# Patient Record
Sex: Female | Born: 1955 | Race: Black or African American | Hispanic: No | Marital: Married | Smoking: Never smoker
Health system: Southern US, Community
[De-identification: ages and names within clinical notes are randomized; demographics above are authoritative.]

## PROBLEM LIST (undated history)

## (undated) DIAGNOSIS — R42 Dizziness and giddiness: Secondary | ICD-10-CM

## (undated) DIAGNOSIS — E669 Obesity, unspecified: Secondary | ICD-10-CM

## (undated) DIAGNOSIS — N2889 Other specified disorders of kidney and ureter: Secondary | ICD-10-CM

## (undated) DIAGNOSIS — D649 Anemia, unspecified: Secondary | ICD-10-CM

## (undated) DIAGNOSIS — J40 Bronchitis, not specified as acute or chronic: Secondary | ICD-10-CM

## (undated) DIAGNOSIS — I10 Essential (primary) hypertension: Secondary | ICD-10-CM

## (undated) DIAGNOSIS — M1990 Unspecified osteoarthritis, unspecified site: Secondary | ICD-10-CM

## (undated) DIAGNOSIS — K760 Fatty (change of) liver, not elsewhere classified: Secondary | ICD-10-CM

## (undated) DIAGNOSIS — E1121 Type 2 diabetes mellitus with diabetic nephropathy: Secondary | ICD-10-CM

## (undated) HISTORY — DX: Type 2 diabetes mellitus with diabetic nephropathy: E11.21

## (undated) HISTORY — DX: Fatty (change of) liver, not elsewhere classified: K76.0

## (undated) HISTORY — DX: Bronchitis, not specified as acute or chronic: J40

## (undated) HISTORY — DX: Obesity, unspecified: E66.9

## (undated) HISTORY — DX: Other specified disorders of kidney and ureter: N28.89

## (undated) HISTORY — PX: BREAST SURGERY: SHX581

---
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
## 2004-04-24 ENCOUNTER — Emergency Department (HOSPITAL_COMMUNITY): Admission: EM | Admit: 2004-04-24 | Discharge: 2004-04-24 | Payer: Self-pay | Admitting: Emergency Medicine

## 2005-05-27 ENCOUNTER — Emergency Department (HOSPITAL_COMMUNITY): Admission: EM | Admit: 2005-05-27 | Discharge: 2005-05-27 | Payer: Self-pay | Admitting: Emergency Medicine

## 2006-05-01 ENCOUNTER — Emergency Department (HOSPITAL_COMMUNITY): Admission: EM | Admit: 2006-05-01 | Discharge: 2006-05-02 | Payer: Self-pay | Admitting: Emergency Medicine

## 2008-12-14 ENCOUNTER — Emergency Department (HOSPITAL_COMMUNITY): Admission: EM | Admit: 2008-12-14 | Discharge: 2008-12-14 | Payer: Self-pay | Admitting: Emergency Medicine

## 2009-05-04 ENCOUNTER — Emergency Department (HOSPITAL_COMMUNITY): Admission: EM | Admit: 2009-05-04 | Discharge: 2009-05-04 | Payer: Self-pay | Admitting: Emergency Medicine

## 2010-01-25 ENCOUNTER — Emergency Department (HOSPITAL_COMMUNITY): Admission: EM | Admit: 2010-01-25 | Discharge: 2010-01-26 | Payer: Self-pay | Admitting: Emergency Medicine

## 2010-05-18 LAB — DIFFERENTIAL
Eosinophils Absolute: 0.2 10*3/uL (ref 0.0–0.7)
Lymphocytes Relative: 41 % (ref 12–46)
Lymphs Abs: 3.7 10*3/uL (ref 0.7–4.0)
Monocytes Absolute: 0.6 10*3/uL (ref 0.1–1.0)
Neutro Abs: 4.4 10*3/uL (ref 1.7–7.7)

## 2010-05-18 LAB — URINALYSIS, ROUTINE W REFLEX MICROSCOPIC
Ketones, ur: NEGATIVE mg/dL
Protein, ur: NEGATIVE mg/dL
Urobilinogen, UA: 0.2 mg/dL (ref 0.0–1.0)

## 2010-05-18 LAB — CBC
MCH: 27.8 pg (ref 26.0–34.0)
MCHC: 33.4 g/dL (ref 30.0–36.0)
MCV: 83.2 fL (ref 78.0–100.0)
Platelets: 327 10*3/uL (ref 150–400)
RBC: 4.64 MIL/uL (ref 3.87–5.11)
RDW: 13.8 % (ref 11.5–15.5)

## 2010-05-18 LAB — COMPREHENSIVE METABOLIC PANEL
ALT: 27 U/L (ref 0–35)
Alkaline Phosphatase: 69 U/L (ref 39–117)
BUN: 22 mg/dL (ref 6–23)
Chloride: 101 mEq/L (ref 96–112)
GFR calc non Af Amer: 41 mL/min — ABNORMAL LOW (ref >60)
Sodium: 139 mEq/L (ref 135–145)
Total Bilirubin: 0.5 mg/dL (ref 0.3–1.2)

## 2010-05-18 LAB — URINE CULTURE
Colony Count: NO GROWTH
Culture  Setup Time: 201111221210
Culture: NO GROWTH

## 2010-05-18 LAB — URINE MICROSCOPIC-ADD ON

## 2010-05-18 LAB — POCT CARDIAC MARKERS

## 2010-05-26 LAB — BASIC METABOLIC PANEL
BUN: 14 mg/dL (ref 6–23)
Chloride: 103 mEq/L (ref 96–112)
Creatinine, Ser: 1.19 mg/dL (ref 0.4–1.2)
GFR calc Af Amer: 57 mL/min — ABNORMAL LOW (ref >60)
GFR calc non Af Amer: 47 mL/min — ABNORMAL LOW (ref >60)
Potassium: 4 mEq/L (ref 3.5–5.1)
Sodium: 139 mEq/L (ref 135–145)

## 2010-06-10 LAB — URINALYSIS, ROUTINE W REFLEX MICROSCOPIC
Glucose, UA: NEGATIVE mg/dL
Ketones, ur: NEGATIVE mg/dL
Leukocytes, UA: NEGATIVE
Protein, ur: 100 mg/dL — AB

## 2010-06-10 LAB — URINE MICROSCOPIC-ADD ON

## 2010-06-10 LAB — GLUCOSE, CAPILLARY: Glucose-Capillary: 136 mg/dL — ABNORMAL HIGH (ref 70–99)

## 2010-06-10 LAB — URINE CULTURE

## 2010-11-06 ENCOUNTER — Emergency Department (HOSPITAL_COMMUNITY)
Admission: EM | Admit: 2010-11-06 | Discharge: 2010-11-06 | Disposition: A | Discharge status: Home / Self Care | Payer: Self-pay | Attending: Emergency Medicine | Admitting: Emergency Medicine

## 2010-11-06 DIAGNOSIS — R51 Headache: Secondary | ICD-10-CM | POA: Insufficient documentation

## 2010-11-06 DIAGNOSIS — Z91199 Patient's noncompliance with other medical treatment and regimen due to unspecified reason: Secondary | ICD-10-CM | POA: Insufficient documentation

## 2010-11-06 DIAGNOSIS — Z9119 Patient's noncompliance with other medical treatment and regimen: Secondary | ICD-10-CM | POA: Insufficient documentation

## 2010-11-06 DIAGNOSIS — I10 Essential (primary) hypertension: Secondary | ICD-10-CM | POA: Insufficient documentation

## 2010-11-06 DIAGNOSIS — E119 Type 2 diabetes mellitus without complications: Secondary | ICD-10-CM | POA: Insufficient documentation

## 2010-11-06 HISTORY — DX: Essential (primary) hypertension: I10

## 2010-11-06 MED ORDER — LISINOPRIL-HYDROCHLOROTHIAZIDE 20-25 MG PO TABS: 1.0000 | ORAL_TABLET | Freq: Every day | ORAL | Status: DC

## 2010-11-06 MED ORDER — IBUPROFEN 600 MG PO TABS: 600.0000 mg | ORAL_TABLET | Freq: Three times a day (TID) | ORAL | Status: AC | PRN

## 2010-11-06 MED ORDER — IBUPROFEN 800 MG PO TABS
800.0000 mg | ORAL_TABLET | Freq: Once | ORAL | Status: AC
Start: 2010-11-06 — End: 2010-11-06
  Administered 2010-11-06: 800 mg via ORAL
  Filled 2010-11-06: qty 1

## 2010-11-06 NOTE — ED Notes (Signed)
Headache for 1 day

## 2010-11-12 NOTE — ED Provider Notes (Signed)
History     CSN: [id number] Arrival date & time: 11/06/2010  1:08 AM  Chief Complaint  Patient presents with  . Headache   HPI Comments: The patient presents for evaluation of headache that only hurts when she coughs.   She reports a dry, non-productive cough for several days without fever, chills, dyspnea, or chest pain. She also is requesting medication refills of her blood pressure medications.  Patient is a 55 y.o. female presenting with headaches. The history is provided by the patient.  Headache  This is a new problem. Episode onset: today. The problem occurs every few minutes. The problem has not changed since onset.The headache is associated with coughing. The pain is located in the bilateral, frontal, temporal, occipital and parietal region. The quality of the pain is described as dull. The pain is at a severity of 5/10. The pain is moderate. The pain does not radiate. Pertinent negatives include no anorexia, no fever, no malaise/fatigue, no chest pressure, no near-syncope, no orthopnea, no palpitations, no syncope, no shortness of breath, no nausea and no vomiting. She has tried nothing for the symptoms.    Past Medical History  Diagnosis Date  . Diabetes mellitus   . Hypertension     History reviewed. No pertinent past surgical history.  No family history on file.  History  Substance Use Topics  . Smoking status: Never Smoker   . Smokeless tobacco: Not on file  . Alcohol Use: No    OB History    Grav Para Term Preterm Abortions TAB SAB Ect Mult Living                  Review of Systems  Constitutional: Negative for fever, chills, malaise/fatigue, diaphoresis and appetite change.  HENT: Negative for hearing loss, ear pain, congestion, sore throat, facial swelling, rhinorrhea, trouble swallowing, neck pain, neck stiffness, dental problem, sinus pressure and tinnitus.   Eyes: Negative for photophobia, pain, discharge, redness and visual disturbance.  Respiratory:  Positive for cough. Negative for shortness of breath.   Cardiovascular: Negative.  Negative for palpitations, orthopnea, syncope and near-syncope.  Gastrointestinal: Negative for nausea, vomiting and anorexia.  Genitourinary: Negative.   Musculoskeletal: Negative for back pain.  Skin: Negative for rash.  Neurological: Positive for headaches. Negative for dizziness, seizures, syncope, light-headedness and numbness.  Psychiatric/Behavioral: Negative for confusion.    Physical Exam  BP 169/68  Pulse 78  Temp(Src) 97.8 F (36.6 C) (Oral)  Resp 20  Ht 5\' 5"  (1.651 m)  Wt 240 lb (108.863 kg)  BMI 39.94 kg/m2  SpO2 100%  Physical Exam  Constitutional: She is oriented to person, place, and time. She appears well-developed and well-nourished. No distress.  HENT:  Head: Normocephalic and atraumatic.  Right Ear: External ear normal.  Left Ear: External ear normal.  Mouth/Throat: Oropharynx is clear and moist. No oropharyngeal exudate.  Eyes: Conjunctivae and EOM are normal. Pupils are equal, round, and reactive to light. Right eye exhibits no nystagmus. Left eye exhibits no nystagmus.  Fundoscopic exam:      The right eye shows no papilledema.       The left eye shows no papilledema.  Neck: Normal range of motion, full passive range of motion without pain and phonation normal. Neck supple. Carotid bruit is not present.  Cardiovascular: Normal rate, regular rhythm, normal heart sounds and intact distal pulses.  Exam reveals no gallop and no friction rub.   No murmur heard. Pulmonary/Chest: Effort normal and breath sounds normal. No  respiratory distress. She has no wheezes. She has no rales.  Abdominal: Soft. Bowel sounds are normal. She exhibits no distension. There is no tenderness. There is no rebound and no guarding.  Musculoskeletal: Normal range of motion. She exhibits no edema and no tenderness.  Neurological: She is alert and oriented to person, place, and time. She has normal  reflexes. No cranial nerve deficit. She exhibits normal muscle tone. Coordination normal. GCS eye subscore is 4. GCS verbal subscore is 5. GCS motor subscore is 6.  Skin: Skin is warm and dry. No rash noted. She is not diaphoretic.  Psychiatric: She has a normal mood and affect. Her behavior is normal. Judgment and thought content normal.    ED Course  Procedures  MDM Tension headache, hypertensive headache, viral upper respiratory infection, bronchitis, hypertension, medication non-compliance.      Felisa Bonier, MD 11/12/10 [phone]

## 2011-08-20 ENCOUNTER — Encounter (HOSPITAL_COMMUNITY): Payer: Self-pay | Admitting: *Deleted

## 2011-08-20 ENCOUNTER — Emergency Department (HOSPITAL_COMMUNITY)
Admission: EM | Admit: 2011-08-20 | Discharge: 2011-08-21 | Disposition: A | Discharge status: Home / Self Care | Payer: Self-pay | Attending: Emergency Medicine | Admitting: Emergency Medicine

## 2011-08-20 DIAGNOSIS — E119 Type 2 diabetes mellitus without complications: Secondary | ICD-10-CM | POA: Insufficient documentation

## 2011-08-20 DIAGNOSIS — R739 Hyperglycemia, unspecified: Secondary | ICD-10-CM

## 2011-08-20 DIAGNOSIS — I10 Essential (primary) hypertension: Secondary | ICD-10-CM | POA: Insufficient documentation

## 2011-08-20 DIAGNOSIS — N289 Disorder of kidney and ureter, unspecified: Secondary | ICD-10-CM | POA: Insufficient documentation

## 2011-08-20 DIAGNOSIS — N390 Urinary tract infection, site not specified: Secondary | ICD-10-CM | POA: Insufficient documentation

## 2011-08-20 LAB — BASIC METABOLIC PANEL
Calcium: 9.8 mg/dL (ref 8.4–10.5)
GFR calc Af Amer: 37 mL/min — ABNORMAL LOW (ref >90)
GFR calc non Af Amer: 32 mL/min — ABNORMAL LOW (ref >90)
Glucose, Bld: 193 mg/dL — ABNORMAL HIGH (ref 70–99)
Sodium: 138 mEq/L (ref 135–145)

## 2011-08-20 LAB — URINALYSIS, ROUTINE W REFLEX MICROSCOPIC
Glucose, UA: NEGATIVE mg/dL
Ketones, ur: NEGATIVE mg/dL
Nitrite: POSITIVE — AB
Specific Gravity, Urine: >1.03 — ABNORMAL HIGH (ref 1.005–1.030)
pH: 6 (ref 5.0–8.0)

## 2011-08-20 LAB — CBC
HCT: 39 % (ref 36.0–46.0)
MCH: 26.5 pg (ref 26.0–34.0)
MCHC: 31.8 g/dL (ref 30.0–36.0)
MCV: 83.3 fL (ref 78.0–100.0)
RBC: 4.68 MIL/uL (ref 3.87–5.11)
RDW: 14.7 % (ref 11.5–15.5)

## 2011-08-20 LAB — URINE MICROSCOPIC-ADD ON

## 2011-08-20 NOTE — ED Provider Notes (Signed)
History     CSN: [id number]  Arrival date & time 08/20/11  2153   None     Chief Complaint  Patient presents with  . Dizziness    (Consider location/radiation/quality/duration/timing/severity/associated sxs/prior treatment) HPI Comments: Had an episode of dizziness this PM which occurred after standing from a sitting  And lasted less than one minute.  No accompanying diaphoresis, nausea or vomiting.  No CP.  No fever/chills, cough or earache.  States she has had frequent episodes  Like this for the past 4 years.  No confusion, aphasia or difficulty ambulating.  No head trauma.  The history is provided by the patient. No language interpreter was used.    Past Medical History  Diagnosis Date  . Diabetes mellitus   . Hypertension     History reviewed. No pertinent past surgical history.  No family history on file.  History  Substance Use Topics  . Smoking status: Never Smoker   . Smokeless tobacco: Not on file  . Alcohol Use: No    OB History    Grav Para Term Preterm Abortions TAB SAB Ect Mult Living                  Review of Systems  Constitutional: Negative for fever, chills and diaphoresis.  HENT: Negative for hearing loss, ear pain, trouble swallowing, neck pain, neck stiffness, tinnitus and ear discharge.   Eyes: Negative for visual disturbance.  Respiratory: Negative for cough, chest tightness and shortness of breath.   Gastrointestinal: Negative for nausea, vomiting and diarrhea.  Genitourinary: Negative for dysuria, urgency, frequency, hematuria and difficulty urinating.  Hematological: Does not bruise/bleed easily.  Psychiatric/Behavioral: Negative for confusion and decreased concentration. The patient is not nervous/anxious.   All other systems reviewed and are negative.    Allergies  Review of patient's allergies indicates no known allergies.  Home Medications   Current Outpatient Rx  Name Route Sig Dispense Refill  . ATENOLOL 100 MG PO TABS  Oral Take 100 mg by mouth daily.      Marland Kitchen LISINOPRIL-HYDROCHLOROTHIAZIDE 20-25 MG PO TABS Oral Take 1 tablet by mouth daily. 30 tablet 0  . PIOGLITAZONE HCL 45 MG PO TABS Oral Take 45 mg by mouth daily.      Marland Kitchen SITAGLIPTIN PHOSPHATE 100 MG PO TABS Oral Take 100 mg by mouth daily.      Marland Kitchen CIPROFLOXACIN HCL 500 MG PO TABS Oral Take 0.5 tablets (250 mg total) by mouth 2 (two) times daily. 14 tablet 0    BP 135/66  Pulse 80  Temp 98 F (36.7 C) (Oral)  Resp 20  Ht 5\' 5"  (1.651 m)  Wt 230 lb (104.327 kg)  BMI 38.27 kg/m2  SpO2 100%  Physical Exam  Nursing note and vitals reviewed. Constitutional: She is oriented to person, place, and time. She appears well-developed and well-nourished. No distress.  HENT:  Head: Normocephalic and atraumatic.  Eyes: EOM are normal. Pupils are equal, round, and reactive to light.  Neck: Trachea normal, normal range of motion and phonation normal. No JVD present. Carotid bruit is not present.  Cardiovascular: Normal rate, regular rhythm and normal heart sounds.   Pulmonary/Chest: Effort normal and breath sounds normal. No accessory muscle usage. Not tachypneic. No respiratory distress. She has no decreased breath sounds. She has no wheezes. She has no rhonchi. She has no rales. She exhibits no tenderness.  Abdominal: Soft. She exhibits no distension. There is no tenderness.  Musculoskeletal: Normal range of motion.  Neurological:  She is alert and oriented to person, place, and time. She has normal strength. No cranial nerve deficit. Coordination and gait normal. GCS eye subscore is 4. GCS verbal subscore is 5. GCS motor subscore is 6.  Reflex Scores:      Bicep reflexes are 2+ on the right side and 2+ on the left side.      Brachioradialis reflexes are 2+ on the right side and 2+ on the left side.      Patellar reflexes are 2+ on the right side and 2+ on the left side.      Achilles reflexes are 2+ on the right side and 2+ on the left side. Skin: Skin is warm  and dry.  Psychiatric: She has a normal mood and affect. Judgment normal.    ED Course  Procedures (including critical care time)  Labs Reviewed  CBC - Abnormal; Notable for the following:    Platelets 439 (*)     All other components within normal limits  BASIC METABOLIC PANEL - Abnormal; Notable for the following:    Glucose, Bld 193 (*)     BUN 27 (*)     Creatinine, Ser 1.70 (*)     GFR calc non Af Amer 32 (*)     GFR calc Af Amer 37 (*)     All other components within normal limits  URINALYSIS, ROUTINE W REFLEX MICROSCOPIC - Abnormal; Notable for the following:    APPearance CLOUDY (*)     Specific Gravity, Urine >1.030 (*)     Hgb urine dipstick TRACE (*)     Nitrite POSITIVE (*)     Leukocytes, UA MODERATE (*)     All other components within normal limits  URINE MICROSCOPIC-ADD ON - Abnormal; Notable for the following:    Bacteria, UA MANY (*)     All other components within normal limits  URINE CULTURE   No results found.   1. UTI (urinary tract infection)   2. Renal insufficiency   3. Hyperglycemia       MDM  rx-cipro 250 mg BID, 14 Drink plenty of fluids.   F/u with your PCP at [facility] on Monday.        Worthy Rancher, PA 08/21/11 [phone]

## 2011-08-20 NOTE — ED Notes (Signed)
Patient states she has been dizzy for 3 years . She [facility] to health dept[facility]  And states she was given antivert at a previous visit here in er

## 2011-08-20 NOTE — ED Notes (Signed)
C/o dizziness, states she has a history of dizziness but it ia worse today

## 2011-08-21 MED ORDER — CIPROFLOXACIN HCL 500 MG PO TABS: 250.0000 mg | ORAL_TABLET | Freq: Two times a day (BID) | ORAL | Status: AC

## 2011-08-21 MED ORDER — CIPROFLOXACIN HCL 250 MG PO TABS
500.0000 mg | ORAL_TABLET | Freq: Once | ORAL | Status: AC
  Administered 2011-08-21: 500 mg via ORAL
  Filled 2011-08-21: qty 2

## 2011-08-21 NOTE — Discharge Instructions (Signed)
Chronic Renal Insufficiency Chronic renal insufficiency (also called kidney failure) occurs when there is kidney damage done. The damage prevents the kidneys from working like they should.  The kidneys do many important things. They:  Filter waste out of the blood.   Regulate the amount of water and various salts in the blood stream.   Produce chemicals that:   Prompt the bone marrow to make red blood cells.   Regulate blood pressure.   Keep calcium in balance throughout the bones and the body.  When the kidneys are damaged, they can no longer filter waste products out of the blood. These substances build up in the blood, causing illness.  CAUSES   Diabetes.   High blood pressure.   Glomerular diseases: Conditions that damage the tiny blood vessels (glomeruli) within the kidneys, such as:   Membranous nephropathy.   IgA nephropathy.   Focal segmental glomerulosclerosis.   Poisons (such as overdoses or misuse of acetaminophen or NSAIDS, or exposure to other toxic substances).   Kidney injuries.   Kidney cancer or cancer that spreads to the kidney.   Medications such as NSAIDs. These problems are rare.   Kidney stones.   Alport disease.   Polycystic kidneys.  SYMPTOMS  Most people do not notice symptoms of kidney failure until their kidney function drops below about 30-40% of normal. Symptoms can include:  Weakness.   Tiredness.   Frequent urination.   Intense need to urinate.   Excess bruising.   Low urine production.   Blood in the urine.   Pain in the kidney area.   Feeling sick to your stomach (nausea).   Vomiting.   Unusual bleeding.   Numbness in hands and feet.   Swelling in legs, arms and face.   Confusion.  DIAGNOSIS  Your caregiver will look for signs of kidney failure. Tests to diagnose kidney failure may include:  Urine tests: May reveal the presence of blood, protein or sugar.   Blood tests: May show low red blood cell count  (anemia) or high levels of waste products (BUN and creatinine) that are normally filtered out of the bloodstream by the kidneys.   Imaging tests - These are tests that create pictures of the organs inside the abdomen, such as the kidneys. They may reveal masses growing in the kidneys or blockages to the flow of urine. Possible imaging tests may include:   Ultrasound.   CT scan.   MRI.   Intravenous pyelogram or IVP. This is a test that involves injecting dye into the bloodstream and then taking a series of x-rays of the kidneys. This allows the kidneys and other parts of the urinary system to be viewed more clearly.   Kidney biopsy - A small sample of kidney is removed using a special needle. The sample is examined for abnormalities under a microscope.  TREATMENT  Chronic kidney failure cannot usually be cured. The various symptoms are treated, and measures are taken to avoid further kidney damage. Treatment for mild to moderate kidney failure may include:  Medication for high blood pressure.   Good control of diabetes.   Medication and diet change to improve anemia.   A low-sodium, low-potassium, low-protein and/or low-cholesterol diet.   Limiting the quantity of liquids in the diet.  Treatment for more severe kidney failure may require:  Dialysis - Mechanical methods of filtering the blood.   Kidney transplant - An operation that removes the diseased kidney and replaces it with a donated kidney.  HOME  CARE INSTRUCTIONS   Take medication as told by your caregiver.   Quit smoking if you are a smoker. Talk to your caregiver about a smoking cessation program.   Follow your prescribed diet.   If you are prescribed vitamins, take them as told.  SEEK IMMEDIATE MEDICAL CARE IF:  You start to produce less urine.   You notice blood in your urine.   You have increased pain.   You have increased weakness, fatigue or confusion.   You notice new swelling.   You develop a  fever.   You feel that you are having side effects of medicines prescribed.  Document Released: 12/01/2007 Document Revised: 02/10/2011 Document Reviewed: 03/15/2010 [facility] Patient Information 2012 [place], [place].Urinary Tract Infection Infections of the urinary tract can start in several places. A bladder infection (cystitis), a kidney infection (pyelonephritis), and a prostate infection (prostatitis) are different types of urinary tract infections (UTIs). They usually get better if treated with medicines (antibiotics) that kill germs. Take all the medicine until it is gone. You or your child may feel better in a few days, but TAKE ALL MEDICINE or the infection may not respond and may become more difficult to treat. HOME CARE INSTRUCTIONS   Drink enough water and fluids to keep the urine clear or pale yellow. Cranberry juice is especially recommended, in addition to large amounts of water.   Avoid caffeine, tea, and carbonated beverages. They tend to irritate the bladder.   Alcohol may irritate the prostate.   Only take over-the-counter or prescription medicines for pain, discomfort, or fever as directed by your caregiver.  To prevent further infections:  Empty the bladder often. Avoid holding urine for long periods of time.   After a bowel movement, women should cleanse from front to back. Use each tissue only once.   Empty the bladder before and after sexual intercourse.  FINDING OUT THE RESULTS OF YOUR TEST Not all test results are available during your visit. If your or your child's test results are not back during the visit, make an appointment with your caregiver to find out the results. Do not assume everything is normal if you have not heard from your caregiver or the medical facility. It is important for you to follow up on all test results. SEEK MEDICAL CARE IF:   There is back pain.   Your baby is older than 3 months with a rectal temperature of 100.5 F (38.1 C) or higher  for more than 1 day.   Your or your child's problems (symptoms) are no better in 3 days. Return sooner if you or your child is getting worse.  SEEK IMMEDIATE MEDICAL CARE IF:   There is severe back pain or lower abdominal pain.   You or your child develops chills.   You have a fever.   Your baby is older than 3 months with a rectal temperature of 102 F (38.9 C) or higher.   Your baby is 78 months old or younger with a rectal temperature of 100.4 F (38 C) or higher.   There is nausea or vomiting.   There is continued burning or discomfort with urination.  MAKE SURE YOU:   Understand these instructions.   Will watch your condition.   Will get help right away if you are not doing well or get worse.  Document Released: 12/01/2004 Document Revised: 02/10/2011 Document Reviewed: 07/06/2006 [facility] Patient Information 2012 [place], [place].   Take the cipro as directed and drink plenty of fluids.  Call your MD at the health dept. On Monday and  Let her know that you came to ED today.  Your BUN and creatinine are more elevated than the last time you were seen herr.

## 2011-08-21 NOTE — ED Provider Notes (Signed)
Medical screening examination/treatment/procedure(s) were performed by non-physician practitioner and as supervising physician I was immediately available for consultation/collaboration.  Nicoletta Dress. Colon Branch, MD 08/21/11 [phone]

## 2011-08-23 LAB — URINE CULTURE

## 2011-08-24 NOTE — ED Notes (Signed)
+   urine Patient treated with Cipro-sensitive to same-chart appended per protocol. 

## 2011-11-11 ENCOUNTER — Encounter (HOSPITAL_COMMUNITY): Payer: Self-pay | Admitting: *Deleted

## 2011-11-11 ENCOUNTER — Emergency Department (HOSPITAL_COMMUNITY)
Admission: EM | Admit: 2011-11-11 | Discharge: 2011-11-11 | Disposition: A | Discharge status: Home / Self Care | Payer: Self-pay | Attending: Emergency Medicine | Admitting: Emergency Medicine

## 2011-11-11 DIAGNOSIS — M5432 Sciatica, left side: Secondary | ICD-10-CM

## 2011-11-11 DIAGNOSIS — M5430 Sciatica, unspecified side: Secondary | ICD-10-CM | POA: Insufficient documentation

## 2011-11-11 DIAGNOSIS — I10 Essential (primary) hypertension: Secondary | ICD-10-CM | POA: Insufficient documentation

## 2011-11-11 DIAGNOSIS — E119 Type 2 diabetes mellitus without complications: Secondary | ICD-10-CM | POA: Insufficient documentation

## 2011-11-11 MED ORDER — HYDROCODONE-ACETAMINOPHEN 5-325 MG PO TABS: ORAL_TABLET | ORAL | Status: AC

## 2011-11-11 MED ORDER — CYCLOBENZAPRINE HCL 10 MG PO TABS: 10.0000 mg | ORAL_TABLET | Freq: Three times a day (TID) | ORAL | Status: AC | PRN

## 2011-11-11 NOTE — ED Provider Notes (Signed)
Medical screening examination/treatment/procedure(s) were performed by non-physician practitioner and as supervising physician I was immediately available for consultation/collaboration.   Esthela Brandner L Ryian Lynde, MD 11/11/11 2234 

## 2011-11-11 NOTE — ED Notes (Signed)
Pain lt hip and down leg x 2 weeks, No injury. Alert,

## 2011-11-11 NOTE — ED Provider Notes (Signed)
History     CSN: [id number]  Arrival date & time 11/11/11  1633   First MD Initiated Contact with Patient 11/11/11 1658      Chief Complaint  Patient presents with  . Hip Pain    (Consider location/radiation/quality/duration/timing/severity/associated sxs/prior treatment) HPI Comments: Patient c/o pain to her left hip/buttocks for 2 weeks.  States she noticed the pain after moving a heavy sofa.  Describes the pain as sharp and radiating from the hip down to her ankle.  States the pain improves when supine and worsens with standing, bending or excessive sitting.  She denies urinary symptoms, perineal numbness, extremity weakness or numbness or incontinence.    Patient is a 56 y.o. female presenting with hip pain. The history is provided by the patient.  Hip Pain This is a new problem. The current episode started 1 to 4 weeks ago. The problem occurs intermittently. The problem has been unchanged. Associated symptoms include arthralgias. Pertinent negatives include no abdominal pain, chest pain, chills, congestion, coughing, fever, headaches, joint swelling, myalgias, nausea, neck pain, numbness, rash, sore throat, urinary symptoms, vomiting or weakness. The symptoms are aggravated by standing, twisting, walking and bending. She has tried NSAIDs for the symptoms. The treatment provided moderate relief.    Past Medical History  Diagnosis Date  . Diabetes mellitus   . Hypertension     History reviewed. No pertinent past surgical history.  No family history on file.  History  Substance Use Topics  . Smoking status: Never Smoker   . Smokeless tobacco: Not on file  . Alcohol Use: No    OB History    Grav Para Term Preterm Abortions TAB SAB Ect Mult Living                  Review of Systems  Constitutional: Negative for fever and chills.  HENT: Negative for congestion, sore throat and neck pain.   Respiratory: Negative for cough and shortness of breath.   Cardiovascular:  Negative for chest pain.  Gastrointestinal: Negative for nausea, vomiting, abdominal pain and constipation.  Genitourinary: Negative for dysuria, frequency, hematuria, flank pain, decreased urine volume, vaginal bleeding, difficulty urinating and pelvic pain.       No perineal numbness or incontinence of urine or feces  Musculoskeletal: Positive for arthralgias. Negative for myalgias and joint swelling.  Skin: Negative for rash and wound.  Neurological: Negative for weakness, numbness and headaches.  All other systems reviewed and are negative.    Allergies  Review of patient's allergies indicates no known allergies.  Home Medications   Current Outpatient Rx  Name Route Sig Dispense Refill  . ATENOLOL 100 MG PO TABS Oral Take 100 mg by mouth daily.      Marland Kitchen LISINOPRIL-HYDROCHLOROTHIAZIDE 20-25 MG PO TABS Oral Take 1 tablet by mouth daily. 30 tablet 0  . PIOGLITAZONE HCL 45 MG PO TABS Oral Take 45 mg by mouth daily.      Marland Kitchen SITAGLIPTIN PHOSPHATE 100 MG PO TABS Oral Take 100 mg by mouth daily.        BP 160/98  Pulse 60  Temp 97.8 F (36.6 C) (Oral)  Resp 20  Ht 5\' 5"  (1.651 m)  Wt 230 lb (104.327 kg)  BMI 38.27 kg/m2  SpO2 100%  Physical Exam  Nursing note and vitals reviewed. Constitutional: She is oriented to person, place, and time. She appears well-developed and well-nourished. No distress.  HENT:  Head: Normocephalic and atraumatic.  Neck: Normal range of motion. Neck supple.  Cardiovascular: Normal rate, regular rhythm and intact distal pulses.   No murmur heard. Pulmonary/Chest: Effort normal and breath sounds normal.  Musculoskeletal: She exhibits tenderness. She exhibits no edema.       Left hip: She exhibits tenderness. She exhibits normal range of motion, normal strength, no bony tenderness, no swelling, no crepitus, no deformity and no laceration.       Lumbar back: She exhibits tenderness, bony tenderness and pain. She exhibits normal range of motion, no  swelling, no deformity, no laceration and normal pulse.       Back:       Legs: Neurological: She is alert and oriented to person, place, and time. No cranial nerve deficit or sensory deficit. She exhibits normal muscle tone. Coordination and gait normal.  Reflex Scores:      Patellar reflexes are 2+ on the right side and 2+ on the left side.      Achilles reflexes are 2+ on the right side and 2+ on the left side. Skin: Skin is warm and dry.    ED Course  Procedures (including critical care time)  Labs Reviewed - No data to display      MDM     Patient has ttp of the left lumbar paraspinal muscles.  No focal neuro deficits on exam, no saddle anesthesias.  Ambulates with a steady gait.   Left hip pain is likely related to sciatica.  Pt prefers not to have imaging performed at this time.  She agrees to return here if the symptoms are not improving.  I doubt emergent neurological or infectious process.  The patient appears reasonably screened and/or stabilized for discharge and I doubt any other medical condition or other [facility] requiring further screening, evaluation, or treatment in the ED at this time prior to discharge.   Prescribed: norco #24 flexeril   Ashleymarie Granderson L. Keyshawna Prouse, [place] 11/11/11 1726

## 2011-11-11 NOTE — ED Notes (Signed)
C/o left hip pain x 2 weeks; denies injury; states pain is radiating down LLE; reports pain somewhat relieved by aspirin

## 2011-12-19 ENCOUNTER — Encounter (HOSPITAL_COMMUNITY): Payer: Self-pay | Admitting: *Deleted

## 2011-12-19 ENCOUNTER — Emergency Department (HOSPITAL_COMMUNITY): Payer: Self-pay

## 2011-12-19 ENCOUNTER — Emergency Department (HOSPITAL_COMMUNITY)
Admission: EM | Admit: 2011-12-19 | Discharge: 2011-12-19 | Disposition: A | Discharge status: Home / Self Care | Payer: Self-pay | Attending: Emergency Medicine | Admitting: Emergency Medicine

## 2011-12-19 DIAGNOSIS — IMO0001 Reserved for inherently not codable concepts without codable children: Secondary | ICD-10-CM | POA: Insufficient documentation

## 2011-12-19 DIAGNOSIS — Z7982 Long term (current) use of aspirin: Secondary | ICD-10-CM | POA: Insufficient documentation

## 2011-12-19 DIAGNOSIS — Z79899 Other long term (current) drug therapy: Secondary | ICD-10-CM | POA: Insufficient documentation

## 2011-12-19 DIAGNOSIS — I10 Essential (primary) hypertension: Secondary | ICD-10-CM | POA: Insufficient documentation

## 2011-12-19 DIAGNOSIS — M25569 Pain in unspecified knee: Secondary | ICD-10-CM | POA: Insufficient documentation

## 2011-12-19 DIAGNOSIS — E119 Type 2 diabetes mellitus without complications: Secondary | ICD-10-CM | POA: Insufficient documentation

## 2011-12-19 DIAGNOSIS — M79609 Pain in unspecified limb: Secondary | ICD-10-CM | POA: Insufficient documentation

## 2011-12-19 DIAGNOSIS — M5430 Sciatica, unspecified side: Secondary | ICD-10-CM | POA: Insufficient documentation

## 2011-12-19 DIAGNOSIS — M25559 Pain in unspecified hip: Secondary | ICD-10-CM | POA: Insufficient documentation

## 2011-12-19 MED ORDER — MELOXICAM 7.5 MG PO TABS: ORAL_TABLET | ORAL | Status: DC

## 2011-12-19 MED ORDER — OXYCODONE-ACETAMINOPHEN 5-325 MG PO TABS
1.0000 | ORAL_TABLET | Freq: Once | ORAL | Status: AC
  Administered 2011-12-19: 1 via ORAL
  Filled 2011-12-19: qty 1

## 2011-12-19 MED ORDER — OXYCODONE-ACETAMINOPHEN 5-325 MG PO TABS: 1.0000 | ORAL_TABLET | ORAL | Status: AC | PRN

## 2011-12-19 NOTE — ED Notes (Signed)
Pt c/p left hip pain that started 11/11/2011, was seen in er for same on that day, states that the pain has not gotten any better and has become worse over the past few days, denies any injury

## 2011-12-21 NOTE — ED Provider Notes (Signed)
History     CSN: [id number]  Arrival date & time 12/19/11  4098   First MD Initiated Contact with Patient 12/19/11 1028      Chief Complaint  Patient presents with  . Hip Pain    (Consider location/radiation/quality/duration/timing/severity/associated sxs/prior treatment) HPI Comments: Patient c/o persistent pain to her left buttocks and hip for over one month.  States her symptoms began after lifting a sofa.  She was seen here and treated at the time of onset.  States she taken the pain medication prescribed and pain improved somewhat, but has never resolved. She has not followed up with anyone.  She denies numbness or weakness or the lower extremities, incontinence, saddle anesthesia's, dysuria, or abdominal pain.     Patient is a 56 y.o. female presenting with back pain. The history is provided by the patient.  Back Pain  This is a chronic problem. The current episode started more than 1 week ago. The problem occurs constantly. The problem has not changed since onset.The pain is associated with lifting heavy objects. The pain is present in the lumbar spine and sacro-iliac joint (left buttocks and hip). The pain radiates to the left thigh, left knee and left foot. The pain is moderate. The symptoms are aggravated by bending, twisting and certain positions. Associated symptoms include leg pain. Pertinent negatives include no chest pain, no fever, no numbness, no abdominal pain, no abdominal swelling, no bowel incontinence, no perianal numbness, no bladder incontinence, no dysuria, no pelvic pain, no paresthesias, no paresis, no tingling and no weakness. She has tried analgesics, muscle relaxants, heat and ice for the symptoms. The treatment provided mild relief.    Past Medical History  Diagnosis Date  . Diabetes mellitus   . Hypertension     History reviewed. No pertinent past surgical history.  No family history on file.  History  Substance Use Topics  . Smoking status: Never  Smoker   . Smokeless tobacco: Not on file  . Alcohol Use: No    OB History    Grav Para Term Preterm Abortions TAB SAB Ect Mult Living                  Review of Systems  Constitutional: Negative for fever, activity change and appetite change.  Respiratory: Negative for chest tightness and shortness of breath.   Cardiovascular: Negative for chest pain.  Gastrointestinal: Negative for vomiting, abdominal pain, constipation and bowel incontinence.  Genitourinary: Negative for bladder incontinence, dysuria, hematuria, flank pain, decreased urine volume, difficulty urinating and pelvic pain.       No perineal numbness or incontinence of urine or feces  Musculoskeletal: Positive for back pain. Negative for joint swelling.  Skin: Negative for rash.  Neurological: Negative for dizziness, tingling, weakness, numbness and paresthesias.  All other systems reviewed and are negative.    Allergies  Review of patient's allergies indicates no known allergies.  Home Medications   Current Outpatient Rx  Name Route Sig Dispense Refill  . ASPIRIN 325 MG PO TABS Oral Take 650 mg by mouth every 6 (six) hours as needed. Pain.    . ATENOLOL 100 MG PO TABS Oral Take 100 mg by mouth daily.      . IBUPROFEN 200 MG PO TABS Oral Take 200 mg by mouth every 6 (six) hours as needed. Pain.    Marland Kitchen LISINOPRIL-HYDROCHLOROTHIAZIDE 20-25 MG PO TABS Oral Take 1 tablet by mouth daily.    Marland Kitchen NAPROXEN SODIUM 220 MG PO TABS Oral Take  220 mg by mouth 2 (two) times daily as needed. Pain.    Marland Kitchen PIOGLITAZONE HCL 45 MG PO TABS Oral Take 45 mg by mouth daily.      Marland Kitchen SITAGLIPTIN PHOSPHATE 100 MG PO TABS Oral Take 100 mg by mouth daily.      . MELOXICAM 7.5 MG PO TABS  One tablet po BID prn pain.  Take with food 20 tablet 0  . OXYCODONE-ACETAMINOPHEN 5-325 MG PO TABS Oral Take 1 tablet by mouth every 4 (four) hours as needed for pain. 20 tablet 0    BP 134/95  Pulse 59  Temp 97.8 F (36.6 C)  Resp 20  Ht 5\' 5"  (1.651 m)   Wt 230 lb (104.327 kg)  BMI 38.27 kg/m2  SpO2 100%  LMP 11/25/2011  Physical Exam  Nursing note and vitals reviewed. Constitutional: She is oriented to person, place, and time. She appears well-developed and well-nourished. No distress.  HENT:  Head: Normocephalic and atraumatic.  Neck: Normal range of motion. Neck supple.  Cardiovascular: Normal rate, regular rhythm and intact distal pulses.   No murmur heard. Pulmonary/Chest: Effort normal and breath sounds normal.  Musculoskeletal: She exhibits tenderness. She exhibits no edema.       Left hip: She exhibits tenderness. She exhibits normal range of motion, normal strength, no bony tenderness, no swelling, no crepitus, no deformity and no laceration.       Lumbar back: She exhibits tenderness and pain. She exhibits normal range of motion, no swelling, no deformity, no laceration and normal pulse.       Back:       Legs:      Localized ttp of the left lumbar paraspinal muscles and SI joint space.  Pain to left hip reproduced with rotation of the hip and full flexion.  DP pulses are brisk and equal bilaterally, distal sensation intact, no calf pain or LE edema.  Neurological: She is alert and oriented to person, place, and time. No cranial nerve deficit or sensory deficit. She exhibits normal muscle tone. Coordination and gait normal.  Reflex Scores:      Patellar reflexes are 2+ on the right side and 2+ on the left side.      Achilles reflexes are 2+ on the right side and 2+ on the left side. Skin: Skin is warm and dry.    ED Course  Procedures (including critical care time)  Labs Reviewed - No data to display Dg Lumbar Spine Complete  12/19/2011  *RADIOLOGY REPORT*  Clinical Data: Left hip pain.  No known injury.  LUMBAR SPINE - COMPLETE 4+ VIEW  Comparison: None.  Findings: There is normal alignment of the lumbar spine.  No evidence for acute fracture or dislocation.  No worrisome lytic or blastic lesions are identified.  No  evidence for spondylolisthesis or spondylolysis.  IMPRESSION: No evidence for acute  abnormality.   Original Report Authenticated By: Patterson Hammersmith, M.D.    Dg Hip Complete Left  12/19/2011  *RADIOLOGY REPORT*  Clinical Data: Hip pain for 1 month.  Pain in the leg.  No known injury.  LEFT HIP - COMPLETE 2+ VIEW  Comparison: None.  Findings: AP and lateral views are performed, showing no evidence for acute fracture or dislocation.  No worrisome lytic or blastic lesions are identified.  Regional bowel gas pattern is nonobstructive.  IMPRESSION: No evidence for acute  abnormality.   Original Report Authenticated By: Patterson Hammersmith, M.D.      1. Sciatica  MDM    Previous ED chart reviewed by me.  Patient ambulates with a steady gait.  No focal neuro deficits on exam.  Doubt emergent neurological process.  Pt agrees to f/u with her PMD.  The patient appears reasonably screened and/or stabilized for discharge and I doubt any other medical condition or other [facility] requiring further screening, evaluation, or treatment in the ED at this time prior to discharge.   Prescribed:  mobic Percocet #20       Shigeru Lampert L. [place], [place] 12/21/11 [phone]

## 2012-01-05 NOTE — ED Provider Notes (Signed)
Medical screening examination/treatment/procedure(s) were performed by non-physician practitioner and as supervising physician I was immediately available for consultation/collaboration.   Gwyneth Sprout, MD 01/05/12 2055

## 2012-04-20 ENCOUNTER — Encounter (HOSPITAL_COMMUNITY): Payer: Self-pay | Admitting: *Deleted

## 2012-04-20 ENCOUNTER — Emergency Department (HOSPITAL_COMMUNITY)
Admission: EM | Admit: 2012-04-20 | Discharge: 2012-04-20 | Disposition: A | Discharge status: Home / Self Care | Payer: Self-pay | Attending: Emergency Medicine | Admitting: Emergency Medicine

## 2012-04-20 DIAGNOSIS — R42 Dizziness and giddiness: Secondary | ICD-10-CM

## 2012-04-20 DIAGNOSIS — Z7982 Long term (current) use of aspirin: Secondary | ICD-10-CM | POA: Insufficient documentation

## 2012-04-20 DIAGNOSIS — Z79899 Other long term (current) drug therapy: Secondary | ICD-10-CM | POA: Insufficient documentation

## 2012-04-20 DIAGNOSIS — R110 Nausea: Secondary | ICD-10-CM | POA: Insufficient documentation

## 2012-04-20 DIAGNOSIS — I10 Essential (primary) hypertension: Secondary | ICD-10-CM | POA: Insufficient documentation

## 2012-04-20 DIAGNOSIS — E119 Type 2 diabetes mellitus without complications: Secondary | ICD-10-CM | POA: Insufficient documentation

## 2012-04-20 LAB — URINALYSIS, ROUTINE W REFLEX MICROSCOPIC
Bilirubin Urine: NEGATIVE
Glucose, UA: NEGATIVE mg/dL
Ketones, ur: NEGATIVE mg/dL
Leukocytes, UA: NEGATIVE
Nitrite: NEGATIVE
Protein, ur: NEGATIVE mg/dL
Specific Gravity, Urine: <1.005 — ABNORMAL LOW (ref 1.005–1.030)
Urobilinogen, UA: 0.2 mg/dL (ref 0.0–1.0)
pH: 5.5 (ref 5.0–8.0)

## 2012-04-20 LAB — URINE MICROSCOPIC-ADD ON

## 2012-04-20 MED ORDER — LORAZEPAM 1 MG PO TABS: 1.0000 mg | ORAL_TABLET | Freq: Two times a day (BID) | ORAL | Status: DC | PRN

## 2012-04-20 MED ORDER — MECLIZINE HCL 12.5 MG PO TABS
25.0000 mg | ORAL_TABLET | Freq: Once | ORAL | Status: AC
  Administered 2012-04-20: 25 mg via ORAL
  Filled 2012-04-20: qty 2

## 2012-04-20 MED ORDER — LORAZEPAM 1 MG PO TABS
1.0000 mg | ORAL_TABLET | Freq: Once | ORAL | Status: AC
  Administered 2012-04-20: 1 mg via ORAL
  Filled 2012-04-20: qty 1

## 2012-04-20 MED ORDER — MECLIZINE HCL 50 MG PO TABS: 25.0000 mg | ORAL_TABLET | Freq: Three times a day (TID) | ORAL | Status: DC | PRN

## 2012-04-20 NOTE — ED Notes (Signed)
Pt states has been dizzy for several weeks, worse last few days on standing. Nausea accompanies dizziness. Denies weakness. Pt wants urine checked.

## 2012-04-20 NOTE — ED Provider Notes (Signed)
History     This chart was scribed for Donnetta Hutching, MD, MD by Smitty Pluck, ED Scribe. The patient was seen in room APA11/AP[facility]11 and the patient's care was started at 10:28 PM.   CSN: [id number]  Arrival date & time 04/20/12  2036      Chief Complaint  Patient presents with  . Dizziness    progressively worse for couple of weeks  . Nausea    The history is provided by the patient and medical records. No language interpreter was used.   Brooke [name] is a 57 y.o. female with hx of DM and HTN who presents to the Emergency Department complaining of intermittent, moderate dizziness that had been ongoing for several weeks worsening within past 3 weeks. She states symptoms are worsened when she stands and when leaning over. She mentions that she thought the dizziness was due to DM or HTN but her CGB was about 200 when she checked at home (within normal range for pt). Pt's BP in ED was 165/74. She states that she has taken her daily medications (lisinopril-HCTZ and atenolol) as instructed. She reports that she has had similar symptoms in the past and was diagnosed with kidney infection. She also states that she has had vertigo in the past.  She reports having intermittent, mild vaginal discharge but it is usual. Pt denies ear pain, dysuria, back pain, fever, chills, nausea, vomiting, diarrhea, weakness, cough, SOB and any other pain.    [facility]   Past Medical History  Diagnosis Date  . Diabetes mellitus   . Hypertension     History reviewed. No pertinent past surgical history.  History reviewed. No pertinent family history.  History  Substance Use Topics  . Smoking status: Never Smoker   . Smokeless tobacco: Not on file  . Alcohol Use: No    OB History   Grav Para Term Preterm Abortions TAB SAB Ect Mult Living                  Review of Systems 10 Systems reviewed and all are negative for acute change except as noted in the  HPI.   Allergies  Review of patient's allergies indicates no known allergies.  Home Medications   Current Outpatient Rx  Name  Route  Sig  Dispense  Refill  . aspirin 325 MG tablet   Oral   Take 650 mg by mouth every other day. Pain.         Marland Kitchen atenolol (TENORMIN) 100 MG tablet   Oral   Take 100 mg by mouth every morning.          . Cyanocobalamin (VITAMIN B-12 CR PO)   Oral   Take 1 capsule by mouth daily.         . fish oil-omega-3 fatty acids 1000 MG capsule   Oral   Take 1-3 g by mouth daily.         Marland Kitchen lisinopril-hydrochlorothiazide (PRINZIDE,ZESTORETIC) 20-25 MG per tablet   Oral   Take 1 tablet by mouth daily.         . methylcellulose (ARTIFICIAL TEARS) 1 % ophthalmic solution   Both Eyes   Place 1 drop into both eyes as needed.         . pioglitazone (ACTOS) 45 MG tablet   Oral   Take 45 mg by mouth daily.           . pravastatin (PRAVACHOL) 40 MG tablet  Oral   Take 40 mg by mouth every evening.         . sitaGLIPtin (JANUVIA) 100 MG tablet   Oral   Take 100 mg by mouth daily.           . vitamin E 400 UNIT capsule   Oral   Take 400 Units by mouth daily.           BP 154/78  Pulse 69  Temp(Src) 97.8 F (36.6 C) (Oral)  Ht 5\' 5"  (1.651 m)  Wt 230 lb (104.327 kg)  BMI 38.27 kg/m2  SpO2 99%  LMP 03/16/2012  Physical Exam  Nursing note and vitals reviewed. Constitutional: She is oriented to person, place, and time. She appears well-developed and well-nourished.  HENT:  Head: Normocephalic and atraumatic.  Eyes: Conjunctivae and EOM are normal. Pupils are equal, round, and reactive to light.  Neck: Normal range of motion. Neck supple.  Cardiovascular: Normal rate, regular rhythm and normal heart sounds.   Pulmonary/Chest: Effort normal and breath sounds normal.  Abdominal: Soft. Bowel sounds are normal.  Musculoskeletal: Normal range of motion.  Neurological: She is alert and oriented to person, place, and time.  Skin:  Skin is warm and dry.  Psychiatric: She has a normal mood and affect.    ED Course  Procedures (including critical care time) DIAGNOSTIC STUDIES: Oxygen Saturation is 99% on room air, normal by my interpretation.    COORDINATION OF CARE: 10:37 PM Discussed ED treatment with pt and pt agrees. (Order Ativan and Antivert)      Labs Reviewed  URINALYSIS, ROUTINE W REFLEX MICROSCOPIC   No results found.   No diagnosis found.    MDM  No obvious neuro deficits. Patient is ambulatory. She is alert and oriented x3.  rx meclizine 25 mg #30 and Ativan 1 mg #20.  She has been instructed to return if symptoms worsen      I personally performed the services described in this documentation, which was scribed in my presence. The recorded information has been reviewed and is accurate.    Donnetta Hutching, MD 04/20/12 2303

## 2012-09-02 ENCOUNTER — Emergency Department (HOSPITAL_COMMUNITY)
Admission: EM | Admit: 2012-09-02 | Discharge: 2012-09-02 | Disposition: A | Discharge status: Home / Self Care | Payer: Self-pay | Attending: Emergency Medicine | Admitting: Emergency Medicine

## 2012-09-02 ENCOUNTER — Encounter (HOSPITAL_COMMUNITY): Payer: Self-pay | Admitting: *Deleted

## 2012-09-02 DIAGNOSIS — I10 Essential (primary) hypertension: Secondary | ICD-10-CM | POA: Insufficient documentation

## 2012-09-02 DIAGNOSIS — E119 Type 2 diabetes mellitus without complications: Secondary | ICD-10-CM | POA: Insufficient documentation

## 2012-09-02 DIAGNOSIS — Z79899 Other long term (current) drug therapy: Secondary | ICD-10-CM | POA: Insufficient documentation

## 2012-09-02 DIAGNOSIS — Z7982 Long term (current) use of aspirin: Secondary | ICD-10-CM | POA: Insufficient documentation

## 2012-09-02 DIAGNOSIS — R42 Dizziness and giddiness: Secondary | ICD-10-CM | POA: Insufficient documentation

## 2012-09-02 HISTORY — DX: Dizziness and giddiness: R42

## 2012-09-02 LAB — CBC WITH DIFFERENTIAL/PLATELET
Basophils Absolute: 0 10*3/uL (ref 0.0–0.1)
Basophils Relative: 1 % (ref 0–1)
MCHC: 32 g/dL (ref 30.0–36.0)
Neutro Abs: 4 10*3/uL (ref 1.7–7.7)
Neutrophils Relative %: 54 % (ref 43–77)
Platelets: 367 10*3/uL (ref 150–400)
RDW: 14.2 % (ref 11.5–15.5)

## 2012-09-02 LAB — BASIC METABOLIC PANEL
Chloride: 101 mEq/L (ref 96–112)
GFR calc Af Amer: 61 mL/min — ABNORMAL LOW (ref >90)
Potassium: 3.6 mEq/L (ref 3.5–5.1)

## 2012-09-02 MED ORDER — DIAZEPAM 5 MG PO TABS: 2.5000 mg | ORAL_TABLET | Freq: Two times a day (BID) | ORAL | Status: DC | PRN

## 2012-09-02 NOTE — ED Notes (Addendum)
Pt states that she has been having problems with dizziness's for the past 4 years, worse today. Denies any n/v, headache, any changes in vision, states that she has blurry vision but always has blurry vision. Has been seen by pcp diagnosed with vertigo. At the end of triage, pt also reports that she has been having skipped heart beats over the past 3 days. ekg performed at triage,

## 2012-09-02 NOTE — ED Provider Notes (Addendum)
History    CSN: [id number] Arrival date & time 09/02/12  1702  First MD Initiated Contact with Patient 09/02/12 1818     Chief Complaint  Patient presents with  . Dizziness   (Consider location/radiation/quality/duration/timing/severity/associated sxs/prior Treatment) HPI Comments: Today the pt was getting out of the chair - went to the kitchen to get food that she was cooking - when she got up she felt the feeling of "like I'm going to fall over".  She feels that the room is  Moving - she feels as though she is going to fall to the R.  This lasts a couple of minutes - she went to the bathroom and checked her blood sugar which was 160.  She states that the symptoms gradually eased off and now she has no sx.  She gets this kind of dizziness every 6 months.  She fears standing up b/c she feels like that brings it on.  She has been eating, no diarrhea, no dysuria, cough, sob, cp, back but has had mild headache which is intermittent and not associated with the dizziness.  She does states occasional heart skipping.  She has been dx with possible vertigo by her MD.    She states that the antivert doesn't really help  The history is provided by the patient.   Past Medical History  Diagnosis Date  . Diabetes mellitus   . Hypertension   . Vertigo    History reviewed. No pertinent past surgical history. No family history on file. History  Substance Use Topics  . Smoking status: Never Smoker   . Smokeless tobacco: Not on file  . Alcohol Use: No   OB History   Grav Para Term Preterm Abortions TAB SAB Ect Mult Living                 Review of Systems  All other systems reviewed and are negative.    Allergies  Review of patient's allergies indicates no known allergies.  Home Medications   Current Outpatient Rx  Name  Route  Sig  Dispense  Refill  . aspirin 325 MG tablet   Oral   Take 325 mg by mouth every other day.          Marland Kitchen atenolol (TENORMIN) 100 MG tablet   Oral   Take  100 mg by mouth every morning.          . cholecalciferol (VITAMIN D) 1000 UNITS tablet   Oral   Take 1,000 Units by mouth daily.         . ferrous sulfate 325 (65 FE) MG tablet   Oral   Take 325 mg by mouth daily with breakfast.         . lisinopril-hydrochlorothiazide (PRINZIDE,ZESTORETIC) 20-25 MG per tablet   Oral   Take 1 tablet by mouth daily.         . Naphazoline-Glycerin (REDNESS RELIEF) 0.012-0.25 % SOLN   Ophthalmic   Apply 1 drop to eye daily as needed.         . pioglitazone (ACTOS) 45 MG tablet   Oral   Take 22.5 mg by mouth daily.          . sitaGLIPtin (JANUVIA) 100 MG tablet   Oral   Take 100 mg by mouth daily.           . diazepam (VALIUM) 5 MG tablet   Oral   Take 0.5 tablets (2.5 mg total) by mouth every 12 (twelve) hours  as needed (Dizziness).   5 tablet   0    BP 156/80  Pulse 56  Temp(Src) 98.3 F (36.8 C) (Oral)  Resp 20  Ht 5\' 5"  (1.651 m)  Wt 230 lb (104.327 kg)  BMI 38.27 kg/m2  SpO2 99% Physical Exam  Nursing note and vitals reviewed. Constitutional: She appears well-developed and well-nourished. No distress.  HENT:  Head: Normocephalic and atraumatic.  Mouth/Throat: Oropharynx is clear and moist. No oropharyngeal exudate.  Eyes: Conjunctivae and EOM are normal. Pupils are equal, round, and reactive to light. Right eye exhibits no discharge. Left eye exhibits no discharge. No scleral icterus.  Neck: Normal range of motion. Neck supple. No JVD present. No thyromegaly present.  Cardiovascular: Normal rate, regular rhythm, normal heart sounds and intact distal pulses.  Exam reveals no gallop and no friction rub.   No murmur heard. No JVD  Pulmonary/Chest: Effort normal and breath sounds normal. No respiratory distress. She has no wheezes. She has no rales.  Abdominal: Soft. Bowel sounds are normal. She exhibits no distension and no mass. There is no tenderness.  Musculoskeletal: Normal range of motion. She exhibits no edema  and no tenderness.  Lymphadenopathy:    She has no cervical adenopathy.  Neurological: She is alert. Coordination normal.  Neurologic exam:  Speech clear, pupils equal round reactive to light, extraocular movements intact  Normal peripheral visual fields Cranial nerves III through XII normal including no facial droop Follows commands, moves all extremities x4, normal strength to bilateral upper and lower extremities at all major muscle groups including grip Sensation normal to light touch and pinprick Coordination intact, no limb ataxia, finger-nose-finger normal Rapid alternating movements normal No pronator drift Gait normal No sx at time of exam, no inducible nystagmus  Skin: Skin is warm and dry. No rash noted. No erythema.  Psychiatric: She has a normal mood and affect. Her behavior is normal.    ED Course  Procedures (including critical care time) Labs Reviewed  BASIC METABOLIC PANEL - Abnormal; Notable for the following:    Glucose, Bld 132 (*)    Creatinine, Ser 1.13 (*)    GFR calc non Af Amer 53 (*)    GFR calc Af Amer 61 (*)    All other components within normal limits  CBC WITH DIFFERENTIAL   No results found. 1. Dizziness     MDM  Miss Grobe has a normal neurologic exam, vital signs are unremarkable and her blood test have all been very reassuring. At this time it appears that she is hemodynamically stable, she does not have any limb ataxia, normal gait and will be given a prescription for Valium instead of Antivert as this has not helped her in the past. I have given her instructions to followup with her family doctor, I do not think that she has central vertigo or ischemic source of her symptoms. I have explained these findings to the patient and she has expressed her understanding.  ED ECG REPORT  I personally interpreted this EKG   Date: 09/02/2012   Rate: 59  Rhythm: sinus bradycardia  QRS Axis: normal  Intervals: normal  ST/T Wave abnormalities:  nonspecific T wave changes  Conduction Disutrbances:none  Narrative Interpretation:   Old EKG Reviewed: c/w 01/25/10, no sig changes   Meds given in ED:  Medications - No data to display  New Prescriptions   DIAZEPAM (VALIUM) 5 MG TABLET    Take 0.5 tablets (2.5 mg total) by mouth every 12 (twelve) hours  as needed (Dizziness).      Vida Roller, MD 09/02/12 2017  Vida Roller, MD 09/02/12 2136

## 2012-09-02 NOTE — ED Notes (Signed)
Pt alert & oriented x4, stable gait. Patient given discharge instructions, paperwork & prescription(s). Patient  instructed to stop at the registration desk to finish any additional paperwork. Patient verbalized understanding. Pt left department w/ no further questions. 

## 2013-03-11 ENCOUNTER — Emergency Department (HOSPITAL_COMMUNITY): Payer: Self-pay

## 2013-03-11 ENCOUNTER — Encounter (HOSPITAL_COMMUNITY): Payer: Self-pay | Admitting: Emergency Medicine

## 2013-03-11 ENCOUNTER — Emergency Department (HOSPITAL_COMMUNITY)
Admission: EM | Admit: 2013-03-11 | Discharge: 2013-03-11 | Disposition: A | Discharge status: Home / Self Care | Payer: Self-pay | Attending: Emergency Medicine | Admitting: Emergency Medicine

## 2013-03-11 DIAGNOSIS — I10 Essential (primary) hypertension: Secondary | ICD-10-CM | POA: Insufficient documentation

## 2013-03-11 DIAGNOSIS — R5383 Other fatigue: Secondary | ICD-10-CM

## 2013-03-11 DIAGNOSIS — IMO0001 Reserved for inherently not codable concepts without codable children: Secondary | ICD-10-CM | POA: Insufficient documentation

## 2013-03-11 DIAGNOSIS — E119 Type 2 diabetes mellitus without complications: Secondary | ICD-10-CM | POA: Insufficient documentation

## 2013-03-11 DIAGNOSIS — Z79899 Other long term (current) drug therapy: Secondary | ICD-10-CM | POA: Insufficient documentation

## 2013-03-11 DIAGNOSIS — R5381 Other malaise: Secondary | ICD-10-CM | POA: Insufficient documentation

## 2013-03-11 DIAGNOSIS — R197 Diarrhea, unspecified: Secondary | ICD-10-CM | POA: Insufficient documentation

## 2013-03-11 DIAGNOSIS — R6889 Other general symptoms and signs: Secondary | ICD-10-CM

## 2013-03-11 DIAGNOSIS — J111 Influenza due to unidentified influenza virus with other respiratory manifestations: Secondary | ICD-10-CM | POA: Insufficient documentation

## 2013-03-11 LAB — URINALYSIS, ROUTINE W REFLEX MICROSCOPIC
BILIRUBIN URINE: NEGATIVE
GLUCOSE, UA: NEGATIVE mg/dL
Ketones, ur: NEGATIVE mg/dL
Leukocytes, UA: NEGATIVE
Nitrite: NEGATIVE
PROTEIN: NEGATIVE mg/dL
Specific Gravity, Urine: >1.03 — ABNORMAL HIGH (ref 1.005–1.030)
Urobilinogen, UA: 0.2 mg/dL (ref 0.0–1.0)
pH: 5.5 (ref 5.0–8.0)

## 2013-03-11 LAB — GLUCOSE, CAPILLARY: GLUCOSE-CAPILLARY: 151 mg/dL — AB (ref 70–99)

## 2013-03-11 LAB — URINE MICROSCOPIC-ADD ON

## 2013-03-11 MED ORDER — BENZONATATE 100 MG PO CAPS
200.0000 mg | ORAL_CAPSULE | Freq: Once | ORAL | Status: AC
  Administered 2013-03-11: 200 mg via ORAL
  Filled 2013-03-11: qty 2

## 2013-03-11 MED ORDER — BENZONATATE 100 MG PO CAPS: 200.0000 mg | ORAL_CAPSULE | Freq: Three times a day (TID) | ORAL | Status: DC | PRN

## 2013-03-11 MED ORDER — DIPHENOXYLATE-ATROPINE 2.5-0.025 MG PO TABS
2.0000 | ORAL_TABLET | Freq: Once | ORAL | Status: AC
Start: 2013-03-11 — End: 2013-03-11
  Administered 2013-03-11: 2 via ORAL
  Filled 2013-03-11: qty 2

## 2013-03-11 MED ORDER — DIPHENOXYLATE-ATROPINE 2.5-0.025 MG PO TABS: 1.0000 | ORAL_TABLET | Freq: Four times a day (QID) | ORAL | Status: DC | PRN

## 2013-03-11 NOTE — ED Notes (Signed)
Cough, congestion.  Yellow sputum.  Diarrhea.  Weak ,

## 2013-03-11 NOTE — ED Provider Notes (Signed)
CSN: [id number]     Arrival date & time 03/11/13  1555 History   First MD Initiated Contact with Patient 03/11/13 1720     Chief Complaint  Patient presents with  . Cough   (Consider location/radiation/quality/duration/timing/severity/associated sxs/prior Treatment) HPI Comments: Brooke [name] is a 58 y.o. Female presenting with a 3 day history of cough productive of yellow sputum along with significant chest congestion.  She has had subjective fevers and reports loose, but not completely diarrheal stools,  3-4 episodes per day.  She denies shortness of breath or chest pain, abdominal pain and has no nausea or vomiting.  She has generalized fatigue and is concerned for possible dehydration, albeit has increased her fluid intake, appetite is reduced.  She denies reduced urinary frequency or dysuria.  She is not sleeping well as her cough worsens at night.  She has taken tylenol with no significant improvement in symptoms.  Past history is significant for htn and diabetes.    The history is provided by the patient.    Past Medical History  Diagnosis Date  . Diabetes mellitus   . Hypertension   . Vertigo    Past Surgical History  Procedure Laterality Date  . Breast surgery     History reviewed. No pertinent family history. History  Substance Use Topics  . Smoking status: Never Smoker   . Smokeless tobacco: Not on file  . Alcohol Use: No   OB History   Grav Para Term Preterm Abortions TAB SAB Ect Mult Living                 Review of Systems  Constitutional: Positive for fever and fatigue.  HENT: Negative for congestion, rhinorrhea and sore throat.   Eyes: Negative.   Respiratory: Positive for cough. Negative for chest tightness, shortness of breath and wheezing.   Cardiovascular: Negative for chest pain and leg swelling.  Gastrointestinal: Positive for diarrhea. Negative for nausea, vomiting and abdominal pain.  Genitourinary: Negative.  Negative for dysuria and decreased  urine volume.  Musculoskeletal: Positive for myalgias. Negative for arthralgias, joint swelling and neck pain.  Skin: Negative.  Negative for rash and wound.  Neurological: Negative for dizziness, weakness, light-headedness, numbness and headaches.  Psychiatric/Behavioral: Negative.     Allergies  Review of patient's allergies indicates no known allergies.  Home Medications   Current Outpatient Rx  Name  Route  Sig  Dispense  Refill  . acetaminophen (TYLENOL) 500 MG tablet   Oral   Take 500-1,000 mg by mouth daily as needed for mild pain or moderate pain.         Marland Kitchen albuterol (VENTOLIN HFA) 108 (90 BASE) MCG/ACT inhaler   Inhalation   Inhale 2 puffs into the lungs every 6 (six) hours as needed for wheezing or shortness of breath.         Marland Kitchen aspirin 325 MG tablet   Oral   Take 325 mg by mouth 2 (two) times a week.          Marland Kitchen atenolol (TENORMIN) 100 MG tablet   Oral   Take 100 mg by mouth every morning.          Marland Kitchen lisinopril-hydrochlorothiazide (PRINZIDE,ZESTORETIC) 20-25 MG per tablet   Oral   Take 1 tablet by mouth daily.         Marland Kitchen LOVASTATIN PO   Oral   Take 2 tablets by mouth at bedtime.         . sitaGLIPtin-metformin (JANUMET) 50-1000 MG per  tablet   Oral   Take 1 tablet by mouth 2 (two) times daily.         . benzonatate (TESSALON) 100 MG capsule   Oral   Take 2 capsules (200 mg total) by mouth 3 (three) times daily as needed for cough.   30 capsule   0   . diphenoxylate-atropine (LOMOTIL) 2.5-0.025 MG per tablet   Oral   Take 1 tablet by mouth 4 (four) times daily as needed for diarrhea or loose stools.   15 tablet   0    BP 128/67  Pulse 72  Temp(Src) 98.1 F (36.7 C) (Oral)  Resp 20  Ht 5\' 5"  (1.651 m)  Wt 221 lb (100.245 kg)  BMI 36.78 kg/m2  SpO2 97%  LMP 03/16/2012 Physical Exam  Nursing note and vitals reviewed. Constitutional: She is oriented to person, place, and time. She appears well-developed and well-nourished.  HENT:   Head: Normocephalic and atraumatic.  Mouth/Throat: Oropharynx is clear and moist.  Eyes: Conjunctivae are normal.  Neck: Normal range of motion.  Cardiovascular: Normal rate, regular rhythm, normal heart sounds and intact distal pulses.   Pulmonary/Chest: Effort normal and breath sounds normal. No respiratory distress. She has no decreased breath sounds. She has no wheezes. She has no rhonchi. She has no rales.  Abdominal: Soft. Bowel sounds are normal. There is no tenderness.  Musculoskeletal: Normal range of motion. She exhibits no edema.  Neurological: She is alert and oriented to person, place, and time.  Skin: Skin is warm and dry. No pallor.  No skin tenting  Psychiatric: She has a normal mood and affect.    ED Course  Procedures (including critical care time) Labs Review Labs Reviewed  URINALYSIS, ROUTINE W REFLEX MICROSCOPIC - Abnormal; Notable for the following:    Specific Gravity, Urine >1.030 (*)    Hgb urine dipstick MODERATE (*)    All other components within normal limits  GLUCOSE, CAPILLARY - Abnormal; Notable for the following:    Glucose-Capillary 151 (*)    All other components within normal limits  URINE MICROSCOPIC-ADD ON - Abnormal; Notable for the following:    Squamous Epithelial / LPF FEW (*)    Casts HYALINE CASTS (*)    All other components within normal limits   Imaging Review Dg Chest 2 View  03/11/2013   CLINICAL DATA:  Cough  EXAM: CHEST  2 VIEW  COMPARISON:  None available  FINDINGS: The cardiac and mediastinal silhouettes are within normal limits.  The lungs are normally inflated. No airspace consolidation, pleural effusion, or pulmonary edema is identified. There is no pneumothorax.  No acute osseous abnormality identified.  IMPRESSION: No active cardiopulmonary disease.   Electronically Signed   By: Jeannine Boga M.D.   On: 03/11/2013 18:14    EKG Interpretation   None       MDM   1. Flu-like symptoms   2. Diarrhea    Labs  reviewed, cxr reviewed.  Pt was given tessalon which helped her cough.  Discussed result findings with patient.  No ketonuria, although concentrated - encouraged continued increased fluids, rest, given one dose of lomotil, prescription for same, but cautioned to only take if diarrhea persists.  Tessalon prescribed.  Did not discuss tamiflu as sx have been present 3+ days, would not benefit.    Evalee Jefferson, PA-C 03/12/13 1204

## 2013-03-11 NOTE — Discharge Instructions (Signed)
Diarrhea Diarrhea is frequent loose and watery bowel movements. It can cause you to feel weak and dehydrated. Dehydration can cause you to become tired and thirsty, have a dry mouth, and have decreased urination that often is dark yellow. Diarrhea is a sign of another problem, most often an infection that will not last long. In most cases, diarrhea typically lasts 2 3 days. However, it can last longer if it is a sign of something more serious. It is important to treat your diarrhea as directed by your caregive to lessen or prevent future episodes of diarrhea. CAUSES  Some common causes include:  Gastrointestinal infections caused by viruses, bacteria, or parasites.  Food poisoning or food allergies.  Certain medicines, such as antibiotics, chemotherapy, and laxatives.  Artificial sweeteners and fructose.  Digestive disorders. HOME CARE INSTRUCTIONS  Ensure adequate fluid intake (hydration): have 1 cup (8 oz) of fluid for each diarrhea episode. Avoid fluids that contain simple sugars or sports drinks, fruit juices, whole milk products, and sodas. Your urine should be clear or pale yellow if you are drinking enough fluids. Hydrate with an oral rehydration solution that you can purchase at pharmacies, retail stores, and online. You can prepare an oral rehydration solution at home by mixing the following ingredients together:    tsp table salt.   tsp baking soda.   tsp salt substitute containing potassium chloride.  1  tablespoons sugar.  1 L (34 oz) of water.  Certain foods and beverages may increase the speed at which food moves through the gastrointestinal (GI) tract. These foods and beverages should be avoided and include:  Caffeinated and alcoholic beverages.  High-fiber foods, such as raw fruits and vegetables, nuts, seeds, and whole grain breads and cereals.  Foods and beverages sweetened with sugar alcohols, such as xylitol, sorbitol, and mannitol.  Some foods may be well  tolerated and may help thicken stool including:  Starchy foods, such as rice, toast, pasta, low-sugar cereal, oatmeal, grits, baked potatoes, crackers, and bagels.  Bananas.  Applesauce.  Add probiotic-rich foods to help increase healthy bacteria in the GI tract, such as yogurt and fermented milk products.  Wash your hands well after each diarrhea episode.  Only take over-the-counter or prescription medicines as directed by your caregiver.  Take a warm bath to relieve any burning or pain from frequent diarrhea episodes. SEEK IMMEDIATE MEDICAL CARE IF:   You are unable to keep fluids down.  You have persistent vomiting.  You have blood in your stool, or your stools are black and tarry.  You do not urinate in 6 8 hours, or there is only a small amount of very dark urine.  You have abdominal pain that increases or localizes.  You have weakness, dizziness, confusion, or lightheadedness.  You have a severe headache.  Your diarrhea gets worse or does not get better.  You have a fever or persistent symptoms for more than 2 3 days.  You have a fever and your symptoms suddenly get worse. MAKE SURE YOU:   Understand these instructions.  Will watch your condition.  Will get help right away if you are not doing well or get worse. Document Released: 02/11/2002 Document Revised: 02/08/2012 Document Reviewed: 10/30/2011 [facility] Patient Information 2014 [place], [place].  Cough, Adult  A cough is a reflex. It helps you clear your throat and airways. A cough can help heal your body. A cough can last 2 or 3 weeks (acute) or may last more than 8 weeks (chronic). Some common  causes of a cough can include an infection, allergy, or a cold. HOME CARE  Only take medicine as told by your doctor.  If given, take your medicines (antibiotics) as told. Finish them even if you start to feel better.  Use a cold steam vaporizer or humidier in your home. This can help loosen thick spit  (secretions).  Sleep so you are almost sitting up (semi-upright). Use pillows to do this. This helps reduce coughing.  Rest as needed.  Stop smoking if you smoke. GET HELP RIGHT AWAY IF:  You have yellowish-white fluid (pus) in your thick spit.  Your cough gets worse.  Your medicine does not reduce coughing, and you are losing sleep.  You cough up blood.  You have trouble breathing.  Your pain gets worse and medicine does not help.  You have a fever. MAKE SURE YOU:   Understand these instructions.  Will watch your condition.  Will get help right away if you are not doing well or get worse. Document Released: 11/04/2010 Document Revised: 05/16/2011 Document Reviewed: 11/04/2010 [facility] Patient Information 2014 [place].    Emergency Department Resource Guide 1) Find a Doctor and Pay Out of Pocket Although you won't have to find out who is covered by your insurance plan, it is a good idea to ask around and get recommendations. You will then need to call the office and see if the doctor you have chosen will accept you as a new patient and what types of options they offer for patients who are self-pay. Some doctors offer discounts or will set up payment plans for their patients who do not have insurance, but you will need to ask so you aren't surprised when you get to your appointment.  2) Contact Your Local Health Department Not all health departments have doctors that can see patients for sick visits, but many do, so it is worth a call to see if yours does. If you don't know where your local health department is, you can check in your phone book. The CDC also has a tool to help you locate your state's health department, and many state websites also have listings of all of their local health departments.  3) Find a [facility] If your illness is not likely to be very severe or complicated, you may want to try a walk in clinic. These are popping up all over the  country in pharmacies, drugstores, and shopping centers. They're usually staffed by nurse practitioners or physician assistants that have been trained to treat common illnesses and complaints. They're usually fairly quick and inexpensive. However, if you have serious medical issues or chronic medical problems, these are probably not your best option.  No Primary Care Doctor: - Call Health Connect at  [phone] - they can help you locate a primary care doctor that  accepts your insurance, provides certain services, etc. - Physician Referral Service- [phone]  Chronic Pain Problems: Organization         Address  Phone   Notes  [facility]  [phone] Patients need to be referred by their primary care doctor.   Medication Assistance: Organization         Address  Phone   Notes  [facility] [address] [place], [place] [address] [phone] --Must be a resident of [facility] -- Must have NO insurance coverage whatsoever (no Medicaid/ Medicare, etc.) -- The pt. MUST have a primary care doctor that  directs their care regularly and follows them in the community   MedAssist  [phone]   [employer]  [phone]    Agencies that provide inexpensive medical care: Organization         Address  Phone   Notes  [place]  [phone]   [facility]    [phone]   [facility] [place], [place] [address] [phone]   Liborio Negron Torres [address], [place] [phone]   Planned Parenthood    [phone]   [facility]    [phone]   [place] and [place] [address], [place] Phone:  [phone], Fax:  [phone] Hours of Operation:  9 am - 6 pm, M-F.  Also accepts Medicaid/Medicare and self-pay.  [facility] for [place], [address], [place] Phone: [phone], Fax: [phone]. Hours of Operation:  8:30 am - 5:30 pm, M-F.  Also accepts Medicaid and self-pay.  [facility] [place] [address] [place] Phone: [phone]   [place], [place], [place] [phone], Ext. [phone] Mondays & Thursdays: 7-9 AM.  First 15 patients are seen on a first come, first serve basis.    [place] Providers:  Organization         Address  Phone   Notes  [facility] [address], [place] [phone] Also accepts self-pay patients.  [facility] [address], [place]  [phone]   [place], [address], [place] [phone]   [facility] [address], [place] [phone]   Lucianne Lei [address], [place]   [phone] Only accepts [place] Access [place] patients after they have their name applied to their card.   Self-Pay (no insurance) in [facility]:  Organization         Address  Phone   Notes  Sickle Cell Patients, [facility] [place] [phone]   [facility] [place] [phone]   [facility] [place]  [place], [place], [employer] [phone]   [facility]/Dr. Osei-Bonsu  [address], [place] or [address], [place] [phone] Phone number for both [place] and [place] locations is the same.  [facility] [address] [place] [phone]   [facility] [address], [place] or [address] [phone]   [facility] [address], [place] [phone], phone; [phone], fax Sees patients 1st and 3rd Saturday of every month.  Must  not qualify for public or private insurance (i.e. Medicaid, Medicare, [place] Health Choice, Veterans' Benefits)  Household income should be no more than 200% of the poverty level The clinic cannot treat you if you are pregnant or think you are pregnant  Sexually transmitted diseases are not treated at the clinic.    Dental Care: Organization         Address  Phone  Notes  [facility] [address] [place], [place] [phone] Accepts children up  to age 49 who are enrolled in Medicaid or [place] Health Choice; pregnant women with a Medicaid card; and children who have applied for Medicaid or [place] Health Choice, but were declined, whose parents can pay a reduced fee at time of service.  [facility]  [address], [place] [phone] Accepts children up to age 25 who are enrolled in [place] or [place]; pregnant women with a Medicaid card; and children who have applied for Medicaid or [place] Health Choice, but were declined, whose parents can pay a reduced fee at time of service.  [place] Adult [facility] Access PRO[facility]  [place] [phone] Patients are seen by appointment only. Walk-ins are not accepted. [place] will see patients 64 years of age and older. Monday - Tuesday (8am-5pm) Most Wednesdays (8:30-5pm) $30 per visit, cash only  [facility] Adult Dental Access PROGRAM  [address], [facility] [phone] Patients are seen by appointment only. Walk-ins are not accepted. [place] will see patients 57 years of age and older. One Wednesday Evening (Monthly: Volunteer Based).  $30 per visit, cash only  [place]  [phone] for adults; Children under age 20, call Graduate Pediatric Dentistry at [phone]. Children aged 105-14, please call [phone] to request a pediatric application.  Dental services are  provided in all areas of dental care including fillings, crowns and bridges, complete and partial dentures, implants, gum treatment, root canals, and extractions. Preventive care is also provided. Treatment is provided to both adults and children. Patients are selected via a lottery and there is often a waiting list.   [facility] [address], [place]  [phone] [email or web address]   [facility] [address] [place], [place] [phone], Ext. [phone] Second and Fourth Thursday of each month, opens at 6:30 AM; Clinic ends at 9 AM.  Patients are seen on a first-come first-served basis, and a limited number are seen during each clinic.   [facility]  [address] Hillard Danker [place], [place] [phone]   Eligibility Requirements You must have lived in [place], [place], or [place] counties for at least the last three months.   You cannot be eligible for state or federal sponsored [employer], including [employer], [place], or [employer].   You generally cannot be eligible for healthcare insurance through your employer.    How to apply: Eligibility screenings are held every Tuesday and Wednesday afternoon from 1:00 pm until 4:00 pm. You do not need an appointment for the interview!  [facility] [address], [place], [place]   [place]  [place] Department  [place]  [phone]    Behavioral Health Resources in the Community: Intensive Outpatient Programs Organization         Address  Phone  Notes  Arnold Greenwood. [address], [place], [place] [phone]   [facility] Outpatient [address], [place], [place]   ADS: Alcohol & Drug Svcs [address], [place], [place]   [place] [address]  [place], [employer] or [phone]   Substance Abuse Resources Organization         Address  Phone  Notes  Alcohol and Drug Services  [phone]   [facility]  [phone]   [facility] [place]   Chinita Pester  [phone]   Residential & Outpatient Substance Abuse Program  4027951571   Psychological Services Organization         Address  Phone  Notes  [facility] [place]  [place]  [phone]   [place] [address] [place] or [phone]    Mobile Crisis Teams Organization         Address  Phone  Notes  [facility], Mobile Crisis Care Unit  [phone]   [facility]  [address] [place], [place]   [place] Levels [address], [place] [phone]    Self-Help/Support Groups Organization         Address  Phone             Notes  [place]. of [place] - variety of support groups  [place] Call for more information  Narcotics Anonymous (NA), [facility]aring [facility] [address], [employer] [place]  2 meetings at this location   Special educational needs teacher         Address  Phone  Notes  [facility]idential Treatment [place],    [place]  [phone]   [facility]  [address], [place] [phone], [place], [place]   [place], [place] [phone] Admissions: 8am-3pm M-F  Incentives Substance [place] [address],    [place], [place] [phone]   [facility] [address] [place], [place], [place]   [facility] [address]  [place], [place]   [facility]ight Programs - Intensive Outpatient [address] Kristeen [address], [place], [place]   [facility] ([place] [address]  [place], [place] [phone] or  [phone]   [facility]idential Treatment Services (RTS) [address], [place], [place] Accepts Medicaid  [facility] [place] [address],  [place] 1-5858454224 Substance Abuse/Addiction Treatment   [facility] Organization         Address  Phone  Notes  [facility]  [phone]   Domenic Schwab, PhD [address] Arlis [place], [place]   [phone] or [phone]   [place], [place] [phone]   [facility] [address] [place], [place] [phone] Insurance/Medicaid/sponsorship through [facility] and Families [address], [address] [place]                                    [place], [place] [phone] [place] [address], [place] [phone]    Dr. Adele Schilder  [phone]   [facility] Dept. 1) [address] [place] 2) [place] 3)  [place] [address], [place] [phone]  [phone]   [place] [phone] or [phone] (After Hours)

## 2013-03-12 NOTE — ED Provider Notes (Signed)
Medical screening examination/treatment/procedure(s) were performed by non-physician practitioner and as supervising physician I was immediately available for consultation/collaboration.  EKG Interpretation   None         Mervin Kung, MD 03/12/13 [phone]

## 2014-01-15 ENCOUNTER — Encounter: Payer: Self-pay | Admitting: Nutrition

## 2014-01-15 ENCOUNTER — Encounter: Payer: Self-pay | Attending: Nurse Practitioner | Admitting: Nutrition

## 2014-01-15 VITALS — Ht 65.0 in | Wt 214.8 lb

## 2014-01-15 DIAGNOSIS — E1165 Type 2 diabetes mellitus with hyperglycemia: Secondary | ICD-10-CM

## 2014-01-15 DIAGNOSIS — IMO0002 Reserved for concepts with insufficient information to code with codable children: Secondary | ICD-10-CM

## 2014-01-15 DIAGNOSIS — E118 Type 2 diabetes mellitus with unspecified complications: Principal | ICD-10-CM

## 2014-01-15 NOTE — Progress Notes (Signed)
  Medical Nutrition Therapy:  Appt start time: 1130 end time:  1230.  Assessment:  Primary concerns today: Diabetes. Tests a few times per week after she eats. 180-200's. FBS 170-200 mg/dl. Lives with her friend. She does the cooking and shopping.  Not exercising much right now. Not working right now.  Use to weigh 232 lbs.  Most recent A1C is thought to be around 7% per pt. Didn't bring and blood sugar log. Doesn't have any insurance. Has a Relion meter from [place] and uses those to test her blood sugars. Sees providers at the [facility].  MEDICATIONS: see list  DIETARY INTAKE:  24-hr recall:  B ( AM): 1 egg and 1 slice of toast OR 1 banana smoothie-(banana, 1/4 cup milk and a smoothie powder mix) OR cinnamon applesauce  Snk L Spaghetti 1 cup with toss salad, 1 slice of toast, Coke 6 oz Snack: potatoes and spinach  1 cup, cola D Bologna sandwich OR Cube steak/gravy, creamed potatoes, broccoli or greens. Snk chips, Beverages: cola, water, lemonade  Recent physical activity: ADL  Estimated energy needs: 1500 calories 170 g carbohydrates 112 g protein 42 g fat  Progress Towards Goal(s):  In progress.   Nutritional Diagnosis:  NB-1.1 Food and nutrition-related knowledge deficit As related to Diabetes.  As evidenced by A1C >7%..    Intervention:  Nutrition counseling.. Plan:  Aim for 2-3 Carb Choices per meal (30-45 grams) +/- 1 either way  Avoid snacks between meals Drink only water or crystal light and  Cut out sodas, and lemonade and smoothies. Include protein in moderation with your meals a Consider  increasing your activity level by 30 for 60 minutes daily as tolerated Consider checking BG at alternate times per day as directed by MD  Consider taking medication  as directed by MD Goal: Lose 1 lb per week. 2. Get A1C below 6.5% 3. Eating three balanced meals. 4. Measure foods out.  Handouts given during visit include:  Monitoring/Evaluation:   Dietary intake, exercise, meal planning, SBG and body weight  In 1 month.

## 2014-01-15 NOTE — Patient Instructions (Signed)
Plan:  Aim for 2-3 Carb Choices per meal (30-45 grams) +/- 1 either way  Avoid snacks between meals Drink only water or crystal light and  Cut out sodas, and lemonade and smoothies. Include protein in moderation with your meals a Consider  increasing your activity level by 30 for 60 minutes daily as tolerated Consider checking BG at alternate times per day as directed by MD  Consider taking medication  as directed by MD Goal: Lose 1 lb per week. 2. Get A1C below 6.5% 3. Eating three balanced meals. 4. Measure foods out.

## 2014-04-16 ENCOUNTER — Encounter: Payer: Self-pay | Admitting: Nutrition

## 2014-05-29 ENCOUNTER — Ambulatory Visit: Payer: Self-pay | Admitting: Nutrition

## 2014-05-29 ENCOUNTER — Telehealth: Payer: Self-pay | Admitting: Nutrition

## 2014-05-29 NOTE — Telephone Encounter (Signed)
Called and left message on cell  VM to call to reschedule. Jearld Fenton, RDN CDE

## 2014-12-02 ENCOUNTER — Emergency Department (HOSPITAL_COMMUNITY)
Admission: EM | Admit: 2014-12-02 | Discharge: 2014-12-03 | Disposition: A | Discharge status: Home / Self Care | Payer: Self-pay | Attending: Emergency Medicine | Admitting: Emergency Medicine

## 2014-12-02 ENCOUNTER — Encounter (HOSPITAL_COMMUNITY): Payer: Self-pay | Admitting: *Deleted

## 2014-12-02 DIAGNOSIS — N289 Disorder of kidney and ureter, unspecified: Secondary | ICD-10-CM | POA: Insufficient documentation

## 2014-12-02 DIAGNOSIS — Z7982 Long term (current) use of aspirin: Secondary | ICD-10-CM | POA: Insufficient documentation

## 2014-12-02 DIAGNOSIS — H81399 Other peripheral vertigo, unspecified ear: Secondary | ICD-10-CM | POA: Insufficient documentation

## 2014-12-02 DIAGNOSIS — E669 Obesity, unspecified: Secondary | ICD-10-CM | POA: Insufficient documentation

## 2014-12-02 DIAGNOSIS — Z79899 Other long term (current) drug therapy: Secondary | ICD-10-CM | POA: Insufficient documentation

## 2014-12-02 DIAGNOSIS — E119 Type 2 diabetes mellitus without complications: Secondary | ICD-10-CM | POA: Insufficient documentation

## 2014-12-02 DIAGNOSIS — I10 Essential (primary) hypertension: Secondary | ICD-10-CM | POA: Insufficient documentation

## 2014-12-02 NOTE — ED Provider Notes (Signed)
CSN: [id number]     Arrival date & time 12/02/14  2341 History  By signing my name below, I, Helane Gunther, attest that this documentation has been prepared under the direction and in the presence of Delora Fuel, MD. Electronically Signed: Helane Gunther, ED Scribe. 12/03/2014. 12:07 AM.    Chief Complaint  Patient presents with  . Near Syncope    dizziness for years but this episode is worse. the spinning is worse today per patient   The history is provided by the patient. No language interpreter was used.   HPI Comments: Brooke [name] is a 59 y.o. female who presents to the Emergency Department complaining of near syncope that occurred 1 hour ago. Pt states she was lying down when she started feeling extremely dizzy (as though the room was about to start spinning). She notes exacerbation of the dizziness when trying to stand, and alleviation when putting her head in her hand. She reports a PMHx of vertigo but states that tonight's episode of dizziness is much worse than usual. Pt states that a few hours ago her blood sugar was elevated (3). She notes she was just seen at the health department 6 days ago. Pt denies nausea.  Past Medical History  Diagnosis Date  . Diabetes mellitus   . Hypertension   . Vertigo   . Obesity (BMI 35.0-39.9 without comorbidity)    Past Surgical History  Procedure Laterality Date  . Breast surgery     History reviewed. No pertinent family history. Social History  Substance Use Topics  . Smoking status: Never Smoker   . Smokeless tobacco: None  . Alcohol Use: No   OB History    No data available     Review of Systems  Gastrointestinal: Negative for nausea.  Neurological: Positive for dizziness.  All other systems reviewed and are negative.   Allergies  Sulfa antibiotics  Home Medications   Prior to Admission medications   Medication Sig Start Date End Date Taking? Authorizing Lanecia Sliva  albuterol (VENTOLIN HFA) 108 (90 BASE) MCG/ACT  inhaler Inhale 2 puffs into the lungs every 6 (six) hours as needed for wheezing or shortness of breath.   Yes Historical Melayah Skorupski, MD  aspirin 81 MG tablet Take 81 mg by mouth daily.   Yes Historical Jenalyn Girdner, MD  atenolol (TENORMIN) 100 MG tablet Take 100 mg by mouth every morning.    Yes Historical Allyn Bertoni, MD  lisinopril-hydrochlorothiazide (PRINZIDE,ZESTORETIC) 20-25 MG per tablet Take 1 tablet by mouth daily. 11/06/10  Yes Charlena Cross, MD  sitaGLIPtin-metformin (JANUMET) 50-1000 MG per tablet Take 1 tablet by mouth 2 (two) times daily.   Yes Historical Kijana Estock, MD  acetaminophen (TYLENOL) 500 MG tablet Take 500-1,000 mg by mouth daily as needed for mild pain or moderate pain.    Historical Aurore Redinger, MD  aspirin 325 MG tablet Take 325 mg by mouth 2 (two) times a week.     Historical Kiela Shisler, MD  benzonatate (TESSALON) 100 MG capsule Take 2 capsules (200 mg total) by mouth 3 (three) times daily as needed for cough. 03/11/13   Evalee Jefferson, PA-C  diphenoxylate-atropine (LOMOTIL) 2.5-0.025 MG per tablet Take 1 tablet by mouth 4 (four) times daily as needed for diarrhea or loose stools. 03/11/13   Evalee Jefferson, PA-C  ferrous sulfate 325 (65 FE) MG tablet Take 325 mg by mouth daily with breakfast.    Historical Parke Jandreau, MD  glipiZIDE (GLUCOTROL) 5 MG tablet Take by mouth daily before breakfast.    Historical Eriq Hufford,  MD  LOVASTATIN PO Take 2 tablets by mouth at bedtime.    Historical Mahoganie Basher, MD  Omega-3 Fatty Acids (FISH OIL) 1000 MG CAPS Take by mouth.    Historical Allsion Nogales, MD   BP 155/71 mmHg  Pulse 77  Temp(Src) 98 F (36.7 C) (Oral)  Resp 14  Ht 5\' 6"  (1.676 m)  Wt 213 lb (96.616 kg)  BMI 34.40 kg/m2  SpO2 100%  LMP 03/16/2012 Physical Exam  Constitutional: She is oriented to person, place, and time. She appears well-developed and well-nourished.  HENT:  Head: Normocephalic.  Eyes: EOM are normal. Pupils are equal, round, and reactive to light.  Neck: Normal range of motion. Neck  supple. No JVD present.  No carotid bruit   Cardiovascular: Normal rate, regular rhythm and normal heart sounds.   No murmur heard. Pulmonary/Chest: Effort normal and breath sounds normal. She has no wheezes. She has no rales. She exhibits no tenderness.  Abdominal: Soft. Bowel sounds are normal. She exhibits no mass. There is no tenderness.  Musculoskeletal: Normal range of motion. She exhibits no edema.  Lymphadenopathy:    She has no cervical adenopathy.  Neurological: She is alert and oriented to person, place, and time. No cranial nerve deficit. She exhibits normal muscle tone. Coordination normal.  Dizziness reproduced by passive head movement  Skin: Skin is warm and dry. No rash noted.  Psychiatric: She has a normal mood and affect. Her behavior is normal. Judgment and thought content normal.  Nursing note and vitals reviewed.   ED Course  Procedures  DIAGNOSTIC STUDIES: Oxygen Saturation is 100% on RA, normal by my interpretation.    COORDINATION OF CARE: 12:05 AM - Discussed normal EKG. Discussed plans to order diagnostic studies and meclizine. Pt advised of plan for treatment and pt agrees.  Labs Review Results for orders placed or performed during the hospital encounter of 12/02/14  Comprehensive metabolic panel  Result Value Ref Range   Sodium 136 135 - 145 mmol/L   Potassium 3.9 3.5 - 5.1 mmol/L   Chloride 104 101 - 111 mmol/L   CO2 25 22 - 32 mmol/L   Glucose, Bld 151 (H) 65 - 99 mg/dL   BUN 26 (H) 6 - 20 mg/dL   Creatinine, Ser 1.45 (H) 0.44 - 1.00 mg/dL   Calcium 8.6 (L) 8.9 - 10.3 mg/dL   Total Protein 7.1 6.5 - 8.1 g/dL   Albumin 3.5 3.5 - 5.0 g/dL   AST 23 15 - 41 U/L   ALT 37 14 - 54 U/L   Alkaline Phosphatase 63 38 - 126 U/L   Total Bilirubin 0.4 0.3 - 1.2 mg/dL   GFR calc non Af Amer 38 (L) >60 mL/min   GFR calc Af Amer 44 (L) >60 mL/min   Anion gap 7 5 - 15  Troponin I  Result Value Ref Range   Troponin I <0.03 <0.031 ng/mL  CBC with Differential   Result Value Ref Range   WBC 7.9 4.0 - 10.5 K/uL   RBC 4.39 3.87 - 5.11 MIL/uL   Hemoglobin 11.9 (L) 12.0 - 15.0 g/dL   HCT 36.5 36.0 - 46.0 %   MCV 83.1 78.0 - 100.0 fL   MCH 27.1 26.0 - 34.0 pg   MCHC 32.6 30.0 - 36.0 g/dL   RDW 13.6 11.5 - 15.5 %   Platelets 352 150 - 400 K/uL   Neutrophils Relative % 44 %   Neutro Abs 3.4 1.7 - 7.7 K/uL   Lymphocytes Relative  41 %   Lymphs Abs 3.3 0.7 - 4.0 K/uL   Monocytes Relative 11 %   Monocytes Absolute 0.8 0.1 - 1.0 K/uL   Eosinophils Relative 4 %   Eosinophils Absolute 0.3 0.0 - 0.7 K/uL   Basophils Relative 0 %   Basophils Absolute 0.0 0.0 - 0.1 K/uL   I have personally reviewed and evaluated these lab results as part of my medical decision-making.   EKG Interpretation   Date/Time:  Tuesday December 02 2014 23:57:04 EDT Ventricular Rate:  75 PR Interval:  203 QRS Duration: 92 QT Interval:  359 QTC Calculation: 401 R Axis:   60 Text Interpretation:  Sinus rhythm Borderline prolonged PR interval  Borderline T abnormalities, anterior leads When compared with ECG of  09/02/2012, No significant change was found Confirmed by [facility]  MD, DAVID  ([id number]) on 12/03/2014 12:04:26 AM      MDM   Final diagnoses:  Peripheral vertigo, unspecified laterality  Renal insufficiency    Dizziness in pattern that seems most consistent with peripheral vertigo. Atypical feature is absence of nausea. There is reproduction of symptoms with passive head movement which is consistent with peripheral vertigo. Old records are reviewed and she does have a prior ED visit for vertigo. Of note, at that time, there is a statement that she did not respond well to meclizine. She is given a trial of meclizine in the ED.  She had excellent relief of symptoms with meclizine, in relation it had not worked for her in the past. Laboratory workup shows renal insufficiency which is unchanged from baseline. She is discharged with prescription for meclizine.  I  personally performed the services described in this documentation, which was scribed in my presence. The recorded information has been reviewed and is accurate.     Delora Fuel, MD 74/12/87 [id number]

## 2014-12-03 LAB — CBC WITH DIFFERENTIAL/PLATELET
Basophils Absolute: 0 10*3/uL (ref 0.0–0.1)
Basophils Relative: 0 %
Eosinophils Absolute: 0.3 10*3/uL (ref 0.0–0.7)
Eosinophils Relative: 4 %
HEMATOCRIT: 36.5 % (ref 36.0–46.0)
Hemoglobin: 11.9 g/dL — ABNORMAL LOW (ref 12.0–15.0)
LYMPHS ABS: 3.3 10*3/uL (ref 0.7–4.0)
Lymphocytes Relative: 41 %
MCH: 27.1 pg (ref 26.0–34.0)
MCHC: 32.6 g/dL (ref 30.0–36.0)
MCV: 83.1 fL (ref 78.0–100.0)
MONO ABS: 0.8 10*3/uL (ref 0.1–1.0)
MONOS PCT: 11 %
NEUTROS ABS: 3.4 10*3/uL (ref 1.7–7.7)
Neutrophils Relative %: 44 %
Platelets: 352 10*3/uL (ref 150–400)
RBC: 4.39 MIL/uL (ref 3.87–5.11)
RDW: 13.6 % (ref 11.5–15.5)
WBC: 7.9 10*3/uL (ref 4.0–10.5)

## 2014-12-03 LAB — TROPONIN I: Troponin I: <0.03 ng/mL (ref <0.031)

## 2014-12-03 LAB — COMPREHENSIVE METABOLIC PANEL
ALBUMIN: 3.5 g/dL (ref 3.5–5.0)
ALT: 37 U/L (ref 14–54)
ANION GAP: 7 (ref 5–15)
AST: 23 U/L (ref 15–41)
Alkaline Phosphatase: 63 U/L (ref 38–126)
BUN: 26 mg/dL — AB (ref 6–20)
CO2: 25 mmol/L (ref 22–32)
Calcium: 8.6 mg/dL — ABNORMAL LOW (ref 8.9–10.3)
Chloride: 104 mmol/L (ref 101–111)
Creatinine, Ser: 1.45 mg/dL — ABNORMAL HIGH (ref 0.44–1.00)
GFR calc Af Amer: 44 mL/min — ABNORMAL LOW (ref >60)
GFR calc non Af Amer: 38 mL/min — ABNORMAL LOW (ref >60)
GLUCOSE: 151 mg/dL — AB (ref 65–99)
Potassium: 3.9 mmol/L (ref 3.5–5.1)
SODIUM: 136 mmol/L (ref 135–145)
Total Bilirubin: 0.4 mg/dL (ref 0.3–1.2)
Total Protein: 7.1 g/dL (ref 6.5–8.1)

## 2014-12-03 MED ORDER — MECLIZINE HCL 12.5 MG PO TABS
25.0000 mg | ORAL_TABLET | Freq: Once | ORAL | Status: AC
  Administered 2014-12-03: 25 mg via ORAL
  Filled 2014-12-03: qty 2

## 2014-12-03 MED ORDER — MECLIZINE HCL 25 MG PO TABS: 25.0000 mg | ORAL_TABLET | Freq: Three times a day (TID) | ORAL | Status: DC | PRN

## 2014-12-03 NOTE — Discharge Instructions (Signed)
Vertigo °Vertigo means you feel like you or your surroundings are moving when they are not. Vertigo can be dangerous if it occurs when you are at work, driving, or performing difficult activities.  °CAUSES  °Vertigo occurs when there is a conflict of signals sent to your brain from the visual and sensory systems in your body. There are many different causes of vertigo, including: °· Infections, especially in the inner ear. °· A bad reaction to a drug or misuse of alcohol and medicines. °· Withdrawal from drugs or alcohol. °· Rapidly changing positions, such as lying down or rolling over in bed. °· A migraine headache. °· Decreased blood flow to the brain. °· Increased pressure in the brain from a head injury, infection, tumor, or bleeding. °SYMPTOMS  °You may feel as though the world is spinning around or you are falling to the ground. Because your balance is upset, vertigo can cause nausea and vomiting. You may have involuntary eye movements (nystagmus). °DIAGNOSIS  °Vertigo is usually diagnosed by physical exam. If the cause of your vertigo is unknown, your caregiver may perform imaging tests, such as an MRI scan (magnetic resonance imaging). °TREATMENT  °Most cases of vertigo resolve on their own, without treatment. Depending on the cause, your caregiver may prescribe certain medicines. If your vertigo is related to body position issues, your caregiver may recommend movements or procedures to correct the problem. In rare cases, if your vertigo is caused by certain inner ear problems, you may need surgery. °HOME CARE INSTRUCTIONS  °· Follow your caregiver's instructions. °· Avoid driving. °· Avoid operating heavy machinery. °· Avoid performing any tasks that would be dangerous to you or others during a vertigo episode. °· Tell your caregiver if you notice that certain medicines seem to be causing your vertigo. Some of the medicines used to treat vertigo episodes can actually make them worse in some people. °SEEK  IMMEDIATE MEDICAL CARE IF:  °· Your medicines do not relieve your vertigo or are making it worse. °· You develop problems with talking, walking, weakness, or using your arms, hands, or legs. °· You develop severe headaches. °· Your nausea or vomiting continues or gets worse. °· You develop visual changes. °· A family member notices behavioral changes. °· Your condition gets worse. °MAKE SURE YOU: °· Understand these instructions. °· Will watch your condition. °· Will get help right away if you are not doing well or get worse. °Document Released: 12/01/2004 Document Revised: 05/16/2011 Document Reviewed: 09/09/2010 °ExitCare® Patient Information ©2015 [employer]. This information is not intended to replace advice given to you by your health care provider. Make sure you discuss any questions you have with your health care provider. ° °Meclizine tablets or capsules °What is this medicine? °MECLIZINE (MEK li zeen) is an antihistamine. It is used to prevent nausea, vomiting, or dizziness caused by motion sickness. It is also used to prevent and treat vertigo (extreme dizziness or a feeling that you or your surroundings are tilting or spinning around). °This medicine may be used for other purposes; ask your health care provider or pharmacist if you have questions. °COMMON BRAND NAME(S): Antivert, Dramamine Less Drowsy, Medivert, Meni-D °What should I tell my health care provider before I take this medicine? °They need to know if you have any of these conditions: °-asthma °-glaucoma °-prostate trouble °-stomach problems °-urinary problems °-an unusual or allergic reaction to meclizine, other medicines, foods, dyes, or preservatives °-pregnant or trying to get pregnant °-breast-feeding °How should I use this medicine? °  Take this medicine by mouth with a glass of water. Follow the directions on the prescription label. If you are using this medicine to prevent motion sickness, take the dose at least 1 hour before travel.  If it upsets your stomach, take it with food or milk. Take your doses at regular intervals. Do not take your medicine more often than directed. °Talk to your pediatrician regarding the use of this medicine in children. Special care may be needed. °Overdosage: If you think you have taken too much of this medicine contact a poison control center or emergency room at once. °NOTE: This medicine is only for you. Do not share this medicine with others. °What if I miss a dose? °If you miss a dose, take it as soon as you can. If it is almost time for your next dose, take only that dose. Do not take double or extra doses. °What may interact with this medicine? °-barbiturate medicines for inducing sleep or treating seizures °-digoxin °-medicines for anxiety or sleeping problems, like alprazolam, diazepam or temazepam °-medicines for hay fever and other allergies °-medicines for mental depression °-medicines for movement abnormalities as in Parkinson's disease, or for stomach problems °-medicines for pain °-medicines that relax muscles °This list may not describe all possible interactions. Give your health care provider a list of all the medicines, herbs, non-prescription drugs, or dietary supplements you use. Also tell them if you smoke, drink alcohol, or use illegal drugs. Some items may interact with your medicine. °What should I watch for while using this medicine? °If you are taking this medicine on a regular schedule, visit your doctor or health care professional for regular checks on your progress. °You may get dizzy, drowsy or have blurred vision. Do not drive, use machinery, or do anything that needs mental alertness until you know how this medicine affects you. Do not stand or sit up quickly, especially if you are an older patient. This reduces the risk of dizzy or fainting spells. Alcohol can increase possible dizziness. Avoid alcoholic drinks. °Your mouth may get dry. Chewing sugarless gum or sucking hard candy,  and drinking plenty of water may help. Contact your doctor if the problem does not go away or is severe. °This medicine may cause dry eyes and blurred vision. If you wear contact lenses you may feel some discomfort. Lubricating drops may help. See your eye doctor if the problem does not go away or is severe. °What side effects may I notice from receiving this medicine? °Side effects that you should report to your doctor or health care professional as soon as possible: °-fainting spells °-fast or irregular heartbeat °Side effects that usually do not require medical attention (report to your doctor or health care professional if they continue or are bothersome): °-constipation °-difficulty passing urine °-difficulty sleeping °-headache °-stomach upset °This list may not describe all possible side effects. Call your doctor for medical advice about side effects. You may report side effects to FDA at 1-800-FDA-1088. °Where should I keep my medicine? °Keep out of the reach of children. °Store at room temperature between 15 and 30 degrees C (59 and 86 degrees F). Keep container tightly closed. Throw away any unused medicine after the expiration date. °NOTE: This sheet is a summary. It may not cover all possible information. If you have questions about this medicine, talk to your doctor, pharmacist, or health care provider. °© 2015, Elsevier/[employer] Standard. (2007-08-30 10:35:36) ° °

## 2015-04-12 ENCOUNTER — Emergency Department (HOSPITAL_COMMUNITY)
Admission: EM | Admit: 2015-04-12 | Discharge: 2015-04-12 | Disposition: A | Discharge status: Home / Self Care | Payer: Self-pay | Attending: Emergency Medicine | Admitting: Emergency Medicine

## 2015-04-12 ENCOUNTER — Encounter (HOSPITAL_COMMUNITY): Payer: Self-pay | Admitting: *Deleted

## 2015-04-12 DIAGNOSIS — I10 Essential (primary) hypertension: Secondary | ICD-10-CM | POA: Insufficient documentation

## 2015-04-12 DIAGNOSIS — Z79899 Other long term (current) drug therapy: Secondary | ICD-10-CM | POA: Insufficient documentation

## 2015-04-12 DIAGNOSIS — Z7984 Long term (current) use of oral hypoglycemic drugs: Secondary | ICD-10-CM | POA: Insufficient documentation

## 2015-04-12 DIAGNOSIS — J209 Acute bronchitis, unspecified: Secondary | ICD-10-CM | POA: Insufficient documentation

## 2015-04-12 DIAGNOSIS — J40 Bronchitis, not specified as acute or chronic: Secondary | ICD-10-CM

## 2015-04-12 DIAGNOSIS — E119 Type 2 diabetes mellitus without complications: Secondary | ICD-10-CM | POA: Insufficient documentation

## 2015-04-12 DIAGNOSIS — Z7982 Long term (current) use of aspirin: Secondary | ICD-10-CM | POA: Insufficient documentation

## 2015-04-12 DIAGNOSIS — E669 Obesity, unspecified: Secondary | ICD-10-CM | POA: Insufficient documentation

## 2015-04-12 MED ORDER — GUAIFENESIN-CODEINE 100-10 MG/5ML PO SYRP: 10.0000 mL | ORAL_SOLUTION | Freq: Every evening | ORAL | Status: DC | PRN

## 2015-04-12 MED ORDER — AZITHROMYCIN 250 MG PO TABS: ORAL_TABLET | ORAL | Status: DC

## 2015-04-12 NOTE — ED Provider Notes (Signed)
CSN: [id number]     Arrival date & time 04/12/15  1752 History  By signing my name below, I, Starleen Arms, attest that this documentation has been prepared under the direction and in the presence of Salina Stanfield, PA-C. Electronically Signed: Starleen Arms ED Scribe. 04/12/2015. 6:31 PM.    Chief Complaint  Patient presents with  . Nasal Congestion  . Cough   The history is provided by the patient. No language interpreter was used.   HPI Comments: Brooke [name] is a 60 y.o. female with hx of non-insulin dependent DM, HTN who presents to the Emergency Department complaining of a persistent, dry cough onset 3 days ago.  Associated symptoms include trouble sleeping due to cough; nasal congestion, scratchy throat; and mild, intermittent rhinorrhea.  The patient has used Mucinex with improved cough.  She denies ear pain, abdominal pain, shortness of breath, fever, chills, other complaints.    Past Medical History  Diagnosis Date  . Diabetes mellitus   . Hypertension   . Vertigo   . Obesity (BMI 35.0-39.9 without comorbidity) [facility])    Past Surgical History  Procedure Laterality Date  . Breast surgery     No family history on file. Social History  Substance Use Topics  . Smoking status: Never Smoker   . Smokeless tobacco: None  . Alcohol Use: No   OB History    No data available     Review of Systems  Constitutional: Negative for fever.  HENT: Positive for congestion, rhinorrhea and sore throat.   Respiratory: Positive for cough.   Gastrointestinal: Negative for abdominal pain.  All other systems reviewed and are negative.   Allergies  Sulfa antibiotics  Home Medications   Prior to Admission medications   Medication Sig Start Date End Date Taking? Authorizing Provider  acetaminophen (TYLENOL) 500 MG tablet Take 500-1,000 mg by mouth daily as needed for mild pain or moderate pain.    Historical Provider, MD  albuterol (VENTOLIN HFA) 108 (90 BASE) MCG/ACT inhaler Inhale 2  puffs into the lungs every 6 (six) hours as needed for wheezing or shortness of breath.    Historical Provider, MD  aspirin 325 MG tablet Take 325 mg by mouth 2 (two) times a week.     Historical Provider, MD  aspirin 81 MG tablet Take 81 mg by mouth daily.    Historical Provider, MD  atenolol (TENORMIN) 100 MG tablet Take 100 mg by mouth every morning.     Historical Provider, MD  benzonatate (TESSALON) 100 MG capsule Take 2 capsules (200 mg total) by mouth 3 (three) times daily as needed for cough. 03/11/13   Evalee Jefferson, PA-C  diphenoxylate-atropine (LOMOTIL) 2.5-0.025 MG per tablet Take 1 tablet by mouth 4 (four) times daily as needed for diarrhea or loose stools. 03/11/13   Evalee Jefferson, PA-C  ferrous sulfate 325 (65 FE) MG tablet Take 325 mg by mouth daily with breakfast.    Historical Provider, MD  glipiZIDE (GLUCOTROL) 5 MG tablet Take by mouth daily before breakfast.    Historical Provider, MD  lisinopril-hydrochlorothiazide (PRINZIDE,ZESTORETIC) 20-25 MG per tablet Take 1 tablet by mouth daily. 11/06/10   Charlena Cross, MD  LOVASTATIN PO Take 2 tablets by mouth at bedtime.    Historical Provider, MD  meclizine (ANTIVERT) 25 MG tablet Take 1 tablet (25 mg total) by mouth 3 (three) times daily as needed for dizziness. [id number]   Delora Fuel, MD  Omega-3 Fatty Acids (FISH OIL) 1000 MG CAPS Take by mouth.  Historical Provider, MD  sitaGLIPtin-metformin (JANUMET) 50-1000 MG per tablet Take 1 tablet by mouth 2 (two) times daily.    Historical Provider, MD   BP 162/82 mmHg  Pulse 80  Temp(Src) 98.6 F (37 C) (Oral)  Resp 16  Ht 5\' 5"  (1.651 m)  Wt 213 lb (96.616 kg)  BMI 35.44 kg/m2  SpO2 100%  LMP 03/16/2012 Physical Exam  Constitutional: She is oriented to person, place, and time. She appears well-developed and well-nourished. No distress.  HENT:  Head: Normocephalic and atraumatic.  Mouth/Throat: Oropharynx is clear and moist.  Eyes: Conjunctivae and EOM are normal.  Neck: Neck supple.  No tracheal deviation present.  Cardiovascular: Normal rate and regular rhythm.   Pulmonary/Chest: Effort normal. No respiratory distress. She has no wheezes. She has no rales.  Mildly coarse lung sounds bilaterally.  No wheezing.  No rales.   Musculoskeletal: Normal range of motion.  Neurological: She is alert and oriented to person, place, and time.  Skin: Skin is warm and dry.  Psychiatric: She has a normal mood and affect. Her behavior is normal.  Nursing note and vitals reviewed.   ED Course  Procedures (including critical care time)  DIAGNOSTIC STUDIES: Oxygen Saturation is 100% on RA, normal by my interpretation.    COORDINATION OF CARE:  6:39 PM Will prescribe antibiotic and anti-tussive.  Patient should stay hydrated.  Return precautions advised.  Patient should f/u with PCP.  Patient acknowledges and agrees with plan.    Labs Review Labs Reviewed - No data to display  Imaging Review No results found. I have personally reviewed and evaluated these images and lab results as part of my medical decision-making.   EKG Interpretation None      MDM   Final diagnoses:  Bronchitis   Pt well appearing.  Vitals stable.  Non-toxic appearing. Pt agrees to PMD f/u.  rx for zithromax and robitussin AC  I personally performed the services described in this documentation, which was scribed in my presence. The recorded information has been reviewed and is accurate.    Kem Parkinson, PA-C 04/15/15 0001  Julianne Rice, MD 04/18/15 2245

## 2015-04-12 NOTE — ED Notes (Addendum)
Pt began having cough and congestion starting Thursday. Pt states her cough is dry and non-productive. NAD noted.    Pt is also requesting to be checked for a UTI because her CBG has been running high.

## 2015-04-12 NOTE — Discharge Instructions (Signed)

## 2015-05-19 ENCOUNTER — Emergency Department (HOSPITAL_COMMUNITY)
Admission: EM | Admit: 2015-05-19 | Discharge: 2015-05-19 | Disposition: A | Discharge status: Home / Self Care | Payer: Self-pay | Attending: Emergency Medicine | Admitting: Emergency Medicine

## 2015-05-19 ENCOUNTER — Encounter (HOSPITAL_COMMUNITY): Payer: Self-pay | Admitting: *Deleted

## 2015-05-19 DIAGNOSIS — Z79899 Other long term (current) drug therapy: Secondary | ICD-10-CM | POA: Insufficient documentation

## 2015-05-19 DIAGNOSIS — Z7984 Long term (current) use of oral hypoglycemic drugs: Secondary | ICD-10-CM | POA: Insufficient documentation

## 2015-05-19 DIAGNOSIS — R42 Dizziness and giddiness: Secondary | ICD-10-CM | POA: Insufficient documentation

## 2015-05-19 DIAGNOSIS — I10 Essential (primary) hypertension: Secondary | ICD-10-CM | POA: Insufficient documentation

## 2015-05-19 DIAGNOSIS — E669 Obesity, unspecified: Secondary | ICD-10-CM | POA: Insufficient documentation

## 2015-05-19 DIAGNOSIS — E1165 Type 2 diabetes mellitus with hyperglycemia: Secondary | ICD-10-CM | POA: Insufficient documentation

## 2015-05-19 DIAGNOSIS — Z7982 Long term (current) use of aspirin: Secondary | ICD-10-CM | POA: Insufficient documentation

## 2015-05-19 DIAGNOSIS — Z791 Long term (current) use of non-steroidal anti-inflammatories (NSAID): Secondary | ICD-10-CM | POA: Insufficient documentation

## 2015-05-19 DIAGNOSIS — R109 Unspecified abdominal pain: Secondary | ICD-10-CM | POA: Insufficient documentation

## 2015-05-19 DIAGNOSIS — R739 Hyperglycemia, unspecified: Secondary | ICD-10-CM

## 2015-05-19 LAB — URINE MICROSCOPIC-ADD ON
BACTERIA UA: NONE SEEN
WBC, UA: NONE SEEN WBC/hpf (ref 0–5)

## 2015-05-19 LAB — URINALYSIS, ROUTINE W REFLEX MICROSCOPIC
Bilirubin Urine: NEGATIVE
Glucose, UA: >1000 mg/dL — AB
Ketones, ur: NEGATIVE mg/dL
LEUKOCYTES UA: NEGATIVE
NITRITE: NEGATIVE
Protein, ur: NEGATIVE mg/dL
pH: 6 (ref 5.0–8.0)

## 2015-05-19 LAB — CBC WITH DIFFERENTIAL/PLATELET
BASOS ABS: 0 10*3/uL (ref 0.0–0.1)
Basophils Relative: 1 %
EOS PCT: 5 %
Eosinophils Absolute: 0.4 10*3/uL (ref 0.0–0.7)
HCT: 40.7 % (ref 36.0–46.0)
Hemoglobin: 13.1 g/dL (ref 12.0–15.0)
Lymphocytes Relative: 31 %
Lymphs Abs: 2.4 10*3/uL (ref 0.7–4.0)
MCH: 26.8 pg (ref 26.0–34.0)
MCHC: 32.2 g/dL (ref 30.0–36.0)
MCV: 83.2 fL (ref 78.0–100.0)
MONO ABS: 0.5 10*3/uL (ref 0.1–1.0)
Monocytes Relative: 6 %
Neutro Abs: 4.5 10*3/uL (ref 1.7–7.7)
Neutrophils Relative %: 58 %
PLATELETS: 437 10*3/uL — AB (ref 150–400)
RBC: 4.89 MIL/uL (ref 3.87–5.11)
RDW: 13.5 % (ref 11.5–15.5)
WBC: 7.8 10*3/uL (ref 4.0–10.5)

## 2015-05-19 LAB — COMPREHENSIVE METABOLIC PANEL
ALT: 28 U/L (ref 14–54)
AST: 21 U/L (ref 15–41)
Albumin: 3.9 g/dL (ref 3.5–5.0)
Alkaline Phosphatase: 80 U/L (ref 38–126)
Anion gap: 9 (ref 5–15)
BUN: 21 mg/dL — ABNORMAL HIGH (ref 6–20)
CO2: 29 mmol/L (ref 22–32)
Calcium: 9.2 mg/dL (ref 8.9–10.3)
Chloride: 96 mmol/L — ABNORMAL LOW (ref 101–111)
Creatinine, Ser: 1.28 mg/dL — ABNORMAL HIGH (ref 0.44–1.00)
GFR, EST AFRICAN AMERICAN: 51 mL/min — AB (ref >60)
GFR, EST NON AFRICAN AMERICAN: 44 mL/min — AB (ref >60)
Glucose, Bld: 369 mg/dL — ABNORMAL HIGH (ref 65–99)
POTASSIUM: 3.8 mmol/L (ref 3.5–5.1)
Sodium: 134 mmol/L — ABNORMAL LOW (ref 135–145)
Total Bilirubin: 0.6 mg/dL (ref 0.3–1.2)
Total Protein: 7.9 g/dL (ref 6.5–8.1)

## 2015-05-19 LAB — CBG MONITORING, ED
GLUCOSE-CAPILLARY: 385 mg/dL — AB (ref 65–99)
GLUCOSE-CAPILLARY: 301 mg/dL — AB (ref 65–99)

## 2015-05-19 MED ORDER — SODIUM CHLORIDE 0.9 % IV BOLUS (SEPSIS)
1000.0000 mL | Freq: Once | INTRAVENOUS | Status: AC
  Administered 2015-05-19: 1000 mL via INTRAVENOUS

## 2015-05-19 MED ORDER — INSULIN ASPART 100 UNIT/ML ~~LOC~~ SOLN
5.0000 [IU] | Freq: Once | SUBCUTANEOUS | Status: AC
  Administered 2015-05-19: 5 [IU] via SUBCUTANEOUS
  Filled 2015-05-19: qty 1

## 2015-05-19 NOTE — ED Notes (Signed)
Pt states that her sugar has been running > 200 for months with increase in the last day to >400. Pt denies any n/v/d.

## 2015-05-19 NOTE — Discharge Instructions (Signed)
Increase glipizide to 15 mg twice a day.  Follow-up with her family doctor as planned this week

## 2015-05-19 NOTE — ED Provider Notes (Signed)
CSN: [id number]     Arrival date & time 05/19/15  1009 History  By signing my name below, I, Dora Sims, attest that this documentation has been prepared under the direction and in the presence of physician practitioner, Milton Ferguson, MD,. Electronically Signed: Dora Sims, Scribe. 05/19/2015. 10:30 AM.    Chief Complaint  Patient presents with  . Hyperglycemia    HPI Comments: Pt complains of hyperglycemia x1 month  Patient is a 60 y.o. female presenting with hyperglycemia. The history is provided by the patient. No language interpreter was used.  Hyperglycemia Blood sugar level PTA:  479 Severity:  Severe Onset quality:  Sudden Duration:  1 month Timing:  Constant Progression:  Worsening Chronicity:  New Diabetes status:  Controlled with oral medications Associated symptoms: abdominal pain (right) and dizziness   Associated symptoms: no chest pain and no fatigue   Abdominal pain:    Location:  R flank and RUQ   Severity:  Mild   Onset quality:  Sudden   Timing:  Unable to specify   Progression:  Unable to specify   Chronicity:  New Dizziness:    Severity:  Moderate   Duration:  1 month   Timing:  Intermittent   Progression:  Unable to specify   HPI Comments: Brooke [name] is a 60 y.o. female with h/o DM and HTN who presents to the Emergency Department complaining of constant hyperglycemia onset one month. Pt reports that she came to the ER today because she measured her blood sugar this morning at 479; she states her blood sugar has regularly been over 200 for the last month. Pt takes Janumet and Glucotrol for her high blood sugar; she takes each medication twice a day. She has been taking Janumet 500 mg since 2015. Pt states that she had a bald cold recently that has resolved. She endorses experiencing associated moderate dizziness with spikes in her blood sugar. Pt also notes mild right-sided abdominal pain. She denies numbness or any other associated  symptoms.  Past Medical History  Diagnosis Date  . Diabetes mellitus   . Hypertension   . Vertigo   . Obesity (BMI 35.0-39.9 without comorbidity) [facility])    Past Surgical History  Procedure Laterality Date  . Breast surgery     No family history on file. Social History  Substance Use Topics  . Smoking status: Never Smoker   . Smokeless tobacco: None  . Alcohol Use: No   OB History    No data available     Review of Systems  Constitutional: Negative for appetite change and fatigue.  HENT: Negative for congestion, ear discharge and sinus pressure.   Eyes: Negative for discharge.  Respiratory: Negative for cough.   Cardiovascular: Negative for chest pain.  Gastrointestinal: Positive for abdominal pain (right). Negative for diarrhea.  Genitourinary: Negative for frequency and hematuria.  Musculoskeletal: Negative for back pain.  Skin: Negative for rash.  Neurological: Positive for dizziness. Negative for seizures and headaches.  Psychiatric/Behavioral: Negative for hallucinations.      Allergies  Review of patient's allergies indicates no active allergies.  Home Medications   Prior to Admission medications   Medication Sig Start Date End Date Taking? Authorizing Provider  acetaminophen (TYLENOL) 500 MG tablet Take 500-1,000 mg by mouth daily as needed for mild pain or moderate pain.    Historical Provider, MD  albuterol (VENTOLIN HFA) 108 (90 BASE) MCG/ACT inhaler Inhale 2 puffs into the lungs every 6 (six) hours as needed for wheezing or shortness  of breath.    Historical Provider, MD  aspirin 325 MG tablet Take 325 mg by mouth 2 (two) times a week.     Historical Provider, MD  aspirin 81 MG tablet Take 81 mg by mouth daily.    Historical Provider, MD  atenolol (TENORMIN) 100 MG tablet Take 100 mg by mouth every morning.     Historical Provider, MD  azithromycin (ZITHROMAX) 250 MG tablet Take first 2 tablets together on day one, then 1 tablet every day until finished.  04/12/15   Tammy Triplett, PA-C  diphenoxylate-atropine (LOMOTIL) 2.5-0.025 MG per tablet Take 1 tablet by mouth 4 (four) times daily as needed for diarrhea or loose stools. 03/11/13   Evalee Jefferson, PA-C  ferrous sulfate 325 (65 FE) MG tablet Take 325 mg by mouth daily with breakfast.    Historical Provider, MD  glipiZIDE (GLUCOTROL) 5 MG tablet Take by mouth daily before breakfast.    Historical Provider, MD  guaiFENesin-codeine (ROBITUSSIN AC) 100-10 MG/5ML syrup Take 10 mLs by mouth at bedtime as needed. 04/12/15   Tammy Triplett, PA-C  lisinopril-hydrochlorothiazide (PRINZIDE,ZESTORETIC) 20-25 MG per tablet Take 1 tablet by mouth daily. 11/06/10   Charlena Cross, MD  LOVASTATIN PO Take 2 tablets by mouth at bedtime.    Historical Provider, MD  meclizine (ANTIVERT) 25 MG tablet Take 1 tablet (25 mg total) by mouth 3 (three) times daily as needed for dizziness. [id number]   Delora Fuel, MD  Omega-3 Fatty Acids (FISH OIL) 1000 MG CAPS Take by mouth.    Historical Provider, MD  sitaGLIPtin-metformin (JANUMET) 50-1000 MG per tablet Take 1 tablet by mouth 2 (two) times daily.    Historical Provider, MD   BP 188/96 mmHg  Pulse 72  Temp(Src) 97.5 F (36.4 C) (Oral)  Resp 16  Ht 5\' 6"  (1.676 m)  Wt 213 lb (96.616 kg)  BMI 34.40 kg/m2  SpO2 100%  LMP 03/16/2012 Physical Exam  Constitutional: She is oriented to person, place, and time. She appears well-developed.  HENT:  Head: Normocephalic.  Eyes: Conjunctivae and EOM are normal. No scleral icterus.  Neck: Neck supple. No thyromegaly present.  Cardiovascular: Normal rate and regular rhythm.  Exam reveals no gallop and no friction rub.   No murmur heard. Pulmonary/Chest: No stridor. She has no wheezes. She has no rales. She exhibits no tenderness.  Abdominal: She exhibits no distension. There is no tenderness. There is no rebound.  Musculoskeletal: Normal range of motion. She exhibits no edema.  Lymphadenopathy:    She has no cervical adenopathy.   Neurological: She is oriented to person, place, and time. She exhibits normal muscle tone. Coordination normal.  Skin: No rash noted. No erythema.  Psychiatric: She has a normal mood and affect. Her behavior is normal.    ED Course  Procedures (including critical care time)  DIAGNOSTIC STUDIES: Oxygen Saturation is 100% on RA, normal by my interpretation.    COORDINATION OF CARE: 10:30 AM Will administer fluids. Will order blood work. Discussed treatment plan with pt at bedside and pt agreed to plan.   Labs Review Labs Reviewed  CBG MONITORING, ED - Abnormal; Notable for the following:    Glucose-Capillary 385 (*)    All other components within normal limits    Imaging Review No results found. I have personally reviewed and evaluated these lab results as part of my medical decision-making.   EKG Interpretation None      MDM   Final diagnoses:  None   patient  with hyperglycemia she will increase her glipizide to 15 mg twice a day and follow-up with her PCP this week   The chart was scribed for me under my direct supervision.  I personally performed the history, physical, and medical decision making and all procedures in the evaluation of this patient.Milton Ferguson, MD 05/19/15 [phone]

## 2015-05-19 NOTE — ED Notes (Signed)
Pt c/o high blood sugars for over a month, states she came in today because her machine read over 400. Pt denies n/v.

## 2015-05-19 NOTE — ED Notes (Signed)
MD at bedside. 

## 2015-09-04 ENCOUNTER — Encounter (HOSPITAL_COMMUNITY): Payer: Self-pay

## 2015-09-04 ENCOUNTER — Emergency Department (HOSPITAL_COMMUNITY)
Admission: EM | Admit: 2015-09-04 | Discharge: 2015-09-05 | Disposition: A | Discharge status: Home / Self Care | Payer: Self-pay | Attending: Emergency Medicine | Admitting: Emergency Medicine

## 2015-09-04 DIAGNOSIS — Z7984 Long term (current) use of oral hypoglycemic drugs: Secondary | ICD-10-CM | POA: Insufficient documentation

## 2015-09-04 DIAGNOSIS — E669 Obesity, unspecified: Secondary | ICD-10-CM | POA: Insufficient documentation

## 2015-09-04 DIAGNOSIS — E119 Type 2 diabetes mellitus without complications: Secondary | ICD-10-CM | POA: Insufficient documentation

## 2015-09-04 DIAGNOSIS — Z7982 Long term (current) use of aspirin: Secondary | ICD-10-CM | POA: Insufficient documentation

## 2015-09-04 DIAGNOSIS — I10 Essential (primary) hypertension: Secondary | ICD-10-CM | POA: Insufficient documentation

## 2015-09-04 DIAGNOSIS — M7552 Bursitis of left shoulder: Secondary | ICD-10-CM | POA: Insufficient documentation

## 2015-09-04 DIAGNOSIS — R42 Dizziness and giddiness: Secondary | ICD-10-CM

## 2015-09-04 NOTE — ED Notes (Signed)
Patient c/o dizziness "for years" and tonight it got worse, also c/o left shoulder X3 week. And pain to right lower back.

## 2015-09-05 MED ORDER — MECLIZINE HCL 25 MG PO TABS: 25.0000 mg | ORAL_TABLET | Freq: Four times a day (QID) | ORAL | Status: DC | PRN

## 2015-09-05 MED ORDER — MECLIZINE HCL 12.5 MG PO TABS
25.0000 mg | ORAL_TABLET | Freq: Once | ORAL | Status: AC
  Administered 2015-09-05: 25 mg via ORAL
  Filled 2015-09-05: qty 2

## 2015-09-05 NOTE — Discharge Instructions (Signed)
Use ice and heat on the sore muscles and joints. Continue the acetaminophen for pain. Use the meclizine for dizziness. Recheck if you get a headache, vomiting, fever.    Bursitis Bursitis is when the fluid-filled sac (bursa) that covers and protects a joint is swollen (inflamed). Bursitis is most common near joints, especially the knees, elbows, hips, and shoulders.  HOME CARE  Take medicines only as told by your doctor.  If you were prescribed an antibiotic medicine, finish it all even if you start to feel better.  Rest the affected area as told by your doctor.  Keep the area raised up.  Avoid doing things that make the pain worse.  Apply ice to the injured area:  Place ice in a plastic bag.  Place a towel between your skin and the bag.  Leave the ice on for 20 minutes, 2-3 times a day.  Use splints, braces, pads, or walking aids as told by your doctor.  Keep all follow-up visits as told by your doctor. This is important. GET HELP IF:   You have more pain with home care.  You have a fever.  You have chills.   This information is not intended to replace advice given to you by your health care provider. Make sure you discuss any questions you have with your health care provider.   Document Released: 08/11/2009 Document Revised: 03/14/2014 Document Reviewed: 05/13/2013 Elsevier Interactive Patient Education 2016 [place] therapy can help ease sore, stiff, injured, and tight muscles and joints. Heat relaxes your muscles, which may help ease your pain. Heat therapy should only be used on old, pre-existing, or long-lasting (chronic) injuries. Do not use heat therapy unless told by your doctor. HOW TO USE HEAT THERAPY There are several different kinds of heat therapy, including:  Moist heat pack.  Warm water bath.  Hot water bottle.  Electric heating pad.  Heated gel pack.  Heated wrap.  Electric heating pad. GENERAL HEAT THERAPY  RECOMMENDATIONS   Do not sleep while using heat therapy. Only use heat therapy while you are awake.  Your skin may turn pink while using heat therapy. Do not use heat therapy if your skin turns red.  Do not use heat therapy if you have new pain.  High heat or long exposure to heat can cause burns. Be careful when using heat therapy to avoid burning your skin.  Do not use heat therapy on areas of your skin that are already irritated, such as with a rash or sunburn. GET HELP IF:   You have blisters, redness, swelling (puffiness), or numbness.  You have new pain.  Your pain is worse. MAKE SURE YOU:  Understand these instructions.  Will watch your condition.  Will get help right away if you are not doing well or get worse.   This information is not intended to replace advice given to you by your health care provider. Make sure you discuss any questions you have with your health care provider.   Document Released: 05/16/2011 Document Revised: 03/14/2014 Document Reviewed: 04/16/2013 Elsevier Interactive Patient Education 2016 [employer].  Vertigo Vertigo means that you feel like you are moving when you are not. Vertigo can also make you feel like things around you are moving when they are not. This feeling can come and go at any time. Vertigo often goes away on its own. HOME CARE  Avoid making fast movements.  Avoid driving.  Avoid using heavy machinery.  Avoid doing any  task or activity that might cause danger to you or other people if you would have a vertigo attack while you are doing it.  Sit down right away if you feel dizzy or have trouble with your balance.  Take over-the-counter and prescription medicines only as told by your doctor.  Follow instructions from your doctor about which positions or movements you should avoid.  Drink enough fluid to keep your pee (urine) clear or pale yellow.  Keep all follow-up visits as told by your doctor. This is  important. GET HELP IF:  Medicine does not help your vertigo.  You have a fever.  Your problems get worse or you have new symptoms.  Your family or friends see changes in your behavior.  You feel sick to your stomach (nauseous) or you throw up (vomit).  You have a "pins and needles" feeling or you are numb in part of your body. GET HELP RIGHT AWAY IF:  You have trouble moving or talking.  You are always dizzy.  You pass out (faint).  You get very bad headaches.  You feel weak or have trouble using your hands, arms, or legs.  You have changes in your hearing.  You have changes in your seeing (vision).  You get a stiff neck.  Bright light starts to bother you.   This information is not intended to replace advice given to you by your health care provider. Make sure you discuss any questions you have with your health care provider.   Document Released: 12/01/2007 Document Revised: 11/12/2014 Document Reviewed: 06/16/2014 Elsevier Interactive Patient Education [employer].

## 2015-09-05 NOTE — ED Provider Notes (Addendum)
CSN: [id number]     Arrival date & time 09/04/15  2148 History  By signing my name below, I, Brooke [name], attest that this documentation has been prepared under the direction and in the presence of physician practitioner, Rolland Porter, MD at 00:27 AM. Electronically Signed: [name], Scribe. 09/05/2015. 12:27 AM.   Chief Complaint  Patient presents with  . Dizziness    The history is provided by the patient. No language interpreter was used.     HPI Comments: Brooke [name] is a 60 y.o. female with PMHx of vertigo, DM, and HTN who presents to the Emergency Department complaining of sudden onset, intermittent, dizziness beginning around 7 pm tonight. Pt reports that she has experienced ongoing "wooziness" and feeling off balance for several years. She states that she stood up out of bed tonight and experienced room spinning; she notes she had to be held to avoid falling and the episode lasted for a couple of minutes. Pt states that she has never seen a neurologist for her symptoms but has been seen here several times for the same reason. She denies nausea, vomiting, headache, or any other associated symptoms.  Pt also complains of constant left shoulder pain onset three weeks. She endorses pain exacerbation with left arm raising and neck twisting; she states that she could not turn her head to the left also for the past 3 weeks but it has been improving and  this has resolved completely. Pt notes that she has had left shoulder bursitis and a right rotator cuff injury in the past but has never seen an orthopedist. She has applied heat to her left shoulder and taken Tylenol with gradual improvement. Pt does not smoke or drink. She reports that she worked in [employer] over 10 years ago but has not been employed since. She denies recent injury or trauma to her left shoulder.  Pt notes that her blood sugar has been in the 200's recently but has improved over the last several days; she measured it at  171 five hours PTA.   PCP: [facility]  Past Medical History  Diagnosis Date  . Diabetes mellitus   . Hypertension   . Vertigo   . Obesity (BMI 35.0-39.9 without comorbidity) [facility])    Past Surgical History  Procedure Laterality Date  . Breast surgery     History reviewed. No pertinent family history. Social History  Substance Use Topics  . Smoking status: Never Smoker   . Smokeless tobacco: None  . Alcohol Use: No  unemployed  OB History    No data available     Review of Systems  Gastrointestinal: Negative for nausea and vomiting.  Musculoskeletal: Positive for arthralgias (left shoulder).  Neurological: Positive for dizziness. Negative for headaches.  All other systems reviewed and are negative.   Allergies  Review of patient's allergies indicates no active allergies.  Home Medications   Prior to Admission medications   Medication Sig Start Date End Date Taking? Authorizing Provider  acetaminophen (TYLENOL) 500 MG tablet Take 500-1,000 mg by mouth daily as needed for mild pain or moderate pain.    Historical Provider, MD  albuterol (VENTOLIN HFA) 108 (90 BASE) MCG/ACT inhaler Inhale 2 puffs into the lungs every 6 (six) hours as needed for wheezing or shortness of breath.    Historical Provider, MD  amLODipine (NORVASC) 5 MG tablet Take 5 mg by mouth daily.    Historical Provider, MD  aspirin EC 81 MG tablet Take 81 mg by  mouth daily.    Historical Provider, MD  atenolol (TENORMIN) 100 MG tablet Take 100 mg by mouth every morning.     Historical Provider, MD  glipiZIDE (GLUCOTROL) 10 MG tablet Take 10 mg by mouth 2 (two) times daily before a meal.    Historical Provider, MD  guaiFENesin-codeine (ROBITUSSIN AC) 100-10 MG/5ML syrup Take 10 mLs by mouth at bedtime as needed. Patient not taking: Reported on 05/19/2015 04/12/15   Tammy Triplett, PA-C  ibuprofen (ADVIL,MOTRIN) 200 MG tablet Take 400 mg by mouth every 4 (four) hours as needed (arm and  back pain).    Historical Provider, MD  lisinopril-hydrochlorothiazide (PRINZIDE,ZESTORETIC) 20-25 MG per tablet Take 1 tablet by mouth daily. 11/06/10   Charlena Cross, MD  lovastatin (MEVACOR) 20 MG tablet Take 40 mg by mouth at bedtime.    Historical Provider, MD  meclizine (ANTIVERT) 25 MG tablet Take 1 tablet (25 mg total) by mouth 4 (four) times daily as needed for dizziness. 09/05/15   Rolland Porter, MD  Omega-3 Fatty Acids (FISH OIL) 1000 MG CAPS Take 1,000 mg by mouth daily.     Historical Provider, MD  polyvinyl alcohol-povidone (REFRESH) 1.4-0.6 % ophthalmic solution Place 1 drop into both eyes as needed.    Historical Provider, MD  sitaGLIPtin-metformin (JANUMET) 50-1000 MG per tablet Take 1 tablet by mouth 2 (two) times daily.    Historical Provider, MD   BP 152/115 mmHg  Pulse 78  Temp(Src) 98.6 F (37 C) (Oral)  Resp 20  Ht 5\' 5"  (1.651 m)  Wt 213 lb (96.616 kg)  BMI 35.44 kg/m2  SpO2 96%  LMP 03/16/2012  Vital signs normal except hypertension  Physical Exam  Constitutional: She is oriented to person, place, and time. She appears well-developed and well-nourished.  Non-toxic appearance. She does not appear ill. No distress.  HENT:  Head: Normocephalic and atraumatic.  Right Ear: External ear normal.  Left Ear: External ear normal.  Nose: Nose normal. No mucosal edema or rhinorrhea.  Mouth/Throat: Oropharynx is clear and moist and mucous membranes are normal. No dental abscesses or uvula swelling.  Eyes: Conjunctivae and EOM are normal. Pupils are equal, round, and reactive to light.  No nystagmus.  Neck: Normal range of motion and full passive range of motion without pain. Neck supple.  Mild tenderness of the left trapezius muscle; pain on abduction of left shoulder, ROM intact.  Cardiovascular: Normal rate, regular rhythm and normal heart sounds.  Exam reveals no gallop and no friction rub.   No murmur heard. Pulmonary/Chest: Effort normal and breath sounds normal. No  respiratory distress. She has no wheezes. She has no rhonchi. She has no rales. She exhibits no tenderness and no crepitus.  Abdominal: Soft. Normal appearance and bowel sounds are normal. She exhibits no distension. There is no tenderness. There is no rebound and no guarding.  Musculoskeletal: Normal range of motion. She exhibits no edema or tenderness.  Moves all extremities well.   Neurological: She is alert and oriented to person, place, and time. She has normal strength. No cranial nerve deficit.  Skin: Skin is warm, dry and intact. No rash noted. No erythema. No pallor.  Psychiatric: She has a normal mood and affect. Her speech is normal and behavior is normal. Her mood appears not anxious.  Nursing note and vitals reviewed.   ED Course  Procedures (including critical care time) Medications  meclizine (ANTIVERT) tablet 25 mg (25 mg Oral Given 09/05/15 0059)     DIAGNOSTIC STUDIES:  Oxygen Saturation is 96% on RA, adequate by my interpretation.    COORDINATION OF CARE: 1:08 AM Discussed treatment plan with pt at bedside and pt agreed to plan.  Pt didn't want xrays (no insurance) or even a CBG to be done. We discussed continuing the heat and acetaminophen and to add ice. She was started on meclizine for dizziness. Pt has underlying renal insufficiency so NSAID were not recommended for her bursitis.  Results for orders placed or performed during the hospital encounter of 05/19/15  CBC with Differential/Platelet  Comprehensive metabolic panel  Result Value Ref Range   Sodium 134 (L) 135 - 145 mmol/L   Potassium 3.8 3.5 - 5.1 mmol/L   Chloride 96 (L) 101 - 111 mmol/L   CO2 29 22 - 32 mmol/L   Glucose, Bld 369 (H) 65 - 99 mg/dL   BUN 21 (H) 6 - 20 mg/dL   Creatinine, Ser 1.28 (H) 0.44 - 1.00 mg/dL   Calcium 9.2 8.9 - 10.3 mg/dL   Total Protein 7.9 6.5 - 8.1 g/dL   Albumin 3.9 3.5 - 5.0 g/dL   AST 21 15 - 41 U/L   ALT 28 14 - 54 U/L   Alkaline Phosphatase 80 38 - 126 U/L   Total  Bilirubin 0.6 0.3 - 1.2 mg/dL   GFR calc non Af Amer 44 (L) >60 mL/min   GFR calc Af Amer 51 (L) >60 mL/min   Anion gap 9 5 - 15    EKG Interpretation  Date/Time:  Friday September 04 2015 22:55:27 EDT Ventricular Rate:  69 PR Interval:    QRS Duration: 81 QT Interval:  519 QTC Calculation: 557 R Axis:   47 Text Interpretation:  Sinus rhythm Nonspecific T abnrm, anterolateral leads Prolonged QT interval No significant change since last tracing 02 Dec 2014 Confirmed by Shauntae Reitman  MD-I, Eugena Rhue ([id number]) on 09/04/2015 11:04:35 PM         MDM   Final diagnoses:  Vertigo  Bursitis, shoulder, left    Discharge Medication List as of 09/05/2015 12:48 AM    meclizine 25 mg QID prn  Plan discharge  Rolland Porter, MD, Barbette Or, MD 09/05/15 [id number]  Rolland Porter, MD 09/05/15 [phone]

## 2015-10-06 ENCOUNTER — Encounter (HOSPITAL_COMMUNITY): Payer: Self-pay | Admitting: Emergency Medicine

## 2015-10-06 ENCOUNTER — Emergency Department (HOSPITAL_COMMUNITY): Payer: Self-pay

## 2015-10-06 ENCOUNTER — Emergency Department (HOSPITAL_COMMUNITY)
Admission: EM | Admit: 2015-10-06 | Discharge: 2015-10-06 | Disposition: A | Discharge status: Home / Self Care | Payer: Self-pay | Attending: Emergency Medicine | Admitting: Emergency Medicine

## 2015-10-06 DIAGNOSIS — Z7984 Long term (current) use of oral hypoglycemic drugs: Secondary | ICD-10-CM | POA: Insufficient documentation

## 2015-10-06 DIAGNOSIS — Z79899 Other long term (current) drug therapy: Secondary | ICD-10-CM | POA: Insufficient documentation

## 2015-10-06 DIAGNOSIS — I10 Essential (primary) hypertension: Secondary | ICD-10-CM | POA: Insufficient documentation

## 2015-10-06 DIAGNOSIS — Z7982 Long term (current) use of aspirin: Secondary | ICD-10-CM | POA: Insufficient documentation

## 2015-10-06 DIAGNOSIS — J40 Bronchitis, not specified as acute or chronic: Secondary | ICD-10-CM | POA: Insufficient documentation

## 2015-10-06 DIAGNOSIS — E119 Type 2 diabetes mellitus without complications: Secondary | ICD-10-CM | POA: Insufficient documentation

## 2015-10-06 MED ORDER — AMOXICILLIN 500 MG PO CAPS: 500.0000 mg | ORAL_CAPSULE | Freq: Three times a day (TID) | ORAL | 0 refills | Status: DC

## 2015-10-06 NOTE — ED Triage Notes (Addendum)
Pt reports wheezing and coughing x2 weeks, productive.  Pt states she does not have asthma, and does not have any inhaler.  Pt has wheezes in right lung base, pt in no acute distress, respirations even.

## 2015-10-06 NOTE — ED Provider Notes (Signed)
[place] DEPT Provider Note   CSN: [id number] Arrival date & time: 10/06/15  1130  First Provider Contact:  First MD Initiated Contact with Patient 10/06/15 1208     By signing my name below, I, Julien Nordmann, attest that this documentation has been prepared under the direction and in the presence of Milton Ferguson, MD.  Electronically Signed: Julien Nordmann, ED Scribe. 10/06/15. 12:23 PM.    History   Chief Complaint Chief Complaint  Patient presents with  . Cough    The history is provided by the patient. No language interpreter was used.  Cough  This is a new problem. The current episode started more than 2 days ago. The problem occurs constantly. The problem has not changed since onset.The cough is productive of sputum. There has been no fever. Associated symptoms include wheezing. Pertinent negatives include no chest pain and no headaches. She has tried nothing for the symptoms. She is not a smoker. Her past medical history does not include pneumonia.   HPI Comments: Brooke [name] is a 60 y.o. female who presents to the Emergency Department complaining of sudden onset, gradual worsening, moderate, productive cough that brings up white and yellow phlegm onset 2 weeks ago.  She notes associated wheezing. She has not used an inhaler to alleviate her symptoms. Pt denies any other complaints. Pt is a non-smoker.  Past Medical History:  Diagnosis Date  . Diabetes mellitus   . Hypertension   . Obesity (BMI 35.0-39.9 without comorbidity) ([place])   . Vertigo     There are no active problems to display for this patient.   Past Surgical History:  Procedure Laterality Date  . BREAST SURGERY      OB History    No data available       Home Medications    Prior to Admission medications   Medication Sig Start Date End Date Taking? Authorizing Provider  acetaminophen (TYLENOL) 500 MG tablet Take 500-1,000 mg by mouth daily as needed for mild pain or moderate pain.   Yes  Historical Provider, MD  amLODipine (NORVASC) 10 MG tablet Take 10 mg by mouth daily.   Yes Historical Provider, MD  aspirin EC 81 MG tablet Take 81 mg by mouth daily.   Yes Historical Provider, MD  atenolol (TENORMIN) 100 MG tablet Take 100 mg by mouth every morning.    Yes Historical Provider, MD  glipiZIDE (GLUCOTROL) 10 MG tablet Take 10 mg by mouth 2 (two) times daily before a meal.   Yes Historical Provider, MD  lisinopril-hydrochlorothiazide (PRINZIDE,ZESTORETIC) 20-25 MG per tablet Take 1 tablet by mouth daily. 11/06/10  Yes Charlena Cross, MD  lovastatin (MEVACOR) 20 MG tablet Take 40 mg by mouth at bedtime.   Yes Historical Provider, MD  meclizine (ANTIVERT) 25 MG tablet Take 1 tablet (25 mg total) by mouth 4 (four) times daily as needed for dizziness. 09/05/15  Yes Rolland Porter, MD  Omega-3 Fatty Acids (FISH OIL) 1000 MG CAPS Take 1,000 mg by mouth daily.    Yes Historical Provider, MD  polyvinyl alcohol-povidone (REFRESH) 1.4-0.6 % ophthalmic solution Place 1 drop into both eyes as needed (dry eyes/irritation).    Yes Historical Provider, MD  sitaGLIPtin-metformin (JANUMET) 50-1000 MG per tablet Take 1 tablet by mouth 2 (two) times daily.   Yes Historical Provider, MD    Family History History reviewed. No pertinent family history.  Social History Social History  Substance Use Topics  . Smoking status: Never Smoker  . Smokeless tobacco: Not  on file  . Alcohol use No     Allergies   Review of patient's allergies indicates no known allergies.   Review of Systems Review of Systems  Constitutional: Negative for appetite change and fatigue.  HENT: Negative for congestion, ear discharge and sinus pressure.   Eyes: Negative for discharge.  Respiratory: Positive for cough and wheezing.   Cardiovascular: Negative for chest pain.  Gastrointestinal: Negative for abdominal pain and diarrhea.  Genitourinary: Negative for frequency and hematuria.  Musculoskeletal: Negative for back  pain.  Skin: Negative for rash.  Neurological: Negative for seizures and headaches.  Psychiatric/Behavioral: Negative for hallucinations.  All other systems reviewed and are negative.    Physical Exam Updated Vital Signs BP 163/83 (BP Location: Left Arm)   Pulse 88   Temp 97.5 F (36.4 C) (Oral)   Resp 16   Ht 5\' 6"  (1.676 m)   Wt 212 lb (96.2 kg)   LMP 03/16/2012   SpO2 99%   BMI 34.22 kg/m   Physical Exam  Constitutional: She is oriented to person, place, and time. She appears well-developed.  HENT:  Head: Normocephalic.  Eyes: Conjunctivae and EOM are normal. No scleral icterus.  Neck: Neck supple. No thyromegaly present.  Cardiovascular: Normal rate and regular rhythm.  Exam reveals no gallop and no friction rub.   No murmur heard. Pulmonary/Chest: No stridor. She has wheezes. She has no rales. She exhibits no tenderness.  Minimal expiratory wheezes  Abdominal: She exhibits no distension. There is no tenderness. There is no rebound.  Musculoskeletal: Normal range of motion. She exhibits no edema.  Lymphadenopathy:    She has no cervical adenopathy.  Neurological: She is oriented to person, place, and time. She exhibits normal muscle tone. Coordination normal.  Skin: No rash noted. No erythema.  Psychiatric: She has a normal mood and affect. Her behavior is normal.  Nursing note and vitals reviewed.    ED Treatments / Results  DIAGNOSTIC STUDIES: Oxygen Saturation is 99% on RA, normal by my interpretation.  COORDINATION OF CARE:  12:19 PM Discussed treatment plan with pt at bedside and pt agreed to plan.  Labs (all labs ordered are listed, but only abnormal results are displayed) Labs Reviewed - No data to display  EKG  EKG Interpretation None       Radiology No results found.  Procedures Procedures (including critical care time)  Medications Ordered in ED Medications - No data to display   Initial Impression / Assessment and Plan / ED Course    I have reviewed the triage vital signs and the nursing notes.  Pertinent labs & imaging results that were available during my care of the patient were reviewed by me and considered in my medical decision making (see chart for details).  Clinical Course    Chest x-ray unremarkable. Patient with bronchitis she will be treated with amoxicillin will follow-up with PCP  Final Clinical Impressions(s) / ED Diagnoses   Final diagnoses:  None   Milton Ferguson, MD  The chart was scribed for me under my direct supervision.  I personally performed the history, physical, and medical decision making and all procedures in the evaluation of this patient..   New Prescriptions New Prescriptions   No medications on file     Milton Ferguson, MD 10/06/15 1338

## 2015-10-06 NOTE — Discharge Instructions (Signed)
Follow-up with the family doctor next week if not improving. Drink plenty of fluids Tylenol for any fever

## 2016-09-01 ENCOUNTER — Encounter (HOSPITAL_COMMUNITY): Payer: Self-pay | Admitting: Cardiology

## 2016-09-01 ENCOUNTER — Emergency Department (HOSPITAL_COMMUNITY)
Admission: EM | Admit: 2016-09-01 | Discharge: 2016-09-01 | Disposition: A | Discharge status: Home / Self Care | Payer: Self-pay | Attending: Emergency Medicine | Admitting: Emergency Medicine

## 2016-09-01 DIAGNOSIS — I10 Essential (primary) hypertension: Secondary | ICD-10-CM | POA: Insufficient documentation

## 2016-09-01 DIAGNOSIS — Z79899 Other long term (current) drug therapy: Secondary | ICD-10-CM | POA: Insufficient documentation

## 2016-09-01 DIAGNOSIS — R42 Dizziness and giddiness: Secondary | ICD-10-CM

## 2016-09-01 DIAGNOSIS — I493 Ventricular premature depolarization: Secondary | ICD-10-CM | POA: Insufficient documentation

## 2016-09-01 DIAGNOSIS — Z7984 Long term (current) use of oral hypoglycemic drugs: Secondary | ICD-10-CM | POA: Insufficient documentation

## 2016-09-01 DIAGNOSIS — E119 Type 2 diabetes mellitus without complications: Secondary | ICD-10-CM | POA: Insufficient documentation

## 2016-09-01 DIAGNOSIS — Z7982 Long term (current) use of aspirin: Secondary | ICD-10-CM | POA: Insufficient documentation

## 2016-09-01 LAB — BASIC METABOLIC PANEL
Anion gap: 9 (ref 5–15)
BUN: 18 mg/dL (ref 6–20)
CALCIUM: 9 mg/dL (ref 8.9–10.3)
CO2: 23 mmol/L (ref 22–32)
CREATININE: 1.31 mg/dL — AB (ref 0.44–1.00)
Chloride: 101 mmol/L (ref 101–111)
GFR calc Af Amer: 49 mL/min — ABNORMAL LOW (ref >60)
GFR, EST NON AFRICAN AMERICAN: 43 mL/min — AB (ref >60)
GLUCOSE: 254 mg/dL — AB (ref 65–99)
Potassium: 4.1 mmol/L (ref 3.5–5.1)
SODIUM: 133 mmol/L — AB (ref 135–145)

## 2016-09-01 LAB — CBC
HCT: 38.1 % (ref 36.0–46.0)
Hemoglobin: 12.5 g/dL (ref 12.0–15.0)
MCH: 26.6 pg (ref 26.0–34.0)
MCHC: 32.8 g/dL (ref 30.0–36.0)
MCV: 81.1 fL (ref 78.0–100.0)
PLATELETS: 388 10*3/uL (ref 150–400)
RBC: 4.7 MIL/uL (ref 3.87–5.11)
RDW: 14 % (ref 11.5–15.5)
WBC: 7.2 10*3/uL (ref 4.0–10.5)

## 2016-09-01 LAB — I-STAT TROPONIN, ED: TROPONIN I, POC: 0 ng/mL (ref 0.00–0.08)

## 2016-09-01 NOTE — ED Provider Notes (Signed)
[place] DEPT Provider Note   CSN: [id number] Arrival date & time: 09/01/16  1001     History   Chief Complaint No chief complaint on file.   HPI Brooke [name] is a 61 y.o. female.  HPI  61 y.o. female with a hx of DM, HTN, Obesity, Vertigo, presents to the Emergency Department today due to dizziness as well as palpitations x 2 weeks. Noted dizziness yesterday. Noted hx same with dizziness and palpitations. Denies CP/SOB/ABD pain. No headaches. No syncope or near syncope. No N/V. No vision changes. Pt states she has been seen in the past for dizziness with palpitations. Seen in ED for dizziness on 09-04-16. Pt has seen Cardiology in [facility] for the same with negative Echocardiograms as well as Holter Monitors per patient. Denies dizziness currently. Notes intermittent palpitation currently. No other symptoms noted.   Past Medical History:  Diagnosis Date  . Diabetes mellitus   . Hypertension   . Obesity (BMI 35.0-39.9 without comorbidity)   . Vertigo     There are no active problems to display for this patient.   Past Surgical History:  Procedure Laterality Date  . BREAST SURGERY      OB History    No data available       Home Medications    Prior to Admission medications   Medication Sig Start Date End Date Taking? Authorizing Provider  acetaminophen (TYLENOL) 500 MG tablet Take 500-1,000 mg by mouth daily as needed for mild pain or moderate pain.    [provider]  amLODipine (NORVASC) 10 MG tablet Take 10 mg by mouth daily.    [provider]  amoxicillin (AMOXIL) 500 MG capsule Take 1 capsule (500 mg total) by mouth 3 (three) times daily. 10/06/15   Milton Ferguson, MD  aspirin EC 81 MG tablet Take 81 mg by mouth daily.    [provider]  atenolol (TENORMIN) 100 MG tablet Take 100 mg by mouth every morning.     [provider]  glipiZIDE (GLUCOTROL) 10 MG tablet Take 10 mg by mouth 2 (two) times daily before a  meal.    [provider]  lisinopril-hydrochlorothiazide (PRINZIDE,ZESTORETIC) 20-25 MG per tablet Take 1 tablet by mouth daily. 11/06/10   Charlena Cross, MD  lovastatin (MEVACOR) 20 MG tablet Take 40 mg by mouth at bedtime.    [provider]  meclizine (ANTIVERT) 25 MG tablet Take 1 tablet (25 mg total) by mouth 4 (four) times daily as needed for dizziness. 09/05/15   Rolland Porter, MD  Omega-3 Fatty Acids (FISH OIL) 1000 MG CAPS Take 1,000 mg by mouth daily.     [provider]  polyvinyl alcohol-povidone (REFRESH) 1.4-0.6 % ophthalmic solution Place 1 drop into both eyes as needed (dry eyes/irritation).     [provider]  sitaGLIPtin-metformin (JANUMET) 50-1000 MG per tablet Take 1 tablet by mouth 2 (two) times daily.    [provider]    Family History No family history on file.  Social History Social History  Substance Use Topics  . Smoking status: Never Smoker  . Smokeless tobacco: Not on file  . Alcohol use No     Allergies   Patient has no known allergies.   Review of Systems Review of Systems ROS reviewed and all are negative for acute change except as noted in the HPI.  Physical Exam Updated Vital Signs BP 120/69   Pulse 62   Resp 18   Ht 5\' 6"  (1.676  m)   Wt 96.2 kg (212 lb)   LMP 03/16/2012   SpO2 100%   BMI 34.22 kg/m   Physical Exam  Constitutional: She is oriented to person, place, and time. Vital signs are normal. She appears well-developed and well-nourished. No distress.  HENT:  Head: Normocephalic and atraumatic.  Right Ear: Hearing, tympanic membrane, external ear and ear canal normal.  Left Ear: Hearing, tympanic membrane, external ear and ear canal normal.  Nose: Nose normal.  Mouth/Throat: Uvula is midline, oropharynx is clear and moist and mucous membranes are normal. No trismus in the jaw. No oropharyngeal exudate, posterior oropharyngeal erythema or tonsillar abscesses.  Eyes: Conjunctivae and EOM  are normal. Pupils are equal, round, and reactive to light.  Neck: Normal range of motion. Neck supple. No tracheal deviation present.  Cardiovascular: Normal rate, regular rhythm, S1 normal, S2 normal, normal heart sounds, intact distal pulses and normal pulses.   Pulmonary/Chest: Effort normal and breath sounds normal. No respiratory distress. She has no decreased breath sounds. She has no wheezes. She has no rhonchi. She has no rales.  Abdominal: Normal appearance and bowel sounds are normal. There is no tenderness.  Musculoskeletal: Normal range of motion.  Neurological: She is alert and oriented to person, place, and time.  Cranial Nerves:  II: Pupils equal, round, reactive to light III,IV, VI: ptosis not present, extra-ocular motions intact bilaterally  V,VII: smile symmetric, facial light touch sensation equal VIII: hearing grossly normal bilaterally  IX,X: midline uvula rise  XI: bilateral shoulder shrug equal and strong XII: midline tongue extension Negative pronator drift.  Nystagmus horizontal in both eyes Finger to nose exam unremarkable   Skin: Skin is warm and dry.  Psychiatric: She has a normal mood and affect. Her speech is normal and behavior is normal. Thought content normal.   ED Treatments / Results  Labs (all labs ordered are listed, but only abnormal results are displayed) Labs Reviewed  BASIC METABOLIC PANEL - Abnormal; Notable for the following:       Result Value   Sodium 133 (*)    Glucose, Bld 254 (*)    Creatinine, Ser 1.31 (*)    GFR calc non Af Amer 43 (*)    GFR calc Af Amer 49 (*)    All other components within normal limits  CBC  I-STAT TROPOININ, ED    EKG  EKG Interpretation None       Radiology No results found.  Procedures Procedures (including critical care time)  Medications Ordered in ED Medications - No data to display   Initial Impression / Assessment and Plan / ED Course  I have reviewed the triage vital signs and the  nursing notes.  Pertinent labs & imaging results that were available during my care of the patient were reviewed by me and considered in my medical decision making (see chart for details).  Final Clinical Impressions(s) / ED Diagnoses  {I have reviewed and evaluated the relevant laboratory values. {I have interpreted the relevant EKG. {I have reviewed the relevant previous healthcare records.  {I obtained HPI from historian.   ED Course:  Assessment: Pt is a 61 y.o. female with hx DM, HTN, Obesity, Vertigo who presents with dizziness and palpitations. Hx same. Seen by Cardiology with negative work up. Told likely PVC. No CP/SOB. No N/V. No headache. No vision changes. On exam, pt in NAD. Nontoxic/nonseptic appearing. VSS. Afebrile. Lungs CTA. Heart RRR. Abdomen nontender soft. CN evaluated and unremarkable. Horizontal nystagmus noted. CBC unremarkable.  BMP unremarkable. Trop negative. EKG WNL. Monitor in room showed infrequent PVCs. Pt does not recent intake of caffeine that she was told not to take that can exacerbate her PVCs. Pt has hx of same without unremarkable work up. Plan is to DC home with follow up to Cardiology. At time of discharge, Patient is in no acute distress. Vital Signs are stable. Patient is able to ambulate. Patient able to tolerate PO.   Disposition/Plan:  DC Home Additional Verbal discharge instructions given and discussed with patient.  Pt Instructed to f/u with Cardiology in the next week for evaluation and treatment of symptoms. Return precautions given Pt acknowledges and agrees with plan  Supervising Physician Milton Ferguson, MD  Final diagnoses:  PVC (premature ventricular contraction)  Dizziness    New Prescriptions New Prescriptions   No medications on file     Shary Decamp, Hershal Coria 09/01/16 1131    Milton Ferguson, MD 09/02/16 1019

## 2016-09-01 NOTE — ED Triage Notes (Signed)
C/o heart skipping times 2 weeks.  C/o dizziness since yesterday.  Left scapula pain with movement

## 2016-09-01 NOTE — ED Notes (Signed)
ED Provider at bedside. 

## 2016-09-01 NOTE — Discharge Instructions (Signed)
Please read and follow all provided instructions.  Your diagnoses today include:  1. PVC (premature ventricular contraction)   2. Dizziness     Tests performed today include: Vital signs. See below for your results today.   Medications prescribed:  Take as prescribed   Home care instructions:  Follow any educational materials contained in this packet.  Follow-up instructions: Please follow-up with your primary care provider for further evaluation of symptoms and treatment   Return instructions:  Please return to the Emergency Department if you do not get better, if you get worse, or new symptoms OR  - Fever (temperature greater than 101.39F)  - Bleeding that does not stop with holding pressure to the area    -Severe pain (please note that you may be more sore the day after your accident)  - Chest Pain  - Difficulty breathing  - Severe nausea or vomiting  - Inability to tolerate food and liquids  - Passing out  - Skin becoming red around your wounds  - Change in mental status (confusion or lethargy)  - New numbness or weakness    Please return if you have any other emergent concerns.  Additional Information:  Your vital signs today were: Ht 5\' 6"  (1.676 m)    Wt 96.2 kg (212 lb)    LMP 03/16/2012    BMI 34.22 kg/m  If your blood pressure (BP) was elevated above 135/85 this visit, please have this repeated by your doctor within one month. ---------------

## 2017-01-14 ENCOUNTER — Other Ambulatory Visit: Payer: Self-pay

## 2017-01-14 ENCOUNTER — Encounter (HOSPITAL_COMMUNITY): Payer: Self-pay | Admitting: Emergency Medicine

## 2017-01-14 ENCOUNTER — Emergency Department (HOSPITAL_COMMUNITY)
Admission: EM | Admit: 2017-01-14 | Discharge: 2017-01-14 | Disposition: A | Discharge status: Home / Self Care | Payer: Self-pay | Attending: Emergency Medicine | Admitting: Emergency Medicine

## 2017-01-14 ENCOUNTER — Emergency Department (HOSPITAL_COMMUNITY): Payer: Self-pay

## 2017-01-14 DIAGNOSIS — Z79899 Other long term (current) drug therapy: Secondary | ICD-10-CM | POA: Insufficient documentation

## 2017-01-14 DIAGNOSIS — E119 Type 2 diabetes mellitus without complications: Secondary | ICD-10-CM | POA: Insufficient documentation

## 2017-01-14 DIAGNOSIS — R0989 Other specified symptoms and signs involving the circulatory and respiratory systems: Secondary | ICD-10-CM | POA: Insufficient documentation

## 2017-01-14 DIAGNOSIS — M1712 Unilateral primary osteoarthritis, left knee: Secondary | ICD-10-CM | POA: Insufficient documentation

## 2017-01-14 DIAGNOSIS — Z7984 Long term (current) use of oral hypoglycemic drugs: Secondary | ICD-10-CM | POA: Insufficient documentation

## 2017-01-14 DIAGNOSIS — I10 Essential (primary) hypertension: Secondary | ICD-10-CM | POA: Insufficient documentation

## 2017-01-14 DIAGNOSIS — Z7982 Long term (current) use of aspirin: Secondary | ICD-10-CM | POA: Insufficient documentation

## 2017-01-14 MED ORDER — ALBUTEROL SULFATE HFA 108 (90 BASE) MCG/ACT IN AERS: 2.0000 | INHALATION_SPRAY | RESPIRATORY_TRACT | Status: DC

## 2017-01-14 MED ORDER — TRAMADOL HCL 50 MG PO TABS: 50.0000 mg | ORAL_TABLET | Freq: Four times a day (QID) | ORAL | 0 refills | Status: DC | PRN

## 2017-01-14 MED ORDER — ALBUTEROL SULFATE HFA 108 (90 BASE) MCG/ACT IN AERS
2.0000 | INHALATION_SPRAY | Freq: Once | RESPIRATORY_TRACT | Status: AC
  Administered 2017-01-14: 2 via RESPIRATORY_TRACT
  Filled 2017-01-14: qty 6.7

## 2017-01-14 NOTE — ED Provider Notes (Signed)
[facility] EMERGENCY DEPARTMENT Provider Note   CSN: [id number] Arrival date & time: 01/14/17  7062     History   Chief Complaint Chief Complaint  Patient presents with  . Knee Pain    HPI Brooke [name] is a 61 y.o. female.   Knee Pain   This is a new problem. The current episode started more than 1 week ago. The problem occurs daily. The problem has been gradually worsening. The pain is present in the left knee. The quality of the pain is described as aching. The pain is moderate. Associated symptoms include stiffness. The symptoms are aggravated by cold and standing. She has tried OTC pain medications for the symptoms. The treatment provided no relief.    Past Medical History:  Diagnosis Date  . Diabetes mellitus   . Hypertension   . Obesity (BMI 35.0-39.9 without comorbidity)   . Vertigo     There are no active problems to display for this patient.   Past Surgical History:  Procedure Laterality Date  . BREAST SURGERY      OB History    Gravida Para Term Preterm AB Living             1   SAB TAB Ectopic Multiple Live Births                   Home Medications    Prior to Admission medications   Medication Sig Start Date End Date Taking? Authorizing Provider  amLODipine (NORVASC) 10 MG tablet Take 10 mg by mouth daily.   Yes [provider]  aspirin 81 MG EC tablet Take 81 mg daily by mouth.    Yes [provider]  atenolol (TENORMIN) 100 MG tablet Take 100 mg by mouth every morning.    Yes [provider]  Dulaglutide (TRULICITY [place]) Inject 1 application once a week into the skin. Fridays   Yes [provider]  glipiZIDE (GLUCOTROL) 10 MG tablet Take 10 mg by mouth 2 (two) times daily before a meal.   Yes [provider]  ibuprofen (ADVIL,MOTRIN) 200 MG tablet Take 400 mg daily as needed by mouth for headache or mild pain.   Yes [provider]  lisinopril-hydrochlorothiazide  (PRINZIDE,ZESTORETIC) 20-25 MG per tablet Take 1 tablet by mouth daily. 11/06/10  Yes Charlena Cross, MD  Omega-3 Fatty Acids (FISH OIL) 1000 MG CAPS Take 1,000 mg by mouth daily.    Yes [provider]  polyvinyl alcohol-povidone (REFRESH) 1.4-0.6 % ophthalmic solution Place 1 drop into both eyes as needed (dry eyes/irritation).    Yes [provider]  sitaGLIPtin-metformin (JANUMET) 50-1000 MG per tablet Take 1 tablet by mouth 2 (two) times daily.   Yes [provider]    Family History History reviewed. No pertinent family history.  Social History Social History   Tobacco Use  . Smoking status: Never Smoker  . Smokeless tobacco: Never Used  Substance Use Topics  . Alcohol use: No  . Drug use: No     Allergies   Patient has no known allergies.   Review of Systems Review of Systems  Constitutional: Negative for activity change.       All ROS Neg except as noted in HPI  HENT: Negative for nosebleeds.   Eyes: Negative for photophobia and discharge.  Respiratory: Negative for cough, shortness of breath and wheezing.   Cardiovascular: Negative for chest pain and palpitations.  Gastrointestinal: Negative for abdominal pain and blood in stool.  Genitourinary: Negative for dysuria, frequency and hematuria.  Musculoskeletal: Positive for arthralgias and stiffness. Negative for back pain and neck pain.  Skin: Negative.   Neurological: Negative for dizziness, seizures and speech difficulty.  Psychiatric/Behavioral: Negative for confusion and hallucinations.     Physical Exam Updated Vital Signs BP 140/73   Pulse 71   Temp 98.1 F (36.7 C) (Oral)   Resp 18   Ht 5\' 5"  (1.651 m)   Wt 96.2 kg (212 lb)   LMP 03/16/2012   SpO2 99%   BMI 35.28 kg/m   Physical Exam  Constitutional: She is oriented to person, place, and time. She appears well-developed and well-nourished.  Non-toxic appearance.  HENT:  Head: Normocephalic.  Right Ear: Tympanic  membrane and external ear normal.  Left Ear: Tympanic membrane and external ear normal.  Eyes: EOM and lids are normal. Pupils are equal, round, and reactive to light.  Neck: Normal range of motion. Neck supple. Carotid bruit is not present.  Cardiovascular: Normal rate, regular rhythm, normal heart sounds, intact distal pulses and normal pulses.  Pulmonary/Chest: No respiratory distress. She has rhonchi.  Abdominal: Soft. Bowel sounds are normal. There is no tenderness. There is no guarding.  Musculoskeletal: Normal range of motion.  Good ROM of the left hip. Pain and crepitus with ROM of the left knee. No effusion. NO hot joint. FROM of the ankle. DP 2+  Lymphadenopathy:       Head (right side): No submandibular adenopathy present.       Head (left side): No submandibular adenopathy present.    She has no cervical adenopathy.  Neurological: She is alert and oriented to person, place, and time. She has normal strength. No cranial nerve deficit or sensory deficit.  Skin: Skin is warm and dry.  Psychiatric: She has a normal mood and affect. Her speech is normal.  Nursing note and vitals reviewed.    ED Treatments / Results  Labs (all labs ordered are listed, but only abnormal results are displayed) Labs Reviewed - No data to display  EKG  EKG Interpretation None       Radiology Dg Knee Complete 4 Views Left  Result Date: 01/14/2017 CLINICAL DATA:  Left lateral knee pain for 10 days. EXAM: LEFT KNEE - COMPLETE 4+ VIEW COMPARISON:  None. FINDINGS: No evidence of fracture, dislocation, or joint effusion. Small osteophytes are noted off of the tibial spines. No significant joint space narrowing. Soft tissues are unremarkable. IMPRESSION: Small osteophytes off of the tibial spines, otherwise normal radiograph of the left knee. Electronically Signed   By: Fidela Salisbury M.D.   On: 01/14/2017 09:16    Procedures Procedures (including critical care time)  Medications Ordered in  ED Medications - No data to display   Initial Impression / Assessment and Plan / ED Course  I have reviewed the triage vital signs and the nursing notes.  Pertinent labs & imaging results that were available during my care of the patient were reviewed by me and considered in my medical decision making (see chart for details).       Final Clinical Impressions(s) / ED Diagnoses MDM Vital signs within normal limits.  Pulse oximetry is 99% on room air.  Within normal limits by my interpretation.  X-ray of the left knee shows osteophytes of the tibial spines.  No effusion present.  No fracture or dislocation noted.  There is no evidence of any hot joints, and there is no history of trauma.  The patient will  be fitted with a knee sleeve.  We will asked patient to use Tylenol every 4 hours for mild pain.  Prescription for Norco will be given for the patient to use for more severe pain.  Patient is referred to orthopedics for additional evaluation and management of the ongoing knee pain.  Patient has a few scattered rhonchi, and states she has been having some chest congestion.  She states she has had success with use of albuterol in the past and request an albuterol inhaler.  Inhaler ordered.  Patient asked to follow-up with [facility] MD for recheck.   Final diagnoses:  None    ED Discharge Orders    None       Lily Kocher, PA-C 01/14/17 1111    Nat Christen, MD 01/15/17 2100

## 2017-01-14 NOTE — ED Triage Notes (Signed)
PT c/o left knee pain with no injury x10 days.

## 2017-01-14 NOTE — Discharge Instructions (Addendum)
Please use a heating pad. Use tylenol every 4 hours for mild pain. Use ultram for more severe pain.This medication may cause drowsiness. Please do not drink, drive, or participate in activity that requires concentration while taking this medication. Please see Dr Aline Brochure for additonal evaluation if not improving.

## 2017-01-22 ENCOUNTER — Emergency Department (HOSPITAL_COMMUNITY)
Admission: EM | Admit: 2017-01-22 | Discharge: 2017-01-22 | Disposition: A | Discharge status: Home / Self Care | Payer: Self-pay | Attending: Emergency Medicine | Admitting: Emergency Medicine

## 2017-01-22 ENCOUNTER — Other Ambulatory Visit: Payer: Self-pay

## 2017-01-22 ENCOUNTER — Encounter (HOSPITAL_COMMUNITY): Payer: Self-pay | Admitting: Emergency Medicine

## 2017-01-22 DIAGNOSIS — E11649 Type 2 diabetes mellitus with hypoglycemia without coma: Secondary | ICD-10-CM | POA: Insufficient documentation

## 2017-01-22 DIAGNOSIS — Z7982 Long term (current) use of aspirin: Secondary | ICD-10-CM | POA: Insufficient documentation

## 2017-01-22 DIAGNOSIS — I10 Essential (primary) hypertension: Secondary | ICD-10-CM | POA: Insufficient documentation

## 2017-01-22 DIAGNOSIS — Z79899 Other long term (current) drug therapy: Secondary | ICD-10-CM | POA: Insufficient documentation

## 2017-01-22 DIAGNOSIS — E162 Hypoglycemia, unspecified: Secondary | ICD-10-CM

## 2017-01-22 DIAGNOSIS — Z7984 Long term (current) use of oral hypoglycemic drugs: Secondary | ICD-10-CM | POA: Insufficient documentation

## 2017-01-22 LAB — CBC WITH DIFFERENTIAL/PLATELET
BASOS ABS: 0.1 10*3/uL (ref 0.0–0.1)
BASOS PCT: 1 %
EOS ABS: 0.6 10*3/uL (ref 0.0–0.7)
Eosinophils Relative: 7 %
HEMATOCRIT: 41.9 % (ref 36.0–46.0)
HEMOGLOBIN: 12.9 g/dL (ref 12.0–15.0)
Lymphocytes Relative: 34 %
Lymphs Abs: 2.8 10*3/uL (ref 0.7–4.0)
MCH: 26.3 pg (ref 26.0–34.0)
MCHC: 30.8 g/dL (ref 30.0–36.0)
MCV: 85.5 fL (ref 78.0–100.0)
Monocytes Absolute: 0.5 10*3/uL (ref 0.1–1.0)
Monocytes Relative: 6 %
NEUTROS ABS: 4.3 10*3/uL (ref 1.7–7.7)
NEUTROS PCT: 52 %
Platelets: 436 10*3/uL — ABNORMAL HIGH (ref 150–400)
RBC: 4.9 MIL/uL (ref 3.87–5.11)
RDW: 14.1 % (ref 11.5–15.5)
WBC: 8.1 10*3/uL (ref 4.0–10.5)

## 2017-01-22 LAB — BASIC METABOLIC PANEL
ANION GAP: 9 (ref 5–15)
BUN: 12 mg/dL (ref 6–20)
CALCIUM: 9.3 mg/dL (ref 8.9–10.3)
CO2: 27 mmol/L (ref 22–32)
CREATININE: 1.11 mg/dL — AB (ref 0.44–1.00)
Chloride: 105 mmol/L (ref 101–111)
GFR calc non Af Amer: 52 mL/min — ABNORMAL LOW (ref >60)
Glucose, Bld: 121 mg/dL — ABNORMAL HIGH (ref 65–99)
Potassium: 3.5 mmol/L (ref 3.5–5.1)
SODIUM: 141 mmol/L (ref 135–145)

## 2017-01-22 LAB — CBG MONITORING, ED
Glucose-Capillary: 102 mg/dL — ABNORMAL HIGH (ref 65–99)
Glucose-Capillary: 169 mg/dL — ABNORMAL HIGH (ref 65–99)

## 2017-01-22 LAB — URINALYSIS, ROUTINE W REFLEX MICROSCOPIC
BILIRUBIN URINE: NEGATIVE
Glucose, UA: NEGATIVE mg/dL
Hgb urine dipstick: NEGATIVE
Ketones, ur: NEGATIVE mg/dL
LEUKOCYTES UA: NEGATIVE
NITRITE: NEGATIVE
PH: 5 (ref 5.0–8.0)
Protein, ur: NEGATIVE mg/dL
SPECIFIC GRAVITY, URINE: 1.015 (ref 1.005–1.030)

## 2017-01-22 NOTE — ED Provider Notes (Signed)
[facility] EMERGENCY DEPARTMENT Provider Note   CSN: [id number] Arrival date & time: 01/22/17  1545     History   Chief Complaint Chief Complaint  Patient presents with  . Hypoglycemia    HPI Brooke [name] is a 61 y.o. female.  HPI Patient presents with low blood sugar.  States sometimes since last night her glucose will go as low as 79.  States she was feeling a little shaky with it.  States her sugar normally stays above 130.  She did have a new medicine added but that was around 5 months ago.  States she has been eating less and did not eat much this morning.  States she thinks she may be eating her breakfast to late. Past Medical History:  Diagnosis Date  . Diabetes mellitus   . Hypertension   . Obesity (BMI 35.0-39.9 without comorbidity)   . Vertigo     There are no active problems to display for this patient.   Past Surgical History:  Procedure Laterality Date  . BREAST SURGERY      OB History    Gravida Para Term Preterm AB Living             1   SAB TAB Ectopic Multiple Live Births                   Home Medications    Prior to Admission medications   Medication Sig Start Date End Date Taking? Authorizing Provider  albuterol (PROVENTIL HFA;VENTOLIN HFA) 108 (90 Base) MCG/ACT inhaler Inhale 1-2 puffs every 6 (six) hours as needed into the lungs for wheezing or shortness of breath.   Yes [provider]  amLODipine (NORVASC) 10 MG tablet Take 10 mg by mouth daily.   Yes [provider]  aspirin 81 MG EC tablet Take 81 mg every other day by mouth.    Yes [provider]  atenolol (TENORMIN) 100 MG tablet Take 100 mg by mouth every morning.    Yes [provider]  ferrous sulfate 325 (65 FE) MG tablet Take 325 mg daily with breakfast by mouth.   Yes [provider]  glipiZIDE (GLUCOTROL) 10 MG tablet Take 10 mg by mouth 2 (two) times daily before a meal.   Yes [provider]  ibuprofen  (ADVIL,MOTRIN) 200 MG tablet Take 400 mg daily as needed by mouth for headache or mild pain.   Yes [provider]  lisinopril-hydrochlorothiazide (PRINZIDE,ZESTORETIC) 20-25 MG per tablet Take 1 tablet by mouth daily. 11/06/10  Yes Charlena Cross, MD  Multiple Vitamin (MULTIVITAMIN WITH MINERALS) TABS tablet Take 1 tablet daily by mouth.   Yes [provider]  Omega-3 Fatty Acids (FISH OIL) 1000 MG CAPS Take 1,000 mg by mouth daily.    Yes [provider]  polyvinyl alcohol-povidone (REFRESH) 1.4-0.6 % ophthalmic solution Place 1 drop into both eyes as needed (dry eyes/irritation).    Yes [provider]  sitaGLIPtin-metformin (JANUMET) 50-1000 MG per tablet Take 1 tablet by mouth 2 (two) times daily.   Yes [provider]  Dulaglutide (TRULICITY [place]) Inject 1 application once a week into the skin. Fridays    [provider]  traMADol (ULTRAM) 50 MG tablet Take 1 tablet (50 mg total) every 6 (six) hours as needed by mouth. Patient not taking: Reported on 01/22/2017 01/14/17   Lily Kocher, PA-C    Family History No family history on file.  Social History Social History   Tobacco  Use  . Smoking status: Never Smoker  . Smokeless tobacco: Never Used  Substance Use Topics  . Alcohol use: No  . Drug use: No     Allergies   Patient has no known allergies.   Review of Systems Review of Systems  Constitutional: Negative for activity change and appetite change.  Eyes: Negative for pain.  Respiratory: Positive for cough. Negative for chest tightness and shortness of breath.   Cardiovascular: Negative for chest pain and leg swelling.  Gastrointestinal: Negative for abdominal pain, diarrhea, nausea and vomiting.  Genitourinary: Positive for flank pain.  Musculoskeletal: Negative for back pain and neck stiffness.  Skin: Negative for rash.  Neurological: Positive for light-headedness. Negative for weakness, numbness and headaches.    Psychiatric/Behavioral: Negative for behavioral problems.     Physical Exam Updated Vital Signs BP (!) 158/83 (BP Location: Right Arm)   Pulse 76   Temp 97.9 F (36.6 C) (Oral)   Resp 12   Ht 5\' 5"  (1.651 m)   Wt 96.2 kg (212 lb)   LMP 03/16/2012   SpO2 99%   BMI 35.28 kg/m   Physical Exam  Constitutional: She appears well-developed and well-nourished.  HENT:  Head: Normocephalic.  Eyes: Pupils are equal, round, and reactive to light.  Neck: Neck supple.  Pulmonary/Chest: She has no wheezes.  Rare scattered wheeze.  Abdominal: There is no tenderness.  Musculoskeletal: She exhibits no edema.  No CVA tenderness  Neurological: She is alert.  Skin: Skin is warm. Capillary refill takes less than 2 seconds.  Psychiatric: She has a normal mood and affect.     ED Treatments / Results  Labs (all labs ordered are listed, but only abnormal results are displayed) Labs Reviewed  BASIC METABOLIC PANEL - Abnormal; Notable for the following components:      Result Value   Glucose, Bld 121 (*)    Creatinine, Ser 1.11 (*)    GFR calc non Af Amer 52 (*)    All other components within normal limits  CBC WITH DIFFERENTIAL/PLATELET - Abnormal; Notable for the following components:   Platelets 436 (*)    All other components within normal limits  CBG MONITORING, ED - Abnormal; Notable for the following components:   Glucose-Capillary 102 (*)    All other components within normal limits  CBG MONITORING, ED - Abnormal; Notable for the following components:   Glucose-Capillary 169 (*)    All other components within normal limits  URINALYSIS, ROUTINE W REFLEX MICROSCOPIC    EKG  EKG Interpretation None       Radiology No results found.  Procedures Procedures (including critical care time)  Medications Ordered in ED Medications - No data to display   Initial Impression / Assessment and Plan / ED Course  I have reviewed the triage vital signs and the nursing  notes.  Pertinent labs & imaging results that were available during my care of the patient were reviewed by me and considered in my medical decision making (see chart for details).     Patient with episodes of hypoglycemia.  Will only go down to 79 but appears to be somewhat symptomatic with it.  Renal function is stable.  Labs and urine reassuring.  Sugar stable here.  States she has been eating later and thinks that may be the problem.  No recent change in medications.  Will discharge home to follow-up with her primary care doctor.  Final Clinical Impressions(s) / ED Diagnoses   Final diagnoses:  Hypoglycemia  ED Discharge Orders    None       Davonna Belling, MD 01/22/17 [id number]

## 2017-01-22 NOTE — ED Triage Notes (Signed)
Patient states that since last night her sugar has intermittently "dropped below 130" which is not normal for her. Patient states that blood sugar has been as low as 79 today. Per patient feels slightly fatigue.

## 2017-05-27 ENCOUNTER — Encounter (HOSPITAL_COMMUNITY): Payer: Self-pay | Admitting: Cardiology

## 2017-05-27 ENCOUNTER — Emergency Department (HOSPITAL_COMMUNITY): Payer: Self-pay

## 2017-05-27 ENCOUNTER — Emergency Department (HOSPITAL_COMMUNITY)
Admission: EM | Admit: 2017-05-27 | Discharge: 2017-05-27 | Disposition: A | Discharge status: Home / Self Care | Payer: Self-pay | Attending: Emergency Medicine | Admitting: Emergency Medicine

## 2017-05-27 DIAGNOSIS — Z79899 Other long term (current) drug therapy: Secondary | ICD-10-CM | POA: Insufficient documentation

## 2017-05-27 DIAGNOSIS — Z7984 Long term (current) use of oral hypoglycemic drugs: Secondary | ICD-10-CM | POA: Insufficient documentation

## 2017-05-27 DIAGNOSIS — Z7982 Long term (current) use of aspirin: Secondary | ICD-10-CM | POA: Insufficient documentation

## 2017-05-27 DIAGNOSIS — E119 Type 2 diabetes mellitus without complications: Secondary | ICD-10-CM | POA: Insufficient documentation

## 2017-05-27 DIAGNOSIS — I10 Essential (primary) hypertension: Secondary | ICD-10-CM | POA: Insufficient documentation

## 2017-05-27 DIAGNOSIS — B9789 Other viral agents as the cause of diseases classified elsewhere: Secondary | ICD-10-CM | POA: Insufficient documentation

## 2017-05-27 DIAGNOSIS — J069 Acute upper respiratory infection, unspecified: Secondary | ICD-10-CM | POA: Insufficient documentation

## 2017-05-27 MED ORDER — FLUTICASONE PROPIONATE 50 MCG/ACT NA SUSP: 2.0000 | Freq: Every day | NASAL | 2 refills | Status: DC

## 2017-05-27 MED ORDER — BENZONATATE 100 MG PO CAPS: 100.0000 mg | ORAL_CAPSULE | Freq: Three times a day (TID) | ORAL | 0 refills | Status: DC

## 2017-05-27 MED ORDER — ALBUTEROL SULFATE HFA 108 (90 BASE) MCG/ACT IN AERS
2.0000 | INHALATION_SPRAY | Freq: Once | RESPIRATORY_TRACT | Status: AC
  Administered 2017-05-27: 2 via RESPIRATORY_TRACT
  Filled 2017-05-27: qty 6.7

## 2017-05-27 NOTE — Discharge Instructions (Signed)
Please take the following medications  Zyrtec 10mg  at night before bed Albuterol 2 puffs every 4 hours as needed Tessalon every 8 hours as needed for coughing Flonase nasal spray every morning.  See your doctor in 1 week for a recheck ER for worsening symptoms.  Your xray looked good - no signs of pneumonia

## 2017-05-27 NOTE — ED Triage Notes (Signed)
C/o  Wheezing and coughing for 2-3 weeks.

## 2017-05-27 NOTE — ED Provider Notes (Signed)
[facility] EMERGENCY DEPARTMENT Provider Note   CSN: [id number] Arrival date & time: 05/27/17  5701     History   Chief Complaint Chief Complaint  Patient presents with  . Cough    HPI Brooke [name] is a 62 y.o. female.  HPI  The patient is a 62 year old female with a known history of high blood pressure and diabetes who complains with a chief complaint of coughing and wheezing.  She reports that she has had approximately 1 month of ongoing symptoms including persistent cough, rattling in her chest, some wheezing and now has developed some nasal congestion.  She feels this daily, it does not seem to be getting any better, it is not associated with fevers, she did see the health department and they listen to her lungs, told her that she had clear lungs and recommended some over-the-counter Coricidin which she states has minimal improvement.  She has not noticed an escalation in her blood sugars.  Past Medical History:  Diagnosis Date  . Diabetes mellitus   . Hypertension   . Obesity (BMI 35.0-39.9 without comorbidity)   . Vertigo     There are no active problems to display for this patient.   Past Surgical History:  Procedure Laterality Date  . BREAST SURGERY       OB History    Gravida      Para      Term      Preterm      AB      Living  1     SAB      TAB      Ectopic      Multiple      Live Births               Home Medications    Prior to Admission medications   Medication Sig Start Date End Date Taking? Authorizing Provider  albuterol (PROVENTIL HFA;VENTOLIN HFA) 108 (90 Base) MCG/ACT inhaler Inhale 1-2 puffs every 6 (six) hours as needed into the lungs for wheezing or shortness of breath.    [provider]  amLODipine (NORVASC) 10 MG tablet Take 10 mg by mouth daily.    [provider]  aspirin 81 MG EC tablet Take 81 mg every other day by mouth.     [provider]  atenolol (TENORMIN) 100 MG  tablet Take 100 mg by mouth every morning.     [provider]  benzonatate (TESSALON) 100 MG capsule Take 1 capsule (100 mg total) by mouth every 8 (eight) hours. 05/27/17   Noemi Chapel, MD  Dulaglutide (TRULICITY [place]) Inject 1 application once a week into the skin. Fridays    [provider]  ferrous sulfate 325 (65 FE) MG tablet Take 325 mg daily with breakfast by mouth.    [provider]  fluticasone (FLONASE) 50 MCG/ACT nasal spray Place 2 sprays into both nostrils daily. 05/27/17   Noemi Chapel, MD  glipiZIDE (GLUCOTROL) 10 MG tablet Take 10 mg by mouth 2 (two) times daily before a meal.    [provider]  ibuprofen (ADVIL,MOTRIN) 200 MG tablet Take 400 mg daily as needed by mouth for headache or mild pain.    [provider]  lisinopril-hydrochlorothiazide (PRINZIDE,ZESTORETIC) 20-25 MG per tablet Take 1 tablet by mouth daily. 11/06/10   Charlena Cross, MD  Multiple Vitamin (MULTIVITAMIN WITH MINERALS) TABS tablet Take 1 tablet daily by mouth.    [provider]  Omega-3 Fatty  Acids (FISH OIL) 1000 MG CAPS Take 1,000 mg by mouth daily.     [provider]  polyvinyl alcohol-povidone (REFRESH) 1.4-0.6 % ophthalmic solution Place 1 drop into both eyes as needed (dry eyes/irritation).     [provider]  sitaGLIPtin-metformin (JANUMET) 50-1000 MG per tablet Take 1 tablet by mouth 2 (two) times daily.    [provider]  traMADol (ULTRAM) 50 MG tablet Take 1 tablet (50 mg total) every 6 (six) hours as needed by mouth. Patient not taking: Reported on 01/22/2017 01/14/17   Lily Kocher, PA-C    Family History History reviewed. No pertinent family history.  Social History Social History   Tobacco Use  . Smoking status: Never Smoker  . Smokeless tobacco: Never Used  Substance Use Topics  . Alcohol use: No  . Drug use: No     Allergies   Patient has no known allergies.   Review of Systems Review  of Systems  All other systems reviewed and are negative.    Physical Exam Updated Vital Signs BP (!) 160/91 (BP Location: Right Arm)   Pulse 77   Temp 97.9 F (36.6 C) (Oral)   Resp 16   Ht 5\' 6"  (1.676 m)   Wt 96.6 kg (213 lb)   LMP 03/16/2012   SpO2 98%   BMI 34.38 kg/m   Physical Exam  Constitutional: She appears well-developed and well-nourished. No distress.  HENT:  Head: Normocephalic and atraumatic.  Mouth/Throat: Oropharynx is clear and moist. No oropharyngeal exudate.  The oropharynx is clear and moist, there is no erythema exudate or asymmetry, phonation is normal, nasal passages are clear with mildly swollen turbinates but no discharge or drainage, no trismus or torticollis and no lymphadenopathy of the neck  Eyes: Pupils are equal, round, and reactive to light. Conjunctivae and EOM are normal. Right eye exhibits no discharge. Left eye exhibits no discharge. No scleral icterus.  Neck: Normal range of motion. Neck supple. No JVD present. No thyromegaly present.  Cardiovascular: Normal rate, regular rhythm, normal heart sounds and intact distal pulses. Exam reveals no gallop and no friction rub.  No murmur heard. Pulmonary/Chest: Effort normal and breath sounds normal. No respiratory distress. She has no wheezes. She has no rales.  Wheezing only present on forced expiration, otherwise the patient has normal lung sounds without distress or increased work of breathing and is able to speak in full sentences  Abdominal: Soft. Bowel sounds are normal. She exhibits no distension and no mass. There is no tenderness.  Musculoskeletal: Normal range of motion. She exhibits no edema or tenderness.  No edema of the lower extremities  Lymphadenopathy:    She has no cervical adenopathy.  Neurological: She is alert. Coordination normal.  Skin: Skin is warm and dry. No rash noted. No erythema.  Psychiatric: She has a normal mood and affect. Her behavior is normal.  Nursing note and  vitals reviewed.    ED Treatments / Results  Labs (all labs ordered are listed, but only abnormal results are displayed) Labs Reviewed - No data to display  EKG None  Radiology Dg Chest 2 View  Result Date: 05/27/2017 CLINICAL DATA:  Cough and wheezing for the past month. EXAM: CHEST - 2 VIEW COMPARISON:  10/06/2015. FINDINGS: Borderline enlarged cardiac silhouette. Clear lungs. Mild diffuse peribronchial thickening. Thoracic spine degenerative changes. IMPRESSION: Mild bronchitic changes. Electronically Signed   By: Claudie Revering M.D.   On: 05/27/2017 10:02    Procedures Procedures (including critical care  time)  Medications Ordered in ED Medications  albuterol (PROVENTIL HFA;VENTOLIN HFA) 108 (90 Base) MCG/ACT inhaler 2 puff (has no administration in time range)     Initial Impression / Assessment and Plan / ED Course  I have reviewed the triage vital signs and the nursing notes.  Pertinent labs & imaging results that were available during my care of the patient were reviewed by me and considered in my medical decision making (see chart for details).  Clinical Course as of May 28 1022  Sat May 27, 2017  1010 Reviewed chest x-ray report, images viewed as well, no signs of infiltrate or pneumothorax, mild bronchitis changes, patient informed, stable for discharge with medications as below   [BM]    Clinical Course User Index [BM] Noemi Chapel, MD   The persistent coughing wheezing with some nasal congestion suggest that there could be some allergy component to her symptoms, would also consider a viral syndrome, she does not appear ill, will perform an x-ray to make sure there is no pneumonia or other abnormal findings.  The patient has never had reactive airway disease and is not a smoker.  Final Clinical Impressions(s) / ED Diagnoses   Final diagnoses:  Viral URI with cough    ED Discharge Orders        Ordered    fluticasone (FLONASE) 50 MCG/ACT nasal spray  Daily      05/27/17 1022    benzonatate (TESSALON) 100 MG capsule  Every 8 hours     05/27/17 1022       Noemi Chapel, MD 05/27/17 1024

## 2017-06-14 ENCOUNTER — Emergency Department (HOSPITAL_COMMUNITY): Payer: Self-pay

## 2017-06-14 ENCOUNTER — Encounter (HOSPITAL_COMMUNITY): Payer: Self-pay | Admitting: Emergency Medicine

## 2017-06-14 ENCOUNTER — Emergency Department (HOSPITAL_COMMUNITY)
Admission: EM | Admit: 2017-06-14 | Discharge: 2017-06-14 | Disposition: A | Discharge status: Home / Self Care | Payer: Self-pay | Attending: Emergency Medicine | Admitting: Emergency Medicine

## 2017-06-14 ENCOUNTER — Other Ambulatory Visit: Payer: Self-pay

## 2017-06-14 DIAGNOSIS — I10 Essential (primary) hypertension: Secondary | ICD-10-CM | POA: Insufficient documentation

## 2017-06-14 DIAGNOSIS — E119 Type 2 diabetes mellitus without complications: Secondary | ICD-10-CM | POA: Insufficient documentation

## 2017-06-14 DIAGNOSIS — J40 Bronchitis, not specified as acute or chronic: Secondary | ICD-10-CM | POA: Insufficient documentation

## 2017-06-14 LAB — CBC WITH DIFFERENTIAL/PLATELET
BASOS PCT: 0 %
Basophils Absolute: 0 10*3/uL (ref 0.0–0.1)
EOS ABS: 0.9 10*3/uL — AB (ref 0.0–0.7)
Eosinophils Relative: 10 %
HCT: 37.3 % (ref 36.0–46.0)
Hemoglobin: 11.8 g/dL — ABNORMAL LOW (ref 12.0–15.0)
Lymphocytes Relative: 40 %
Lymphs Abs: 3.6 10*3/uL (ref 0.7–4.0)
MCH: 26.3 pg (ref 26.0–34.0)
MCHC: 31.6 g/dL (ref 30.0–36.0)
MCV: 83.3 fL (ref 78.0–100.0)
MONO ABS: 0.5 10*3/uL (ref 0.1–1.0)
MONOS PCT: 6 %
NEUTROS ABS: 4 10*3/uL (ref 1.7–7.7)
Neutrophils Relative %: 44 %
PLATELETS: 378 10*3/uL (ref 150–400)
RBC: 4.48 MIL/uL (ref 3.87–5.11)
RDW: 14 % (ref 11.5–15.5)
WBC: 9 10*3/uL (ref 4.0–10.5)

## 2017-06-14 LAB — BASIC METABOLIC PANEL
ANION GAP: 10 (ref 5–15)
BUN: 15 mg/dL (ref 6–20)
CALCIUM: 9.1 mg/dL (ref 8.9–10.3)
CO2: 25 mmol/L (ref 22–32)
Chloride: 103 mmol/L (ref 101–111)
Creatinine, Ser: 1.25 mg/dL — ABNORMAL HIGH (ref 0.44–1.00)
GFR, EST AFRICAN AMERICAN: 52 mL/min — AB (ref >60)
GFR, EST NON AFRICAN AMERICAN: 45 mL/min — AB (ref >60)
GLUCOSE: 158 mg/dL — AB (ref 65–99)
Potassium: 4.2 mmol/L (ref 3.5–5.1)
Sodium: 138 mmol/L (ref 135–145)

## 2017-06-14 LAB — TROPONIN I

## 2017-06-14 MED ORDER — ALBUTEROL SULFATE HFA 108 (90 BASE) MCG/ACT IN AERS: 1.0000 | INHALATION_SPRAY | Freq: Four times a day (QID) | RESPIRATORY_TRACT | 0 refills | Status: DC | PRN

## 2017-06-14 MED ORDER — ALBUTEROL SULFATE (2.5 MG/3ML) 0.083% IN NEBU
2.5000 mg | INHALATION_SOLUTION | Freq: Once | RESPIRATORY_TRACT | Status: AC
  Administered 2017-06-14: 2.5 mg via RESPIRATORY_TRACT
  Filled 2017-06-14: qty 3

## 2017-06-14 MED ORDER — PREDNISONE 20 MG PO TABS: 20.0000 mg | ORAL_TABLET | Freq: Every day | ORAL | 0 refills | Status: AC

## 2017-06-14 MED ORDER — IPRATROPIUM-ALBUTEROL 0.5-2.5 (3) MG/3ML IN SOLN
3.0000 mL | Freq: Once | RESPIRATORY_TRACT | Status: AC
  Administered 2017-06-14: 3 mL via RESPIRATORY_TRACT
  Filled 2017-06-14: qty 3

## 2017-06-14 MED ORDER — AZITHROMYCIN 250 MG PO TABS: 250.0000 mg | ORAL_TABLET | Freq: Every day | ORAL | 0 refills | Status: DC

## 2017-06-14 NOTE — ED Triage Notes (Signed)
PT c/o productive yellow/green sputum cough unrelieved by Flonase and Tessalon capsules prescribed at last ED visit. Wheezing worsening over the past few weeks unrelieved by home inhaler.
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
## 2017-12-08 ENCOUNTER — Encounter (HOSPITAL_COMMUNITY): Payer: Self-pay | Admitting: Emergency Medicine

## 2017-12-08 ENCOUNTER — Emergency Department (HOSPITAL_COMMUNITY)
Admission: EM | Admit: 2017-12-08 | Discharge: 2017-12-08 | Disposition: A | Discharge status: Home / Self Care | Payer: Self-pay | Attending: Emergency Medicine | Admitting: Emergency Medicine

## 2017-12-08 ENCOUNTER — Other Ambulatory Visit: Payer: Self-pay

## 2017-12-08 DIAGNOSIS — I10 Essential (primary) hypertension: Secondary | ICD-10-CM | POA: Insufficient documentation

## 2017-12-08 DIAGNOSIS — S29012A Strain of muscle and tendon of back wall of thorax, initial encounter: Secondary | ICD-10-CM

## 2017-12-08 DIAGNOSIS — Y999 Unspecified external cause status: Secondary | ICD-10-CM | POA: Insufficient documentation

## 2017-12-08 DIAGNOSIS — X58XXXA Exposure to other specified factors, initial encounter: Secondary | ICD-10-CM | POA: Insufficient documentation

## 2017-12-08 DIAGNOSIS — Y929 Unspecified place or not applicable: Secondary | ICD-10-CM | POA: Insufficient documentation

## 2017-12-08 DIAGNOSIS — Z7982 Long term (current) use of aspirin: Secondary | ICD-10-CM | POA: Insufficient documentation

## 2017-12-08 DIAGNOSIS — Z79899 Other long term (current) drug therapy: Secondary | ICD-10-CM | POA: Insufficient documentation

## 2017-12-08 DIAGNOSIS — E119 Type 2 diabetes mellitus without complications: Secondary | ICD-10-CM | POA: Insufficient documentation

## 2017-12-08 DIAGNOSIS — Y939 Activity, unspecified: Secondary | ICD-10-CM | POA: Insufficient documentation

## 2017-12-08 MED ORDER — CYCLOBENZAPRINE HCL 10 MG PO TABS: 10.0000 mg | ORAL_TABLET | Freq: Three times a day (TID) | ORAL | 0 refills | Status: DC

## 2017-12-08 MED ORDER — DEXAMETHASONE 4 MG PO TABS: 4.0000 mg | ORAL_TABLET | Freq: Two times a day (BID) | ORAL | 0 refills | Status: DC

## 2017-12-08 NOTE — ED Notes (Signed)
Patient given discharge instruction, verbalized understand. Patient ambulatory out of the department.  

## 2017-12-08 NOTE — ED Provider Notes (Signed)
[facility] EMERGENCY DEPARTMENT Provider Note   CSN: [id number] Arrival date & time: 12/08/17  1234     History   Chief Complaint Chief Complaint  Patient presents with  . Back Pain    HPI Brooke [name] is a 62 y.o. female.  Patient is a 62 year old female who presents to the emergency department with a complaint of upper back pain.  The patient states this problem is been going off and on for about 3 months.  The patient states she is in the emergency department because it is getting frustrating, and she wanted to have it checked.  She has not had any recent operations or procedures involving her upper back.  She has not had fever or chills.  She says that about 3 months ago she had bronchitis and was doing a lot of coughing.  She thought that the majority of her back pain was may be related to the cough.  She says now the pain in her sides is mostly gone.  But she still has pain in her upper back from time to time.  No hemoptysis.  No recent cough actually.  No tearing severe pain in the back.  No abdominal related pain to the back pain.  No history of aneurysmal findings of previous work-ups.  Patient denies IV drug use.  No fever or chills reported.  She presents now for evaluation of this upper back pain.  The history is provided by the patient.    Past Medical History:  Diagnosis Date  . Diabetes mellitus   . Hypertension   . Obesity (BMI 35.0-39.9 without comorbidity)   . Vertigo     There are no active problems to display for this patient.   Past Surgical History:  Procedure Laterality Date  . BREAST SURGERY       OB History    Gravida      Para      Term      Preterm      AB      Living  1     SAB      TAB      Ectopic      Multiple      Live Births               Home Medications    Prior to Admission medications   Medication Sig Start Date End Date Taking? Authorizing Provider  albuterol (PROVENTIL HFA;VENTOLIN HFA) 108 (90  Base) MCG/ACT inhaler Inhale 1-2 puffs every 6 (six) hours as needed into the lungs for wheezing or shortness of breath.    [provider]  albuterol (PROVENTIL HFA;VENTOLIN HFA) 108 (90 Base) MCG/ACT inhaler Inhale 1-2 puffs into the lungs every 6 (six) hours as needed for wheezing or shortness of breath. 06/14/17   Long, Wonda Olds, MD  amLODipine (NORVASC) 10 MG tablet Take 10 mg by mouth daily.    [provider]  aspirin 81 MG EC tablet Take 81 mg every other day by mouth.     [provider]  atenolol (TENORMIN) 100 MG tablet Take 100 mg by mouth every morning.     [provider]  azithromycin (ZITHROMAX) 250 MG tablet Take 1 tablet (250 mg total) by mouth daily. Take first 2 tablets together, then 1 every day until finished. 06/14/17   Long, Wonda Olds, MD  benzonatate (TESSALON) 100 MG capsule Take 1 capsule (100 mg total) by mouth every 8 (eight) hours. 05/27/17   [name],  Aaron Edelman, MD  Dulaglutide (TRULICITY [place]) Inject 1 application once a week into the skin. Fridays    [provider]  ferrous sulfate 325 (65 FE) MG tablet Take 325 mg daily with breakfast by mouth.    [provider]  fluticasone (FLONASE) 50 MCG/ACT nasal spray Place 2 sprays into both nostrils daily. 05/27/17   Noemi Chapel, MD  glipiZIDE (GLUCOTROL) 10 MG tablet Take 10 mg by mouth 2 (two) times daily before a meal.    [provider]  ibuprofen (ADVIL,MOTRIN) 200 MG tablet Take 400 mg daily as needed by mouth for headache or mild pain.    [provider]  lisinopril-hydrochlorothiazide (PRINZIDE,ZESTORETIC) 20-25 MG per tablet Take 1 tablet by mouth daily. 11/06/10   Charlena Cross, MD  Multiple Vitamin (MULTIVITAMIN WITH MINERALS) TABS tablet Take 1 tablet daily by mouth.    [provider]  Omega-3 Fatty Acids (FISH OIL) 1000 MG CAPS Take 1,000 mg by mouth daily.     [provider]  polyvinyl alcohol-povidone (REFRESH) 1.4-0.6 %  ophthalmic solution Place 1 drop into both eyes as needed (dry eyes/irritation).     [provider]  sitaGLIPtin-metformin (JANUMET) 50-1000 MG per tablet Take 1 tablet by mouth 2 (two) times daily.    [provider]  traMADol (ULTRAM) 50 MG tablet Take 1 tablet (50 mg total) every 6 (six) hours as needed by mouth. Patient not taking: Reported on 01/22/2017 01/14/17   Lily Kocher, PA-C    Family History No family history on file.  Social History Social History   Tobacco Use  . Smoking status: Never Smoker  . Smokeless tobacco: Never Used  Substance Use Topics  . Alcohol use: No  . Drug use: No     Allergies   Patient has no known allergies.   Review of Systems Review of Systems  Constitutional: Negative for activity change.       All ROS Neg except as noted in HPI  HENT: Negative for nosebleeds.   Eyes: Negative for photophobia and discharge.  Respiratory: Negative for cough, shortness of breath and wheezing.   Cardiovascular: Negative for chest pain and palpitations.  Gastrointestinal: Negative for abdominal pain and blood in stool.  Genitourinary: Negative for dysuria, frequency and hematuria.  Musculoskeletal: Positive for back pain. Negative for arthralgias and neck pain.  Skin: Negative.   Neurological: Negative for dizziness, seizures and speech difficulty.  Psychiatric/Behavioral: Negative for confusion and hallucinations.     Physical Exam Updated Vital Signs BP (!) 142/74 (BP Location: Right Arm)   Pulse 79   Temp 98.1 F (36.7 C) (Oral)   Resp 17   Ht 5\' 6"  (1.676 m)   Wt 95.7 kg   LMP 03/16/2012   SpO2 96%   BMI 34.06 kg/m   Physical Exam  Constitutional: She is oriented to person, place, and time. She appears well-developed and well-nourished.  Non-toxic appearance.  HENT:  Head: Normocephalic.  Right Ear: Tympanic membrane and external ear normal.  Left Ear: Tympanic membrane and external ear normal.  Eyes: Pupils are  equal, round, and reactive to light. EOM and lids are normal.  Neck: Normal range of motion. Neck supple. Carotid bruit is not present.  Cardiovascular: Normal rate, regular rhythm, normal heart sounds, intact distal pulses and normal pulses.  Pulmonary/Chest: Breath sounds normal. No respiratory distress.  Abdominal: Soft. Bowel sounds are normal. There is no tenderness. There is no guarding.  Musculoskeletal: Normal range of motion.  Thoracic back: She exhibits spasm.       Arms: Lymphadenopathy:       Head (right side): No submandibular adenopathy present.       Head (left side): No submandibular adenopathy present.    She has no cervical adenopathy.  Neurological: She is alert and oriented to person, place, and time. She has normal strength. No cranial nerve deficit or sensory deficit.  Skin: Skin is warm and dry.  Psychiatric: She has a normal mood and affect. Her speech is normal.  Nursing note and vitals reviewed.    ED Treatments / Results  Labs (all labs ordered are listed, but only abnormal results are displayed) Labs Reviewed - No data to display  EKG None  Radiology No results found.  Procedures Procedures (including critical care time)  Medications Ordered in ED Medications - No data to display   Initial Impression / Assessment and Plan / ED Course  I have reviewed the triage vital signs and the nursing notes.  Pertinent labs & imaging results that were available during my care of the patient were reviewed by me and considered in my medical decision making (see chart for details).       Final Clinical Impressions(s) / ED Diagnoses MDM  Vital signs are within normal limits.  Pulse oximetry is 96% on room air.  Within normal limits by my interpretation.  The patient speaks in complete sentences without problem.  The patient is ambulatory without problem.  There is no mass or pulsating mass in the abdomen, and the patient has no history of aneurysmal  findings on any of her previous visits or work-ups.  Patient denies the use of any IV drug use.  No recent operations or procedures involving the upper back.  It is of note that the patient states that she sleeps on multiple pillows, because it keeps her from getting dizzy if she tries to lay flat.  She does admit that at times when she wakes up the pillows are in a different arrangement than when she went to bed, and she states this may be a source of her pain.  There are no gross neurologic deficits appreciated of the back or upper extremities.  There is no palpable step-off, and there are no hot areas appreciated of the back.  There is noted tightness and tenseness of the paraspinal area in the upper thoracic area.  Patient will be treated for muscle strain.  Prescription for Flexeril given to the patient.  The patient is to use Tylenol extra strength every 4 hours.  Patient is to use Flexeril 3 times daily for spasm pain.  Patient is to return to the emergency department if any changes in condition, problems, or concerns.   Final diagnoses:  Upper back strain, initial encounter    ED Discharge Orders    None       Lily Kocher, PA-C 12/08/17 1420    Milton Ferguson, MD 12/12/17 1559

## 2017-12-08 NOTE — ED Triage Notes (Signed)
Pt c/o of upper back pain x 3 months. Denies any injuries.

## 2017-12-08 NOTE — Discharge Instructions (Addendum)
Please use a heating pad to your upper back.  Please use Tylenol extra strength every 4 hours as needed for soreness and pain.  Use Flexeril 3 times daily as needed for spasm pain.  This medication may cause drowsiness.  Please do not drive a vehicle, operate machinery, drink alcohol, or participate in activities requiring concentration when taking this medication.

## 2018-01-05 ENCOUNTER — Emergency Department (HOSPITAL_COMMUNITY): Payer: Self-pay

## 2018-01-05 ENCOUNTER — Encounter (HOSPITAL_COMMUNITY): Payer: Self-pay | Admitting: Emergency Medicine

## 2018-01-05 ENCOUNTER — Emergency Department (HOSPITAL_COMMUNITY)
Admission: EM | Admit: 2018-01-05 | Discharge: 2018-01-05 | Disposition: A | Discharge status: Home / Self Care | Payer: Self-pay | Attending: Emergency Medicine | Admitting: Emergency Medicine

## 2018-01-05 ENCOUNTER — Other Ambulatory Visit: Payer: Self-pay

## 2018-01-05 DIAGNOSIS — Z7982 Long term (current) use of aspirin: Secondary | ICD-10-CM | POA: Insufficient documentation

## 2018-01-05 DIAGNOSIS — Z7984 Long term (current) use of oral hypoglycemic drugs: Secondary | ICD-10-CM | POA: Insufficient documentation

## 2018-01-05 DIAGNOSIS — I10 Essential (primary) hypertension: Secondary | ICD-10-CM | POA: Insufficient documentation

## 2018-01-05 DIAGNOSIS — Z79899 Other long term (current) drug therapy: Secondary | ICD-10-CM | POA: Insufficient documentation

## 2018-01-05 DIAGNOSIS — R062 Wheezing: Secondary | ICD-10-CM | POA: Insufficient documentation

## 2018-01-05 DIAGNOSIS — E119 Type 2 diabetes mellitus without complications: Secondary | ICD-10-CM | POA: Insufficient documentation

## 2018-01-05 MED ORDER — ALBUTEROL SULFATE HFA 108 (90 BASE) MCG/ACT IN AERS
2.0000 | INHALATION_SPRAY | Freq: Once | RESPIRATORY_TRACT | Status: AC
  Administered 2018-01-05: 2 via RESPIRATORY_TRACT
  Filled 2018-01-05: qty 6.7

## 2018-01-05 MED ORDER — IPRATROPIUM-ALBUTEROL 0.5-2.5 (3) MG/3ML IN SOLN: 3.0000 mL | Freq: Once | RESPIRATORY_TRACT | Status: DC

## 2018-01-05 NOTE — Discharge Instructions (Addendum)
You may use the albuterol inhaler 1-2 puffs every 4 hours as needed for cough or wheezing.  Follow-up with the pulmonologist listed for further outpatient work-up and care.

## 2018-01-05 NOTE — ED Provider Notes (Signed)
[facility] EMERGENCY DEPARTMENT Provider Note   CSN: [id number] Arrival date & time: 01/05/18  1219     History   Chief Complaint Chief Complaint  Patient presents with  . Wheezing    HPI Brooke [name] is a 62 y.o. female.  HPI  62 year old female with a history of hypertension and diabetes presents with cough and wheezing for about 1 month.  She states that she notices rattling in her chest and wheezing when she is breathing.  This is mostly first thing in the morning as well as at night.  Sometimes she will have to use an albuterol inhaler at night.  She uses it every 4 hours throughout the night or first thing in the morning.  No symptoms during the day.  Seems to have a nonproductive cough at night.  No fevers, URI symptoms, or shortness of breath/chest pain.  She denies any leg swelling.  She states people smoke around her and she thinks this affects her but she does not smoke herself.  Similar symptoms back in April when she was diagnosed with bronchitis.  Past Medical History:  Diagnosis Date  . Diabetes mellitus   . Hypertension   . Obesity (BMI 35.0-39.9 without comorbidity)   . Vertigo     There are no active problems to display for this patient.   Past Surgical History:  Procedure Laterality Date  . BREAST SURGERY       OB History    Gravida      Para      Term      Preterm      AB      Living  1     SAB      TAB      Ectopic      Multiple      Live Births               Home Medications    Prior to Admission medications   Medication Sig Start Date End Date Taking? Authorizing Provider  albuterol (PROVENTIL HFA;VENTOLIN HFA) 108 (90 Base) MCG/ACT inhaler Inhale 1-2 puffs into the lungs every 6 (six) hours as needed for wheezing or shortness of breath. 06/14/17  Yes Long, Wonda Olds, MD  ALLERGY RELIEF 10 MG tablet  11/13/17  Yes [provider]  amLODipine (NORVASC) 10 MG tablet Take 10 mg by mouth daily.   Yes [provider]  aspirin 81 MG EC tablet Take 81 mg every other day by mouth.    Yes [provider]  atenolol (TENORMIN) 100 MG tablet Take 100 mg by mouth every morning.    Yes [provider]  Dulaglutide (TRULICITY [place]) Inject 1 application once a week into the skin. Fridays   Yes [provider]  fluticasone (FLONASE) 50 MCG/ACT nasal spray Place 2 sprays into both nostrils daily. 05/27/17  Yes Noemi Chapel, MD  glipiZIDE (GLUCOTROL) 10 MG tablet Take 10 mg by mouth 2 (two) times daily before a meal.   Yes [provider]  ibuprofen (ADVIL,MOTRIN) 200 MG tablet Take 400 mg daily as needed by mouth for headache or mild pain.   Yes [provider]  JANUVIA 50 MG tablet  12/13/17  Yes [provider]  lisinopril-hydrochlorothiazide (PRINZIDE,ZESTORETIC) 20-25 MG per tablet Take 1 tablet by mouth daily. 11/06/10  Yes Charlena Cross, MD  lovastatin (MEVACOR) 20 MG tablet  11/13/17  Yes [provider]  Polyvinyl Alcohol-Povidone PF (REFRESH) 1.4-0.6 % SOLN Place  1 drop into both eyes as needed (dry eyes/irritation).     [provider]  traMADol (ULTRAM) 50 MG tablet Take 1 tablet (50 mg total) every 6 (six) hours as needed by mouth. Patient not taking: Reported on 01/22/2017 01/14/17   Lily Kocher, PA-C    Family History History reviewed. No pertinent family history.  Social History Social History   Tobacco Use  . Smoking status: Never Smoker  . Smokeless tobacco: Never Used  Substance Use Topics  . Alcohol use: No  . Drug use: No     Allergies   Patient has no known allergies.   Review of Systems Review of Systems  Constitutional: Negative for fever.  HENT: Positive for congestion (chest). Negative for rhinorrhea and sore throat.   Respiratory: Positive for cough and wheezing. Negative for shortness of breath.   Cardiovascular: Negative for chest pain and leg swelling.  All other systems reviewed and are  negative.    Physical Exam Updated Vital Signs BP (!) 153/88 (BP Location: Right Arm)   Pulse 71   Temp 97.8 F (36.6 C) (Oral)   Resp 18   LMP 03/16/2012   SpO2 96%   Physical Exam  Constitutional: She appears well-developed and well-nourished. No distress.  HENT:  Head: Normocephalic and atraumatic.  Right Ear: External ear normal.  Left Ear: External ear normal.  Nose: Nose normal.  Eyes: Right eye exhibits no discharge. Left eye exhibits no discharge.  Cardiovascular: Normal rate, regular rhythm and normal heart sounds.  Pulmonary/Chest: Effort normal. No accessory muscle usage. No tachypnea. She has wheezes (mild, expiratory).  Speaks in complete sentences without difficulty  Abdominal: Soft. There is no tenderness.  Musculoskeletal: She exhibits no edema.  Neurological: She is alert.  Skin: Skin is warm and dry. She is not diaphoretic.  Psychiatric: Her mood appears not anxious.  Nursing note and vitals reviewed.    ED Treatments / Results  Labs (all labs ordered are listed, but only abnormal results are displayed) Labs Reviewed - No data to display  EKG None  Radiology Dg Chest 2 View  Result Date: 01/05/2018 CLINICAL DATA:  Cough and wheezing EXAM: CHEST - 2 VIEW COMPARISON:  June 14, 2017 FINDINGS: There is no edema or consolidation. The heart size and pulmonary vascularity are normal. No adenopathy. No evident bone lesions. IMPRESSION: No edema or consolidation. Electronically Signed   By: Lowella Grip III M.D.   On: 01/05/2018 14:10    Procedures Procedures (including critical care time)  Medications Ordered in ED Medications  albuterol (PROVENTIL HFA;VENTOLIN HFA) 108 (90 Base) MCG/ACT inhaler 2 puff (2 puffs Inhalation Given 01/05/18 1307)     Initial Impression / Assessment and Plan / ED Course  I have reviewed the triage vital signs and the nursing notes.  Pertinent labs & imaging results that were available during my care of the patient  were reviewed by me and considered in my medical decision making (see chart for details).     Patient feels better after an albuterol inhaler.  Is unclear why she is been having persistent wheezing type symptoms but she is not short of breath or having signs/symptoms of CHF.  I think she will need outpatient evaluation of this with a pulmonologist and she will be referred to one.  However with such prolonged symptoms are not sure steroids would be in her best interest given it does not seem to be an acute exacerbation, and she has diabetes and this could worsen her  glucose.  Thus, I will give her a different inhaler and see if this helps.  Discussed importance of following up with pulmonology.  Return precautions.  Final Clinical Impressions(s) / ED Diagnoses   Final diagnoses:  Wheezing    ED Discharge Orders    None       Sherwood Gambler, MD 01/05/18 1521

## 2018-01-05 NOTE — ED Triage Notes (Addendum)
Patient complaining of cough and wheezing x 1 month. States wheezing is worse when lying down.

## 2018-01-05 NOTE — ED Notes (Signed)
Patient transported to X-ray 

## 2018-01-05 NOTE — ED Notes (Signed)
RT called and made aware

## 2018-04-28 ENCOUNTER — Other Ambulatory Visit: Payer: Self-pay

## 2018-04-28 ENCOUNTER — Emergency Department (HOSPITAL_COMMUNITY): Payer: Self-pay

## 2018-04-28 ENCOUNTER — Encounter (HOSPITAL_COMMUNITY): Payer: Self-pay | Admitting: Emergency Medicine

## 2018-04-28 ENCOUNTER — Emergency Department (HOSPITAL_COMMUNITY)
Admission: EM | Admit: 2018-04-28 | Discharge: 2018-04-29 | Disposition: A | Discharge status: Home / Self Care | Payer: Self-pay | Attending: Emergency Medicine | Admitting: Emergency Medicine

## 2018-04-28 DIAGNOSIS — R42 Dizziness and giddiness: Secondary | ICD-10-CM | POA: Insufficient documentation

## 2018-04-28 DIAGNOSIS — Z7982 Long term (current) use of aspirin: Secondary | ICD-10-CM | POA: Insufficient documentation

## 2018-04-28 DIAGNOSIS — Z7984 Long term (current) use of oral hypoglycemic drugs: Secondary | ICD-10-CM | POA: Insufficient documentation

## 2018-04-28 DIAGNOSIS — Z79899 Other long term (current) drug therapy: Secondary | ICD-10-CM | POA: Insufficient documentation

## 2018-04-28 DIAGNOSIS — J111 Influenza due to unidentified influenza virus with other respiratory manifestations: Secondary | ICD-10-CM | POA: Insufficient documentation

## 2018-04-28 DIAGNOSIS — N289 Disorder of kidney and ureter, unspecified: Secondary | ICD-10-CM | POA: Insufficient documentation

## 2018-04-28 DIAGNOSIS — E119 Type 2 diabetes mellitus without complications: Secondary | ICD-10-CM | POA: Insufficient documentation

## 2018-04-28 DIAGNOSIS — R69 Illness, unspecified: Secondary | ICD-10-CM

## 2018-04-28 DIAGNOSIS — I10 Essential (primary) hypertension: Secondary | ICD-10-CM | POA: Insufficient documentation

## 2018-04-28 LAB — DIFFERENTIAL
ABS IMMATURE GRANULOCYTES: 0.01 10*3/uL (ref 0.00–0.07)
BASOS PCT: 1 %
Basophils Absolute: 0 10*3/uL (ref 0.0–0.1)
EOS ABS: 0.1 10*3/uL (ref 0.0–0.5)
Eosinophils Relative: 2 %
Immature Granulocytes: 0 %
LYMPHS ABS: 2.5 10*3/uL (ref 0.7–4.0)
Lymphocytes Relative: 44 %
MONO ABS: 0.8 10*3/uL (ref 0.1–1.0)
MONOS PCT: 15 %
NEUTROS ABS: 2.2 10*3/uL (ref 1.7–7.7)
Neutrophils Relative %: 38 %

## 2018-04-28 LAB — CBC
HCT: 41.8 % (ref 36.0–46.0)
Hemoglobin: 13 g/dL (ref 12.0–15.0)
MCH: 26.3 pg (ref 26.0–34.0)
MCHC: 31.1 g/dL (ref 30.0–36.0)
MCV: 84.6 fL (ref 80.0–100.0)
PLATELETS: 297 10*3/uL (ref 150–400)
RBC: 4.94 MIL/uL (ref 3.87–5.11)
RDW: 14 % (ref 11.5–15.5)
WBC: 5.6 10*3/uL (ref 4.0–10.5)
nRBC: 0 % (ref 0.0–0.2)

## 2018-04-28 MED ORDER — IPRATROPIUM-ALBUTEROL 0.5-2.5 (3) MG/3ML IN SOLN
3.0000 mL | Freq: Once | RESPIRATORY_TRACT | Status: AC
  Administered 2018-04-28: 3 mL via RESPIRATORY_TRACT
  Filled 2018-04-28: qty 3

## 2018-04-28 MED ORDER — MECLIZINE HCL 12.5 MG PO TABS
25.0000 mg | ORAL_TABLET | Freq: Once | ORAL | Status: AC
  Administered 2018-04-29: 25 mg via ORAL
  Filled 2018-04-28: qty 2

## 2018-04-28 NOTE — ED Triage Notes (Signed)
Pt states nasal congestion, cough, ear pain, and feeling as though she is off balance. Pt denies the room spinning. No neuro deficits, gait is steady and even. No OTC medications. Sx have been ongoing since Thursday.

## 2018-04-28 NOTE — ED Notes (Signed)
Pt using restroom

## 2018-04-28 NOTE — ED Notes (Signed)
Pt back to room with urine sample . resp to room

## 2018-04-28 NOTE — ED Provider Notes (Signed)
[facility] EMERGENCY DEPARTMENT Provider Note   CSN: [id number] Arrival date & time: 04/28/18  1930    History   Chief Complaint Chief Complaint  Patient presents with  . Cough    HPI Lainy Wrobleski is a 63 y.o. female.   The history is provided by the patient.  She has history of hypertension, diabetes, vertigo and comes in with 2-day history of cough productive of yellow sputum with associated chills.  There has been some chest soreness but no true arthralgias or myalgias.  She denies dyspnea or nausea or vomiting.  She had 2 loose bowel movements today.  She has also been feeling lightheaded and off-balance for these 2 days.  She denies vertigo.  She has not done anything to treat her symptoms.  She has had a sick contacts.  She did not have the influenza immunization this season.  She is also complaining of sharp pain in her left ear.  Past Medical History:  Diagnosis Date  . Diabetes mellitus   . Hypertension   . Obesity (BMI 35.0-39.9 without comorbidity)   . Vertigo     There are no active problems to display for this patient.   Past Surgical History:  Procedure Laterality Date  . BREAST SURGERY       OB History    Gravida      Para      Term      Preterm      AB      Living  1     SAB      TAB      Ectopic      Multiple      Live Births               Home Medications    Prior to Admission medications   Medication Sig Start Date End Date Taking? Authorizing Provider  albuterol (PROVENTIL HFA;VENTOLIN HFA) 108 (90 Base) MCG/ACT inhaler Inhale 1-2 puffs into the lungs every 6 (six) hours as needed for wheezing or shortness of breath. 06/14/17   Long, Wonda Olds, MD  ALLERGY RELIEF 10 MG tablet  11/13/17   [provider]  amLODipine (NORVASC) 10 MG tablet Take 10 mg by mouth daily.    [provider]  aspirin 81 MG EC tablet Take 81 mg every other day by mouth.     [provider]  atenolol (TENORMIN) 100 MG  tablet Take 100 mg by mouth every morning.     [provider]  Dulaglutide (TRULICITY [place]) Inject 1 application once a week into the skin. Fridays    [provider]  fluticasone (FLONASE) 50 MCG/ACT nasal spray Place 2 sprays into both nostrils daily. 05/27/17   Noemi Chapel, MD  glipiZIDE (GLUCOTROL) 10 MG tablet Take 10 mg by mouth 2 (two) times daily before a meal.    [provider]  ibuprofen (ADVIL,MOTRIN) 200 MG tablet Take 400 mg daily as needed by mouth for headache or mild pain.    [provider]  JANUVIA 50 MG tablet  12/13/17   [provider]  lisinopril-hydrochlorothiazide (PRINZIDE,ZESTORETIC) 20-25 MG per tablet Take 1 tablet by mouth daily. 11/06/10   Charlena Cross, MD  lovastatin (MEVACOR) 20 MG tablet  11/13/17   [provider]  Polyvinyl Alcohol-Povidone PF (REFRESH) 1.4-0.6 % SOLN Place 1 drop into both eyes as needed (dry eyes/irritation).     [provider]  traMADol (ULTRAM) 50 MG tablet Take 1 tablet (  50 mg total) every 6 (six) hours as needed by mouth. Patient not taking: Reported on 01/22/2017 01/14/17   Lily Kocher, PA-C    Family History History reviewed. No pertinent family history.  Social History Social History   Tobacco Use  . Smoking status: Never Smoker  . Smokeless tobacco: Never Used  Substance Use Topics  . Alcohol use: No  . Drug use: No     Allergies   Patient has no known allergies.   Review of Systems Review of Systems  All other systems reviewed and are negative.    Physical Exam Updated Vital Signs BP 135/70 (BP Location: Right Arm)   Pulse 78   Temp 98.9 F (37.2 C) (Temporal)   Resp 18   Ht 5\' 4"  (1.626 m)   Wt 91.2 kg   LMP 03/16/2012   SpO2 96%   BMI 34.50 kg/m   Physical Exam Vitals signs and nursing note reviewed.    63 year old female, resting comfortably and in no acute distress. Vital signs are normal. Oxygen saturation is 96%, which is  normal. Head is normocephalic and atraumatic.  Tympanic membranes are clear.  PERRLA, EOMI. Oropharynx is clear. Neck is nontender and supple without adenopathy or JVD. Back is nontender and there is no CVA tenderness. Lungs have mild expiratory wheezes which are more on the left, there are no rales or rhonchi. Chest is nontender. Heart has regular rate and rhythm without murmur. Abdomen is soft, flat, nontender without masses or hepatosplenomegaly and peristalsis is normoactive. Extremities have no cyanosis or edema, full range of motion is present. Skin is warm and dry without rash. Neurologic: Mental status is normal, cranial nerves are intact, there is no nystagmus, there are no motor or sensory deficits.  Gait is slightly off balance.  On Romberg testing, there is mild unsteadiness with a slight tendency to fall to the right.  ED Treatments / Results  Labs (all labs ordered are listed, but only abnormal results are displayed) Labs Reviewed  COMPREHENSIVE METABOLIC PANEL - Abnormal; Notable for the following components:      Result Value   Sodium 134 (*)    Glucose, Bld 133 (*)    Creatinine, Ser 1.55 (*)    Calcium 8.4 (*)    GFR calc non Af Amer 35 (*)    GFR calc Af Amer 41 (*)    All other components within normal limits  URINALYSIS, ROUTINE W REFLEX MICROSCOPIC - Abnormal; Notable for the following components:   APPearance HAZY (*)    Hgb urine dipstick SMALL (*)    Leukocytes,Ua TRACE (*)    Bacteria, UA RARE (*)    All other components within normal limits  ETHANOL  PROTIME-INR  APTT  CBC  DIFFERENTIAL  RAPID URINE DRUG SCREEN, HOSP PERFORMED    EKG EKG Interpretation  Date/Time:  Saturday April 28 2018 23:22:05 EST Ventricular Rate:  76 PR Interval:    QRS Duration: 92 QT Interval:  484 QTC Calculation: 545 R Axis:   53 Text Interpretation:  Sinus rhythm Low voltage, extremity leads Prolonged QT interval Baseline wander in lead(s) V1 When compared with  ECG of 09/01/2016, Low voltage QRS is now present QT has lengthened Confirmed by Delora Fuel ([id number]) on 04/28/2018 11:36:57 PM   Radiology Dg Chest 2 View  Result Date: 04/29/2018 CLINICAL DATA:  63 year old female with vertigo. EXAM: CHEST - 2 VIEW COMPARISON:  Chest radiograph dated 01/05/2018 FINDINGS: The heart size and mediastinal contours are within  normal limits. Both lungs are clear. The visualized skeletal structures are unremarkable. IMPRESSION: No active cardiopulmonary disease. Electronically Signed   By: Anner Crete M.D.   On: 04/29/2018 00:19   Ct Head Wo Contrast  Result Date: 04/29/2018 CLINICAL DATA:  63 year old female with vertigo. EXAM: CT HEAD WITHOUT CONTRAST TECHNIQUE: Contiguous axial images were obtained from the base of the skull through the vertex without intravenous contrast. COMPARISON:  None. FINDINGS: Brain: The ventricles and sulci appropriate size for patient's age. Minimal periventricular and deep white matter chronic microvascular ischemic changes noted. There is no acute intracranial hemorrhage. No mass effect or midline shift. No extra-axial fluid collection. Vascular: No hyperdense vessel or unexpected calcification. Skull: Normal. Negative for fracture or focal lesion. Sinuses/Orbits: Mild mucoperiosteal thickening of paranasal sinuses. No air-fluid levels. The mastoid air cells are clear. Other: None IMPRESSION: No acute intracranial pathology. Electronically Signed   By: Anner Crete M.D.   On: 04/29/2018 00:17    Procedures Procedures   Medications Ordered in ED Medications  dexamethasone (DECADRON) tablet 10 mg (has no administration in time range)  albuterol (PROVENTIL HFA;VENTOLIN HFA) 108 (90 Base) MCG/ACT inhaler 2 puff (has no administration in time range)  ipratropium-albuterol (DUONEB) 0.5-2.5 (3) MG/3ML nebulizer solution 3 mL (3 mLs Nebulization Given 04/28/18 2339)  meclizine (ANTIVERT) tablet 25 mg (25 mg Oral Given 04/29/18 0019)      Initial Impression / Assessment and Plan / ED Course  I have reviewed the triage vital signs and the nursing notes.  Pertinent labs & imaging results that were available during my care of the patient were reviewed by me and considered in my medical decision making (see chart for details).  Respiratory tract infection, possible influenza.  She is outside the treatment window for antiviral therapy, so will not check influenza PCR.  Dizziness and unsteadiness probably related to viral illness, but could conceivably be central vertigo.  Stroke work-up is initiated.  She will be given albuterol with ipratropium via nebulizer and will check chest x-ray.  Old records are reviewed, and she has several ED visits for bronchitis, wheezing, respiratory tract infections.  She feels significantly better after above-noted treatment.  Dizziness is significantly improved.  CT of the head is unremarkable.  Chest x-ray shows no evidence of pneumonia.  ECG shows no acute changes.  Given improvement with oral meclizine, doubt stroke and I do not feel she needs to be sent for MRI scan.  She is given an albuterol inhaler to use at home, given a dose of dexamethasone in the ED, and discharged with prescription for meclizine.  Follow-up with PCP, return precautions discussed.  Final Clinical Impressions(s) / ED Diagnoses   Final diagnoses:  Influenza-like illness  Dizziness  Renal insufficiency    ED Discharge Orders         Ordered    meclizine (ANTIVERT) 25 MG tablet  3 times daily PRN     04/29/18 4496           Delora Fuel, MD 75/91/63 [phone]

## 2018-04-29 LAB — URINALYSIS, ROUTINE W REFLEX MICROSCOPIC
Bilirubin Urine: NEGATIVE
Glucose, UA: NEGATIVE mg/dL
Ketones, ur: NEGATIVE mg/dL
Nitrite: NEGATIVE
Protein, ur: NEGATIVE mg/dL
Specific Gravity, Urine: 1.012 (ref 1.005–1.030)
pH: 5 (ref 5.0–8.0)

## 2018-04-29 LAB — APTT: APTT: 36 s (ref 24–36)

## 2018-04-29 LAB — COMPREHENSIVE METABOLIC PANEL
ALT: 28 U/L (ref 0–44)
AST: 30 U/L (ref 15–41)
Albumin: 3.6 g/dL (ref 3.5–5.0)
Alkaline Phosphatase: 62 U/L (ref 38–126)
Anion gap: 10 (ref 5–15)
BUN: 23 mg/dL (ref 8–23)
CHLORIDE: 100 mmol/L (ref 98–111)
CO2: 24 mmol/L (ref 22–32)
Calcium: 8.4 mg/dL — ABNORMAL LOW (ref 8.9–10.3)
Creatinine, Ser: 1.55 mg/dL — ABNORMAL HIGH (ref 0.44–1.00)
GFR, EST AFRICAN AMERICAN: 41 mL/min — AB (ref >60)
GFR, EST NON AFRICAN AMERICAN: 35 mL/min — AB (ref >60)
Glucose, Bld: 133 mg/dL — ABNORMAL HIGH (ref 70–99)
POTASSIUM: 3.6 mmol/L (ref 3.5–5.1)
SODIUM: 134 mmol/L — AB (ref 135–145)
Total Bilirubin: 0.4 mg/dL (ref 0.3–1.2)
Total Protein: 7.3 g/dL (ref 6.5–8.1)

## 2018-04-29 LAB — RAPID URINE DRUG SCREEN, HOSP PERFORMED
AMPHETAMINES: NOT DETECTED
BARBITURATES: NOT DETECTED
Benzodiazepines: NOT DETECTED
Cocaine: NOT DETECTED
Opiates: NOT DETECTED
Tetrahydrocannabinol: NOT DETECTED

## 2018-04-29 LAB — PROTIME-INR
INR: 1.06
Prothrombin Time: 13.7 seconds (ref 11.4–15.2)

## 2018-04-29 LAB — ETHANOL

## 2018-04-29 MED ORDER — ALBUTEROL SULFATE HFA 108 (90 BASE) MCG/ACT IN AERS
2.0000 | INHALATION_SPRAY | RESPIRATORY_TRACT | Status: DC | PRN
  Administered 2018-04-29: 2 via RESPIRATORY_TRACT
  Filled 2018-04-29: qty 6.7

## 2018-04-29 MED ORDER — DEXAMETHASONE 4 MG PO TABS
10.0000 mg | ORAL_TABLET | Freq: Once | ORAL | Status: AC
  Administered 2018-04-29: 10 mg via ORAL
  Filled 2018-04-29: qty 3

## 2018-04-29 MED ORDER — MECLIZINE HCL 25 MG PO TABS: 25.0000 mg | ORAL_TABLET | Freq: Three times a day (TID) | ORAL | 0 refills | Status: DC | PRN

## 2018-04-29 NOTE — Discharge Instructions (Addendum)
Return if symptoms are getting worse.  Please monitor your blood sugar - it may go higher with the dose of dexamethasone you got tonight.

## 2018-05-03 ENCOUNTER — Emergency Department (HOSPITAL_COMMUNITY)
Admission: EM | Admit: 2018-05-03 | Discharge: 2018-05-03 | Disposition: A | Discharge status: Home / Self Care | Payer: Self-pay | Attending: Emergency Medicine | Admitting: Emergency Medicine

## 2018-05-03 ENCOUNTER — Other Ambulatory Visit: Payer: Self-pay

## 2018-05-03 ENCOUNTER — Emergency Department (HOSPITAL_COMMUNITY): Payer: Self-pay

## 2018-05-03 ENCOUNTER — Encounter (HOSPITAL_COMMUNITY): Payer: Self-pay | Admitting: Emergency Medicine

## 2018-05-03 DIAGNOSIS — E119 Type 2 diabetes mellitus without complications: Secondary | ICD-10-CM | POA: Insufficient documentation

## 2018-05-03 DIAGNOSIS — J101 Influenza due to other identified influenza virus with other respiratory manifestations: Secondary | ICD-10-CM | POA: Insufficient documentation

## 2018-05-03 DIAGNOSIS — Z79899 Other long term (current) drug therapy: Secondary | ICD-10-CM | POA: Insufficient documentation

## 2018-05-03 DIAGNOSIS — I10 Essential (primary) hypertension: Secondary | ICD-10-CM | POA: Insufficient documentation

## 2018-05-03 DIAGNOSIS — J111 Influenza due to unidentified influenza virus with other respiratory manifestations: Secondary | ICD-10-CM

## 2018-05-03 DIAGNOSIS — Z7982 Long term (current) use of aspirin: Secondary | ICD-10-CM | POA: Insufficient documentation

## 2018-05-03 LAB — COMPREHENSIVE METABOLIC PANEL
ALT: 26 U/L (ref 0–44)
AST: 17 U/L (ref 15–41)
Albumin: 3.3 g/dL — ABNORMAL LOW (ref 3.5–5.0)
Alkaline Phosphatase: 66 U/L (ref 38–126)
Anion gap: 7 (ref 5–15)
BILIRUBIN TOTAL: 0.5 mg/dL (ref 0.3–1.2)
BUN: 18 mg/dL (ref 8–23)
CALCIUM: 8.5 mg/dL — AB (ref 8.9–10.3)
CO2: 25 mmol/L (ref 22–32)
Chloride: 105 mmol/L (ref 98–111)
Creatinine, Ser: 1.33 mg/dL — ABNORMAL HIGH (ref 0.44–1.00)
GFR calc Af Amer: 49 mL/min — ABNORMAL LOW (ref >60)
GFR, EST NON AFRICAN AMERICAN: 42 mL/min — AB (ref >60)
Glucose, Bld: 323 mg/dL — ABNORMAL HIGH (ref 70–99)
Potassium: 4.3 mmol/L (ref 3.5–5.1)
Sodium: 137 mmol/L (ref 135–145)
TOTAL PROTEIN: 6.8 g/dL (ref 6.5–8.1)

## 2018-05-03 LAB — CBC WITH DIFFERENTIAL/PLATELET
Abs Immature Granulocytes: 0.01 10*3/uL (ref 0.00–0.07)
BASOS PCT: 0 %
Basophils Absolute: 0 10*3/uL (ref 0.0–0.1)
EOS ABS: 0.1 10*3/uL (ref 0.0–0.5)
Eosinophils Relative: 1 %
HCT: 41.5 % (ref 36.0–46.0)
Hemoglobin: 12.6 g/dL (ref 12.0–15.0)
Immature Granulocytes: 0 %
Lymphocytes Relative: 31 %
Lymphs Abs: 2.2 10*3/uL (ref 0.7–4.0)
MCH: 26 pg (ref 26.0–34.0)
MCHC: 30.4 g/dL (ref 30.0–36.0)
MCV: 85.6 fL (ref 80.0–100.0)
Monocytes Absolute: 0.3 10*3/uL (ref 0.1–1.0)
Monocytes Relative: 5 %
Neutro Abs: 4.4 10*3/uL (ref 1.7–7.7)
Neutrophils Relative %: 63 %
Platelets: 319 10*3/uL (ref 150–400)
RBC: 4.85 MIL/uL (ref 3.87–5.11)
RDW: 13.7 % (ref 11.5–15.5)
WBC: 7.1 10*3/uL (ref 4.0–10.5)
nRBC: 0 % (ref 0.0–0.2)

## 2018-05-03 LAB — INFLUENZA PANEL BY PCR (TYPE A & B)
Influenza A By PCR: POSITIVE — AB
Influenza B By PCR: NEGATIVE

## 2018-05-03 MED ORDER — SODIUM CHLORIDE 0.9 % IV BOLUS
1000.0000 mL | Freq: Once | INTRAVENOUS | Status: AC
  Administered 2018-05-03: 1000 mL via INTRAVENOUS

## 2018-05-03 NOTE — ED Provider Notes (Signed)
[facility] EMERGENCY DEPARTMENT Provider Note   CSN: [id number] Arrival date & time: 05/03/18  1223    History   Chief Complaint Chief Complaint  Patient presents with  . Influenza    HPI Brooke [name] is a 63 y.o. female.     HPI Patient presents with a second time in 1 week with concern of generalized fatigue, anorexia, ongoing loose stool. She was seen here a few days ago, had evaluation, most notable for demonstration of dehydration she notes that since that time she continues to have decreasing energy, persistent anorexia, and less than 5 loose stools daily. She denies focal pain including chest or abdominal discomfort, denies fever. She is not appreciably changed with any OTC medication. She continues to take her prescribed medication as directed, including her antihyperglycemic regimen.  Past Medical History:  Diagnosis Date  . Diabetes mellitus   . Hypertension   . Obesity (BMI 35.0-39.9 without comorbidity)   . Vertigo     There are no active problems to display for this patient.   Past Surgical History:  Procedure Laterality Date  . BREAST SURGERY       OB History    Gravida      Para      Term      Preterm      AB      Living  1     SAB      TAB      Ectopic      Multiple      Live Births               Home Medications    Prior to Admission medications   Medication Sig Start Date End Date Taking? Authorizing Provider  albuterol (PROVENTIL HFA;VENTOLIN HFA) 108 (90 Base) MCG/ACT inhaler Inhale 1-2 puffs into the lungs every 6 (six) hours as needed for wheezing or shortness of breath. 06/14/17   Long, Wonda Olds, MD  ALLERGY RELIEF 10 MG tablet  11/13/17   [provider]  amLODipine (NORVASC) 10 MG tablet Take 10 mg by mouth daily.    [provider]  aspirin 81 MG EC tablet Take 81 mg every other day by mouth.     [provider]  atenolol (TENORMIN) 100 MG tablet Take 100 mg by mouth every morning.      [provider]  Dulaglutide (TRULICITY [place]) Inject 1 application once a week into the skin. Fridays    [provider]  fluticasone (FLONASE) 50 MCG/ACT nasal spray Place 2 sprays into both nostrils daily. 05/27/17   Noemi Chapel, MD  glipiZIDE (GLUCOTROL) 10 MG tablet Take 10 mg by mouth 2 (two) times daily before a meal.    [provider]  ibuprofen (ADVIL,MOTRIN) 200 MG tablet Take 400 mg daily as needed by mouth for headache or mild pain.    [provider]  JANUVIA 50 MG tablet  12/13/17   [provider]  lisinopril-hydrochlorothiazide (PRINZIDE,ZESTORETIC) 20-25 MG per tablet Take 1 tablet by mouth daily. 11/06/10   Charlena Cross, MD  lovastatin (MEVACOR) 20 MG tablet  11/13/17   [provider]  meclizine (ANTIVERT) 25 MG tablet Take 1 tablet (25 mg total) by mouth 3 (three) times daily as needed for dizziness. 3/71/06   Delora Fuel, MD  Polyvinyl Alcohol-Povidone PF (REFRESH) 1.4-0.6 % SOLN Place 1 drop into both eyes as needed (dry eyes/irritation).     [provider]  traMADol (ULTRAM) 50 MG  tablet Take 1 tablet (50 mg total) every 6 (six) hours as needed by mouth. Patient not taking: Reported on 01/22/2017 01/14/17   Lily Kocher, PA-C    Family History No family history on file.  Social History Social History   Tobacco Use  . Smoking status: Never Smoker  . Smokeless tobacco: Never Used  Substance Use Topics  . Alcohol use: No  . Drug use: No     Allergies   Patient has no known allergies.   Review of Systems Review of Systems  Constitutional:       Per HPI, otherwise negative  HENT:       Per HPI, otherwise negative  Respiratory:       Per HPI, otherwise negative  Cardiovascular:       Per HPI, otherwise negative  Gastrointestinal: Negative for vomiting.       Multiple loose stool daily  Endocrine:       Negative aside from HPI  Genitourinary:       Neg aside from HPI     Musculoskeletal:       Per HPI, otherwise negative  Skin: Negative.   Neurological: Negative for syncope.     Physical Exam Updated Vital Signs BP (!) 158/76 (BP Location: Right Arm)   Pulse 76   Temp (!) 97.1 F (36.2 C) (Temporal)   Resp 10   Ht 5\' 4"  (1.626 m)   Wt 89.6 kg   LMP 03/16/2012   SpO2 94%   BMI 33.92 kg/m   Physical Exam Vitals signs and nursing note reviewed.  Constitutional:      General: She is not in acute distress.    Appearance: She is well-developed.  HENT:     Head: Normocephalic and atraumatic.  Eyes:     Conjunctiva/sclera: Conjunctivae normal.  Cardiovascular:     Rate and Rhythm: Normal rate and regular rhythm.  Pulmonary:     Effort: Pulmonary effort is normal. No respiratory distress.     Breath sounds: Normal breath sounds. No stridor.  Abdominal:     General: There is no distension.     Tenderness: There is no abdominal tenderness. There is no guarding.  Skin:    General: Skin is warm and dry.  Neurological:     Mental Status: She is alert and oriented to person, place, and time.     Cranial Nerves: No cranial nerve deficit.      ED Treatments / Results  Labs (all labs ordered are listed, but only abnormal results are displayed) Labs Reviewed  COMPREHENSIVE METABOLIC PANEL - Abnormal; Notable for the following components:      Result Value   Glucose, Bld 323 (*)    Creatinine, Ser 1.33 (*)    Calcium 8.5 (*)    Albumin 3.3 (*)    GFR calc non Af Amer 42 (*)    GFR calc Af Amer 49 (*)    All other components within normal limits  INFLUENZA PANEL BY PCR (TYPE A & B) - Abnormal; Notable for the following components:   Influenza A By PCR POSITIVE (*)    All other components within normal limits  CBC WITH DIFFERENTIAL/PLATELET    EKG None  Radiology Dg Chest 2 View  Result Date: 05/03/2018 CLINICAL DATA:  Cough and diarrhea for 5 days EXAM: CHEST - 2 VIEW COMPARISON:  04/29/2018 FINDINGS: Normal heart size. Lungs  clear. No pneumothorax. No pleural effusion. IMPRESSION: No active cardiopulmonary disease. Electronically Signed   By:  Marybelle Killings M.D.   On: 05/03/2018 14:15    Procedures Procedures (including critical care time)  Medications Ordered in ED Medications  sodium chloride 0.9 % bolus 1,000 mL (has no administration in time range)     Initial Impression / Assessment and Plan / ED Course  I have reviewed the triage vital signs and the nursing notes.  Pertinent labs & imaging results that were available during my care of the patient were reviewed by me and considered in my medical decision making (see chart for details).    Chart review performed after initial evaluation notable for creatinine of 1.5 during her most recent evaluation. This is higher than usual    3:20 PM On repeat exam the patient is awake and alert. Labs notable for improved creatinine value from a few days ago. Labs also notable for positive influenza result. Patient has been symptomatic for most of the week, not a candidate for Tamiflu therapy. Patient remains hemodynamically unremarkable, when she has finished IV fluid resuscitation, she will be discharged in stable condition with outpatient follow-up.  Final Clinical Impressions(s) / ED Diagnoses  Influenza   Carmin Muskrat, MD 05/03/18 1520

## 2018-05-03 NOTE — Discharge Instructions (Addendum)
As discussed, your evaluation today has been largely reassuring.  But, it is important that you monitor your condition carefully, and do not hesitate to return to the ED if you develop new, or concerning changes in your condition.  In regards to your influenza, it is important that you stay well-hydrated, get plenty of rest, and follow-up with your physician for appropriate ongoing care.

## 2018-05-03 NOTE — ED Triage Notes (Addendum)
Cough, diarrhea and not feeling well since Saturday. Has had 2 BM in past 24 hours.  Seen here Saturday for the same

## 2019-03-13 ENCOUNTER — Encounter: Payer: Self-pay | Admitting: Family Medicine

## 2019-03-13 ENCOUNTER — Ambulatory Visit (INDEPENDENT_AMBULATORY_CARE_PROVIDER_SITE_OTHER): Payer: Medicare HMO | Admitting: Family Medicine

## 2019-03-13 ENCOUNTER — Other Ambulatory Visit: Payer: Self-pay

## 2019-03-13 VITALS — BP 137/76 | HR 75 | Temp 97.5°F | Resp 20 | Ht 64.0 in | Wt 202.0 lb

## 2019-03-13 DIAGNOSIS — E119 Type 2 diabetes mellitus without complications: Secondary | ICD-10-CM | POA: Diagnosis not present

## 2019-03-13 DIAGNOSIS — J410 Simple chronic bronchitis: Secondary | ICD-10-CM

## 2019-03-13 DIAGNOSIS — E1165 Type 2 diabetes mellitus with hyperglycemia: Secondary | ICD-10-CM | POA: Insufficient documentation

## 2019-03-13 DIAGNOSIS — E1159 Type 2 diabetes mellitus with other circulatory complications: Secondary | ICD-10-CM

## 2019-03-13 DIAGNOSIS — E1169 Type 2 diabetes mellitus with other specified complication: Secondary | ICD-10-CM | POA: Diagnosis not present

## 2019-03-13 DIAGNOSIS — J42 Unspecified chronic bronchitis: Secondary | ICD-10-CM | POA: Insufficient documentation

## 2019-03-13 DIAGNOSIS — E669 Obesity, unspecified: Secondary | ICD-10-CM | POA: Diagnosis not present

## 2019-03-13 DIAGNOSIS — E785 Hyperlipidemia, unspecified: Secondary | ICD-10-CM

## 2019-03-13 DIAGNOSIS — I10 Essential (primary) hypertension: Secondary | ICD-10-CM | POA: Diagnosis not present

## 2019-03-13 DIAGNOSIS — Z23 Encounter for immunization: Secondary | ICD-10-CM | POA: Diagnosis not present

## 2019-03-13 DIAGNOSIS — E782 Mixed hyperlipidemia: Secondary | ICD-10-CM | POA: Insufficient documentation

## 2019-03-13 LAB — BAYER DCA HB A1C WAIVED: HB A1C (BAYER DCA - WAIVED): 10.4 % — ABNORMAL HIGH (ref <7.0)

## 2019-03-13 MED ORDER — TRULICITY 3 MG/0.5ML ~~LOC~~ SOAJ: 3.0000 mg | SUBCUTANEOUS | 11 refills | Status: DC

## 2019-03-13 MED ORDER — PULMICORT FLEXHALER 90 MCG/ACT IN AEPB: 1.0000 | INHALATION_SPRAY | Freq: Every day | RESPIRATORY_TRACT | 11 refills | Status: DC

## 2019-03-13 NOTE — Addendum Note (Signed)
Addended by: Baruch Gouty on: 03/13/2019 06:57 PM   Modules accepted: Orders

## 2019-03-13 NOTE — Progress Notes (Signed)
Subjective:  Patient ID: Brooke [name], female    DOB: 1956/01/17, 64 y.o.   MRN: [id number]  Patient Care Team: Baruch Gouty, FNP as PCP - General (Family Medicine)   Chief Complaint:  Establish Care (NEW )   HPI: Brooke [name] is a 64 y.o. female presenting on 03/13/2019 for Establish Care (NEW )   Pt presents today to establish care with PCP. Pt was previously [facility] the [facility] for her medical care. Pt has been seen in the ED several times over the years. Pt states she has recently got insurance and needs to establish with a PCP. Pt has a history of diabetes, HTN, hyperlipidemia, bronchitis, and obesity. Pt states she last had blood work in December. This is not available for review in EHR, records were requested. She reports her A1C was 10. Denies adjustments to medications. States her blood sugars run in the 200 range on a regular basis. Denies polyphagia, polydipsia, or polyuria. No paresthesias or neuropathy. She states her blood pressure is usually well controlled. States her diet and exercise needs improvement. No chest pain, headaches, leg selling, shortness of breath, weakness, or confusion. States she takes her cholesterol medication as prescribed without side effects. She does have a history of bronchitis but is not on a daily inhaler. States she has to use her albuterol inhaler several times per week for wheezing and cough. States she has neve been on a daily controlled inhaler. States she does have sputum production with the wheezing and coughing at times, but not often. Never a smoker.      Relevant past medical, surgical, family, and social history reviewed and updated as indicated.  Allergies and medications reviewed and updated. Date reviewed: Chart in Epic.   Past Medical History:  Diagnosis Date  . Bronchitis    frequent bronchitis  . Diabetes mellitus   . Hypertension   . Obesity (BMI 35.0-39.9 without comorbidity)   . Vertigo     Past  Surgical History:  Procedure Laterality Date  . BREAST SURGERY Right 1974    Social History   Socioeconomic History  . Marital status: Married    Spouse name: Donnie  . Number of children: 1  . Years of education: Not on file  . Highest education level: Not on file  Occupational History  . Not on file  Tobacco Use  . Smoking status: Never Smoker  . Smokeless tobacco: Never Used  Substance and Sexual Activity  . Alcohol use: No  . Drug use: No  . Sexual activity: Never  Other Topics Concern  . Not on file  Social History Narrative  . Not on file   Social Determinants of Health   Financial Resource Strain:   . Difficulty of Paying Living Expenses: Not on file  Food Insecurity:   . Worried About Charity fundraiser in the Last Year: Not on file  . Ran Out of Food in the Last Year: Not on file  Transportation Needs:   . Lack of Transportation (Medical): Not on file  . Lack of Transportation (Non-Medical): Not on file  Physical Activity:   . Days of Exercise per Week: Not on file  . Minutes of Exercise per Session: Not on file  Stress:   . Feeling of Stress : Not on file  Social Connections:   . Frequency of Communication with Friends and Family: Not on file  . Frequency of Social Gatherings with Friends and Family: Not on file  .  Attends Religious Services: Not on file  . Active Member of Clubs or Organizations: Not on file  . Attends Archivist Meetings: Not on file  . Marital Status: Not on file  Intimate Partner Violence:   . Fear of Current or Ex-Partner: Not on file  . Emotionally Abused: Not on file  . Physically Abused: Not on file  . Sexually Abused: Not on file    Outpatient Encounter Medications as of 03/13/2019  Medication Sig  . albuterol (PROVENTIL HFA;VENTOLIN HFA) 108 (90 Base) MCG/ACT inhaler Inhale 1-2 puffs into the lungs every 6 (six) hours as needed for wheezing or shortness of breath.  Marland Kitchen amLODipine (NORVASC) 10 MG tablet Take 10 mg  by mouth daily.  Marland Kitchen atenolol (TENORMIN) 100 MG tablet Take 100 mg by mouth every morning.   . Dulaglutide (TRULICITY [place]) Inject 1 application once a week into the skin. Fridays  . glipiZIDE (GLUCOTROL) 10 MG tablet Take 20 mg by mouth 2 (two) times daily before a meal.   . JANUVIA 50 MG tablet   . lisinopril-hydrochlorothiazide (PRINZIDE,ZESTORETIC) 20-25 MG per tablet Take 1 tablet by mouth daily.  Marland Kitchen lovastatin (MEVACOR) 20 MG tablet   . Budesonide (PULMICORT FLEXHALER) 90 MCG/ACT inhaler Inhale 1 puff into the lungs daily.  . [DISCONTINUED] ALLERGY RELIEF 10 MG tablet   . [DISCONTINUED] aspirin 81 MG EC tablet Take 81 mg every other day by mouth.   . [DISCONTINUED] fluticasone (FLONASE) 50 MCG/ACT nasal spray Place 2 sprays into both nostrils daily.  . [DISCONTINUED] ibuprofen (ADVIL,MOTRIN) 200 MG tablet Take 400 mg daily as needed by mouth for headache or mild pain.  . [DISCONTINUED] meclizine (ANTIVERT) 25 MG tablet Take 1 tablet (25 mg total) by mouth 3 (three) times daily as needed for dizziness.  . [DISCONTINUED] Polyvinyl Alcohol-Povidone PF (REFRESH) 1.4-0.6 % SOLN Place 1 drop into both eyes as needed (dry eyes/irritation).   . [DISCONTINUED] traMADol (ULTRAM) 50 MG tablet Take 1 tablet (50 mg total) every 6 (six) hours as needed by mouth. (Patient not taking: Reported on 01/22/2017)   No facility-administered encounter medications on file as of 03/13/2019.    No Known Allergies  Review of Systems  Constitutional: Negative for activity change, appetite change, chills, diaphoresis, fatigue, fever and unexpected weight change.  HENT: Negative.   Eyes: Negative.  Negative for photophobia and visual disturbance.  Respiratory: Positive for cough and wheezing. Negative for chest tightness and shortness of breath.   Cardiovascular: Negative for chest pain, palpitations and leg swelling.  Gastrointestinal: Negative for abdominal pain, blood in stool, constipation, diarrhea, nausea and  vomiting.  Endocrine: Negative.  Negative for cold intolerance, heat intolerance, polydipsia, polyphagia and polyuria.  Genitourinary: Negative for decreased urine volume, difficulty urinating, dysuria, frequency and urgency.  Musculoskeletal: Negative for arthralgias and myalgias.  Skin: Negative.  Negative for color change, pallor and rash.  Allergic/Immunologic: Negative.   Neurological: Negative for dizziness, tremors, seizures, syncope, facial asymmetry, speech difficulty, weakness, light-headedness, numbness and headaches.  Hematological: Negative.   Psychiatric/Behavioral: Negative for confusion, hallucinations, sleep disturbance and suicidal ideas.  All other systems reviewed and are negative.       Objective:  BP 137/76 (BP Location: Left Arm, Cuff Size: Large)   Pulse 75   Temp (!) 97.5 F (36.4 C)   Resp 20   Ht '5\' 4"'  (1.626 m)   Wt 202 lb (91.6 kg)   LMP 03/16/2012   SpO2 94%   BMI 34.67 kg/m    Wt  Readings from Last 3 Encounters:  03/13/19 202 lb (91.6 kg)  05/03/18 197 lb 9.6 oz (89.6 kg)  04/28/18 201 lb (91.2 kg)    Physical Exam Vitals and nursing note reviewed.  Constitutional:      General: She is not in acute distress.    Appearance: Normal appearance. She is well-developed and well-groomed. She is obese. She is not ill-appearing, toxic-appearing or diaphoretic.  HENT:     Head: Normocephalic and atraumatic.     Jaw: There is normal jaw occlusion.     Right Ear: Hearing normal.     Left Ear: Hearing normal.     Nose: Nose normal.     Mouth/Throat:     Lips: Pink.     Mouth: Mucous membranes are moist.     Pharynx: Oropharynx is clear. Uvula midline.  Eyes:     General: Lids are normal.     Extraocular Movements: Extraocular movements intact.     Conjunctiva/sclera: Conjunctivae normal.     Pupils: Pupils are equal, round, and reactive to light.  Neck:     Thyroid: No thyroid mass, thyromegaly or thyroid tenderness.     Vascular: No carotid  bruit or JVD.     Trachea: Trachea and phonation normal.  Cardiovascular:     Rate and Rhythm: Normal rate and regular rhythm.     Chest Wall: PMI is not displaced.     Pulses: Normal pulses.     Heart sounds: Normal heart sounds. No murmur. No friction rub. No gallop.   Pulmonary:     Effort: Pulmonary effort is normal. No accessory muscle usage, prolonged expiration or respiratory distress.     Breath sounds: No stridor. Wheezing (throughout, expiratory) present. No decreased breath sounds, rhonchi or rales.  Abdominal:     General: Abdomen is protuberant. Bowel sounds are normal. There is no distension or abdominal bruit.     Palpations: Abdomen is soft. There is no hepatomegaly or splenomegaly.     Tenderness: There is no abdominal tenderness. There is no right CVA tenderness or left CVA tenderness.     Hernia: No hernia is present.  Musculoskeletal:        General: Normal range of motion.     Cervical back: Normal range of motion and neck supple.     Right lower leg: No edema.     Left lower leg: No edema.  Lymphadenopathy:     Cervical: No cervical adenopathy.  Skin:    General: Skin is warm and dry.     Capillary Refill: Capillary refill takes less than 2 seconds.     Coloration: Skin is not cyanotic, jaundiced or pale.     Findings: No rash.  Neurological:     General: No focal deficit present.     Mental Status: She is alert and oriented to person, place, and time.     Cranial Nerves: Cranial nerves are intact. No cranial nerve deficit.     Sensory: Sensation is intact. No sensory deficit.     Motor: Motor function is intact. No weakness.     Coordination: Coordination is intact. Coordination normal.     Gait: Gait is intact. Gait normal.     Deep Tendon Reflexes: Reflexes are normal and symmetric. Reflexes normal.  Psychiatric:        Attention and Perception: Attention and perception normal.        Mood and Affect: Mood and affect normal.        Speech:  Speech  normal.        Behavior: Behavior normal. Behavior is cooperative.        Thought Content: Thought content normal.        Cognition and Memory: Cognition and memory normal.        Judgment: Judgment normal.    Pertinent labs & imaging results that were available during my care of the patient were reviewed by me and considered in my medical decision making.  Assessment & Plan:  Hajira was seen today for establish care.  Diagnoses and all orders for this visit:  Type 2 diabetes mellitus without complication, without long-term current use of insulin (HCC) A1C pending at time of pt discharge. Will notify if changes need to be made. Labs pending. Will adjust therapy if warranted. Pt aware to get diabetic eye exam yearly, pt to make appointment.  -     CMP14+EGFR -     Thyroid Panel With TSH -     Microalbumin / creatinine urine ratio -     hgba1c  Hyperlipidemia associated with type 2 diabetes mellitus ([place]) Diet encouraged - increase intake of fresh fruits and vegetables, increase intake of lean proteins. Bake, broil, or grill foods. Avoid fried, greasy, and fatty foods. Avoid fast foods. Increase intake of fiber-rich whole grains. Exercise encouraged - at least 150 minutes per week and advance as tolerated.  Goal BMI < 25. Continue medications as prescribed. Follow up in 3-6 months as discussed.  -     Lipid panel  Hypertension associated with diabetes ([place]) BP well controlled. Changes were not made in regimen today. Goal BP is 130/80. Pt aware to report any persistent high or low readings. DASH diet and exercise encouraged. Exercise at least 150 minutes per week and increase as tolerated. Goal BMI > 25. Stress management encouraged. Avoid nicotine and tobacco product use. Avoid excessive alcohol and NSAID's. Avoid more than 2000 mg of sodium daily. Medications as prescribed. Follow up as scheduled.  -     CBC with Differential/Platelet -     Thyroid Panel With TSH -     Microalbumin /  creatinine urine ratio  Obesity (BMI 30-39.9) Diet and exercise encouraged.   Simple chronic bronchitis ([place]) Will initiate below along with PRN SABA. Report any new, worsening, or persistent symptoms.  -     Budesonide (PULMICORT FLEXHALER) 90 MCG/ACT inhaler; Inhale 1 puff into the lungs daily.  Need for immunization against influenza -     Flu Vaccine QUAD High Dose(Fluad)     Continue all other maintenance medications.  Follow up plan: Return in about 3 months (around 06/11/2019), or if symptoms worsen or fail to improve, for DM.  Continue healthy lifestyle choices, including diet (rich in fruits, vegetables, and lean proteins, and low in salt and simple carbohydrates) and exercise (at least 30 minutes of moderate physical activity daily).  Educational handout given for DM  The above assessment and management plan was discussed with the patient. The patient verbalized understanding of and has agreed to the management plan. Patient is aware to call the clinic if they develop any new symptoms or if symptoms persist or worsen. Patient is aware when to return to the clinic for a follow-up visit. Patient educated on when it is appropriate to go to the emergency department.   Monia Pouch, FNP-C [facility] [phone]

## 2019-03-13 NOTE — Patient Instructions (Addendum)
Continue to monitor your blood sugars as we discussed and record them. Bring the log to your next appointment.  Take your medications as directed.    Goal Blood glucose:    Fasting (before meals) = 80 to 130   Within 2 hours of eating = less than 180   Understanding your Hemoglobin A1c:     Diabetes Mellitus and Nutrition    I think that you would greatly benefit from seeing a nutritionist. If this is something you are interested in, please call Dr Sykes at [phone] to schedule an appointment.   When you have diabetes (diabetes mellitus), it is very important to have healthy eating habits because your blood sugar (glucose) levels are greatly affected by what you eat and drink. Eating healthy foods in the appropriate amounts, at about the same times every day, can help you:  Control your blood glucose.  Lower your risk of heart disease.  Improve your blood pressure.  Reach or maintain a healthy weight.  Every person with diabetes is different, and each person has different needs for a meal plan. Your health care provider may recommend that you work with a diet and nutrition specialist (dietitian) to make a meal plan that is best for you. Your meal plan may vary depending on factors such as:  The calories you need.  The medicines you take.  Your weight.  Your blood glucose, blood pressure, and cholesterol levels.  Your activity level.  Other health conditions you have, such as heart or kidney disease.  How do carbohydrates affect me? Carbohydrates affect your blood glucose level more than any other type of food. Eating carbohydrates naturally increases the amount of glucose in your blood. Carbohydrate counting is a method for keeping track of how many carbohydrates you eat. Counting carbohydrates is important to keep your blood glucose at a healthy level, especially if you use insulin or take certain oral diabetes medicines. It is important to know how many  carbohydrates you can safely have in each meal. This is different for every person. Your dietitian can help you calculate how many carbohydrates you should have at each meal and for snack. Foods that contain carbohydrates include:  Bread, cereal, rice, pasta, and crackers.  Potatoes and corn.  Peas, beans, and lentils.  Milk and yogurt.  Fruit and juice.  Desserts, such as cakes, cookies, ice cream, and candy.  How does alcohol affect me? Alcohol can cause a sudden decrease in blood glucose (hypoglycemia), especially if you use insulin or take certain oral diabetes medicines. Hypoglycemia can be a life-threatening condition. Symptoms of hypoglycemia (sleepiness, dizziness, and confusion) are similar to symptoms of having too much alcohol. If your health care provider says that alcohol is safe for you, follow these guidelines:  Limit alcohol intake to no more than 1 drink per day for nonpregnant women and 2 drinks per day for men. One drink equals 12 oz of beer, 5 oz of wine, or 1 oz of hard liquor.  Do not drink on an empty stomach.  Keep yourself hydrated with water, diet soda, or unsweetened iced tea.  Keep in mind that regular soda, juice, and other mixers may contain a lot of sugar and must be counted as carbohydrates.  What are tips for following this plan?  Reading food labels  Start by checking the serving size on the label. The amount of calories, carbohydrates, fats, and other nutrients listed on the label are based on one serving of the food. Many foods   contain more than one serving per package.  Check the total grams (g) of carbohydrates in one serving. You can calculate the number of servings of carbohydrates in one serving by dividing the total carbohydrates by 15. For example, if a food has 30 g of total carbohydrates, it would be equal to 2 servings of carbohydrates.  Check the number of grams (g) of saturated and trans fats in one serving. Choose foods that have  low or no amount of these fats.  Check the number of milligrams (mg) of sodium in one serving. Most people should limit total sodium intake to less than 2,300 mg per day.  Always check the nutrition information of foods labeled as "low-fat" or "nonfat". These foods may be higher in added sugar or refined carbohydrates and should be avoided.  Talk to your dietitian to identify your daily goals for nutrients listed on the label.  Shopping  Avoid buying canned, premade, or processed foods. These foods tend to be high in fat, sodium, and added sugar.  Shop around the outside edge of the grocery store. This includes fresh fruits and vegetables, bulk grains, fresh meats, and fresh dairy.  Cooking  Use low-heat cooking methods, such as baking, instead of high-heat cooking methods like deep frying.  Cook using healthy oils, such as olive, canola, or sunflower oil.  Avoid cooking with butter, cream, or high-fat meats.  Meal planning  Eat meals and snacks regularly, preferably at the same times every day. Avoid going long periods of time without eating.  Eat foods high in fiber, such as fresh fruits, vegetables, beans, and whole grains. Talk to your dietitian about how many servings of carbohydrates you can eat at each meal.  Eat 4-6 ounces of lean protein each day, such as lean meat, chicken, fish, eggs, or tofu. 1 ounce is equal to 1 ounce of meat, chicken, or fish, 1 egg, or 1/4 cup of tofu.  Eat some foods each day that contain healthy fats, such as avocado, nuts, seeds, and fish.  Lifestyle   Check your blood glucose regularly.  Exercise at least 30 minutes 5 or more days each week, or as told by your health care provider.  Take medicines as told by your health care provider.  Do not use any products that contain nicotine or tobacco, such as cigarettes and e-cigarettes. If you need help quitting, ask your health care provider.  Work with a counselor or diabetes educator to  identify strategies to manage stress and any emotional and social challenges.   What are some questions to ask my health care provider?  Do I need to meet with a diabetes educator?  Do I need to meet with a dietitian?  What number can I call if I have questions?  When are the best times to check my blood glucose?   Where to find more information:  American Diabetes Association: diabetes.org/food-and-fitness/food  Academy of Nutrition and Dietetics: www.eatright.org/resources/health/diseases-and-conditions/diabetes  National Institute of Diabetes and Digestive and Kidney Diseases (NIH): www.niddk.nih.gov/health-information/diabetes/overview/diet-eating-physical-activity   Summary  A healthy meal plan will help you control your blood glucose and maintain a healthy lifestyle.  Working with a diet and nutrition specialist (dietitian) can help you make a meal plan that is best for you.  Keep in mind that carbohydrates and alcohol have immediate effects on your blood glucose levels. It is important to count carbohydrates and to use alcohol carefully. This information is not intended to replace advice given to you by your health care provider.   Make sure you discuss any questions you have with your health care provider. Document Released: 11/18/2004 Document Revised: 03/28/2016 Document Reviewed: 03/28/2016 Elsevier Interactive Patient Education  2018 [employer].     Asthma, Adult  Asthma is a long-term (chronic) condition in which the airways get tight and narrow. The airways are the breathing passages that lead from the nose and mouth down into the lungs. A person with asthma will have times when symptoms get worse. These are called asthma attacks. They can cause coughing, whistling sounds when you breathe (wheezing), shortness of breath, and chest pain. They can make it hard to breathe. There is no cure for asthma, but medicines and lifestyle changes can help control it. There  are many things that can bring on an asthma attack or make asthma symptoms worse (triggers). Common triggers include:  Mold.  Dust.  Cigarette smoke.  Cockroaches.  Things that can cause allergy symptoms (allergens). These include animal skin flakes (dander) and pollen from trees or grass.  Things that pollute the air. These may include household cleaners, wood smoke, smog, or chemical odors.  Cold air, weather changes, and wind.  Crying or laughing hard.  Stress.  Certain medicines or drugs.  Certain foods such as dried fruit, potato chips, and grape juice.  Infections, such as a cold or the flu.  Certain medical conditions or diseases.  Exercise or tiring activities. Asthma may be treated with medicines and by staying away from the things that cause asthma attacks. Types of medicines may include:  Controller medicines. These help prevent asthma symptoms. They are usually taken every day.  Fast-acting reliever or rescue medicines. These quickly relieve asthma symptoms. They are used as needed and provide short-term relief.  Allergy medicines if your attacks are brought on by allergens.  Medicines to help control the body's defense (immune) system. Follow these instructions at home: Avoiding triggers in your home  Change your heating and air conditioning filter often.  Limit your use of fireplaces and wood stoves.  Get rid of pests (such as roaches and mice) and their droppings.  Throw away plants if you see mold on them.  Clean your floors. Dust regularly. Use cleaning products that do not smell.  Have someone vacuum when you are not home. Use a vacuum cleaner with a HEPA filter if possible.  Replace carpet with wood, tile, or vinyl flooring. Carpet can trap animal skin flakes and dust.  Use allergy-proof pillows, mattress covers, and box spring covers.  Wash bed sheets and blankets every week in hot water. Dry them in a dryer.  Keep your bedroom free of  any triggers.  Avoid pets and keep windows closed when things that cause allergy symptoms are in the air.  Use blankets that are made of polyester or cotton.  Clean bathrooms and kitchens with bleach. If possible, have someone repaint the walls in these rooms with mold-resistant paint. Keep out of the rooms that are being cleaned and painted.  Wash your hands often with soap and water. If soap and water are not available, use hand sanitizer.  Do not allow anyone to smoke in your home. General instructions  Take over-the-counter and prescription medicines only as told by your doctor. ? Talk with your doctor if you have questions about how or when to take your medicines. ? Make note if you need to use your medicines more often than usual.  Do not use any products that contain nicotine or tobacco, such as cigarettes and e-cigarettes.  If you need help quitting, ask your doctor.  Stay away from secondhand smoke.  Avoid doing things outdoors when allergen counts are high and when air quality is low.  Wear a ski mask when doing outdoor activities in the winter. The mask should cover your nose and mouth. Exercise indoors on cold days if you can.  Warm up before you exercise. Take time to cool down after exercise.  Use a peak flow meter as told by your doctor. A peak flow meter is a tool that measures how well the lungs are working.  Keep track of the peak flow meter's readings. Write them down.  Follow your asthma action plan. This is a written plan for taking care of your asthma and treating your attacks.  Make sure you get all the shots (vaccines) that your doctor recommends. Ask your doctor about a flu shot and a pneumonia shot.  Keep all follow-up visits as told by your doctor. This is important. Contact a doctor if:  You have wheezing, shortness of breath, or a cough even while taking medicine to prevent attacks.  The mucus you cough up (sputum) is thicker than usual.  The  mucus you cough up changes from clear or white to yellow, green, gray, or bloody.  You have problems from the medicine you are taking, such as: ? A rash. ? Itching. ? Swelling. ? Trouble breathing.  You need reliever medicines more than 2-3 times a week.  Your peak flow reading is still at 50-79% of your personal best after following the action plan for 1 hour.  You have a fever. Get help right away if:  You seem to be worse and are not responding to medicine during an asthma attack.  You are short of breath even at rest.  You get short of breath when doing very little activity.  You have trouble eating, drinking, or talking.  You have chest pain or tightness.  You have a fast heartbeat.  Your lips or fingernails start to turn blue.  You are light-headed or dizzy, or you faint.  Your peak flow is less than 50% of your personal best.  You feel too tired to breathe normally. Summary  Asthma is a long-term (chronic) condition in which the airways get tight and narrow. An asthma attack can make it hard to breathe.  Asthma cannot be cured, but medicines and lifestyle changes can help control it.  Make sure you understand how to avoid triggers and how and when to use your medicines. This information is not intended to replace advice given to you by your health care provider. Make sure you discuss any questions you have with your health care provider. Document Revised: 04/26/2018 Document Reviewed: 03/28/2016 Elsevier Patient Education  2020 [employer].

## 2019-03-14 ENCOUNTER — Encounter: Payer: Self-pay | Admitting: Family Medicine

## 2019-03-14 DIAGNOSIS — E1122 Type 2 diabetes mellitus with diabetic chronic kidney disease: Secondary | ICD-10-CM | POA: Insufficient documentation

## 2019-03-14 LAB — CBC WITH DIFFERENTIAL/PLATELET
Basophils Absolute: 0.1 10*3/uL (ref 0.0–0.2)
Basos: 1 %
EOS (ABSOLUTE): 0.9 10*3/uL — ABNORMAL HIGH (ref 0.0–0.4)
Eos: 10 %
Hematocrit: 40 % (ref 34.0–46.6)
Hemoglobin: 13.2 g/dL (ref 11.1–15.9)
Immature Grans (Abs): 0 10*3/uL (ref 0.0–0.1)
Immature Granulocytes: 0 %
Lymphocytes Absolute: 2.7 10*3/uL (ref 0.7–3.1)
Lymphs: 30 %
MCH: 27 pg (ref 26.6–33.0)
MCHC: 33 g/dL (ref 31.5–35.7)
MCV: 82 fL (ref 79–97)
Monocytes Absolute: 0.5 10*3/uL (ref 0.1–0.9)
Monocytes: 6 %
Neutrophils Absolute: 4.8 10*3/uL (ref 1.4–7.0)
Neutrophils: 53 %
Platelets: 400 10*3/uL (ref 150–450)
RBC: 4.88 x10E6/uL (ref 3.77–5.28)
RDW: 14.1 % (ref 11.7–15.4)
WBC: 9 10*3/uL (ref 3.4–10.8)

## 2019-03-14 LAB — LIPID PANEL
Chol/HDL Ratio: 4.6 ratio — ABNORMAL HIGH (ref 0.0–4.4)
Cholesterol, Total: 189 mg/dL (ref 100–199)
HDL: 41 mg/dL (ref >39)
LDL Chol Calc (NIH): 104 mg/dL — ABNORMAL HIGH (ref 0–99)
Triglycerides: 258 mg/dL — ABNORMAL HIGH (ref 0–149)
VLDL Cholesterol Cal: 44 mg/dL — ABNORMAL HIGH (ref 5–40)

## 2019-03-14 LAB — THYROID PANEL WITH TSH
Free Thyroxine Index: 2.4 (ref 1.2–4.9)
T3 Uptake Ratio: 26 % (ref 24–39)
T4, Total: 9.1 ug/dL (ref 4.5–12.0)
TSH: 1.88 u[IU]/mL (ref 0.450–4.500)

## 2019-03-14 LAB — CMP14+EGFR
ALT: 19 IU/L (ref 0–32)
AST: 17 IU/L (ref 0–40)
Albumin/Globulin Ratio: 1.3 (ref 1.2–2.2)
Albumin: 4 g/dL (ref 3.8–4.8)
Alkaline Phosphatase: 86 IU/L (ref 39–117)
BUN/Creatinine Ratio: 15 (ref 12–28)
BUN: 20 mg/dL (ref 8–27)
Bilirubin Total: 0.4 mg/dL (ref 0.0–1.2)
CO2: 23 mmol/L (ref 20–29)
Calcium: 9.7 mg/dL (ref 8.7–10.3)
Chloride: 101 mmol/L (ref 96–106)
Creatinine, Ser: 1.37 mg/dL — ABNORMAL HIGH (ref 0.57–1.00)
GFR calc Af Amer: 47 mL/min/{1.73_m2} — ABNORMAL LOW (ref >59)
GFR calc non Af Amer: 41 mL/min/{1.73_m2} — ABNORMAL LOW (ref >59)
Globulin, Total: 3.1 g/dL (ref 1.5–4.5)
Glucose: 190 mg/dL — ABNORMAL HIGH (ref 65–99)
Potassium: 5.2 mmol/L (ref 3.5–5.2)
Sodium: 139 mmol/L (ref 134–144)
Total Protein: 7.1 g/dL (ref 6.0–8.5)

## 2019-03-14 LAB — MICROALBUMIN / CREATININE URINE RATIO
Creatinine, Urine: 118.4 mg/dL
Microalb/Creat Ratio: 33 mg/g creat — ABNORMAL HIGH (ref 0–29)
Microalbumin, Urine: 38.7 ug/mL

## 2019-03-27 ENCOUNTER — Telehealth: Payer: Self-pay | Admitting: *Deleted

## 2019-03-27 ENCOUNTER — Other Ambulatory Visit: Payer: Self-pay | Admitting: Family Medicine

## 2019-03-27 DIAGNOSIS — J410 Simple chronic bronchitis: Secondary | ICD-10-CM

## 2019-03-27 MED ORDER — ARNUITY ELLIPTA 100 MCG/ACT IN AEPB: 1.0000 | INHALATION_SPRAY | Freq: Two times a day (BID) | RESPIRATORY_TRACT | 11 refills | Status: DC

## 2019-03-27 NOTE — Telephone Encounter (Signed)
Pulmicort 90 mcg Inh-Non-Formulary on pt's insurance  Alternative is Arnuity Prob[employer] officer for inhalation

## 2019-03-27 NOTE — Telephone Encounter (Signed)
New RX sent

## 2019-04-01 NOTE — Telephone Encounter (Signed)
Aware. 

## 2019-04-23 ENCOUNTER — Telehealth: Payer: Self-pay | Admitting: Family Medicine

## 2019-04-23 NOTE — Telephone Encounter (Signed)
Tried to call patient, received voicemail, voicemail full

## 2019-04-24 ENCOUNTER — Ambulatory Visit (INDEPENDENT_AMBULATORY_CARE_PROVIDER_SITE_OTHER): Payer: Medicare HMO | Admitting: Family Medicine

## 2019-04-24 ENCOUNTER — Other Ambulatory Visit: Payer: Self-pay | Admitting: *Deleted

## 2019-04-24 DIAGNOSIS — J410 Simple chronic bronchitis: Secondary | ICD-10-CM | POA: Diagnosis not present

## 2019-04-24 MED ORDER — ARNUITY ELLIPTA 100 MCG/ACT IN AEPB: 1.0000 | INHALATION_SPRAY | Freq: Two times a day (BID) | RESPIRATORY_TRACT | 11 refills | Status: DC

## 2019-04-24 NOTE — Telephone Encounter (Signed)
No answer, voicemail full 

## 2019-04-24 NOTE — Progress Notes (Signed)
Telephone visit  Subjective: CC: congestion PCP: Baruch Gouty, FNP [phone] Brooke [name] is a 64 y.o. female calls for telephone consult today. Patient provides verbal consent for consult held via phone.  Due to COVID-19 pandemic this visit was conducted virtually. This visit type was conducted due to national recommendations for restrictions regarding the COVID-19 Pandemic (e.g. social distancing, sheltering in place) in an effort to limit this patient's exposure and mitigate transmission in our community. All issues noted in this document were discussed and addressed.  A physical exam was not performed with this format.   Location of patient: home Location of provider: [facility] Others present for call: none  1. Congestion Patient reports onset of am congestion that is moderate-severe that has been chronic for her.  She reports history of bronchitis.  No known h/o COPD, Asthma.  She is exposed to second hand smoke but has never been a regular smoker herself.  She socially smoked as a young lady >30 years ago.  She reports phlegm is clear and sometimes light yellow.  No hemoptysis, shortness of breath, wheezing.  She uses Pulmicort flexhaler now, which was prescribed recently.  She notes this is written as 1 puff once daily.  She has been trying to use this for the last several days but unfortunately is not feel like she is getting the inhaler to work properly.  The counter is not counting down as she would expect.   ROS: Per HPI  No Known Allergies Past Medical History:  Diagnosis Date  . Bronchitis    frequent bronchitis  . Diabetes mellitus   . Hypertension   . Obesity (BMI 35.0-39.9 without comorbidity)   . Vertigo     Current Outpatient Medications:  .  Budesonide 90 MCG/ACT inhaler, Inhale 1 puff into the lungs 2 (two) times daily., Disp: , Rfl:  .  albuterol (PROVENTIL HFA;VENTOLIN HFA) 108 (90 Base) MCG/ACT inhaler, Inhale 1-2 puffs into the lungs every 6 (six) hours as needed  for wheezing or shortness of breath., Disp: 1 Inhaler, Rfl: 0 .  amLODipine (NORVASC) 10 MG tablet, Take 10 mg by mouth daily., Disp: , Rfl:  .  atenolol (TENORMIN) 100 MG tablet, Take 100 mg by mouth every morning. , Disp: , Rfl:  .  Dulaglutide (TRULICITY) 3 0000000 SOPN, Inject 3 mg into the skin once a week. Inject 3 mg into skin SQ once weekly., Disp: 1 pen, Rfl: 11 .  glipiZIDE (GLUCOTROL) 10 MG tablet, Take 20 mg by mouth 2 (two) times daily before a meal. , Disp: , Rfl:  .  JANUVIA 50 MG tablet, , Disp: , Rfl: 1 .  lisinopril-hydrochlorothiazide (PRINZIDE,ZESTORETIC) 20-25 MG per tablet, Take 1 tablet by mouth daily., Disp: , Rfl:  .  lovastatin (MEVACOR) 20 MG tablet, , Disp: , Rfl: 6  Assessment/ Plan: 64 y.o. female   1. Simple chronic bronchitis (HCC) Undiagnosed lung disease.  Would consider at some point sending her for pulmonary function tests.  I have updated her med list to reflect current inhaler.  Apparently the Arnuity was not covered by insurance.  I think that this inhaler should be used twice daily and have instructed the patient to do such.  It sounds like the inhaler is not delivering the medication as we try to do this over the telephone and we were unsuccessful.  She will come into the office to have this evaluated further and to get a demonstration of how the inhaler should work.  I do not think  that she has any infectious symptoms at this time.   Start time: 9:21am End time: 9:38am  Total time spent on patient care (including telephone call/ virtual visit): 20 minutes  [place], [place] [phone]

## 2019-04-26 NOTE — Telephone Encounter (Signed)
Unable to reach patient, no answer and voicemail full.  Encounter closed.

## 2019-05-07 ENCOUNTER — Ambulatory Visit (INDEPENDENT_AMBULATORY_CARE_PROVIDER_SITE_OTHER): Payer: Medicare HMO | Admitting: Family Medicine

## 2019-05-07 ENCOUNTER — Encounter: Payer: Self-pay | Admitting: Family Medicine

## 2019-05-07 ENCOUNTER — Telehealth: Payer: Self-pay | Admitting: Family Medicine

## 2019-05-07 DIAGNOSIS — J329 Chronic sinusitis, unspecified: Secondary | ICD-10-CM | POA: Diagnosis not present

## 2019-05-07 DIAGNOSIS — J40 Bronchitis, not specified as acute or chronic: Secondary | ICD-10-CM

## 2019-05-07 MED ORDER — AMLODIPINE BESYLATE 10 MG PO TABS: 10.0000 mg | ORAL_TABLET | Freq: Every day | ORAL | 0 refills | Status: DC

## 2019-05-07 MED ORDER — PREDNISONE 10 MG PO TABS: ORAL_TABLET | ORAL | 0 refills | Status: DC

## 2019-05-07 MED ORDER — LOVASTATIN 20 MG PO TABS: 20.0000 mg | ORAL_TABLET | Freq: Every day | ORAL | 0 refills | Status: DC

## 2019-05-07 MED ORDER — AMOXICILLIN-POT CLAVULANATE 875-125 MG PO TABS: 1.0000 | ORAL_TABLET | Freq: Two times a day (BID) | ORAL | 0 refills | Status: DC

## 2019-05-07 NOTE — Telephone Encounter (Signed)
Medications refilled

## 2019-05-07 NOTE — Telephone Encounter (Signed)
Patient aware, per message left on her voice mail,  scripts are ready.

## 2019-05-07 NOTE — Telephone Encounter (Signed)
Patient was seen and had blood work on January 6,2021.  Her creatinine was elevated.  Amlodipine and mevacor have no refill done by pcp.  Please review and advise on request for medication or send to pcp.

## 2019-05-07 NOTE — Progress Notes (Signed)
Subjective:    Patient ID: Brooke [name], female    DOB: December 31, 1955, 64 y.o.   MRN: [id number]   HPI: Brooke [name] is a 64 y.o. female presenting for Symptoms include congestion, facial pain, nasal congestion, with  productive cough. No post nasal drip and sinus pressure. There is no fever, chills, or sweats.  Onset of symptoms was a few months ago, gradually worsening. Coughing up drainage in the morning.    Depression screen [facility] 2/9 03/13/2019 01/15/2014  Decreased Interest 0 0  Down, Depressed, Hopeless 0 0  PHQ - 2 Score 0 0     Relevant past medical, surgical, family and social history reviewed and updated as indicated.  Interim medical history since our last visit reviewed. Allergies and medications reviewed and updated.  ROS:  Review of Systems  Constitutional: Negative for appetite change, chills, diaphoresis, fatigue and fever.  HENT: Positive for congestion, sinus pressure and sinus pain. Negative for ear pain, hearing loss, postnasal drip, rhinorrhea, sore throat and trouble swallowing.   Respiratory: Positive for cough. Negative for chest tightness and shortness of breath.   Cardiovascular: Negative for chest pain and palpitations.  Gastrointestinal: Negative for abdominal pain.  Musculoskeletal: Negative for arthralgias.  Skin: Negative for rash.     Social History   Tobacco Use  Smoking Status Never Smoker  Smokeless Tobacco Never Used       Objective:     Wt Readings from Last 3 Encounters:  03/13/19 202 lb (91.6 kg)  05/03/18 197 lb 9.6 oz (89.6 kg)  04/28/18 201 lb (91.2 kg)     Exam deferred. Pt. Harboring due to COVID 19. Phone visit performed.   Assessment & Plan:   1. Sinobronchitis     Meds ordered this encounter  Medications  . amoxicillin-clavulanate (AUGMENTIN) 875-125 MG tablet    Sig: Take 1 tablet by mouth 2 (two) times daily. Take all of this medication    Dispense:  20 tablet    Refill:  0  . predniSONE (DELTASONE)  10 MG tablet    Sig: Take 5 daily for 2 days followed by 4,3,2 and 1 for 2 days each.    Dispense:  30 tablet    Refill:  0    No orders of the defined types were placed in this encounter.     Diagnoses and all orders for this visit:  Sinobronchitis  Other orders -     amoxicillin-clavulanate (AUGMENTIN) 875-125 MG tablet; Take 1 tablet by mouth 2 (two) times daily. Take all of this medication -     predniSONE (DELTASONE) 10 MG tablet; Take 5 daily for 2 days followed by 4,3,2 and 1 for 2 days each.    Virtual Visit via telephone Note  I discussed the limitations, risks, security and privacy concerns of performing an evaluation and management service by telephone and the availability of in person appointments. The patient was identified with two identifiers. Pt.expressed understanding and agreed to proceed. Pt. Is at home. Dr. Livia Snellen is in his office.  Follow Up Instructions:   I discussed the assessment and treatment plan with the patient. The patient was provided an opportunity to ask questions and all were answered. The patient agreed with the plan and demonstrated an understanding of the instructions.   The patient was advised to call back or seek an in-person evaluation if the symptoms worsen or if the condition fails to improve as anticipated.   Total minutes including chart review and phone contact time:  7   Follow up plan: Return if symptoms worsen or fail to improve.  Claretta Fraise, MD [place]

## 2019-05-07 NOTE — Telephone Encounter (Signed)
°  Medication Request  05/07/2019  What is the name of the medication? lovastatin (MEVACOR) 20 MG tablet amLODipine (NORVASC) 10 MG tablet    Have you contacted your pharmacy to request a refill? yes  Which pharmacy would you like this sent to? [facility] [place]   Patient notified that their request is being sent to the clinical staff for review and that they should receive a call once it is complete. If they do not receive a call within 24 hours they can check with their pharmacy or our office.

## 2019-06-12 ENCOUNTER — Ambulatory Visit: Payer: Medicare HMO | Admitting: Family Medicine

## 2019-06-26 ENCOUNTER — Ambulatory Visit (INDEPENDENT_AMBULATORY_CARE_PROVIDER_SITE_OTHER): Payer: Medicare HMO | Admitting: Family Medicine

## 2019-06-26 ENCOUNTER — Other Ambulatory Visit: Payer: Self-pay

## 2019-06-26 ENCOUNTER — Encounter: Payer: Self-pay | Admitting: Family Medicine

## 2019-06-26 DIAGNOSIS — J410 Simple chronic bronchitis: Secondary | ICD-10-CM | POA: Diagnosis not present

## 2019-06-26 DIAGNOSIS — E119 Type 2 diabetes mellitus without complications: Secondary | ICD-10-CM

## 2019-06-26 DIAGNOSIS — N183 Chronic kidney disease, stage 3 unspecified: Secondary | ICD-10-CM | POA: Diagnosis not present

## 2019-06-26 DIAGNOSIS — E1122 Type 2 diabetes mellitus with diabetic chronic kidney disease: Secondary | ICD-10-CM

## 2019-06-26 MED ORDER — PREDNISONE 10 MG (21) PO TBPK: ORAL_TABLET | ORAL | 0 refills | Status: DC

## 2019-06-26 MED ORDER — ALBUTEROL SULFATE HFA 108 (90 BASE) MCG/ACT IN AERS: 2.0000 | INHALATION_SPRAY | Freq: Four times a day (QID) | RESPIRATORY_TRACT | 2 refills | Status: DC | PRN

## 2019-06-26 MED ORDER — BUDESONIDE-FORMOTEROL FUMARATE 160-4.5 MCG/ACT IN AERO: 2.0000 | INHALATION_SPRAY | Freq: Two times a day (BID) | RESPIRATORY_TRACT | 2 refills | Status: DC

## 2019-06-26 NOTE — Progress Notes (Signed)
Virtual Visit via Telephone Note  I connected with Brooke [name] on 06/26/19 at 5:35 PM by telephone and verified that I am speaking with the correct person using two identifiers. Nesha Counihan is currently located at home and nobody is currently with her during this visit. The provider, [name], FNP is located in their office at time of visit.  I discussed the limitations, risks, security and privacy concerns of performing an evaluation and management service by telephone and the availability of in person appointments. I also discussed with the patient that there may be a patient responsible charge related to this service. The patient expressed understanding and agreed to proceed.  Subjective: PCP: [name], FNP  Chief Complaint  Patient presents with  . Diabetes   Patient complains of chest congestion and wheezing. Patient denies cough, head congestion, headache, runny nose, sneezing, sore throat, ear pain/pressure, facial pain/pressure, fever and shortness of breath. She is drinking plenty of fluids. Evaluation to date: none. Treatment to date: none. She has a history of chronic bronchitis. She does not smoke.    ROS: Per HPI  Current Outpatient Medications:  .  albuterol (PROVENTIL HFA;VENTOLIN HFA) 108 (90 Base) MCG/ACT inhaler, Inhale 1-2 puffs into the lungs every 6 (six) hours as needed for wheezing or shortness of breath., Disp: 1 Inhaler, Rfl: 0 .  amLODipine (NORVASC) 10 MG tablet, Take 1 tablet (10 mg total) by mouth daily., Disp: 90 tablet, Rfl: 0 .  amoxicillin-clavulanate (AUGMENTIN) 875-125 MG tablet, Take 1 tablet by mouth 2 (two) times daily. Take all of this medication, Disp: 20 tablet, Rfl: 0 .  atenolol (TENORMIN) 100 MG tablet, Take 100 mg by mouth every morning. , Disp: , Rfl:  .  Budesonide 90 MCG/ACT inhaler, Inhale 1 puff into the lungs 2 (two) times daily., Disp: , Rfl:  .  Dulaglutide (TRULICITY) 3 FO/2.7XA SOPN, Inject 3 mg into the skin  once a week. Inject 3 mg into skin SQ once weekly., Disp: 1 pen, Rfl: 11 .  Fluticasone Furoate (ARNUITY ELLIPTA) 100 MCG/ACT AEPB, Inhale 1 puff into the lungs 2 (two) times daily., Disp: 30 each, Rfl: 11 .  glipiZIDE (GLUCOTROL) 10 MG tablet, Take 20 mg by mouth 2 (two) times daily before a meal. , Disp: , Rfl:  .  JANUVIA 50 MG tablet, , Disp: , Rfl: 1 .  lisinopril-hydrochlorothiazide (PRINZIDE,ZESTORETIC) 20-25 MG per tablet, Take 1 tablet by mouth daily., Disp: , Rfl:  .  lovastatin (MEVACOR) 20 MG tablet, Take 1 tablet (20 mg total) by mouth daily at 6 PM., Disp: 90 tablet, Rfl: 0 .  predniSONE (DELTASONE) 10 MG tablet, Take 5 daily for 2 days followed by 4,3,2 and 1 for 2 days each., Disp: 30 tablet, Rfl: 0  No Known Allergies Past Medical History:  Diagnosis Date  . Bronchitis    frequent bronchitis  . Diabetes mellitus   . Hypertension   . Obesity (BMI 35.0-39.9 without comorbidity)   . Vertigo     Observations/Objective: A&O  No respiratory distress or wheezing audible over the phone Mood, judgement, and thought processes all WNL   Assessment and Plan: 1. Type 2 diabetes mellitus without complication, without long-term current use of insulin (HCC) Lab Results  Component Value Date   HGBA1C 10.4 (H) 03/13/2019  Patient will come for A1c when her respiratory symptoms have resolved. - Patient is currently taking a statin. Patient is taking an ACE-inhibitor/ARB.  - Last foot exam: 03/13/2019 - Urine Microalbumin/Creat  Ratio: 03/13/2019 - CMP14+EGFR; Future - Bayer DCA Hb A1c Waived; Future  2. Simple chronic bronchitis (HCC) - budesonide-formoterol (SYMBICORT) 160-4.5 MCG/ACT inhaler; Inhale 2 puffs into the lungs 2 (two) times daily.  Dispense: 1 Inhaler; Refill: 2 - predniSONE (STERAPRED UNI-PAK 21 TAB) 10 MG (21) TBPK tablet; As directed x 6 days  Dispense: 21 tablet; Refill: 0 - albuterol (VENTOLIN HFA) 108 (90 Base) MCG/ACT inhaler; Inhale 2 puffs into the lungs every  6 (six) hours as needed for wheezing or shortness of breath.  Dispense: 18 g; Refill: 2  3. CKD stage 3 due to type 2 diabetes mellitus ([place]) - CMP14+EGFR; Future   Follow Up Instructions: I discussed the assessment and treatment plan with the patient. The patient was provided an opportunity to ask questions and all were answered. The patient agreed with the plan and demonstrated an understanding of the instructions.   The patient was advised to call back or seek an in-person evaluation if the symptoms worsen or if the condition fails to improve as anticipated.  The above assessment and management plan was discussed with the patient. The patient verbalized understanding of and has agreed to the management plan. Patient is aware to call the clinic if symptoms persist or worsen. Patient is aware when to return to the clinic for a follow-up visit. Patient educated on when it is appropriate to go to the emergency department.   Time call ended: 5:53 PM  I provided 20 minutes of non-face-to-face time during this encounter.  [name], MSN, APRN, FNP-C [facility] 06/26/19

## 2019-07-31 ENCOUNTER — Other Ambulatory Visit: Payer: Medicare HMO

## 2019-07-31 ENCOUNTER — Other Ambulatory Visit: Payer: Self-pay | Admitting: *Deleted

## 2019-07-31 ENCOUNTER — Other Ambulatory Visit: Payer: Self-pay

## 2019-07-31 DIAGNOSIS — E669 Obesity, unspecified: Secondary | ICD-10-CM

## 2019-07-31 DIAGNOSIS — N183 Chronic kidney disease, stage 3 unspecified: Secondary | ICD-10-CM | POA: Diagnosis not present

## 2019-07-31 DIAGNOSIS — E1122 Type 2 diabetes mellitus with diabetic chronic kidney disease: Secondary | ICD-10-CM

## 2019-07-31 DIAGNOSIS — E119 Type 2 diabetes mellitus without complications: Secondary | ICD-10-CM

## 2019-07-31 LAB — BAYER DCA HB A1C WAIVED: HB A1C (BAYER DCA - WAIVED): 10.8 % — ABNORMAL HIGH (ref <7.0)

## 2019-08-01 ENCOUNTER — Encounter: Payer: Self-pay | Admitting: Family Medicine

## 2019-08-01 ENCOUNTER — Ambulatory Visit (INDEPENDENT_AMBULATORY_CARE_PROVIDER_SITE_OTHER): Payer: Medicare HMO | Admitting: Family Medicine

## 2019-08-01 DIAGNOSIS — E119 Type 2 diabetes mellitus without complications: Secondary | ICD-10-CM

## 2019-08-01 LAB — CMP14+EGFR
ALT: 20 IU/L (ref 0–32)
AST: 18 IU/L (ref 0–40)
Albumin/Globulin Ratio: 1.7 (ref 1.2–2.2)
Albumin: 4 g/dL (ref 3.8–4.8)
Alkaline Phosphatase: 83 IU/L (ref 48–121)
BUN/Creatinine Ratio: 11 — ABNORMAL LOW (ref 12–28)
BUN: 14 mg/dL (ref 8–27)
Bilirubin Total: 0.2 mg/dL (ref 0.0–1.2)
CO2: 22 mmol/L (ref 20–29)
Calcium: 9.5 mg/dL (ref 8.7–10.3)
Chloride: 103 mmol/L (ref 96–106)
Creatinine, Ser: 1.24 mg/dL — ABNORMAL HIGH (ref 0.57–1.00)
GFR calc Af Amer: 53 mL/min/{1.73_m2} — ABNORMAL LOW (ref >59)
GFR calc non Af Amer: 46 mL/min/{1.73_m2} — ABNORMAL LOW (ref >59)
Globulin, Total: 2.4 g/dL (ref 1.5–4.5)
Glucose: 184 mg/dL — ABNORMAL HIGH (ref 65–99)
Potassium: 4.1 mmol/L (ref 3.5–5.2)
Sodium: 139 mmol/L (ref 134–144)
Total Protein: 6.4 g/dL (ref 6.0–8.5)

## 2019-08-01 MED ORDER — ATENOLOL 100 MG PO TABS: 100.0000 mg | ORAL_TABLET | Freq: Every morning | ORAL | 1 refills | Status: DC

## 2019-08-01 MED ORDER — GLIPIZIDE 10 MG PO TABS: 20.0000 mg | ORAL_TABLET | Freq: Two times a day (BID) | ORAL | 1 refills | Status: DC

## 2019-08-01 MED ORDER — AMLODIPINE BESYLATE 10 MG PO TABS: 10.0000 mg | ORAL_TABLET | Freq: Every day | ORAL | 1 refills | Status: DC

## 2019-08-01 MED ORDER — TRULICITY 4.5 MG/0.5ML ~~LOC~~ SOAJ: 4.5000 mg | SUBCUTANEOUS | 1 refills | Status: DC

## 2019-08-01 MED ORDER — LISINOPRIL-HYDROCHLOROTHIAZIDE 20-25 MG PO TABS: 1.0000 | ORAL_TABLET | Freq: Every day | ORAL | 1 refills | Status: DC

## 2019-08-01 MED ORDER — LOVASTATIN 20 MG PO TABS: 20.0000 mg | ORAL_TABLET | Freq: Every day | ORAL | 1 refills | Status: DC

## 2019-08-01 MED ORDER — JANUVIA 50 MG PO TABS: 50.0000 mg | ORAL_TABLET | Freq: Every day | ORAL | 1 refills | Status: DC

## 2019-08-01 NOTE — Progress Notes (Signed)
Virtual Visit via Telephone Note  I connected with Brooke [name] on 08/01/19 at 10:03 AM by telephone and verified that I am speaking with the correct person using two identifiers. Brooke [name] is currently located at home and nobody is currently with her during this visit. The provider, [name], FNP is located in their home at time of visit.  I discussed the limitations, risks, security and privacy concerns of performing an evaluation and management service by telephone and the availability of in person appointments. I also discussed with the patient that there may be a patient responsible charge related to this service. The patient expressed understanding and agreed to proceed.  Subjective: PCP: [name], FNP  Chief Complaint  Patient presents with  . Diabetes   Diabetes: Patient presents for follow up of diabetes. Current symptoms include: hyperglycemia. Symptoms have gradually improved. Known diabetic complications: nephropathy. Medication compliance: Yes. Current diet: Improving; patient states she used to eat biscuits every single day and has cut them out.  She is decreasing carbs and trying to eat more healthy.. Current exercise: walking. Home blood sugar records: high.  She reports her fasting blood sugar has been just under 200 recently.  She has been on steroids twice in the past 3 months which significantly increased her readings.. Is she  on ACE inhibitor or angiotensin II receptor blocker? Yes. Is she on a statin? Yes.   Lab Results  Component Value Date   HGBA1C 10.8 (H) 07/31/2019   HGBA1C 10.4 (H) 03/13/2019   Lab Results  Component Value Date   LDLCALC 104 (H) 03/13/2019   CREATININE 1.24 (H) 07/31/2019     ROS: Per HPI  Current Outpatient Medications:  .  albuterol (VENTOLIN HFA) 108 (90 Base) MCG/ACT inhaler, Inhale 2 puffs into the lungs every 6 (six) hours as needed for wheezing or shortness of breath., Disp: 18 g, Rfl: 2 .  amLODipine  (NORVASC) 10 MG tablet, Take 1 tablet (10 mg total) by mouth daily., Disp: 90 tablet, Rfl: 0 .  atenolol (TENORMIN) 100 MG tablet, Take 100 mg by mouth every morning. , Disp: , Rfl:  .  budesonide-formoterol (SYMBICORT) 160-4.5 MCG/ACT inhaler, Inhale 2 puffs into the lungs 2 (two) times daily., Disp: 1 Inhaler, Rfl: 2 .  Dulaglutide (TRULICITY) 3 0000000 SOPN, Inject 3 mg into the skin once a week. Inject 3 mg into skin SQ once weekly., Disp: 1 pen, Rfl: 11 .  glipiZIDE (GLUCOTROL) 10 MG tablet, Take 20 mg by mouth 2 (two) times daily before a meal. , Disp: , Rfl:  .  JANUVIA 50 MG tablet, , Disp: , Rfl: 1 .  lisinopril-hydrochlorothiazide (PRINZIDE,ZESTORETIC) 20-25 MG per tablet, Take 1 tablet by mouth daily., Disp: , Rfl:  .  lovastatin (MEVACOR) 20 MG tablet, Take 1 tablet (20 mg total) by mouth daily at 6 PM., Disp: 90 tablet, Rfl: 0  No Known Allergies Past Medical History:  Diagnosis Date  . Bronchitis    frequent bronchitis  . Diabetes mellitus   . Hypertension   . Obesity (BMI 35.0-39.9 without comorbidity)   . Vertigo     Observations/Objective: A&O  No respiratory distress or wheezing audible over the phone Mood, judgement, and thought processes all WNL   Assessment and Plan: 1. Type 2 diabetes mellitus without complication, without long-term current use of insulin (HCC) Lab Results  Component Value Date   HGBA1C 10.8 (H) 07/31/2019   HGBA1C 10.4 (H) 03/13/2019    - Diabetes is  not at goal of A1c < 7. - Medications: Continue glipizide 20 mg twice daily and Januvia 50 mg once daily.  Increase Trulicity from 3 mg to 4.5 mg once weekly. - Home glucose monitoring: Continue - Patient is currently taking a statin. Patient is taking an ACE-inhibitor/ARB.  - Last foot exam: 03/13/2019 - Urine Microalbumin/Creat Ratio: 03/13/2019 - JANUVIA 50 MG tablet; Take 1 tablet (50 mg total) by mouth daily.  Dispense: 90 tablet; Refill: 1 - glipiZIDE (GLUCOTROL) 10 MG tablet; Take 2  tablets (20 mg total) by mouth 2 (two) times daily before a meal.  Dispense: 360 tablet; Refill: 1 - Dulaglutide (TRULICITY) 4.5 0000000 SOPN; Inject 4.5 mg as directed once a week.  Dispense: 6 mL; Refill: 1   Follow Up Instructions: Return in about 3 months (around 11/01/2019) for DM.  I discussed the assessment and treatment plan with the patient. The patient was provided an opportunity to ask questions and all were answered. The patient agreed with the plan and demonstrated an understanding of the instructions.   The patient was advised to call back or seek an in-person evaluation if the symptoms worsen or if the condition fails to improve as anticipated.  The above assessment and management plan was discussed with the patient. The patient verbalized understanding of and has agreed to the management plan. Patient is aware to call the clinic if symptoms persist or worsen. Patient is aware when to return to the clinic for a follow-up visit. Patient educated on when it is appropriate to go to the emergency department.   Time call ended: 10:20 AM  I provided 19 minutes of non-face-to-face time during this encounter.  [name], MSN, APRN, FNP-C [facility] 08/01/19

## 2019-08-02 NOTE — Progress Notes (Signed)
Left message with all details on voice mail for Debra.    Please call our office for any questions.

## 2019-08-16 ENCOUNTER — Telehealth: Payer: Self-pay | Admitting: Pharmacist

## 2019-08-16 NOTE — Telephone Encounter (Signed)
   JANUVIA 100mg  daily SAMPLES LEFT FOR PATIENT UP FRONT   LOT#[id number], EXP 1/23  Instructed patient to cut in half to equal 50mg  daily dose  She is waiting on health dept for patient assistance  Instructed patient to stop Januvia and call us when she receives Trulicity.  These medications are not commonly taken together as stated by the FDA:  Not approved/ recommended Use of GLP-1 agonists and DPP-4 inhibitors in combination is not approved by the FDA nor recommended by the American Diabetes Association

## 2019-08-19 ENCOUNTER — Ambulatory Visit: Payer: Medicare HMO

## 2019-08-19 ENCOUNTER — Other Ambulatory Visit: Payer: Self-pay

## 2019-08-19 ENCOUNTER — Telehealth: Payer: Self-pay | Admitting: Family Medicine

## 2019-08-19 DIAGNOSIS — Z0130 Encounter for examination of blood pressure without abnormal findings: Secondary | ICD-10-CM

## 2019-08-19 NOTE — Telephone Encounter (Signed)
Informed patient of recommendations.  Patient states that she is not having any symptoms but would like to come in to have home bp machine vs office bp machine.  Nurse appt made

## 2019-08-19 NOTE — Progress Notes (Signed)
Patient came in today to match our bp machine with hers.   BP 155/83 P-82   Took with patients bp machine to see if it matches and her bp was 160/89.  Patient states that she has only been taking her atenolol 100mg  for the last 3 days since her bp has been running around 110/62 for the last few days.  She has not been taking her last 2 medications.  Tele visit made with PCP tomorrow to discuss d/cing some of her bp medications.

## 2019-08-19 NOTE — Telephone Encounter (Signed)
This blood pressure is ok.  IF she is feeling symptomatic (light headed, dizzy, fatigued), then she can cut her BP med in 1/2 and follow up with PCP for BP check here in office.

## 2019-08-20 ENCOUNTER — Encounter: Payer: Self-pay | Admitting: Family Medicine

## 2019-08-20 ENCOUNTER — Ambulatory Visit (INDEPENDENT_AMBULATORY_CARE_PROVIDER_SITE_OTHER): Payer: Medicare HMO | Admitting: Family Medicine

## 2019-08-20 DIAGNOSIS — E119 Type 2 diabetes mellitus without complications: Secondary | ICD-10-CM | POA: Diagnosis not present

## 2019-08-20 DIAGNOSIS — E1159 Type 2 diabetes mellitus with other circulatory complications: Secondary | ICD-10-CM | POA: Diagnosis not present

## 2019-08-20 DIAGNOSIS — I152 Hypertension secondary to endocrine disorders: Secondary | ICD-10-CM

## 2019-08-20 DIAGNOSIS — Z596 Low income: Secondary | ICD-10-CM

## 2019-08-20 DIAGNOSIS — I10 Essential (primary) hypertension: Secondary | ICD-10-CM | POA: Diagnosis not present

## 2019-08-20 NOTE — Progress Notes (Signed)
Virtual Visit via Telephone Note  I connected with Brooke [name] on 08/20/19 at 11:13 AM by telephone and verified that I am speaking with the correct person using two identifiers. Brooke [name] is currently located at home and nobody is currently with her during this visit. The provider, [name], FNP is located in their home at time of visit.  I discussed the limitations, risks, security and privacy concerns of performing an evaluation and management service by telephone and the availability of in person appointments. I also discussed with the patient that there may be a patient responsible charge related to this service. The patient expressed understanding and agreed to proceed.  Subjective: PCP: [name], FNP  Chief Complaint  Patient presents with  . Hypertension   Patient reports she has not checked her blood pressure and "a very long time" until this past Friday.  Her systolic blood pressure has ranged 026-378; her diastolic has ranged 58-85; heart rate has been in the 80s.  She has got these readings only taking her atenolol.  She stopped taking amlodipine and the lisinopril-hydrochlorothiazide because her diastolic was in the 02D and she was nervous about it.  She states her whole life she has had high blood pressure so she is not used to getting good readings.  She has been watching her diet and exercising over the past month.  She does feel she has lost some weight.  She came yesterday to have her her blood pressure checked and compared to her machine at home.  It was elevated yesterday, but she had not had any of her medications yet.  Patient does report if she does not take the atenolol she can tell because she feels her heart is racing all day.  She states she was started on a blood pressure medication with hydrochlorothiazide and it 20+ years ago due to a lot of swelling in her lower extremities which resolved.  She denies any increase in swelling since she has  not had the hydrochlorothiazide in the past 3 days.  Patient also wanted to update me on her blood sugars.  She reports her blood sugars have not been below 200 in the past 15 years until the past month, since we changed her medication.  Her fasting blood sugar has ranged on average 130-160s.   Patient also reports that she never heard anything from the prescription assistance that was completed for her to obtain Symbicort.  She reports when she spoke with Almyra Free, our clinical pharmacist, last week she was told if she does not hear anything to let her know and she could help.   ROS: Per HPI  Current Outpatient Medications:  .  albuterol (VENTOLIN HFA) 108 (90 Base) MCG/ACT inhaler, Inhale 2 puffs into the lungs every 6 (six) hours as needed for wheezing or shortness of breath., Disp: 18 g, Rfl: 2 .  amLODipine (NORVASC) 10 MG tablet, Take 1 tablet (10 mg total) by mouth daily., Disp: 90 tablet, Rfl: 1 .  atenolol (TENORMIN) 100 MG tablet, Take 1 tablet (100 mg total) by mouth every morning., Disp: 90 tablet, Rfl: 1 .  budesonide-formoterol (SYMBICORT) 160-4.5 MCG/ACT inhaler, Inhale 2 puffs into the lungs 2 (two) times daily., Disp: 1 Inhaler, Rfl: 2 .  Dulaglutide (TRULICITY) 4.5 XA/1.2IN SOPN, Inject 4.5 mg as directed once a week., Disp: 6 mL, Rfl: 1 .  glipiZIDE (GLUCOTROL) 10 MG tablet, Take 2 tablets (20 mg total) by mouth 2 (two) times daily before a meal.,  Disp: 360 tablet, Rfl: 1 .  JANUVIA 50 MG tablet, Take 1 tablet (50 mg total) by mouth daily., Disp: 90 tablet, Rfl: 1 .  lisinopril-hydrochlorothiazide (ZESTORETIC) 20-25 MG tablet, Take 1 tablet by mouth daily., Disp: 90 tablet, Rfl: 1 .  lovastatin (MEVACOR) 20 MG tablet, Take 1 tablet (20 mg total) by mouth daily at 6 PM., Disp: 90 tablet, Rfl: 1  No Known Allergies Past Medical History:  Diagnosis Date  . Bronchitis    frequent bronchitis  . Diabetes mellitus   . Hypertension   . Obesity (BMI 35.0-39.9 without comorbidity)    . Vertigo     Observations/Objective: A&O  No respiratory distress or wheezing audible over the phone Mood, judgement, and thought processes all WNL   Assessment and Plan: 1. Hypertension associated with diabetes ([place]) - Patient to continue medications for hypertension and keep a log of her blood pressures on a daily basis about an hour after she has had her medications.  She will bring this with her to her appointment scheduled in 2 weeks so that we can decide if some of her medications can be reduced.  2. Type 2 diabetes mellitus without complication, without long-term current use of insulin ([place]) - Encourage patient to continue diet and exercise.  Patient congratulated on efforts and improvement in blood sugars.  3. Patient cannot afford medications - Chart is being forwarded to Almyra Free, our clinical pharmacist, to assist with financial assistance of Symbicort.   Follow Up Instructions: Return as scheduled.  I discussed the assessment and treatment plan with the patient. The patient was provided an opportunity to ask questions and all were answered. The patient agreed with the plan and demonstrated an understanding of the instructions.   The patient was advised to call back or seek an in-person evaluation if the symptoms worsen or if the condition fails to improve as anticipated.  The above assessment and management plan was discussed with the patient. The patient verbalized understanding of and has agreed to the management plan. Patient is aware to call the clinic if symptoms persist or worsen. Patient is aware when to return to the clinic for a follow-up visit. Patient educated on when it is appropriate to go to the emergency department.   Time call ended: 11:26 AM  I provided 15 minutes of non-face-to-face time during this encounter.  [name], MSN, APRN, FNP-C [facility] 08/20/19

## 2019-08-26 ENCOUNTER — Telehealth: Payer: Self-pay | Admitting: Pharmacist

## 2019-08-26 DIAGNOSIS — J410 Simple chronic bronchitis: Secondary | ICD-10-CM

## 2019-08-26 MED ORDER — BUDESONIDE-FORMOTEROL FUMARATE 160-4.5 MCG/ACT IN AERO: 2.0000 | INHALATION_SPRAY | Freq: Two times a day (BID) | RESPIRATORY_TRACT | 2 refills | Status: DC

## 2019-08-26 NOTE — Telephone Encounter (Signed)
Patient is working with health department for prescription assistance. She states she needs help with Symbicort.  Encouraged her to call health department regarding this.  Will fax over RX for symbicort to health dept.

## 2019-08-26 NOTE — Addendum Note (Signed)
Addended by: Lottie Dawson D on: 08/26/2019 05:13 PM   Modules accepted: Orders

## 2019-08-26 NOTE — Telephone Encounter (Signed)
VM left for patient to call PharmD back if appt/assistance needed with medications

## 2019-08-30 ENCOUNTER — Other Ambulatory Visit: Payer: Self-pay

## 2019-08-30 ENCOUNTER — Ambulatory Visit (INDEPENDENT_AMBULATORY_CARE_PROVIDER_SITE_OTHER): Payer: Medicare HMO | Admitting: Family Medicine

## 2019-08-30 ENCOUNTER — Encounter: Payer: Self-pay | Admitting: Family Medicine

## 2019-08-30 VITALS — BP 142/75 | HR 78 | Temp 97.2°F | Ht 64.0 in | Wt 198.4 lb

## 2019-08-30 DIAGNOSIS — J209 Acute bronchitis, unspecified: Secondary | ICD-10-CM

## 2019-08-30 MED ORDER — METHYLPREDNISOLONE 4 MG PO TBPK: ORAL_TABLET | ORAL | 0 refills | Status: DC

## 2019-08-30 NOTE — Patient Instructions (Addendum)
Mucinex twice daily with a full glass of water.   Breztri inhaler - 2 puffs twice daily   Acute Bronchitis, Adult  Acute bronchitis is when air tubes in the lungs (bronchi) suddenly get swollen. The condition can make it hard for you to breathe. In adults, acute bronchitis usually goes away within 2 weeks. A cough caused by bronchitis may last up to 3 weeks. Smoking, allergies, and asthma can make the condition worse. What are the causes? This condition is caused by:  Cold and flu viruses. The most common cause of this condition is the virus that causes the common cold.  Bacteria.  Substances that irritate the lungs, including: ? Smoke from cigarettes and other types of tobacco. ? Dust and pollen. ? Fumes from chemicals, gases, or burned fuel. ? Other materials that pollute indoor or outdoor air.  Close contact with someone who has acute bronchitis. What increases the risk? The following factors may make you more likely to develop this condition:  A weak body's defense system. This is also called the immune system.  Any condition that affects your lungs and breathing, such as asthma. What are the signs or symptoms? Symptoms of this condition include:  A cough.  Coughing up clear, yellow, or green mucus.  Wheezing.  Chest congestion.  Shortness of breath.  A fever.  Body aches.  Chills.  A sore throat. How is this treated? Acute bronchitis may go away over time without treatment. Your doctor may recommend:  Drinking more fluids.  Taking a medicine for a fever or cough.  Using a device that gets medicine into your lungs (inhaler).  Using a vaporizer or a humidifier. These are machines that add water or moisture in the air to help with coughing and poor breathing. Follow these instructions at home:  Activity  Get a lot of rest.  Avoid places where there are fumes from chemicals.  Return to your normal activities as told by your doctor. Ask your doctor  what activities are safe for you. Lifestyle  Drink enough fluids to keep your pee (urine) pale yellow.  Do not drink alcohol.  Do not use any products that contain nicotine or tobacco, such as cigarettes, e-cigarettes, and chewing tobacco. If you need help quitting, ask your doctor. Be aware that: ? Your bronchitis will get worse if you smoke or breathe in other people's smoke (secondhand smoke). ? Your lungs will heal faster if you quit smoking. General instructions  Take over-the-counter and prescription medicines only as told by your doctor.  Use an inhaler, cool mist vaporizer, or humidifier as told by your doctor.  Rinse your mouth often with salt water. To make salt water, dissolve -1 tsp (3-6 g) of salt in 1 cup (237 mL) of warm water.  Keep all follow-up visits as told by your doctor. This is important. How is this prevented? To lower your risk of getting this condition again:  Wash your hands often with soap and water. If soap and water are not available, use hand sanitizer.  Avoid contact with people who have cold symptoms.  Try not to touch your mouth, nose, or eyes with your hands.  Make sure to get the flu shot every year. Contact a doctor if:  Your symptoms do not get better in 2 weeks.  You vomit more than once or twice.  You have symptoms of loss of fluid from your body (dehydration). These include: ? Dark urine. ? Dry skin or eyes. ? Increased thirst. ?  Headaches. ? Confusion. ? Muscle cramps. Get help right away if:  You cough up blood.  You have chest pain.  You have very bad shortness of breath.  You become dehydrated.  You faint or keep feeling like you are going to faint.  You keep vomiting.  You have a very bad headache.  Your fever or chills get worse. These symptoms may be an emergency. Do not wait to see if the symptoms will go away. Get medical help right away. Call your local emergency services (911 in the [place] Do not drive  yourself to the hospital. Summary  Acute bronchitis is when air tubes in the lungs (bronchi) suddenly get swollen. In adults, acute bronchitis usually goes away within 2 weeks.  Take over-the-counter and prescription medicines only as told by your doctor.  Drink enough fluid to keep your pee (urine) pale yellow.  Contact a doctor if your symptoms do not improve after 2 weeks of treatment.  Get help right away if you cough up blood, faint, or have chest pain or shortness of breath. This information is not intended to replace advice given to you by your health care provider. Make sure you discuss any questions you have with your health care provider. Document Revised: 09/14/2018 Document Reviewed: 09/14/2018 Elsevier Patient Education  [place].

## 2019-08-30 NOTE — Progress Notes (Signed)
Assessment & Plan:  1. Acute bronchitis, unspecified organism - Breztri samples given in office today to use until she gets Symbicort through the prescription assistance program. Encouraged to use Mucinex BID with a full glass of water. Education provided on acute bronchitis.  - methylPREDNISolone (MEDROL DOSEPAK) 4 MG TBPK tablet; Use as directed.  Dispense: 21 each; Refill: 0   Return on/after 11/01/19, for DM.  [name], MSN, APRN, FNP-C [facility]  Subjective:    Patient ID: Brooke [name], female    DOB: [birth year], 64 y.o.   MRN: [id number]  Patient Care Team: [name], FNP as PCP - General (Family Medicine)   Chief Complaint:  Chief Complaint  Patient presents with  . Wheezing    x 2 weeks  . Nasal Congestion    HPI: Brooke [name] is a 64 y.o. female presenting on 08/30/2019 for Wheezing (x 2 weeks) and Nasal Congestion  Patient complains of cough, chest congestion, shortness of breath and wheezing. Onset of symptoms was 2 weeks ago, unchanged since that time. She is drinking plenty of fluids. Evaluation to date: none. Treatment to date: Albuterol inhaler Q6H routinely; she has not yet received the Symbicort inhaler. She has a history of chronic bronchitis. She does not smoke. Patient took a coarse of steroids in March and April of this year.    Social history:  Relevant past medical, surgical, family and social history reviewed and updated as indicated. Interim medical history since our last visit reviewed.  Allergies and medications reviewed and updated.  DATA REVIEWED: CHART IN EPIC  ROS: Negative unless specifically indicated above in HPI.    Current Outpatient Medications:  .  albuterol (VENTOLIN HFA) 108 (90 Base) MCG/ACT inhaler, Inhale 2 puffs into the lungs every 6 (six) hours as needed for wheezing or shortness of breath., Disp: 18 g, Rfl: 2 .  amLODipine (NORVASC) 10 MG tablet, Take 1 tablet (10 mg total) by  mouth daily., Disp: 90 tablet, Rfl: 1 .  atenolol (TENORMIN) 100 MG tablet, Take 1 tablet (100 mg total) by mouth every morning., Disp: 90 tablet, Rfl: 1 .  budesonide-formoterol (SYMBICORT) 160-4.5 MCG/ACT inhaler, Inhale 2 puffs into the lungs 2 (two) times daily., Disp: 1 Inhaler, Rfl: 2 .  Dulaglutide (TRULICITY) 4.5 RS/8.5IO SOPN, Inject 4.5 mg as directed once a week., Disp: 6 mL, Rfl: 1 .  glipiZIDE (GLUCOTROL) 10 MG tablet, Take 2 tablets (20 mg total) by mouth 2 (two) times daily before a meal., Disp: 360 tablet, Rfl: 1 .  JANUVIA 50 MG tablet, Take 1 tablet (50 mg total) by mouth daily., Disp: 90 tablet, Rfl: 1 .  lisinopril-hydrochlorothiazide (ZESTORETIC) 20-25 MG tablet, Take 1 tablet by mouth daily., Disp: 90 tablet, Rfl: 1 .  lovastatin (MEVACOR) 20 MG tablet, Take 1 tablet (20 mg total) by mouth daily at 6 PM., Disp: 90 tablet, Rfl: 1   No Known Allergies Past Medical History:  Diagnosis Date  . Bronchitis    frequent bronchitis  . Diabetes mellitus   . Hypertension   . Obesity (BMI 35.0-39.9 without comorbidity)   . Vertigo     Past Surgical History:  Procedure Laterality Date  . BREAST SURGERY Right 1974    Social History   Socioeconomic History  . Marital status: Married    Spouse name: Donnie  . Number of children: 1  . Years of education: Not on file  . Highest education level: Not on file  Occupational History  . Not  on file  Tobacco Use  . Smoking status: Never Smoker  . Smokeless tobacco: Never Used  Vaping Use  . Vaping Use: Never used  Substance and Sexual Activity  . Alcohol use: No  . Drug use: No  . Sexual activity: Never  Other Topics Concern  . Not on file  Social History Narrative  . Not on file   Social Determinants of Health   Financial Resource Strain:   . Difficulty of Paying Living Expenses:   Food Insecurity:   . Worried About Charity fundraiser in the Last Year:   . Arboriculturist in the Last Year:   Transportation Needs:    . Film/video editor (Medical):   Marland Kitchen Lack of Transportation (Non-Medical):   Physical Activity:   . Days of Exercise per Week:   . Minutes of Exercise per Session:   Stress:   . Feeling of Stress :   Social Connections:   . Frequency of Communication with Friends and Family:   . Frequency of Social Gatherings with Friends and Family:   . Attends Religious Services:   . Active Member of Clubs or Organizations:   . Attends Archivist Meetings:   Marland Kitchen Marital Status:   Intimate Partner Violence:   . Fear of Current or Ex-Partner:   . Emotionally Abused:   Marland Kitchen Physically Abused:   . Sexually Abused:         Objective:    BP (!) 142/75   Pulse 78   Temp (!) 97.2 F (36.2 C) (Temporal)   Ht 5\' 4"  (1.626 m)   Wt 198 lb 6.4 oz (90 kg)   LMP 03/16/2012   SpO2 97%   BMI 34.06 kg/m   Wt Readings from Last 3 Encounters:  08/30/19 198 lb 6.4 oz (90 kg)  03/13/19 202 lb (91.6 kg)  05/03/18 197 lb 9.6 oz (89.6 kg)    Physical Exam Vitals reviewed.  Constitutional:      General: She is not in acute distress.    Appearance: Normal appearance. She is not ill-appearing, toxic-appearing or diaphoretic.  HENT:     Head: Normocephalic and atraumatic.  Eyes:     General: No scleral icterus.       Right eye: No discharge.        Left eye: No discharge.     Conjunctiva/sclera: Conjunctivae normal.  Cardiovascular:     Rate and Rhythm: Normal rate and regular rhythm.     Heart sounds: Normal heart sounds. No murmur heard.  No friction rub. No gallop.   Pulmonary:     Effort: Pulmonary effort is normal. No respiratory distress.     Breath sounds: No stridor. Wheezing and rhonchi present. No rales.  Musculoskeletal:        General: Normal range of motion.     Cervical back: Normal range of motion.  Skin:    General: Skin is warm and dry.     Capillary Refill: Capillary refill takes less than 2 seconds.  Neurological:     General: No focal deficit present.     Mental  Status: She is alert and oriented to person, place, and time. Mental status is at baseline.  Psychiatric:        Mood and Affect: Mood normal.        Behavior: Behavior normal.        Thought Content: Thought content normal.        Judgment: Judgment normal.  Lab Results  Component Value Date   TSH 1.880 03/13/2019   Lab Results  Component Value Date   WBC 9.0 03/13/2019   HGB 13.2 03/13/2019   HCT 40.0 03/13/2019   MCV 82 03/13/2019   PLT 400 03/13/2019   Lab Results  Component Value Date   NA 139 07/31/2019   K 4.1 07/31/2019   CO2 22 07/31/2019   GLUCOSE 184 (H) 07/31/2019   BUN 14 07/31/2019   CREATININE 1.24 (H) 07/31/2019   BILITOT 0.2 07/31/2019   ALKPHOS 83 07/31/2019   AST 18 07/31/2019   ALT 20 07/31/2019   PROT 6.4 07/31/2019   ALBUMIN 4.0 07/31/2019   CALCIUM 9.5 07/31/2019   ANIONGAP 7 05/03/2018   Lab Results  Component Value Date   CHOL 189 03/13/2019   Lab Results  Component Value Date   HDL 41 03/13/2019   Lab Results  Component Value Date   LDLCALC 104 (H) 03/13/2019   Lab Results  Component Value Date   TRIG 258 (H) 03/13/2019   Lab Results  Component Value Date   CHOLHDL 4.6 (H) 03/13/2019   Lab Results  Component Value Date   HGBA1C 10.8 (H) 07/31/2019

## 2019-09-04 ENCOUNTER — Ambulatory Visit: Payer: Medicare HMO | Admitting: Family Medicine

## 2019-09-10 ENCOUNTER — Telehealth: Payer: Self-pay | Admitting: Family Medicine

## 2019-09-10 DIAGNOSIS — E119 Type 2 diabetes mellitus without complications: Secondary | ICD-10-CM

## 2019-09-10 MED ORDER — METFORMIN HCL ER 500 MG PO TB24: 500.0000 mg | ORAL_TABLET | Freq: Two times a day (BID) | ORAL | 1 refills | Status: DC

## 2019-09-10 NOTE — Telephone Encounter (Signed)
Patient aware and verbalizes understanding. 

## 2019-09-10 NOTE — Telephone Encounter (Signed)
Prescription sent

## 2019-09-10 NOTE — Telephone Encounter (Signed)
  Prescription Request  09/10/2019  What is the name of the medication or equipment? Metformin HCL 500MG  one tablet twice a day  Have you contacted your pharmacy to request a refill? (if applicable) no new rx because she was getting it from [facility] would you like this sent to? [facility]   Patient notified that their request is being sent to the clinical staff for review and that they should receive a response within 2 business days.

## 2019-09-10 NOTE — Telephone Encounter (Signed)
Spoke to pt and she got her medication from the [facility] and her contact to get refills was Hilda Blades [phone].[facility]- Metformin HCL 500mg  1 tablet PO BID was verified with Hilda Blades at the [facility].

## 2019-09-10 NOTE — Telephone Encounter (Signed)
Can we call pharmacy and verify she has been getting this filled?

## 2019-09-10 NOTE — Telephone Encounter (Signed)
Metformin isn't on her current med list. Ok to refill?

## 2019-11-05 ENCOUNTER — Ambulatory Visit: Payer: Medicare HMO | Admitting: Family Medicine

## 2019-11-19 ENCOUNTER — Ambulatory Visit: Payer: Medicare HMO | Admitting: Family Medicine

## 2019-11-28 ENCOUNTER — Encounter: Payer: Self-pay | Admitting: Family Medicine

## 2019-11-28 ENCOUNTER — Other Ambulatory Visit: Payer: Self-pay

## 2019-11-28 ENCOUNTER — Ambulatory Visit (INDEPENDENT_AMBULATORY_CARE_PROVIDER_SITE_OTHER): Payer: Medicare HMO | Admitting: Family Medicine

## 2019-11-28 VITALS — BP 139/80 | HR 75 | Temp 97.6°F | Ht 64.0 in | Wt 198.6 lb

## 2019-11-28 DIAGNOSIS — E119 Type 2 diabetes mellitus without complications: Secondary | ICD-10-CM

## 2019-11-28 DIAGNOSIS — E1121 Type 2 diabetes mellitus with diabetic nephropathy: Secondary | ICD-10-CM | POA: Diagnosis not present

## 2019-11-28 DIAGNOSIS — N183 Chronic kidney disease, stage 3 unspecified: Secondary | ICD-10-CM

## 2019-11-28 DIAGNOSIS — E785 Hyperlipidemia, unspecified: Secondary | ICD-10-CM | POA: Diagnosis not present

## 2019-11-28 DIAGNOSIS — E1169 Type 2 diabetes mellitus with other specified complication: Secondary | ICD-10-CM

## 2019-11-28 DIAGNOSIS — J410 Simple chronic bronchitis: Secondary | ICD-10-CM | POA: Diagnosis not present

## 2019-11-28 DIAGNOSIS — E1122 Type 2 diabetes mellitus with diabetic chronic kidney disease: Secondary | ICD-10-CM

## 2019-11-28 DIAGNOSIS — Z23 Encounter for immunization: Secondary | ICD-10-CM

## 2019-11-28 DIAGNOSIS — I10 Essential (primary) hypertension: Secondary | ICD-10-CM

## 2019-11-28 DIAGNOSIS — E1159 Type 2 diabetes mellitus with other circulatory complications: Secondary | ICD-10-CM | POA: Diagnosis not present

## 2019-11-28 LAB — BAYER DCA HB A1C WAIVED: HB A1C (BAYER DCA - WAIVED): 11.9 % — ABNORMAL HIGH (ref <7.0)

## 2019-11-28 MED ORDER — ATENOLOL 100 MG PO TABS: 100.0000 mg | ORAL_TABLET | Freq: Every morning | ORAL | 1 refills | Status: DC

## 2019-11-28 MED ORDER — GLIPIZIDE 10 MG PO TABS: 20.0000 mg | ORAL_TABLET | Freq: Two times a day (BID) | ORAL | 1 refills | Status: DC

## 2019-11-28 MED ORDER — DAPAGLIFLOZIN PROPANEDIOL 10 MG PO TABS: 10.0000 mg | ORAL_TABLET | Freq: Every day | ORAL | 2 refills | Status: DC

## 2019-11-28 MED ORDER — LISINOPRIL-HYDROCHLOROTHIAZIDE 20-25 MG PO TABS: 1.0000 | ORAL_TABLET | Freq: Every day | ORAL | 1 refills | Status: DC

## 2019-11-28 MED ORDER — AMLODIPINE BESYLATE 10 MG PO TABS: 10.0000 mg | ORAL_TABLET | Freq: Every day | ORAL | 1 refills | Status: DC

## 2019-11-28 MED ORDER — LOVASTATIN 20 MG PO TABS: 20.0000 mg | ORAL_TABLET | Freq: Every day | ORAL | 1 refills | Status: DC

## 2019-11-28 NOTE — Progress Notes (Signed)
Assessment & Plan:  1. Type 2 diabetes mellitus without complication, without long-term current use of insulin (HCC) Lab Results  Component Value Date   HGBA1C 11.9 (H) 11/28/2019   HGBA1C 10.8 (H) 07/31/2019   HGBA1C 10.4 (H) 03/13/2019  - Diabetes is not at goal of A1c < 7. - Medications: continue metformin 500 mg BID, increase Trulicity to the 4.5 mg dosage, stop Januvia, start [place] glucose monitoring: continue monitoring - Patient is currently taking a statin. Patient is taking an ACE-inhibitor/ARB.  - Last foot exam: 03/13/2019 - Last diabetic eye exam: unknown - Urine Microalbumin/Creat Ratio: 03/13/2019 - Instruction/counseling given: reminded to get eye exam, discussed diet and provided printed educational material - Bayer DCA Hb A1c Waived - glipiZIDE (GLUCOTROL) 10 MG tablet; Take 2 tablets (20 mg total) by mouth 2 (two) times daily before a meal.  Dispense: 360 tablet; Refill: 1 - lisinopril-hydrochlorothiazide (ZESTORETIC) 20-25 MG tablet; Take 1 tablet by mouth daily.  Dispense: 90 tablet; Refill: 1 - dapagliflozin propanediol (FARXIGA) 10 MG TABS tablet; Take 1 tablet (10 mg total) by mouth daily before breakfast.  Dispense: 30 tablet; Refill: 2 - Lipid panel - CMP14+EGFR  2. CKD stage 3 due to type 2 diabetes mellitus ([place]) - Started [place] today. - dapagliflozin propanediol (FARXIGA) 10 MG TABS tablet; Take 1 tablet (10 mg total) by mouth daily before breakfast.  Dispense: 30 tablet; Refill: 2 - CMP14+EGFR  3. Diabetic nephropathy associated with type 2 diabetes mellitus ([place]) - Patient is on an ACE-inhibitor. We are working on better control of diabetes.   4. Hypertension associated with diabetes ([place]) - Well controlled on current regimen.  - amLODipine (NORVASC) 10 MG tablet; Take 1 tablet (10 mg total) by mouth daily.  Dispense: 90 tablet; Refill: 1 - atenolol (TENORMIN) 100 MG tablet; Take 1 tablet (100 mg total) by mouth every morning.  Dispense: 90  tablet; Refill: 1 - lisinopril-hydrochlorothiazide (ZESTORETIC) 20-25 MG tablet; Take 1 tablet by mouth daily.  Dispense: 90 tablet; Refill: 1 - Lipid panel - CMP14+EGFR  5. Hyperlipidemia associated with type 2 diabetes mellitus (HCC) - Lipid panel - CMP14+EGFR  6. Simple chronic bronchitis ([place]) - Encouraged to use Symbicort as prescribed, not as needed.   7. Need for immunization against influenza - Flu Vaccine QUAD High Dose(Fluad)   Return in about 4 weeks (around 12/26/2019) for DM with Almyra Free; then 3 months with me for DM.  [name], MSN, APRN, FNP-C [facility]  Subjective:    Patient ID: Brooke [name], female    DOB: 1955/07/12, 64 y.o.   MRN: [id number]  Patient Care Team: [name], FNP as PCP - General (Family Medicine)   Chief Complaint:  Chief Complaint  Patient presents with  . Diabetes    check up of chronic medical conditions  . chest congestion    Patient states that it has been ongoing and has gotten better but she still has congestion.     HPI: Brooke [name] is a 64 y.o. female presenting on 11/28/2019 for Diabetes (check up of chronic medical conditions) and chest congestion (Patient states that it has been ongoing and has gotten better but she still has congestion. )  Diabetes: Patient presents for follow up of diabetes. Current symptoms include: hyperglycemia. Known diabetic complications: nephropathy. Medication compliance: she is taking her current medications but did not increase Trulicity to 4.5 mg as she has been using the 3 mg she has at home. Current diet: in  general, an "unhealthy" diet. Current exercise: none. Is she  on ACE inhibitor or angiotensin II receptor blocker? Yes. Is she on a statin? Yes.   Lab Results  Component Value Date   HGBA1C 11.9 (H) 11/28/2019   HGBA1C 10.8 (H) 07/31/2019   HGBA1C 10.4 (H) 03/13/2019   Lab Results  Component Value Date   LDLCALC 128 (H) 11/28/2019   CREATININE  1.51 (H) 11/28/2019     New complaints: Patient reports her chest congestion improved for a little bit but has not resolved. She used Mucinex for a short time and has only been using Symbicort as needed.    Social history:  Relevant past medical, surgical, family and social history reviewed and updated as indicated. Interim medical history since our last visit reviewed.  Allergies and medications reviewed and updated.  DATA REVIEWED: CHART IN EPIC  ROS: Negative unless specifically indicated above in HPI.    Current Outpatient Medications:  .  albuterol (VENTOLIN HFA) 108 (90 Base) MCG/ACT inhaler, Inhale 2 puffs into the lungs every 6 (six) hours as needed for wheezing or shortness of breath., Disp: 18 g, Rfl: 2 .  amLODipine (NORVASC) 10 MG tablet, Take 1 tablet (10 mg total) by mouth daily., Disp: 90 tablet, Rfl: 1 .  atenolol (TENORMIN) 100 MG tablet, Take 1 tablet (100 mg total) by mouth every morning., Disp: 90 tablet, Rfl: 1 .  budesonide-formoterol (SYMBICORT) 160-4.5 MCG/ACT inhaler, Inhale 2 puffs into the lungs 2 (two) times daily., Disp: 1 Inhaler, Rfl: 2 .  Dulaglutide (TRULICITY) 4.5 CH/8.5ID SOPN, Inject 4.5 mg as directed once a week., Disp: 6 mL, Rfl: 1 .  glipiZIDE (GLUCOTROL) 10 MG tablet, Take 2 tablets (20 mg total) by mouth 2 (two) times daily before a meal., Disp: 360 tablet, Rfl: 1 .  JANUVIA 50 MG tablet, Take 1 tablet (50 mg total) by mouth daily., Disp: 90 tablet, Rfl: 1 .  lisinopril-hydrochlorothiazide (ZESTORETIC) 20-25 MG tablet, Take 1 tablet by mouth daily., Disp: 90 tablet, Rfl: 1 .  lovastatin (MEVACOR) 20 MG tablet, Take 1 tablet (20 mg total) by mouth daily at 6 PM., Disp: 90 tablet, Rfl: 1 .  metFORMIN (GLUCOPHAGE-XR) 500 MG 24 hr tablet, Take 1 tablet (500 mg total) by mouth 2 (two) times daily with a meal., Disp: 180 tablet, Rfl: 1   No Known Allergies Past Medical History:  Diagnosis Date  . Bronchitis    frequent bronchitis  . Diabetes  mellitus   . Hypertension   . Obesity (BMI 35.0-39.9 without comorbidity)   . Vertigo     Past Surgical History:  Procedure Laterality Date  . BREAST SURGERY Right 1974    Social History   Socioeconomic History  . Marital status: Married    Spouse name: Donnie  . Number of children: 1  . Years of education: Not on file  . Highest education level: Not on file  Occupational History  . Not on file  Tobacco Use  . Smoking status: Never Smoker  . Smokeless tobacco: Never Used  Vaping Use  . Vaping Use: Never used  Substance and Sexual Activity  . Alcohol use: No  . Drug use: No  . Sexual activity: Never  Other Topics Concern  . Not on file  Social History Narrative  . Not on file   Social Determinants of Health   Financial Resource Strain:   . Difficulty of Paying Living Expenses: Not on file  Food Insecurity:   . Worried About Estate manager/land agent  of Food in the Last Year: Not on file  . Ran Out of Food in the Last Year: Not on file  Transportation Needs:   . Lack of Transportation (Medical): Not on file  . Lack of Transportation (Non-Medical): Not on file  Physical Activity:   . Days of Exercise per Week: Not on file  . Minutes of Exercise per Session: Not on file  Stress:   . Feeling of Stress : Not on file  Social Connections:   . Frequency of Communication with Friends and Family: Not on file  . Frequency of Social Gatherings with Friends and Family: Not on file  . Attends Religious Services: Not on file  . Active Member of Clubs or Organizations: Not on file  . Attends Archivist Meetings: Not on file  . Marital Status: Not on file  Intimate Partner Violence:   . Fear of Current or Ex-Partner: Not on file  . Emotionally Abused: Not on file  . Physically Abused: Not on file  . Sexually Abused: Not on file        Objective:    BP 139/80   Pulse 75   Temp 97.6 F (36.4 C) (Temporal)   Ht '5\' 4"'  (1.626 m)   Wt 198 lb 9.6 oz (90.1 kg)   LMP  03/16/2012   SpO2 97%   BMI 34.09 kg/m   Wt Readings from Last 3 Encounters:  11/28/19 198 lb 9.6 oz (90.1 kg)  08/30/19 198 lb 6.4 oz (90 kg)  03/13/19 202 lb (91.6 kg)    Physical Exam Vitals reviewed.  Constitutional:      General: She is not in acute distress.    Appearance: Normal appearance. She is obese. She is not ill-appearing, toxic-appearing or diaphoretic.  HENT:     Head: Normocephalic and atraumatic.  Eyes:     General: No scleral icterus.       Right eye: No discharge.        Left eye: No discharge.     Conjunctiva/sclera: Conjunctivae normal.  Cardiovascular:     Rate and Rhythm: Normal rate and regular rhythm.     Heart sounds: Normal heart sounds. No murmur heard.  No friction rub. No gallop.   Pulmonary:     Effort: Pulmonary effort is normal. No respiratory distress.     Breath sounds: Normal breath sounds. No stridor. No wheezing, rhonchi or rales.  Musculoskeletal:        General: Normal range of motion.     Cervical back: Normal range of motion.  Skin:    General: Skin is warm and dry.     Capillary Refill: Capillary refill takes less than 2 seconds.  Neurological:     General: No focal deficit present.     Mental Status: She is alert and oriented to person, place, and time. Mental status is at baseline.  Psychiatric:        Mood and Affect: Mood normal.        Behavior: Behavior normal.        Thought Content: Thought content normal.        Judgment: Judgment normal.     Lab Results  Component Value Date   TSH 1.880 03/13/2019   Lab Results  Component Value Date   WBC 9.0 03/13/2019   HGB 13.2 03/13/2019   HCT 40.0 03/13/2019   MCV 82 03/13/2019   PLT 400 03/13/2019   Lab Results  Component Value Date   NA 139  07/31/2019   K 4.1 07/31/2019   CO2 22 07/31/2019   GLUCOSE 184 (H) 07/31/2019   BUN 14 07/31/2019   CREATININE 1.24 (H) 07/31/2019   BILITOT 0.2 07/31/2019   ALKPHOS 83 07/31/2019   AST 18 07/31/2019   ALT 20  07/31/2019   PROT 6.4 07/31/2019   ALBUMIN 4.0 07/31/2019   CALCIUM 9.5 07/31/2019   ANIONGAP 7 05/03/2018   Lab Results  Component Value Date   CHOL 189 03/13/2019   Lab Results  Component Value Date   HDL 41 03/13/2019   Lab Results  Component Value Date   LDLCALC 104 (H) 03/13/2019   Lab Results  Component Value Date   TRIG 258 (H) 03/13/2019   Lab Results  Component Value Date   CHOLHDL 4.6 (H) 03/13/2019   Lab Results  Component Value Date   HGBA1C 10.8 (H) 07/31/2019

## 2019-11-28 NOTE — Patient Instructions (Addendum)
Take 1/2 tablet (5 mg) of Farxiga for the first week, then increase to a whole tablet.   Stop Januvia  Increase Trulicity to 4.5 mg once weekly.    Diabetes Mellitus and Nutrition, Adult When you have diabetes (diabetes mellitus), it is very important to have healthy eating habits because your blood sugar (glucose) levels are greatly affected by what you eat and drink. Eating healthy foods in the appropriate amounts, at about the same times every day, can help you:  Control your blood glucose.  Lower your risk of heart disease.  Improve your blood pressure.  Reach or maintain a healthy weight. Every person with diabetes is different, and each person has different needs for a meal plan. Your health care provider may recommend that you work with a diet and nutrition specialist (dietitian) to make a meal plan that is best for you. Your meal plan may vary depending on factors such as:  The calories you need.  The medicines you take.  Your weight.  Your blood glucose, blood pressure, and cholesterol levels.  Your activity level.  Other health conditions you have, such as heart or kidney disease. How do carbohydrates affect me? Carbohydrates, also called carbs, affect your blood glucose level more than any other type of food. Eating carbs naturally raises the amount of glucose in your blood. Carb counting is a method for keeping track of how many carbs you eat. Counting carbs is important to keep your blood glucose at a healthy level, especially if you use insulin or take certain oral diabetes medicines. It is important to know how many carbs you can safely have in each meal. This is different for every person. Your dietitian can help you calculate how many carbs you should have at each meal and for each snack. Foods that contain carbs include:  Bread, cereal, rice, pasta, and crackers.  Potatoes and corn.  Peas, beans, and lentils.  Milk and yogurt.  Fruit and  juice.  Desserts, such as cakes, cookies, ice cream, and candy. How does alcohol affect me? Alcohol can cause a sudden decrease in blood glucose (hypoglycemia), especially if you use insulin or take certain oral diabetes medicines. Hypoglycemia can be a life-threatening condition. Symptoms of hypoglycemia (sleepiness, dizziness, and confusion) are similar to symptoms of having too much alcohol. If your health care provider says that alcohol is safe for you, follow these guidelines:  Limit alcohol intake to no more than 1 drink per day for nonpregnant women and 2 drinks per day for men. One drink equals 12 oz of beer, 5 oz of wine, or 1 oz of hard liquor.  Do not drink on an empty stomach.  Keep yourself hydrated with water, diet soda, or unsweetened iced tea.  Keep in mind that regular soda, juice, and other mixers may contain a lot of sugar and must be counted as carbs. What are tips for following this plan?  Reading food labels  Start by checking the serving size on the "Nutrition Facts" label of packaged foods and drinks. The amount of calories, carbs, fats, and other nutrients listed on the label is based on one serving of the item. Many items contain more than one serving per package.  Check the total grams (g) of carbs in one serving. You can calculate the number of servings of carbs in one serving by dividing the total carbs by 15. For example, if a food has 30 g of total carbs, it would be equal to 2 servings of  carbs.  Check the number of grams (g) of saturated and trans fats in one serving. Choose foods that have low or no amount of these fats.  Check the number of milligrams (mg) of salt (sodium) in one serving. Most people should limit total sodium intake to less than 2,300 mg per day.  Always check the nutrition information of foods labeled as "low-fat" or "nonfat". These foods may be higher in added sugar or refined carbs and should be avoided.  Talk to your dietitian to  identify your daily goals for nutrients listed on the label. Shopping  Avoid buying canned, premade, or processed foods. These foods tend to be high in fat, sodium, and added sugar.  Shop around the outside edge of the grocery store. This includes fresh fruits and vegetables, bulk grains, fresh meats, and fresh dairy. Cooking  Use low-heat cooking methods, such as baking, instead of high-heat cooking methods like deep frying.  Cook using healthy oils, such as olive, canola, or sunflower oil.  Avoid cooking with butter, cream, or high-fat meats. Meal planning  Eat meals and snacks regularly, preferably at the same times every day. Avoid going long periods of time without eating.  Eat foods high in fiber, such as fresh fruits, vegetables, beans, and whole grains. Talk to your dietitian about how many servings of carbs you can eat at each meal.  Eat 4-6 ounces (oz) of lean protein each day, such as lean meat, chicken, fish, eggs, or tofu. One oz of lean protein is equal to: ? 1 oz of meat, chicken, or fish. ? 1 egg. ?  cup of tofu.  Eat some foods each day that contain healthy fats, such as avocado, nuts, seeds, and fish. Lifestyle  Check your blood glucose regularly.  Exercise regularly as told by your health care provider. This may include: ? 150 minutes of moderate-intensity or vigorous-intensity exercise each week. This could be brisk walking, biking, or water aerobics. ? Stretching and doing strength exercises, such as yoga or weightlifting, at least 2 times a week.  Take medicines as told by your health care provider.  Do not use any products that contain nicotine or tobacco, such as cigarettes and e-cigarettes. If you need help quitting, ask your health care provider.  Work with a Social worker or diabetes educator to identify strategies to manage stress and any emotional and social challenges. Questions to ask a health care provider  Do I need to meet with a diabetes  educator?  Do I need to meet with a dietitian?  What number can I call if I have questions?  When are the best times to check my blood glucose? Where to find more information:  American Diabetes Association: diabetes.org  Academy of Nutrition and Dietetics: www.eatright.[employer] of Diabetes and Digestive and Kidney Diseases (NIH): [employer] Summary  A healthy meal plan will help you control your blood glucose and maintain a healthy lifestyle.  Working with a diet and nutrition specialist (dietitian) can help you make a meal plan that is best for you.  Keep in mind that carbohydrates (carbs) and alcohol have immediate effects on your blood glucose levels. It is important to count carbs and to use alcohol carefully. This information is not intended to replace advice given to you by your health care provider. Make sure you discuss any questions you have with your health care provider. Document Revised: 02/03/2017 Document Reviewed: 03/28/2016 Elsevier Patient Education  2020 [employer].

## 2019-11-29 LAB — CMP14+EGFR
ALT: 22 IU/L (ref 0–32)
AST: 18 IU/L (ref 0–40)
Albumin/Globulin Ratio: 1.3 (ref 1.2–2.2)
Albumin: 4.3 g/dL (ref 3.8–4.8)
Alkaline Phosphatase: 93 IU/L (ref 44–121)
BUN/Creatinine Ratio: 15 (ref 12–28)
BUN: 23 mg/dL (ref 8–27)
Bilirubin Total: 0.3 mg/dL (ref 0.0–1.2)
CO2: 24 mmol/L (ref 20–29)
Calcium: 9.6 mg/dL (ref 8.7–10.3)
Chloride: 103 mmol/L (ref 96–106)
Creatinine, Ser: 1.51 mg/dL — ABNORMAL HIGH (ref 0.57–1.00)
GFR calc Af Amer: 42 mL/min/{1.73_m2} — ABNORMAL LOW (ref >59)
GFR calc non Af Amer: 36 mL/min/{1.73_m2} — ABNORMAL LOW (ref >59)
Globulin, Total: 3.2 g/dL (ref 1.5–4.5)
Glucose: 198 mg/dL — ABNORMAL HIGH (ref 65–99)
Potassium: 4.6 mmol/L (ref 3.5–5.2)
Sodium: 141 mmol/L (ref 134–144)
Total Protein: 7.5 g/dL (ref 6.0–8.5)

## 2019-11-29 LAB — LIPID PANEL
Chol/HDL Ratio: 5 ratio — ABNORMAL HIGH (ref 0.0–4.4)
Cholesterol, Total: 210 mg/dL — ABNORMAL HIGH (ref 100–199)
HDL: 42 mg/dL (ref >39)
LDL Chol Calc (NIH): 128 mg/dL — ABNORMAL HIGH (ref 0–99)
Triglycerides: 223 mg/dL — ABNORMAL HIGH (ref 0–149)
VLDL Cholesterol Cal: 40 mg/dL (ref 5–40)

## 2019-12-02 ENCOUNTER — Telehealth: Payer: Self-pay | Admitting: Family Medicine

## 2019-12-02 NOTE — Telephone Encounter (Signed)
Pts says her Brooke [name] Rx is at [facility] currently but she is unable to get it due to cost. Pt says its going to cost her $95 and she cant afford that. Please advise on pt's options.

## 2019-12-02 NOTE — Telephone Encounter (Signed)
Appointment made and patient aware.

## 2019-12-02 NOTE — Telephone Encounter (Signed)
Can you have patient schedule with me so we can get her free medication through the drug company that makes [place]?  30 min pharmacy clinic appt  We would like for her to stay on this medication because of the great heart and kidney benefits---as well as helping her sugar  Thank you! Almyra Free

## 2019-12-03 ENCOUNTER — Ambulatory Visit: Payer: Medicare HMO | Admitting: Pharmacist

## 2019-12-04 ENCOUNTER — Ambulatory Visit (INDEPENDENT_AMBULATORY_CARE_PROVIDER_SITE_OTHER): Payer: Medicare HMO | Admitting: Pharmacist

## 2019-12-04 ENCOUNTER — Other Ambulatory Visit: Payer: Self-pay

## 2019-12-04 DIAGNOSIS — E119 Type 2 diabetes mellitus without complications: Secondary | ICD-10-CM

## 2019-12-04 NOTE — Progress Notes (Signed)
    12/04/2019 Name: Brooke [name] MRN: [id number] DOB: 1955/05/27   S:   1 yoF Presents for diabetes evaluation, education, and management Patient was referred and last seen by Primary Care Provider on 11/28/19  Insurance coverage/medication affordability: Psychologist, prison and probation services Of note, patient gets most of her medications from [facility]  Patient reports adherence with medications. . Current diabetes medications include: trulicity, new start faxiga, metformin, glipizide . Current hypertension medications include: lisinopril, hctz, atenolol, amlodipine Goal 130/80 . Current hyperlipidemia medications include: lovastatin   Patient denies hypoglycemic events.   Patient reported dietary habits: Eats 2-3 meals/day The patient is asked to make an attempt to improve diet and exercise patterns to aid in medical management of this problem.'  Discussed meal planning options and Plate method for healthy eating . Avoid sugary drinks and desserts . Incorporate balanced protein, non starchy veggies, 1 serving of carbohydrate with each meal . Increase water intake . Increase physical activity as able  Patient-reported exercise habits: n/a  O:  Lab Results  Component Value Date   HGBA1C 11.9 (H) 11/28/2019    Lipid Panel     Component Value Date/Time   CHOL 210 (H) 11/28/2019 1512   TRIG 223 (H) 11/28/2019 1512   HDL 42 11/28/2019 1512   CHOLHDL 5.0 (H) 11/28/2019 1512   LDLCALC 128 (H) 11/28/2019 1512     Home fasting blood sugars: n/a  2 hour post-meal/random blood sugars: n/a.    A/P:  Diabetes T2DM currently uncontrolled. Patient is adherent with medication.   -Continue trulicity 4.5mg  sq weekly  -Continue metformin (GFR 42)  -Continue glipizide for now, will work to discontinue with optimization of other medications  -Started SGLT2-I FAXRIGA (generic name DAPAGLIFLOZIN) 5MG  DAILY IN THE MORNING.  WILL INCREASE DOSE TO 10MG  DAILY ONCE PATIENT ASSISTANCE  SHIPMENT ARRIVES  -FARXIGA 5MG  SAMPLES GIVEN JYN#[id number], EXP4/24, #[id number]  -Counseled Sick day rules: if sick, vomiting, have diarrhea, or  cannot drink enough fluids, you should stop taking SGLT-2 inhibitors until your symptoms go away. In rare cases, these medicines can cause diabetic ketoacidosis (DKA). DKA is acid buildup in the blood.  -APPLICATION SUBMITTED TO [facility] FOR PATIENT ASSISTANCE  -Extensively discussed pathophysiology of diabetes, recommended lifestyle interventions, dietary effects on blood sugar control  -Counseled on s/sx of and management of hypoglycemia  -Next A1C anticipated 3 MONTHS.    Written patient instructions provided.  Total time in face to face counseling 25 minutes.   Follow up Pharmacist/PCP Clinic Visit IN 4 WEEKS.    Regina Eck, PharmD, BCPS Clinical Pharmacist, [place]  II Phone [phone]

## 2019-12-08 ENCOUNTER — Other Ambulatory Visit: Payer: Self-pay | Admitting: Family Medicine

## 2019-12-08 DIAGNOSIS — N183 Chronic kidney disease, stage 3 unspecified: Secondary | ICD-10-CM

## 2019-12-08 DIAGNOSIS — E1122 Type 2 diabetes mellitus with diabetic chronic kidney disease: Secondary | ICD-10-CM

## 2019-12-08 MED ORDER — LOVASTATIN 40 MG PO TABS: 40.0000 mg | ORAL_TABLET | Freq: Every day | ORAL | 2 refills | Status: DC

## 2019-12-09 ENCOUNTER — Encounter: Payer: Self-pay | Admitting: Family Medicine

## 2019-12-09 ENCOUNTER — Telehealth: Payer: Self-pay

## 2019-12-09 DIAGNOSIS — E1121 Type 2 diabetes mellitus with diabetic nephropathy: Secondary | ICD-10-CM | POA: Insufficient documentation

## 2019-12-25 ENCOUNTER — Encounter: Payer: Medicare HMO | Admitting: Family Medicine

## 2019-12-26 ENCOUNTER — Encounter (HOSPITAL_COMMUNITY): Payer: Self-pay | Admitting: *Deleted

## 2019-12-26 ENCOUNTER — Other Ambulatory Visit: Payer: Self-pay

## 2019-12-26 ENCOUNTER — Emergency Department (HOSPITAL_COMMUNITY)
Admission: EM | Admit: 2019-12-26 | Discharge: 2019-12-26 | Disposition: A | Discharge status: Home / Self Care | Payer: Medicare HMO | Attending: Emergency Medicine | Admitting: Emergency Medicine

## 2019-12-26 DIAGNOSIS — N183 Chronic kidney disease, stage 3 unspecified: Secondary | ICD-10-CM | POA: Diagnosis not present

## 2019-12-26 DIAGNOSIS — R42 Dizziness and giddiness: Secondary | ICD-10-CM | POA: Diagnosis not present

## 2019-12-26 DIAGNOSIS — R531 Weakness: Secondary | ICD-10-CM | POA: Insufficient documentation

## 2019-12-26 DIAGNOSIS — Z20822 Contact with and (suspected) exposure to covid-19: Secondary | ICD-10-CM | POA: Diagnosis not present

## 2019-12-26 DIAGNOSIS — Z7984 Long term (current) use of oral hypoglycemic drugs: Secondary | ICD-10-CM | POA: Diagnosis not present

## 2019-12-26 DIAGNOSIS — Z7982 Long term (current) use of aspirin: Secondary | ICD-10-CM | POA: Diagnosis not present

## 2019-12-26 DIAGNOSIS — E1122 Type 2 diabetes mellitus with diabetic chronic kidney disease: Secondary | ICD-10-CM | POA: Insufficient documentation

## 2019-12-26 DIAGNOSIS — I129 Hypertensive chronic kidney disease with stage 1 through stage 4 chronic kidney disease, or unspecified chronic kidney disease: Secondary | ICD-10-CM | POA: Insufficient documentation

## 2019-12-26 DIAGNOSIS — Z8616 Personal history of COVID-19: Secondary | ICD-10-CM | POA: Insufficient documentation

## 2019-12-26 DIAGNOSIS — E1121 Type 2 diabetes mellitus with diabetic nephropathy: Secondary | ICD-10-CM | POA: Diagnosis not present

## 2019-12-26 DIAGNOSIS — Z794 Long term (current) use of insulin: Secondary | ICD-10-CM | POA: Insufficient documentation

## 2019-12-26 DIAGNOSIS — Z79899 Other long term (current) drug therapy: Secondary | ICD-10-CM | POA: Diagnosis not present

## 2019-12-26 LAB — CBC WITH DIFFERENTIAL/PLATELET
Abs Immature Granulocytes: 0.02 10*3/uL (ref 0.00–0.07)
Basophils Absolute: 0.1 10*3/uL (ref 0.0–0.1)
Basophils Relative: 1 %
Eosinophils Absolute: 0.6 10*3/uL — ABNORMAL HIGH (ref 0.0–0.5)
Eosinophils Relative: 7 %
HCT: 40.5 % (ref 36.0–46.0)
Hemoglobin: 12.3 g/dL (ref 12.0–15.0)
Immature Granulocytes: 0 %
Lymphocytes Relative: 33 %
Lymphs Abs: 2.6 10*3/uL (ref 0.7–4.0)
MCH: 26.6 pg (ref 26.0–34.0)
MCHC: 30.4 g/dL (ref 30.0–36.0)
MCV: 87.7 fL (ref 80.0–100.0)
Monocytes Absolute: 0.4 10*3/uL (ref 0.1–1.0)
Monocytes Relative: 6 %
Neutro Abs: 4.2 10*3/uL (ref 1.7–7.7)
Neutrophils Relative %: 53 %
Platelets: 372 10*3/uL (ref 150–400)
RBC: 4.62 MIL/uL (ref 3.87–5.11)
RDW: 13.9 % (ref 11.5–15.5)
WBC: 7.9 10*3/uL (ref 4.0–10.5)
nRBC: 0 % (ref 0.0–0.2)

## 2019-12-26 LAB — RESPIRATORY PANEL BY RT PCR (FLU A&B, COVID)
Influenza A by PCR: NEGATIVE
Influenza B by PCR: NEGATIVE
SARS Coronavirus 2 by RT PCR: NEGATIVE

## 2019-12-26 LAB — URINALYSIS, ROUTINE W REFLEX MICROSCOPIC
Bilirubin Urine: NEGATIVE
Glucose, UA: >=500 mg/dL — AB
Hgb urine dipstick: NEGATIVE
Ketones, ur: NEGATIVE mg/dL
Nitrite: NEGATIVE
Protein, ur: NEGATIVE mg/dL
Specific Gravity, Urine: 1.018 (ref 1.005–1.030)
pH: 5 (ref 5.0–8.0)

## 2019-12-26 LAB — HEPATIC FUNCTION PANEL
ALT: 29 U/L (ref 0–44)
AST: 22 U/L (ref 15–41)
Albumin: 3.6 g/dL (ref 3.5–5.0)
Alkaline Phosphatase: 71 U/L (ref 38–126)
Bilirubin, Direct: <0.1 mg/dL (ref 0.0–0.2)
Total Bilirubin: 0.7 mg/dL (ref 0.3–1.2)
Total Protein: 7.2 g/dL (ref 6.5–8.1)

## 2019-12-26 LAB — BASIC METABOLIC PANEL
Anion gap: 8 (ref 5–15)
BUN: 30 mg/dL — ABNORMAL HIGH (ref 8–23)
CO2: 27 mmol/L (ref 22–32)
Calcium: 9.2 mg/dL (ref 8.9–10.3)
Chloride: 103 mmol/L (ref 98–111)
Creatinine, Ser: 1.71 mg/dL — ABNORMAL HIGH (ref 0.44–1.00)
GFR, Estimated: 33 mL/min — ABNORMAL LOW (ref >60)
Glucose, Bld: 264 mg/dL — ABNORMAL HIGH (ref 70–99)
Potassium: 5 mmol/L (ref 3.5–5.1)
Sodium: 138 mmol/L (ref 135–145)

## 2019-12-26 LAB — LACTIC ACID, PLASMA
Lactic Acid, Venous: 1.3 mmol/L (ref 0.5–1.9)
Lactic Acid, Venous: 1.5 mmol/L (ref 0.5–1.9)

## 2019-12-26 MED ORDER — SODIUM CHLORIDE 0.9 % IV BOLUS
500.0000 mL | Freq: Once | INTRAVENOUS | Status: AC
  Administered 2019-12-26: 500 mL via INTRAVENOUS

## 2019-12-26 NOTE — ED Triage Notes (Signed)
Pt started new medication a month ago for DM, pt states she has to void a lot esp during the night. Pt began to experience dizziness and generalized weakness since Sunday.

## 2019-12-26 NOTE — Discharge Instructions (Signed)
Your work-up today was overall reassuring.  Please make sure to follow-up with your primary care doctor about your blood sugars.  Return to the ER for any new or worsening symptoms.

## 2019-12-26 NOTE — ED Provider Notes (Signed)
[facility] EMERGENCY DEPARTMENT Provider Note   CSN: [id number] Arrival date & time: 12/26/19  1251     History Chief Complaint  Patient presents with  . Weakness    Brooke [name] is a 64 y.o. female.  HPI 64 year old female with a history of DM type II, obesity, CKD stage III presents to the ER with complaints of episode of dizziness that happened on Sunday, and feeling overall weak leading up to today.  She denies any fevers, chills, denies any dysuria, nausea, vomiting, abdominal pain.  She is not vaccinated for Covid.  Denies any cough, headache, loss of taste or smell.  She states that she was switched from [place] to [place] approximately a month ago and has been working on controlling her sugars.    Past Medical History:  Diagnosis Date  . Bronchitis    frequent bronchitis  . Diabetes mellitus   . Diabetic nephropathy ([place])   . Hypertension   . Obesity (BMI 35.0-39.9 without comorbidity)   . Vertigo     Patient Active Problem List   Diagnosis Date Noted  . Diabetic nephropathy ([place])   . CKD stage 3 due to type 2 diabetes mellitus ([place]) 03/14/2019  . Type 2 diabetes mellitus without complication, without long-term current use of insulin ([place]) 03/13/2019  . Hyperlipidemia associated with type 2 diabetes mellitus ([place]) 03/13/2019  . Hypertension associated with diabetes ([place]) 03/13/2019  . Obesity (BMI 30-39.9) 03/13/2019  . Chronic bronchitis ([place]) 03/13/2019    Past Surgical History:  Procedure Laterality Date  . BREAST SURGERY Right 1974     OB History    Gravida      Para      Term      Preterm      AB      Living  1     SAB      TAB      Ectopic      Multiple      Live Births              Family History  Problem Relation Age of Onset  . Diabetes Mother   . Congestive Heart Failure Mother   . Kidney disease Father   . Diabetes Sister   . Diabetes Brother   . Hypertension Daughter   . Hypertension Sister   . Cervical  cancer Sister     Social History   Tobacco Use  . Smoking status: Never Smoker  . Smokeless tobacco: Never Used  Vaping Use  . Vaping Use: Never used  Substance Use Topics  . Alcohol use: No  . Drug use: No    Home Medications Prior to Admission medications   Medication Sig Start Date End Date Taking? Authorizing Provider  albuterol (VENTOLIN HFA) 108 (90 Base) MCG/ACT inhaler Inhale 2 puffs into the lungs every 6 (six) hours as needed for wheezing or shortness of breath. 06/26/19  Yes [name] F, FNP  amLODipine (NORVASC) 10 MG tablet Take 1 tablet (10 mg total) by mouth daily. 11/28/19  Yes [name] F, FNP  aspirin EC 81 MG tablet Take 81 mg by mouth daily. Swallow whole.   Yes [provider]  atenolol (TENORMIN) 100 MG tablet Take 1 tablet (100 mg total) by mouth every morning. 11/28/19  Yes [name], FNP  dapagliflozin propanediol (FARXIGA) 10 MG TABS tablet Take 1 tablet (10 mg total) by mouth daily before breakfast. 11/28/19  Yes [name], FNP  glipiZIDE (GLUCOTROL) 10  MG tablet Take 2 tablets (20 mg total) by mouth 2 (two) times daily before a meal. 11/28/19  Yes [name] F, FNP  lisinopril-hydrochlorothiazide (ZESTORETIC) 20-25 MG tablet Take 1 tablet by mouth daily. 11/28/19  Yes [name] F, FNP  lovastatin (MEVACOR) 40 MG tablet Take 1 tablet (40 mg total) by mouth daily at 6 PM. 12/08/19  Yes [name], FNP  metFORMIN (GLUCOPHAGE-XR) 500 MG 24 hr tablet Take 1 tablet (500 mg total) by mouth 2 (two) times daily with a meal. 09/10/19  Yes [name] F, FNP  budesonide-formoterol (SYMBICORT) 160-4.5 MCG/ACT inhaler Inhale 2 puffs into the lungs 2 (two) times daily. Patient not taking: Reported on 12/26/2019 08/26/19   [name], FNP  Dulaglutide (TRULICITY) 4.5 XI/3.3AS SOPN Inject 4.5 mg as directed once a week. 08/01/19   [name], FNP    Allergies    Patient has no known allergies.  Review of Systems     Review of Systems  Constitutional: Negative for chills and fever.  HENT: Negative for ear pain and sore throat.   Eyes: Negative for pain and visual disturbance.  Respiratory: Negative for cough and shortness of breath.   Cardiovascular: Negative for chest pain and palpitations.  Gastrointestinal: Negative for abdominal pain and vomiting.  Genitourinary: Negative for dysuria and hematuria.  Musculoskeletal: Negative for arthralgias and back pain.  Skin: Negative for color change and rash.  Neurological: Positive for weakness. Negative for seizures and syncope.  All other systems reviewed and are negative.   Physical Exam Updated Vital Signs BP 128/75   Pulse 68   Temp 98.2 F (36.8 C) (Oral)   Resp (!) 21   Ht 5\' 5"  (1.651 m)   Wt 85.3 kg   LMP 03/16/2012   SpO2 97%   BMI 31.28 kg/m   Physical Exam Vitals and nursing note reviewed.  Constitutional:      General: She is not in acute distress.    Appearance: She is well-developed. She is not ill-appearing, toxic-appearing or diaphoretic.  HENT:     Head: Normocephalic and atraumatic.     Mouth/Throat:     Mouth: Mucous membranes are moist.     Pharynx: Oropharynx is clear.  Eyes:     Conjunctiva/sclera: Conjunctivae normal.  Cardiovascular:     Rate and Rhythm: Normal rate and regular rhythm.     Heart sounds: No murmur heard.   Pulmonary:     Effort: Pulmonary effort is normal. No respiratory distress.     Breath sounds: Normal breath sounds.  Abdominal:     General: Abdomen is flat.     Palpations: Abdomen is soft.     Tenderness: There is no abdominal tenderness.  Musculoskeletal:        General: Normal range of motion.     Cervical back: Normal range of motion and neck supple.     Right lower leg: No edema.     Left lower leg: No edema.  Skin:    General: Skin is warm and dry.     Findings: No erythema.  Neurological:     General: No focal deficit present.     Mental Status: She is alert and oriented  to person, place, and time.     Sensory: No sensory deficit.     Motor: No weakness.  Psychiatric:        Mood and Affect: Mood normal.        Behavior: Behavior normal.  ED Results / Procedures / Treatments   Labs (all labs ordered are listed, but only abnormal results are displayed) Labs Reviewed  CBC WITH DIFFERENTIAL/PLATELET - Abnormal; Notable for the following components:      Result Value   Eosinophils Absolute 0.6 (*)    All other components within normal limits  BASIC METABOLIC PANEL - Abnormal; Notable for the following components:   Glucose, Bld 264 (*)    BUN 30 (*)    Creatinine, Ser 1.71 (*)    GFR, Estimated 33 (*)    All other components within normal limits  URINALYSIS, ROUTINE W REFLEX MICROSCOPIC - Abnormal; Notable for the following components:   APPearance HAZY (*)    Glucose, UA >=500 (*)    Leukocytes,Ua TRACE (*)    Bacteria, UA MANY (*)    All other components within normal limits  RESPIRATORY PANEL BY RT PCR (FLU A&B, COVID)  URINE CULTURE  HEPATIC FUNCTION PANEL  LACTIC ACID, PLASMA  LACTIC ACID, PLASMA    EKG EKG Interpretation  Date/Time:  Thursday December 26 2019 14:17:57 EDT Ventricular Rate:  70 PR Interval:    QRS Duration: 92 QT Interval:  400 QTC Calculation: 432 R Axis:   42 Text Interpretation: Sinus rhythm Borderline low voltage, extremity leads No significant change since last tracing Confirmed by Dorie Rank [phone]) on 12/26/2019 2:45:43 PM   Radiology No results found.  Procedures Procedures (including critical care time)  Medications Ordered in ED Medications  sodium chloride 0.9 % bolus 500 mL (500 mLs Intravenous New Bag/Given 12/26/19 1744)    ED Course  I have reviewed the triage vital signs and the nursing notes.  Pertinent labs & imaging results that were available during my care of the patient were reviewed by me and considered in my medical decision making (see chart for details).    MDM  Rules/Calculators/A&P                         64 year old female complains of weakness over the last several days. Vitals overall reassuring, patient is afebrile, not hypotensive, hypoxic or tachypneic.  Physical exam is benign abdomen is soft and nontender, lungs clear, no lower extremity swelling.  Her CBC is without any abnormalities.  BMP with normal electrolytes, glucose of 264, creatinine slightly elevated from baseline, BUN of 30, possibly due to dehydration.  Normal anion gap.  UA with more than 500 of glucose, hazy, trace leukocytes and many bacteria.  Patient denies any urinary symptoms at this time.  No ketones.  Sent for culture.  Overall work-up reassuring.  No evidence of sepsis, electrolyte abnormalities, significant renal dysfunction.  Suspect her weakness could be secondary to poorly controlled blood sugars.  No evidence of DKA.  Patient received 500 cc bolus of fluids and reports feeling better.  Requesting to go home.  She does have pending follow-up with her PCP.  Return precautions discussed.  He was understanding and is agreeable.  At this stage in the ED course, the patient is medically screened and stable for discharge. Final Clinical Impression(s) / ED Diagnoses Final diagnoses:  Weakness    Rx / DC Orders ED Discharge Orders    None       Lyndel Safe 12/26/19 Jana Half, MD 12/27/19 [phone]

## 2019-12-29 LAB — URINE CULTURE: Culture: >=100000 — AB

## 2019-12-30 ENCOUNTER — Telehealth: Payer: Self-pay | Admitting: *Deleted

## 2019-12-30 NOTE — Progress Notes (Signed)
ED Antimicrobial Stewardship Positive Culture Follow Up   Brooke [name] is an 64 y.o. female who presented to [facility] on 12/26/2019 with a chief complaint of  Chief Complaint  Patient presents with   Weakness    Recent Results (from the past 720 hour(s))  Urine culture     Status: Abnormal   Collection Time: 12/26/19  1:17 PM   Specimen: Urine, Random  Result Value Ref Range Status   Specimen Description   Final    URINE, RANDOM Performed at [facility], [address] [place], [place] [address]    Special Requests   Final    NONE Performed at [facility], [address], [place], [place] [address]    Culture >=100,000 COLONIES/mL KLEBSIELLA PNEUMONIAE (A)  Final   Report Status 12/29/2019 FINAL  Final   Organism ID, Bacteria KLEBSIELLA PNEUMONIAE (A)  Final      Susceptibility   Klebsiella pneumoniae - MIC*    AMPICILLIN >=32 RESISTANT Resistant     CEFAZOLIN <=4 SENSITIVE Sensitive     CEFTRIAXONE <=0.25 SENSITIVE Sensitive     CIPROFLOXACIN <=0.25 SENSITIVE Sensitive     GENTAMICIN <=1 SENSITIVE Sensitive     IMIPENEM <=0.25 SENSITIVE Sensitive     NITROFURANTOIN 128 RESISTANT Resistant     TRIMETH/SULFA <=20 SENSITIVE Sensitive     AMPICILLIN/SULBACTAM 4 SENSITIVE Sensitive     PIP/TAZO <=4 SENSITIVE Sensitive     * >=100,000 COLONIES/mL KLEBSIELLA PNEUMONIAE  Respiratory Panel by RT PCR (Flu A&B, Covid) - Nasopharyngeal Swab     Status: None   Collection Time: 12/26/19  2:03 PM   Specimen: Nasopharyngeal Swab  Result Value Ref Range Status   SARS Coronavirus 2 by RT PCR NEGATIVE NEGATIVE Final    Comment: (NOTE) SARS-CoV-2 target nucleic acids are NOT DETECTED.  The SARS-CoV-2 RNA is generally detectable in upper respiratoy specimens during the acute phase of infection. The lowest concentration of SARS-CoV-2 viral copies this assay can detect is 131 copies/mL. A negative result does not preclude SARS-Cov-2 infection and should not be used as the sole  basis for treatment or other patient management decisions. A negative result may occur with  improper specimen collection/handling, submission of specimen other than nasopharyngeal swab, presence of viral mutation(s) within the areas targeted by this assay, and inadequate number of viral copies (<131 copies/mL). A negative result must be combined with clinical observations, patient history, and epidemiological information. The expected result is Negative.  Fact Sheet for Patients:  PinkCheek.be  Fact Sheet for Healthcare Providers:  GravelBags.it  This test is no t yet approved or cleared by the [place] FDA and  has been authorized for detection and/or diagnosis of SARS-CoV-2 by FDA under an Emergency Use Authorization (EUA). This EUA will remain  in effect (meaning this test can be used) for the duration of the COVID-19 declaration under Section 564(b)(1) of the Act, 21 U.S.C. section 360bbb-3(b)(1), unless the authorization is terminated or revoked sooner.     Influenza A by PCR NEGATIVE NEGATIVE Final   Influenza B by PCR NEGATIVE NEGATIVE Final    Comment: (NOTE) The Xpert Xpress SARS-CoV-2/FLU/RSV assay is intended as an aid in  the diagnosis of influenza from Nasopharyngeal swab specimens and  should not be used as a sole basis for treatment. Nasal washings and  aspirates are unacceptable for Xpert Xpress SARS-CoV-2/FLU/RSV  testing.  Fact Sheet for Patients: PinkCheek.be  Fact Sheet for Healthcare Providers: GravelBags.it  This test is not yet approved or  cleared by the [place] and  has been authorized for detection and/or diagnosis of SARS-CoV-2 by  FDA under an Emergency Use Authorization (EUA). This EUA will remain  in effect (meaning this test can be used) for the duration of the  Covid-19 declaration under Section 564(b)(1) of the  Act, 21  U.S.C. section 360bbb-3(b)(1), unless the authorization is  terminated or revoked. Performed at [facility], [address] [place], [place] [address]    [x]  Patient discharged originally without antimicrobial agent and treatment is now indicated  New antibiotic prescription: Flow Manager to call patient for symptom check. If patient not experiencing systemic or urinary symptoms (dysuria, frequency, fever, chills, etc.), then no further treatment indicated. If patient experiencing symptoms, start cephalexin 500mg  PO q6h x 5 days.  ED Provider: Blanchie Dessert, MD   Brooke [name] 12/30/2019, 8:03 AM Clinical Pharmacist Monday - Friday phone -  [phone] Saturday - Sunday phone - [phone]

## 2019-12-31 ENCOUNTER — Ambulatory Visit: Payer: Medicare HMO | Admitting: Family Medicine

## 2020-01-02 ENCOUNTER — Encounter: Payer: Medicare HMO | Admitting: Nurse Practitioner

## 2020-01-07 ENCOUNTER — Encounter: Payer: Self-pay | Admitting: Nurse Practitioner

## 2020-01-07 ENCOUNTER — Ambulatory Visit (INDEPENDENT_AMBULATORY_CARE_PROVIDER_SITE_OTHER): Payer: Medicare HMO | Admitting: Nurse Practitioner

## 2020-01-07 ENCOUNTER — Other Ambulatory Visit: Payer: Self-pay

## 2020-01-07 DIAGNOSIS — E119 Type 2 diabetes mellitus without complications: Secondary | ICD-10-CM

## 2020-01-07 NOTE — Progress Notes (Signed)
Established Patient Office Visit  Subjective:  Patient ID: Brooke [name], female    DOB: 1955-04-16  Age: 64 y.o. MRN: [id number]  CC:  Chief Complaint  Patient presents with   Diabetes    4 week follow up- Patient states that she stopped her Wilder Glade due to side effects and went back on her Januvia    HPI Brooke [name] presents for The patient presents with history of  type 2 diabetes mellitus without complications. Patient was diagnosed in 03/13/2019. Compliance with treatment has been good; the patient takes medication as directed , maintains a diabetic diet and an exercise regimen , follows up as directed , and is keeping a glucose diary. Sugars runs 150-200 patient specifically denies associated symptoms, including blurred vision, fatigue, polydipsia, polyphagia and polyuria . Patient denies hypoglycemia. In regard to preventative care, the patient performs foot self-exams daily and last ophthalmology exam was in 2020.  Current medication Januvia 50 mg tablet, Metformin 500 mg 1 tablet twice daily.  Trulicity 4.5 mg subcutaneous once a week.  Past Medical History:  Diagnosis Date   Bronchitis    frequent bronchitis   Diabetes mellitus    Diabetic nephropathy (HCC)    Hypertension    Obesity (BMI 35.0-39.9 without comorbidity)    Vertigo     Past Surgical History:  Procedure Laterality Date   BREAST SURGERY Right 1974    Family History  Problem Relation Age of Onset   Diabetes Mother    Congestive Heart Failure Mother    Kidney disease Father    Diabetes Sister    Diabetes Brother    Hypertension Daughter    Hypertension Sister    Cervical cancer Sister     Social History   Socioeconomic History   Marital status: Married    Spouse name: Donnie   Number of children: 1   Years of education: Not on file   Highest education level: Not on file  Occupational History   Not on file  Tobacco Use   Smoking status: Never Smoker   Smokeless  tobacco: Never Used  Scientific laboratory technician Use: Never used  Substance and Sexual Activity   Alcohol use: No   Drug use: No   Sexual activity: Never  Other Topics Concern   Not on file  Social History Narrative   Not on file   Social Determinants of Health   Financial Resource Strain:    Difficulty of Paying Living Expenses: Not on file  Food Insecurity:    Worried About Charity fundraiser in the Last Year: Not on file   [employer] of Food in the Last Year: Not on file  Transportation Needs:    Lack of Transportation (Medical): Not on file   Lack of Transportation (Non-Medical): Not on file  Physical Activity:    Days of Exercise per Week: Not on file   Minutes of Exercise per Session: Not on file  Stress:    Feeling of Stress : Not on file  Social Connections:    Frequency of Communication with Friends and Family: Not on file   Frequency of Social Gatherings with Friends and Family: Not on file   Attends Religious Services: Not on file   Active Member of Clubs or Organizations: Not on file   Attends Archivist Meetings: Not on file   Marital Status: Not on file  Intimate Partner Violence:    Fear of Current or Ex-Partner: Not on file   Emotionally  Abused: Not on file   Physically Abused: Not on file   Sexually Abused: Not on file    Outpatient Medications Prior to Visit  Medication Sig Dispense Refill   albuterol (VENTOLIN HFA) 108 (90 Base) MCG/ACT inhaler Inhale 2 puffs into the lungs every 6 (six) hours as needed for wheezing or shortness of breath. 18 g 2   amLODipine (NORVASC) 10 MG tablet Take 1 tablet (10 mg total) by mouth daily. 90 tablet 1   aspirin EC 81 MG tablet Take 81 mg by mouth daily. Swallow whole.     atenolol (TENORMIN) 100 MG tablet Take 1 tablet (100 mg total) by mouth every morning. 90 tablet 1   budesonide-formoterol (SYMBICORT) 160-4.5 MCG/ACT inhaler Inhale 2 puffs into the lungs 2 (two) times daily. 1 Inhaler  2   Dulaglutide (TRULICITY) 4.5 OV/5.6EP SOPN Inject 4.5 mg as directed once a week. 6 mL 1   glipiZIDE (GLUCOTROL) 10 MG tablet Take 2 tablets (20 mg total) by mouth 2 (two) times daily before a meal. 360 tablet 1   lisinopril-hydrochlorothiazide (ZESTORETIC) 20-25 MG tablet Take 1 tablet by mouth daily. 90 tablet 1   lovastatin (MEVACOR) 40 MG tablet Take 1 tablet (40 mg total) by mouth daily at 6 PM. 30 tablet 2   metFORMIN (GLUCOPHAGE-XR) 500 MG 24 hr tablet Take 1 tablet (500 mg total) by mouth 2 (two) times daily with a meal. 180 tablet 1   sitaGLIPtin (JANUVIA) 50 MG tablet Take 50 mg by mouth daily.     dapagliflozin propanediol (FARXIGA) 10 MG TABS tablet Take 1 tablet (10 mg total) by mouth daily before breakfast. 30 tablet 2   sitaGLIPtin (JANUVIA) 100 MG tablet Take 100 mg by mouth daily.     No facility-administered medications prior to visit.    No Known Allergies  ROS Review of Systems  Neurological: Negative for light-headedness and headaches.  All other systems reviewed and are negative.     Objective:    Physical Exam Constitutional:      Appearance: Normal appearance.  HENT:     Head: Normocephalic.  Eyes:     Conjunctiva/sclera: Conjunctivae normal.  Cardiovascular:     Rate and Rhythm: Normal rate and regular rhythm.     Pulses: Normal pulses.     Heart sounds: Normal heart sounds.  Pulmonary:     Effort: Pulmonary effort is normal.     Breath sounds: Normal breath sounds.  Abdominal:     General: Bowel sounds are normal.  Musculoskeletal:        General: Normal range of motion.  Skin:    General: Skin is warm.  Neurological:     Mental Status: She is alert and oriented to person, place, and time.  Psychiatric:        Mood and Affect: Mood normal.        Behavior: Behavior normal.     BP (!) 147/74    Pulse 66    Temp 97.9 F (36.6 C) (Temporal)    Ht 5\' 5"  (1.651 m)    Wt 201 lb 9.6 oz (91.4 kg)    LMP 03/16/2012    SpO2 98%    BMI  33.55 kg/m  Wt Readings from Last 3 Encounters:  01/07/20 201 lb 9.6 oz (91.4 kg)  12/26/19 188 lb (85.3 kg)  11/28/19 198 lb 9.6 oz (90.1 kg)     There are no preventive care reminders to display for this patient.  There are no  preventive care reminders to display for this patient.  Lab Results  Component Value Date   TSH 1.880 03/13/2019   Lab Results  Component Value Date   WBC 7.9 12/26/2019   HGB 12.3 12/26/2019   HCT 40.5 12/26/2019   MCV 87.7 12/26/2019   PLT 372 12/26/2019   Lab Results  Component Value Date   NA 138 12/26/2019   K 5.0 12/26/2019   CO2 27 12/26/2019   GLUCOSE 264 (H) 12/26/2019   BUN 30 (H) 12/26/2019   CREATININE 1.71 (H) 12/26/2019   BILITOT 0.7 12/26/2019   ALKPHOS 71 12/26/2019   AST 22 12/26/2019   ALT 29 12/26/2019   PROT 7.2 12/26/2019   ALBUMIN 3.6 12/26/2019   CALCIUM 9.2 12/26/2019   ANIONGAP 8 12/26/2019   Lab Results  Component Value Date   CHOL 210 (H) 11/28/2019   Lab Results  Component Value Date   HDL 42 11/28/2019   Lab Results  Component Value Date   LDLCALC 128 (H) 11/28/2019   Lab Results  Component Value Date   TRIG 223 (H) 11/28/2019   Lab Results  Component Value Date   CHOLHDL 5.0 (H) 11/28/2019   Lab Results  Component Value Date   HGBA1C 11.9 (H) 11/28/2019      Assessment & Plan:   Problem List Items Addressed This Visit      Endocrine   Type 2 diabetes mellitus without complication, without long-term current use of insulin ([place])    Diabetes mellitus well controlled on current medication no changes necessary.  Continue to maintain a healthy diet and exercise regimen.  Provided education with printed handouts given. Follow-up in 3 months.      Relevant Medications   sitaGLIPtin (JANUVIA) 50 MG tablet        Follow-up: Return in about 3 months (around 04/08/2020).    Ivy Lynn, NP

## 2020-01-07 NOTE — Patient Instructions (Addendum)
Continue to  maintain healthy diet and exercise, monitor Blood glucose, follow up with uncontrolled or worsening symptoms. Follow up in 3 months     Calorie Counting for Weight Loss Calories are units of energy. Your body needs a certain amount of calories from food to keep you going throughout the day. When you eat more calories than your body needs, your body stores the extra calories as fat. When you eat fewer calories than your body needs, your body burns fat to get the energy it needs. Calorie counting means keeping track of how many calories you eat and drink each day. Calorie counting can be helpful if you need to lose weight. If you make sure to eat fewer calories than your body needs, you should lose weight. Ask your health care provider what a healthy weight is for you. For calorie counting to work, you will need to eat the right number of calories in a day in order to lose a healthy amount of weight per week. A dietitian can help you determine how many calories you need in a day and will give you suggestions on how to reach your calorie goal.  A healthy amount of weight to lose per week is usually 1-2 lb (0.5-0.9 kg). This usually means that your daily calorie intake should be reduced by 500-750 calories.  Eating 1,200 - 1,500 calories per day can help most women lose weight.  Eating 1,500 - 1,800 calories per day can help most men lose weight. What is my plan? My goal is to have __________ calories per day. If I have this many calories per day, I should lose around __________ pounds per week. What do I need to know about calorie counting? In order to meet your daily calorie goal, you will need to:  Find out how many calories are in each food you would like to eat. Try to do this before you eat.  Decide how much of the food you plan to eat.  Write down what you ate and how many calories it had. Doing this is called keeping a food log. To successfully lose weight, it is important  to balance calorie counting with a healthy lifestyle that includes regular activity. Aim for 150 minutes of moderate exercise (such as walking) or 75 minutes of vigorous exercise (such as running) each week. Where do I find calorie information?  The number of calories in a food can be found on a Nutrition Facts label. If a food does not have a Nutrition Facts label, try to look up the calories online or ask your dietitian for help. Remember that calories are listed per serving. If you choose to have more than one serving of a food, you will have to multiply the calories per serving by the amount of servings you plan to eat. For example, the label on a package of bread might say that a serving size is 1 slice and that there are 90 calories in a serving. If you eat 1 slice, you will have eaten 90 calories. If you eat 2 slices, you will have eaten 180 calories. How do I keep a food log? Immediately after each meal, record the following information in your food log:  What you ate. Don't forget to include toppings, sauces, and other extras on the food.  How much you ate. This can be measured in cups, ounces, or number of items.  How many calories each food and drink had.  The total number of calories in the  meal. Keep your food log near you, such as in a small notebook in your pocket, or use a mobile app or website. Some programs will calculate calories for you and show you how many calories you have left for the day to meet your goal. What are some calorie counting tips?   Use your calories on foods and drinks that will fill you up and not leave you hungry: ? Some examples of foods that fill you up are nuts and nut butters, vegetables, lean proteins, and high-fiber foods like whole grains. High-fiber foods are foods with more than 5 g fiber per serving. ? Drinks such as sodas, specialty coffee drinks, alcohol, and juices have a lot of calories, yet do not fill you up.  Eat nutritious foods and  avoid empty calories. Empty calories are calories you get from foods or beverages that do not have many vitamins or protein, such as candy, sweets, and soda. It is better to have a nutritious high-calorie food (such as an avocado) than a food with few nutrients (such as a bag of chips).  Know how many calories are in the foods you eat most often. This will help you calculate calorie counts faster.  Pay attention to calories in drinks. Low-calorie drinks include water and unsweetened drinks.  Pay attention to nutrition labels for "low fat" or "fat free" foods. These foods sometimes have the same amount of calories or more calories than the full fat versions. They also often have added sugar, starch, or salt, to make up for flavor that was removed with the fat.  Find a way of tracking calories that works for you. Get creative. Try different apps or programs if writing down calories does not work for you. What are some portion control tips?  Know how many calories are in a serving. This will help you know how many servings of a certain food you can have.  Use a measuring cup to measure serving sizes. You could also try weighing out portions on a kitchen scale. With time, you will be able to estimate serving sizes for some foods.  Take some time to put servings of different foods on your favorite plates, bowls, and cups so you know what a serving looks like.  Try not to eat straight from a bag or box. Doing this can lead to overeating. Put the amount you would like to eat in a cup or on a plate to make sure you are eating the right portion.  Use smaller plates, glasses, and bowls to prevent overeating.  Try not to multitask (for example, watch TV or use your computer) while eating. If it is time to eat, sit down at a table and enjoy your food. This will help you to know when you are full. It will also help you to be aware of what you are eating and how much you are eating. What are tips for  following this plan? Reading food labels  Check the calorie count compared to the serving size. The serving size may be smaller than what you are used to eating.  Check the source of the calories. Make sure the food you are eating is high in vitamins and protein and low in saturated and trans fats. Shopping  Read nutrition labels while you shop. This will help you make healthy decisions before you decide to purchase your food.  Make a grocery list and stick to it. Cooking  Try to cook your favorite foods in a healthier way.  For example, try baking instead of frying.  Use low-fat dairy products. Meal planning  Use more fruits and vegetables. Half of your plate should be fruits and vegetables.  Include lean proteins like poultry and fish. How do I count calories when eating out?  Ask for smaller portion sizes.  Consider sharing an entree and sides instead of getting your own entree.  If you get your own entree, eat only half. Ask for a box at the beginning of your meal and put the rest of your entree in it so you are not tempted to eat it.  If calories are listed on the menu, choose the lower calorie options.  Choose dishes that include vegetables, fruits, whole grains, low-fat dairy products, and lean protein.  Choose items that are boiled, broiled, grilled, or steamed. Stay away from items that are buttered, battered, fried, or served with cream sauce. Items labeled "crispy" are usually fried, unless stated otherwise.  Choose water, low-fat milk, unsweetened iced tea, or other drinks without added sugar. If you want an alcoholic beverage, choose a lower calorie option such as a glass of wine or light beer.  Ask for dressings, sauces, and syrups on the side. These are usually high in calories, so you should limit the amount you eat.  If you want a salad, choose a garden salad and ask for grilled meats. Avoid extra toppings like bacon, cheese, or fried items. Ask for the dressing  on the side, or ask for olive oil and vinegar or lemon to use as dressing.  Estimate how many servings of a food you are given. For example, a serving of cooked rice is  cup or about the size of half a baseball. Knowing serving sizes will help you be aware of how much food you are eating at restaurants. The list below tells you how big or small some common portion sizes are based on everyday objects: ? 1 oz--4 stacked dice. ? 3 oz--1 deck of cards. ? 1 tsp--1 die. ? 1 Tbsp-- a ping-pong ball. ? 2 Tbsp--1 ping-pong ball. ?  cup-- baseball. ? 1 cup--1 baseball. Summary  Calorie counting means keeping track of how many calories you eat and drink each day. If you eat fewer calories than your body needs, you should lose weight.  A healthy amount of weight to lose per week is usually 1-2 lb (0.5-0.9 kg). This usually means reducing your daily calorie intake by 500-750 calories.  The number of calories in a food can be found on a Nutrition Facts label. If a food does not have a Nutrition Facts label, try to look up the calories online or ask your dietitian for help.  Use your calories on foods and drinks that will fill you up, and not on foods and drinks that will leave you hungry.  Use smaller plates, glasses, and bowls to prevent overeating. This information is not intended to replace advice given to you by your health care provider. Make sure you discuss any questions you have with your health care provider. Document Revised: 11/10/2017 Document Reviewed: 01/22/2016 Elsevier Patient Education  [place].    Diabetes Basics  Diabetes (diabetes mellitus) is a long-term (chronic) disease. It occurs when the body does not properly use sugar (glucose) that is released from food after you eat. Diabetes may be caused by one or both of these problems:  Your pancreas does not make enough of a hormone called insulin.  Your body does not react in a normal  way to insulin that it  makes. Insulin lets sugars (glucose) go into cells in your body. This gives you energy. If you have diabetes, sugars cannot get into cells. This causes high blood sugar (hyperglycemia). Follow these instructions at home: How is diabetes treated? You may need to take insulin or other diabetes medicines daily to keep your blood sugar in balance. Take your diabetes medicines every day as told by your doctor. List your diabetes medicines here: Diabetes medicines  Name of medicine: ______________________________ ? Amount (dose): _______________ Time (a.m./p.m.): _______________ Notes: ___________________________________  Name of medicine: ______________________________ ? Amount (dose): _______________ Time (a.m./p.m.): _______________ Notes: ___________________________________  Name of medicine: ______________________________ ? Amount (dose): _______________ Time (a.m./p.m.): _______________ Notes: ___________________________________ If you use insulin, you will learn how to give yourself insulin by injection. You may need to adjust the amount based on the food that you eat. List the types of insulin you use here: Insulin  Insulin type: ______________________________ ? Amount (dose): _______________ Time (a.m./p.m.): _______________ Notes: ___________________________________  Insulin type: ______________________________ ? Amount (dose): _______________ Time (a.m./p.m.): _______________ Notes: ___________________________________  Insulin type: ______________________________ ? Amount (dose): _______________ Time (a.m./p.m.): _______________ Notes: ___________________________________  Insulin type: ______________________________ ? Amount (dose): _______________ Time (a.m./p.m.): _______________ Notes: ___________________________________  Insulin type: ______________________________ ? Amount (dose): _______________ Time (a.m./p.m.): _______________ Notes:  ___________________________________ How do I manage my blood sugar?  Check your blood sugar levels using a blood glucose monitor as directed by your doctor. Your doctor will set treatment goals for you. Generally, you should have these blood sugar levels:  Before meals (preprandial): 80-130 mg/dL (4.4-7.2 mmol/L).  After meals (postprandial): below 180 mg/dL (10 mmol/L).  A1c level: less than 7%. Write down the times that you will check your blood sugar levels: Blood sugar checks  Time: _______________ Notes: ___________________________________  Time: _______________ Notes: ___________________________________  Time: _______________ Notes: ___________________________________  Time: _______________ Notes: ___________________________________  Time: _______________ Notes: ___________________________________  Time: _______________ Notes: ___________________________________  What do I need to know about low blood sugar? Low blood sugar is called hypoglycemia. This is when blood sugar is at or below 70 mg/dL (3.9 mmol/L). Symptoms may include:  Feeling: ? Hungry. ? Worried or nervous (anxious). ? Sweaty and clammy. ? Confused. ? Dizzy. ? Sleepy. ? Sick to your stomach (nauseous).  Having: ? A fast heartbeat. ? A headache. ? A change in your vision. ? Tingling or no feeling (numbness) around the mouth, lips, or tongue. ? Jerky movements that you cannot control (seizure).  Having trouble with: ? Moving (coordination). ? Sleeping. ? Passing out (fainting). ? Getting upset easily (irritability). Treating low blood sugar To treat low blood sugar, eat or drink something sugary right away. If you can think clearly and swallow safely, follow the 15:15 rule:  Take 15 grams of a fast-acting carb (carbohydrate). Talk with your doctor about how much you should take.  Some fast-acting carbs are: ? Sugar tablets (glucose pills). Take 3-4 glucose pills. ? 6-8 pieces of hard  candy. ? 4-6 oz (120-150 mL) of fruit juice. ? 4-6 oz (120-150 mL) of regular (not diet) soda. ? 1 Tbsp (15 mL) honey or sugar.  Check your blood sugar 15 minutes after you take the carb.  If your blood sugar is still at or below 70 mg/dL (3.9 mmol/L), take 15 grams of a carb again.  If your blood sugar does not go above 70 mg/dL (3.9 mmol/L) after 3 tries, get help right away.  After your blood sugar goes back to normal,  eat a meal or a snack within 1 hour. Treating very low blood sugar If your blood sugar is at or below 54 mg/dL (3 mmol/L), you have very low blood sugar (severe hypoglycemia). This is an emergency. Do not wait to see if the symptoms will go away. Get medical help right away. Call your local emergency services (911 in the [place] Do not drive yourself to the hospital. Questions to ask your health care provider  Do I need to meet with a diabetes educator?  What equipment will I need to care for myself at home?  What diabetes medicines do I need? When should I take them?  How often do I need to check my blood sugar?  What number can I call if I have questions?  When is my next doctor's visit?  Where can I find a support group for people with diabetes? Where to find more information  American Diabetes Association: www.diabetes.org  American Association of Diabetes Educators: www.diabeteseducator.org/patient-resources Contact a doctor if:  Your blood sugar is at or above 240 mg/dL (13.3 mmol/L) for 2 days in a row.  You have been sick or have had a fever for 2 days or more, and you are not getting better.  You have any of these problems for more than 6 hours: ? You cannot eat or drink. ? You feel sick to your stomach (nauseous). ? You throw up (vomit). ? You have watery poop (diarrhea). Get help right away if:  Your blood sugar is lower than 54 mg/dL (3 mmol/L).  You get confused.  You have trouble: ? Thinking clearly. ? Breathing. Summary  Diabetes  (diabetes mellitus) is a long-term (chronic) disease. It occurs when the body does not properly use sugar (glucose) that is released from food after digestion.  Take insulin and diabetes medicines as told.  Check your blood sugar every day, as often as told.  Keep all follow-up visits as told by your doctor. This is important. This information is not intended to replace advice given to you by your health care provider. Make sure you discuss any questions you have with your health care provider. Document Revised: 11/14/2018 Document Reviewed: 05/26/2017 Elsevier Patient Education  [place].

## 2020-01-07 NOTE — Assessment & Plan Note (Signed)
Diabetes mellitus well controlled on current medication no changes necessary.  Continue to maintain a healthy diet and exercise regimen.  Provided education with printed handouts given. Follow-up in 3 months.

## 2020-01-13 ENCOUNTER — Ambulatory Visit (INDEPENDENT_AMBULATORY_CARE_PROVIDER_SITE_OTHER): Payer: Medicare HMO | Admitting: Nurse Practitioner

## 2020-01-13 ENCOUNTER — Encounter: Payer: Self-pay | Admitting: Nurse Practitioner

## 2020-01-13 ENCOUNTER — Ambulatory Visit (INDEPENDENT_AMBULATORY_CARE_PROVIDER_SITE_OTHER): Payer: Medicare HMO

## 2020-01-13 ENCOUNTER — Other Ambulatory Visit: Payer: Self-pay

## 2020-01-13 VITALS — BP 129/70 | HR 79 | Temp 97.0°F | Ht 65.0 in | Wt 200.4 lb

## 2020-01-13 DIAGNOSIS — Z780 Asymptomatic menopausal state: Secondary | ICD-10-CM

## 2020-01-13 DIAGNOSIS — Z0000 Encounter for general adult medical examination without abnormal findings: Secondary | ICD-10-CM | POA: Insufficient documentation

## 2020-01-13 NOTE — Patient Instructions (Signed)
Ms. Doerr , Thank you for taking time to come for your Medicare Wellness Visit. I appreciate your ongoing commitment to your health goals. Please review the following plan we discussed and let me know if I can assist you in the future.   These are the goals we discussed: Goals   None     This is a list of the screening recommended for you and due dates:  Health Maintenance  Topic Date Due  . Eye exam for diabetics  Never done  .  Hepatitis C: One time screening is recommended by Center for Disease Control  (CDC) for  adults born from [birth year] through 1967.   03/12/2020*  . HIV Screening  03/12/2020*  . Mammogram  08/29/2020*  . DEXA scan (bone density measurement)  08/29/2020*  . Colon Cancer Screening  08/29/2020*  . Tetanus Vaccine  08/29/2020*  . Pneumonia vaccines (1 of 2 - PCV13) 08/29/2020*  . Complete foot exam   03/12/2020  . Hemoglobin A1C  05/27/2020  . Flu Shot  Completed  . COVID-19 Vaccine  Completed  . Pap Smear  Discontinued  *Topic was postponed. The date shown is not the original due date.      Health Maintenance After Age 44 After age 74, you are at a higher risk for certain long-term diseases and infections as well as injuries from falls. Falls are a major cause of broken bones and head injuries in people who are older than age 26. Getting regular preventive care can help to keep you healthy and well. Preventive care includes getting regular testing and making lifestyle changes as recommended by your health care provider. Talk with your health care provider about:  Which screenings and tests you should have. A screening is a test that checks for a disease when you have no symptoms.  A diet and exercise plan that is right for you. What should I know about screenings and tests to prevent falls? Screening and testing are the best ways to find a health problem early. Early diagnosis and treatment give you the best chance of managing medical conditions that are  common after age 24. Certain conditions and lifestyle choices may make you more likely to have a fall. Your health care provider may recommend:  Regular vision checks. Poor vision and conditions such as cataracts can make you more likely to have a fall. If you wear glasses, make sure to get your prescription updated if your vision changes.  Medicine review. Work with your health care provider to regularly review all of the medicines you are taking, including over-the-counter medicines. Ask your health care provider about any side effects that may make you more likely to have a fall. Tell your health care provider if any medicines that you take make you feel dizzy or sleepy.  Osteoporosis screening. Osteoporosis is a condition that causes the bones to get weaker. This can make the bones weak and cause them to break more easily.  Blood pressure screening. Blood pressure changes and medicines to control blood pressure can make you feel dizzy.  Strength and balance checks. Your health care provider may recommend certain tests to check your strength and balance while standing, walking, or changing positions.  Foot health exam. Foot pain and numbness, as well as not wearing proper footwear, can make you more likely to have a fall.  Depression screening. You may be more likely to have a fall if you have a fear of falling, feel emotionally low, or feel  unable to do activities that you used to do.  Alcohol use screening. Using too much alcohol can affect your balance and may make you more likely to have a fall. What actions can I take to lower my risk of falls? General instructions  Talk with your health care provider about your risks for falling. Tell your health care provider if: ? You fall. Be sure to tell your health care provider about all falls, even ones that seem minor. ? You feel dizzy, sleepy, or off-balance.  Take over-the-counter and prescription medicines only as told by your health care  provider. These include any supplements.  Eat a healthy diet and maintain a healthy weight. A healthy diet includes low-fat dairy products, low-fat (lean) meats, and fiber from whole grains, beans, and lots of fruits and vegetables. Home safety  Remove any tripping hazards, such as rugs, cords, and clutter.  Install safety equipment such as grab bars in bathrooms and safety rails on stairs.  Keep rooms and walkways well-lit. Activity   Follow a regular exercise program to stay fit. This will help you maintain your balance. Ask your health care provider what types of exercise are appropriate for you.  If you need a cane or walker, use it as recommended by your health care provider.  Wear supportive shoes that have nonskid soles. Lifestyle  Do not drink alcohol if your health care provider tells you not to drink.  If you drink alcohol, limit how much you have: ? 0-1 drink a day for women. ? 0-2 drinks a day for men.  Be aware of how much alcohol is in your drink. In the [place] one drink equals one typical bottle of beer (12 oz), one-half glass of wine (5 oz), or one shot of hard liquor (1 oz).  Do not use any products that contain nicotine or tobacco, such as cigarettes and e-cigarettes. If you need help quitting, ask your health care provider. Summary  Having a healthy lifestyle and getting preventive care can help to protect your health and wellness after age 17.  Screening and testing are the best way to find a health problem early and help you avoid having a fall. Early diagnosis and treatment give you the best chance for managing medical conditions that are more common for people who are older than age 48.  Falls are a major cause of broken bones and head injuries in people who are older than age 25. Take precautions to prevent a fall at home.  Work with your health care provider to learn what changes you can make to improve your health and wellness and to prevent falls. This  information is not intended to replace advice given to you by your health care provider. Make sure you discuss any questions you have with your health care provider. Document Revised: 06/14/2018 Document Reviewed: 01/04/2017 Elsevier Patient Education  2020 [employer].

## 2020-01-13 NOTE — Progress Notes (Signed)
Established Patient Office Visit  Subjective:  Patient ID: Brooke [name], female    DOB: September 15, 1955  Age: 64 y.o. MRN: [id number]  CC:  Chief Complaint  Patient presents with  . Medicare Wellness    HPI Brooke [name] presents for .    Encounter for general adult medical examination Physical Patient's last physical exam was 2 year ago .  Weight: Appropriate for height (BMI less than 27%) ;  Blood Pressure: Normal (BP greater than 120/80) ; 33.35 kg/mm Medical History: Patient history reviewed ; Family history reviewed ;  Allergies Reviewed: No change in current allergies ;  Medications Reviewed: Medications reviewed - no changes ;  Lipids: Abnormal lipids. Smoking: Life-long non-smoker ;  Physical Activity: Exercises at least 3 times per week ; as tolerated Alcohol/Drug Use: Is a non-drinker ; No illicit drug use ;  Patient is not afflicted from Stress Incontinence and Urge Incontinence  Safety: reviewed ; Patient wears a seat belt, has smoke detectors, has carbon monoxide detectors, and wears sunscreen with extended sun exposure. Dental Care: biannual cleanings: No, brushes and flosses daily. Ophthalmology/Optometry: Annual visit:  Last visit 2 years prior Hearing loss: none Vision impairments:wear glases  Past Medical History:  Diagnosis Date  . Bronchitis    frequent bronchitis  . Diabetes mellitus   . Diabetic nephropathy ([place])   . Hypertension   . Obesity (BMI 35.0-39.9 without comorbidity)   . Vertigo     Past Surgical History:  Procedure Laterality Date  . BREAST SURGERY Right 1974    Family History  Problem Relation Age of Onset  . Diabetes Mother   . Congestive Heart Failure Mother   . Kidney disease Father   . Diabetes Sister   . Diabetes Brother   . Hypertension Daughter   . Hypertension Sister   . Cervical cancer Sister     Social History   Socioeconomic History  . Marital status: Married    Spouse name: Donnie  . Number of  children: 1  . Years of education: Not on file  . Highest education level: Not on file  Occupational History  . Not on file  Tobacco Use  . Smoking status: Never Smoker  . Smokeless tobacco: Never Used  Vaping Use  . Vaping Use: Never used  Substance and Sexual Activity  . Alcohol use: No  . Drug use: No  . Sexual activity: Never  Other Topics Concern  . Not on file  Social History Narrative  . Not on file   Social Determinants of Health   Financial Resource Strain:   . Difficulty of Paying Living Expenses: Not on file  Food Insecurity:   . Worried About Charity fundraiser in the Last Year: Not on file  . Ran Out of Food in the Last Year: Not on file  Transportation Needs:   . Lack of Transportation (Medical): Not on file  . Lack of Transportation (Non-Medical): Not on file  Physical Activity:   . Days of Exercise per Week: Not on file  . Minutes of Exercise per Session: Not on file  Stress:   . Feeling of Stress : Not on file  Social Connections:   . Frequency of Communication with Friends and Family: Not on file  . Frequency of Social Gatherings with Friends and Family: Not on file  . Attends Religious Services: Not on file  . Active Member of Clubs or Organizations: Not on file  . Attends Archivist Meetings: Not on file  .  Marital Status: Not on file  Intimate Partner Violence:   . Fear of Current or Ex-Partner: Not on file  . Emotionally Abused: Not on file  . Physically Abused: Not on file  . Sexually Abused: Not on file    Outpatient Medications Prior to Visit  Medication Sig Dispense Refill  . albuterol (VENTOLIN HFA) 108 (90 Base) MCG/ACT inhaler Inhale 2 puffs into the lungs every 6 (six) hours as needed for wheezing or shortness of breath. 18 g 2  . amLODipine (NORVASC) 10 MG tablet Take 1 tablet (10 mg total) by mouth daily. 90 tablet 1  . aspirin EC 81 MG tablet Take 81 mg by mouth daily. Swallow whole.    Marland Kitchen atenolol (TENORMIN) 100 MG  tablet Take 1 tablet (100 mg total) by mouth every morning. 90 tablet 1  . budesonide-formoterol (SYMBICORT) 160-4.5 MCG/ACT inhaler Inhale 2 puffs into the lungs 2 (two) times daily. 1 Inhaler 2  . Dulaglutide (TRULICITY) 4.5 PZ/0.2HE SOPN Inject 4.5 mg as directed once a week. 6 mL 1  . glipiZIDE (GLUCOTROL) 10 MG tablet Take 2 tablets (20 mg total) by mouth 2 (two) times daily before a meal. 360 tablet 1  . lisinopril-hydrochlorothiazide (ZESTORETIC) 20-25 MG tablet Take 1 tablet by mouth daily. 90 tablet 1  . lovastatin (MEVACOR) 40 MG tablet Take 1 tablet (40 mg total) by mouth daily at 6 PM. 30 tablet 2  . metFORMIN (GLUCOPHAGE-XR) 500 MG 24 hr tablet Take 1 tablet (500 mg total) by mouth 2 (two) times daily with a meal. 180 tablet 1  . sitaGLIPtin (JANUVIA) 50 MG tablet Take 50 mg by mouth daily.     No facility-administered medications prior to visit.    No Known Allergies  ROS Review of Systems  Skin: Negative.   Neurological: Positive for numbness. Negative for light-headedness and headaches.  All other systems reviewed and are negative.     Objective:    Physical Exam Vitals reviewed.  Constitutional:      Appearance: Normal appearance. She is normal weight.  HENT:     Head: Normocephalic.     Right Ear: External ear normal. There is no impacted cerumen.     Left Ear: External ear normal. There is no impacted cerumen.     Nose: Congestion present.  Eyes:     Conjunctiva/sclera: Conjunctivae normal.  Cardiovascular:     Rate and Rhythm: Normal rate.     Pulses: Normal pulses.     Heart sounds: Normal heart sounds.  Pulmonary:     Effort: Pulmonary effort is normal.     Breath sounds: Normal breath sounds.  Abdominal:     General: Bowel sounds are normal.  Musculoskeletal:        General: No tenderness. Normal range of motion.     Cervical back: Normal range of motion.  Skin:    General: Skin is warm.  Neurological:     Mental Status: She is alert and  oriented to person, place, and time.  Psychiatric:        Mood and Affect: Mood normal.        Behavior: Behavior normal.     BP 129/70   Pulse 79   Temp (!) 97 F (36.1 C)   Ht 5\' 5"  (1.651 m)   Wt 200 lb 6.4 oz (90.9 kg)   LMP 03/16/2012   SpO2 96%   BMI 33.35 kg/m  Wt Readings from Last 3 Encounters:  01/13/20 200 lb 6.4 oz (  90.9 kg)  01/07/20 201 lb 9.6 oz (91.4 kg)  12/26/19 188 lb (85.3 kg)     Health Maintenance Due  Topic Date Due  . OPHTHALMOLOGY EXAM  Never done      Lab Results  Component Value Date   TSH 1.880 03/13/2019   Lab Results  Component Value Date   WBC 7.9 12/26/2019   HGB 12.3 12/26/2019   HCT 40.5 12/26/2019   MCV 87.7 12/26/2019   PLT 372 12/26/2019   Lab Results  Component Value Date   NA 138 12/26/2019   K 5.0 12/26/2019   CO2 27 12/26/2019   GLUCOSE 264 (H) 12/26/2019   BUN 30 (H) 12/26/2019   CREATININE 1.71 (H) 12/26/2019   BILITOT 0.7 12/26/2019   ALKPHOS 71 12/26/2019   AST 22 12/26/2019   ALT 29 12/26/2019   PROT 7.2 12/26/2019   ALBUMIN 3.6 12/26/2019   CALCIUM 9.2 12/26/2019   ANIONGAP 8 12/26/2019   Lab Results  Component Value Date   CHOL 210 (H) 11/28/2019   Lab Results  Component Value Date   HDL 42 11/28/2019   Lab Results  Component Value Date   LDLCALC 128 (H) 11/28/2019   Lab Results  Component Value Date   TRIG 223 (H) 11/28/2019   Lab Results  Component Value Date   CHOLHDL 5.0 (H) 11/28/2019   Lab Results  Component Value Date   HGBA1C 11.9 (H) 11/28/2019      Assessment & Plan:   Problem List Items Addressed This Visit      Other   Encounter for annual wellness exam in Medicare patient - Primary    Patient is a 64 year old female who is in clinic today for an encounter annual wellness exam.  Head to toe assessment completed.  Provided education to patient for preventative health and health maintenance.  Printed handouts given.  Mammogram ordered, Cologuard ordered, patient's labs  and not currently due. Patient completed flu and COVID-19 shots.  Pneumonia vaccine is due in 2022.      Relevant Orders   DG [facility] DEXA   MM Digital Screening   Cologuard      Ms. Cellucci , Thank you for taking time to come for your Medicare Wellness Visit. I appreciate your ongoing commitment to your health goals. Please review the following plan we discussed and let me know if I can assist you in the future.   These are the goals we discussed: Goals   None     This is a list of the screening recommended for you and due dates:  Health Maintenance  Topic Date Due  . Eye exam for diabetics  Never done  .  Hepatitis C: One time screening is recommended by Center for Disease Control  (CDC) for  adults born from [birth year] through 1967.   03/12/2020*  . HIV Screening  03/12/2020*  . Mammogram  08/29/2020*  . DEXA scan (bone density measurement)  08/29/2020*  . Colon Cancer Screening  08/29/2020*  . Tetanus Vaccine  08/29/2020*  . Pneumonia vaccines (1 of 2 - PCV13) 08/29/2020*  . Complete foot exam   03/12/2020  . Hemoglobin A1C  05/27/2020  . Flu Shot  Completed  . COVID-19 Vaccine  Completed  . Pap Smear  Discontinued  *Topic was postponed. The date shown is not the original due date.     Follow-up: Return in about 1 year (around 01/12/2021).    Ivy Lynn, NP

## 2020-01-13 NOTE — Assessment & Plan Note (Signed)
Patient is a 64 year old female who is in clinic today for an encounter annual wellness exam.  Head to toe assessment completed.  Provided education to patient for preventative health and health maintenance.  Printed handouts given.  Mammogram ordered, Cologuard ordered, patient's labs and not currently due. Patient completed flu and COVID-19 shots.  Pneumonia vaccine is due in 2022.

## 2020-01-14 DIAGNOSIS — Z780 Asymptomatic menopausal state: Secondary | ICD-10-CM | POA: Diagnosis not present

## 2020-01-28 ENCOUNTER — Emergency Department (HOSPITAL_COMMUNITY)
Admission: EM | Admit: 2020-01-28 | Discharge: 2020-01-28 | Disposition: A | Discharge status: Home / Self Care | Payer: Medicare HMO | Attending: Emergency Medicine | Admitting: Emergency Medicine

## 2020-01-28 ENCOUNTER — Other Ambulatory Visit: Payer: Self-pay

## 2020-01-28 ENCOUNTER — Encounter (HOSPITAL_COMMUNITY): Payer: Self-pay | Admitting: *Deleted

## 2020-01-28 DIAGNOSIS — S29012A Strain of muscle and tendon of back wall of thorax, initial encounter: Secondary | ICD-10-CM | POA: Diagnosis not present

## 2020-01-28 DIAGNOSIS — N183 Chronic kidney disease, stage 3 unspecified: Secondary | ICD-10-CM | POA: Insufficient documentation

## 2020-01-28 DIAGNOSIS — E785 Hyperlipidemia, unspecified: Secondary | ICD-10-CM | POA: Insufficient documentation

## 2020-01-28 DIAGNOSIS — M62838 Other muscle spasm: Secondary | ICD-10-CM

## 2020-01-28 DIAGNOSIS — E1140 Type 2 diabetes mellitus with diabetic neuropathy, unspecified: Secondary | ICD-10-CM | POA: Insufficient documentation

## 2020-01-28 DIAGNOSIS — I1310 Hypertensive heart and chronic kidney disease without heart failure, with stage 1 through stage 4 chronic kidney disease, or unspecified chronic kidney disease: Secondary | ICD-10-CM | POA: Diagnosis not present

## 2020-01-28 DIAGNOSIS — Z7984 Long term (current) use of oral hypoglycemic drugs: Secondary | ICD-10-CM | POA: Diagnosis not present

## 2020-01-28 DIAGNOSIS — Z7982 Long term (current) use of aspirin: Secondary | ICD-10-CM | POA: Insufficient documentation

## 2020-01-28 DIAGNOSIS — M62830 Muscle spasm of back: Secondary | ICD-10-CM | POA: Diagnosis not present

## 2020-01-28 DIAGNOSIS — T148XXA Other injury of unspecified body region, initial encounter: Secondary | ICD-10-CM | POA: Diagnosis not present

## 2020-01-28 DIAGNOSIS — E1122 Type 2 diabetes mellitus with diabetic chronic kidney disease: Secondary | ICD-10-CM | POA: Insufficient documentation

## 2020-01-28 DIAGNOSIS — M549 Dorsalgia, unspecified: Secondary | ICD-10-CM | POA: Diagnosis present

## 2020-01-28 DIAGNOSIS — E1169 Type 2 diabetes mellitus with other specified complication: Secondary | ICD-10-CM | POA: Diagnosis not present

## 2020-01-28 DIAGNOSIS — Z79899 Other long term (current) drug therapy: Secondary | ICD-10-CM | POA: Insufficient documentation

## 2020-01-28 DIAGNOSIS — X58XXXA Exposure to other specified factors, initial encounter: Secondary | ICD-10-CM | POA: Insufficient documentation

## 2020-01-28 NOTE — ED Notes (Signed)
Urine sample labeled with patient identification sticker and verified using two patient identifiers. Pt back to assigned room at this time and provided a blanket per request.

## 2020-01-28 NOTE — ED Provider Notes (Signed)
[facility] Provider Note   CSN: [id number] Arrival date & time: 01/28/20  1247     History Chief Complaint  Patient presents with  . Back Pain    Brooke [name] is a 64 y.o. female.  Who presents with 2 weeks of right upper back pain is elicited with movement, and radiates underneath of her arm.  She states when she is not moving,lying still, there is no pain.  Minimal tenderness to palpation.  Pain is only elicited with movement, and is very mild.  She states she has utilized a heating pad at home with some relief from the pain, as well as Tylenol.  She endorses history of similar pain in the past, and her right lumbar strain, for which she was seen by a chiropractor.  She states that pain resolved after short time.  She denies numbness, weakness in her extremities.  Endorses minimal tingling in right toes, consistent with her normal diabetic neuropathy symptoms.  denies chest pain, shortness of breath, pain with inspiration, palpitations.  Denies abdominal pain, nausea, vomiting, diarrhea.  Denies dysuria, hematuria, urinary frequency or urgency.  Denies symptoms of saddle anesthesia, urinary incontinence.  I have personally reviewed this patient's medical records.  She has history of type 2 diabetes, hyperlipidemia, hypertension, chronic bronchitis, CKD, and diabetic neuropathy.  HPI     Past Medical History:  Diagnosis Date  . Bronchitis    frequent bronchitis  . Diabetes mellitus   . Diabetic nephropathy ([place])   . Hypertension   . Obesity (BMI 35.0-39.9 without comorbidity)   . Vertigo     Patient Active Problem List   Diagnosis Date Noted  . Encounter for annual wellness exam in Medicare patient 01/13/2020  . Diabetic nephropathy ([place])   . CKD stage 3 due to type 2 diabetes mellitus ([place]) 03/14/2019  . Type 2 diabetes mellitus without complication, without long-term current use of insulin ([place]) 03/13/2019  . Hyperlipidemia associated with  type 2 diabetes mellitus ([place]) 03/13/2019  . Hypertension associated with diabetes ([place]) 03/13/2019  . Obesity (BMI 30-39.9) 03/13/2019  . Chronic bronchitis ([place]) 03/13/2019    Past Surgical History:  Procedure Laterality Date  . BREAST SURGERY Right 1974     OB History    Gravida      Para      Term      Preterm      AB      Living  1     SAB      TAB      Ectopic      Multiple      Live Births              Family History  Problem Relation Age of Onset  . Diabetes Mother   . Congestive Heart Failure Mother   . Kidney disease Father   . Diabetes Sister   . Diabetes Brother   . Hypertension Daughter   . Hypertension Sister   . Cervical cancer Sister     Social History   Tobacco Use  . Smoking status: Never Smoker  . Smokeless tobacco: Never Used  Vaping Use  . Vaping Use: Never used  Substance Use Topics  . Alcohol use: No  . Drug use: No    Home Medications Prior to Admission medications   Medication Sig Start Date End Date Taking? Authorizing Provider  albuterol (VENTOLIN HFA) 108 (90 Base) MCG/ACT inhaler Inhale 2 puffs into the lungs every 6 (six) hours as  needed for wheezing or shortness of breath. 06/26/19   [name], FNP  amLODipine (NORVASC) 10 MG tablet Take 1 tablet (10 mg total) by mouth daily. 11/28/19   [name], FNP  aspirin EC 81 MG tablet Take 81 mg by mouth daily. Swallow whole.    [provider]  atenolol (TENORMIN) 100 MG tablet Take 1 tablet (100 mg total) by mouth every morning. 11/28/19   [name], FNP  budesonide-formoterol [facility]) 160-4.5 MCG/ACT inhaler Inhale 2 puffs into the lungs 2 (two) times daily. 08/26/19   [name], FNP  Dulaglutide (TRULICITY) 4.5 RW/4.3XV SOPN Inject 4.5 mg as directed once a week. 08/01/19   [name], FNP  glipiZIDE (GLUCOTROL) 10 MG tablet Take 2 tablets (20 mg total) by mouth 2 (two) times daily before a meal. 11/28/19   [name], FNP    lisinopril-hydrochlorothiazide (ZESTORETIC) 20-25 MG tablet Take 1 tablet by mouth daily. 11/28/19   [name], FNP  lovastatin (MEVACOR) 40 MG tablet Take 1 tablet (40 mg total) by mouth daily at 6 PM. 12/08/19   [name], FNP  metFORMIN (GLUCOPHAGE-XR) 500 MG 24 hr tablet Take 1 tablet (500 mg total) by mouth 2 (two) times daily with a meal. 09/10/19   [name], FNP  sitaGLIPtin (JANUVIA) 50 MG tablet Take 50 mg by mouth daily.    [provider]    Allergies    Patient has no known allergies.  Review of Systems   Review of Systems  Constitutional: Negative for activity change, appetite change, chills, diaphoresis, fatigue and fever.  HENT: Negative.   Eyes: Negative.   Respiratory: Negative for chest tightness, shortness of breath and wheezing.   Cardiovascular: Negative for chest pain, palpitations and leg swelling.  Gastrointestinal: Negative for abdominal pain, blood in stool, constipation, diarrhea, nausea and vomiting.  Endocrine: Negative.   Genitourinary: Negative for decreased urine volume, difficulty urinating, dysuria, frequency, hematuria and urgency.  Musculoskeletal: Positive for myalgias.       Right mid back  Skin: Negative.   Allergic/Immunologic: Positive for immunocompromised state.       DMT2  Neurological: Negative for dizziness, tremors, weakness and headaches.  Hematological: Negative.   Psychiatric/Behavioral: Negative.     Physical Exam Updated Vital Signs BP (!) 141/65 (BP Location: Right Arm)   Pulse 71   Temp 97.7 F (36.5 C) (Oral)   Resp 19   Ht 5\' 6"  (1.676 m)   Wt 90.3 kg   LMP 03/16/2012   SpO2 97%   BMI 32.12 kg/m   Physical Exam Vitals and nursing note reviewed.  Constitutional:      Appearance: She is obese.  HENT:     Head: Normocephalic and atraumatic.     Mouth/Throat:     Mouth: Mucous membranes are moist.     Pharynx: No oropharyngeal exudate or posterior oropharyngeal erythema.  Eyes:      General:        Right eye: No discharge.        Left eye: No discharge.     Conjunctiva/sclera: Conjunctivae normal.     Pupils: Pupils are equal, round, and reactive to light.  Neck:     Trachea: Trachea and phonation normal.  Cardiovascular:     Rate and Rhythm: Normal rate and regular rhythm.     Pulses: Normal pulses.          Radial pulses are 2+ on the right side  and 2+ on the left side.       Dorsalis pedis pulses are 2+ on the right side and 2+ on the left side.     Heart sounds: Normal heart sounds. No murmur heard.   Pulmonary:     Effort: Pulmonary effort is normal. No respiratory distress.     Breath sounds: Normal breath sounds. No wheezing or rales.  Abdominal:     General: Bowel sounds are normal. There is no distension.     Palpations: Abdomen is soft.     Tenderness: There is no abdominal tenderness. There is no guarding or rebound.  Musculoskeletal:        General: No deformity.     Cervical back: Neck supple. Spasms present. No rigidity, tenderness or bony tenderness. No pain with movement, spinous process tenderness or muscular tenderness.     Thoracic back: Spasms and tenderness present. No bony tenderness.     Lumbar back: No spasms, tenderness or bony tenderness.       Back:     Right lower leg: No edema.     Left lower leg: No edema.     Comments: 5/5 strength in elbow flexion and extension, grip strength bilaterally  5/5 strength in plantar and dorsiflexion bilaterally  Lymphadenopathy:     Cervical: No cervical adenopathy.  Skin:    General: Skin is warm and dry.     Findings: No rash.     Comments: No skin changes suggestive of herpes zoster  Neurological:     General: No focal deficit present.     Mental Status: She is alert and oriented to person, place, and time. Mental status is at baseline.     Deep Tendon Reflexes:     Reflex Scores:      Patellar reflexes are 2+ on the right side and 2+ on the left side.      Achilles reflexes are 2+ on  the right side and 2+ on the left side. Psychiatric:        Mood and Affect: Mood normal.     ED Results / Procedures / Treatments   Labs (all labs ordered are listed, but only abnormal results are displayed) Labs Reviewed - No data to display  EKG None  Radiology No results found.  Procedures Procedures (including critical care time)  Medications Ordered in ED Medications - No data to display  ED Course  I have reviewed the triage vital signs and the nursing notes.  Pertinent labs & imaging results that were available during my care of the patient were reviewed by me and considered in my medical decision making (see chart for details).    MDM Rules/Calculators/A&P                         64 year old female with history of diabetes who presents with right thoracic back pain that is elicited with movement.    Differential diagnosis for this patient's pain includes but is not limited to acute muscle strain, herpes zoster, pancreatitis, retrocecal appendicitis, abdominal aortic aneurysm, nephritis/obstructive uropathy, discitis.   Hypertensive on intake 144/112.  Vital signs otherwise normal.  Physical exam significant for spasm of the thoracic paraspinous muscles bilaterally, tenderness palpation of the right latissimus.  No midline tenderness to palpation. Cardiopulmonary exam normal, neurovascularly intact in all 4 extremities.  Based on physical exam, this patient's symptoms appear to be secondary to muscle spasm and muscle strain of the right latissimus dorsi/thoracic paraspinous  muscles.  Recommend close follow-up with primary care doctor. She may utilize heat, massage, topical analgesia for symptoms. May utilize Tylenol as needed, avoid NSAID with CKD.   Given reassuring physical exam, vital signs no further work-up is warranted in the emergency department at this time.  Sonda voiced understanding of her medical evaluation and treatment plan.  Each of her questions  were answered to her expressed satisfaction.  Return precautions were given.  Patient is well-appearing and stable for discharge.   Final Clinical Impression(s) / ED Diagnoses Final diagnoses:  Muscle strain  Muscle spasm    Rx / DC Orders ED Discharge Orders    None       Aura Dials 01/28/20 1534    Noemi Chapel, MD 01/30/20 [phone]

## 2020-01-28 NOTE — ED Triage Notes (Signed)
Right mid back that radiates around to the front for couple of weeks.  Tried heat for with mild relief.  Denies any burning with urination or blood noted to urine. Denies any N/V.

## 2020-01-28 NOTE — ED Notes (Addendum)
Entered room and introduced self to patient at this time. Pt appears to be resting in bed, mildly uncomfortable, respirations are even and unlabored with equal chest rise and fall. Bed is locked in the lowest position, side rails x1 at patient request, call bell within reach. Educated on call light and hourly rounding, in agreement at this time.  Pt in restroom without assistance from staff and UA provided at this time.

## 2020-01-28 NOTE — Discharge Instructions (Addendum)
You were evaluated in the emergency department today for your right mid back pain.  Your physical exam and vital signs were very reassuring.  It appears the muscles in your mid and upper back, particularly on the right side are in spasm, meaning they are inappropriately tightened up.  This can cause pain primarily elicited with movement.    You may continue to utilize heating pad as you have been at home, as well as Tylenol as needed.  Topical pain relief such as Biofreeze, icy hot, lidocaine patches may also be of use to you.  I recommend massaging the area with a tennis ball, or having another person massage the area for you after applying heating pad, or after hot shower.  May follow-up with your primary care doctor for this issue.  Please return to the emergency department if develop any numbness, tingling, weakness in your extremities, any chest pain, shortness of breath, blood in your urine, or other new severe symptoms.

## 2020-03-09 ENCOUNTER — Other Ambulatory Visit: Payer: Self-pay | Admitting: Family Medicine

## 2020-03-09 DIAGNOSIS — E119 Type 2 diabetes mellitus without complications: Secondary | ICD-10-CM

## 2020-04-08 ENCOUNTER — Ambulatory Visit: Payer: Medicare HMO | Admitting: Family Medicine

## 2020-04-09 ENCOUNTER — Encounter: Payer: Self-pay | Admitting: Family Medicine

## 2020-04-14 ENCOUNTER — Other Ambulatory Visit: Payer: Self-pay

## 2020-04-14 ENCOUNTER — Ambulatory Visit (INDEPENDENT_AMBULATORY_CARE_PROVIDER_SITE_OTHER): Payer: Medicare HMO | Admitting: Family Medicine

## 2020-04-14 ENCOUNTER — Encounter: Payer: Self-pay | Admitting: Family Medicine

## 2020-04-14 VITALS — BP 137/80 | HR 75 | Temp 97.8°F | Ht 66.0 in | Wt 198.0 lb

## 2020-04-14 DIAGNOSIS — J410 Simple chronic bronchitis: Secondary | ICD-10-CM | POA: Diagnosis not present

## 2020-04-14 DIAGNOSIS — E669 Obesity, unspecified: Secondary | ICD-10-CM | POA: Diagnosis not present

## 2020-04-14 DIAGNOSIS — E119 Type 2 diabetes mellitus without complications: Secondary | ICD-10-CM

## 2020-04-14 DIAGNOSIS — R590 Localized enlarged lymph nodes: Secondary | ICD-10-CM

## 2020-04-14 DIAGNOSIS — E1122 Type 2 diabetes mellitus with diabetic chronic kidney disease: Secondary | ICD-10-CM

## 2020-04-14 DIAGNOSIS — I152 Hypertension secondary to endocrine disorders: Secondary | ICD-10-CM

## 2020-04-14 DIAGNOSIS — E1169 Type 2 diabetes mellitus with other specified complication: Secondary | ICD-10-CM | POA: Diagnosis not present

## 2020-04-14 DIAGNOSIS — E1159 Type 2 diabetes mellitus with other circulatory complications: Secondary | ICD-10-CM | POA: Diagnosis not present

## 2020-04-14 DIAGNOSIS — E1121 Type 2 diabetes mellitus with diabetic nephropathy: Secondary | ICD-10-CM

## 2020-04-14 DIAGNOSIS — E785 Hyperlipidemia, unspecified: Secondary | ICD-10-CM

## 2020-04-14 DIAGNOSIS — N183 Chronic kidney disease, stage 3 unspecified: Secondary | ICD-10-CM

## 2020-04-14 LAB — BAYER DCA HB A1C WAIVED: HB A1C (BAYER DCA - WAIVED): 10.1 % — ABNORMAL HIGH (ref <7.0)

## 2020-04-14 MED ORDER — EMPAGLIFLOZIN 25 MG PO TABS: 25.0000 mg | ORAL_TABLET | Freq: Every day | ORAL | 2 refills | Status: DC

## 2020-04-14 MED ORDER — ATENOLOL 100 MG PO TABS: 100.0000 mg | ORAL_TABLET | Freq: Every morning | ORAL | 1 refills | Status: DC

## 2020-04-14 MED ORDER — AMLODIPINE BESYLATE 10 MG PO TABS: 10.0000 mg | ORAL_TABLET | Freq: Every day | ORAL | 1 refills | Status: DC

## 2020-04-14 MED ORDER — METFORMIN HCL ER 500 MG PO TB24: 500.0000 mg | ORAL_TABLET | Freq: Two times a day (BID) | ORAL | 1 refills | Status: DC

## 2020-04-14 MED ORDER — BUDESONIDE-FORMOTEROL FUMARATE 160-4.5 MCG/ACT IN AERO: 2.0000 | INHALATION_SPRAY | Freq: Two times a day (BID) | RESPIRATORY_TRACT | 1 refills | Status: DC

## 2020-04-14 MED ORDER — LISINOPRIL-HYDROCHLOROTHIAZIDE 20-25 MG PO TABS: 1.0000 | ORAL_TABLET | Freq: Every day | ORAL | 1 refills | Status: DC

## 2020-04-14 MED ORDER — TRULICITY 4.5 MG/0.5ML ~~LOC~~ SOAJ: 4.5000 mg | SUBCUTANEOUS | 1 refills | Status: DC

## 2020-04-14 MED ORDER — LOVASTATIN 40 MG PO TABS: 40.0000 mg | ORAL_TABLET | Freq: Every day | ORAL | 1 refills | Status: DC

## 2020-04-14 MED ORDER — GLIPIZIDE 10 MG PO TABS: 20.0000 mg | ORAL_TABLET | Freq: Two times a day (BID) | ORAL | 1 refills | Status: DC

## 2020-04-14 NOTE — Progress Notes (Signed)
Assessment & Plan:  1. Type 2 diabetes mellitus without complication, without long-term current use of insulin (HCC) Last A1c 11.9, A1c 10.1 today. - Diabetes is not at goal of A1c < 7, but is improving. - Medications: Continue glipizide, Metformin, and Trulicity.  Advised patient to discontinue taking Januvia due to potential interaction with Trulicity.  Rx'd Jardiance. - Home glucose monitoring: Continue monitoring - Patient is currently taking a statin. Patient is taking an ACE-inhibitor/ARB.  - Instruction/counseling given: reminded to get eye exam, discussed the need for weight loss, discussed diet and provided printed educational material  Diabetes Health Maintenance Due  Topic Date Due  . OPHTHALMOLOGY EXAM  Never done  . HEMOGLOBIN A1C  05/27/2020  . FOOT EXAM  04/14/2021    Lab Results  Component Value Date   LABMICR 38.7 03/13/2019    - Microalbumin / creatinine urine ratio - Lipid panel - CMP14+EGFR - CBC with Differential/Platelet - Bayer DCA Hb A1c Waived - lovastatin (MEVACOR) 40 MG tablet; Take 1 tablet (40 mg total) by mouth daily at 6 PM.  Dispense: 90 tablet; Refill: 1 - lisinopril-hydrochlorothiazide (ZESTORETIC) 20-25 MG tablet; Take 1 tablet by mouth daily.  Dispense: 90 tablet; Refill: 1 - glipiZIDE (GLUCOTROL) 10 MG tablet; Take 2 tablets (20 mg total) by mouth 2 (two) times daily before a meal.  Dispense: 360 tablet; Refill: 1 - empagliflozin (JARDIANCE) 25 MG TABS tablet; Take 1 tablet (25 mg total) by mouth daily before breakfast.  Dispense: 30 tablet; Refill: 2 - metFORMIN (GLUCOPHAGE-XR) 500 MG 24 hr tablet; Take 1 tablet (500 mg total) by mouth 2 (two) times daily with a meal.  Dispense: 180 tablet; Refill: 1 - Dulaglutide (TRULICITY) 4.5 HA/1.9FX SOPN; Inject 4.5 mg as directed once a week.  Dispense: 6 mL; Refill: 1  2. Hypertension associated with diabetes ([place]) - Well controlled on current regimen.  - Lipid panel - CMP14+EGFR - CBC with  Differential/Platelet - amLODipine (NORVASC) 10 MG tablet; Take 1 tablet (10 mg total) by mouth daily.  Dispense: 90 tablet; Refill: 1 - atenolol (TENORMIN) 100 MG tablet; Take 1 tablet (100 mg total) by mouth every morning.  Dispense: 90 tablet; Refill: 1 - lisinopril-hydrochlorothiazide (ZESTORETIC) 20-25 MG tablet; Take 1 tablet by mouth daily.  Dispense: 90 tablet; Refill: 1  3. Hyperlipidemia associated with type 2 diabetes mellitus ([place]) - Labs to assess. - Lipid panel - CMP14+EGFR - lovastatin (MEVACOR) 40 MG tablet; Take 1 tablet (40 mg total) by mouth daily at 6 PM.  Dispense: 90 tablet; Refill: 1  4. CKD stage 3 due to type 2 diabetes mellitus (HCC) - CMP14+EGFR  5. Diabetic nephropathy associated with type 2 diabetes mellitus (HCC) - Microalbumin / creatinine urine ratio  6. Obesity (BMI 30-39.9) - Diet and exercise encouraged. - Lipid panel - CMP14+EGFR - CBC with Differential/Platelet  7. Simple chronic bronchitis ([place]) - Well controlled on current regimen.  - budesonide-formoterol (SYMBICORT) 160-4.5 MCG/ACT inhaler; Inhale 2 puffs into the lungs 2 (two) times daily.  Dispense: 3 each; Refill: 1  8. Cervical lymphadenopathy - Encouraged warm salt water gargles for her gum.   Return in about 3 months (around 07/12/2020) for DM.  [name], MSN, APRN, FNP-C [facility]  Subjective:    Patient ID: Brooke [name], female    DOB: [birth year], 65 y.o.   MRN: [id number]  Patient Care Team: [name], FNP as PCP - General (Family Medicine) Lavera Guise, [facility] (Pharmacist)   Chief Complaint:  Chief Complaint  Patient presents with  . Diabetes    3 month check up of chronic medical conditions     HPI: Brooke [name] is a 65 y.o. female presenting on 04/14/2020 for Diabetes (3 month check up of chronic medical conditions/)  Diabetes: Patient presents for follow up of diabetes. Current symptoms include: hyperglycemia. Known  diabetic complications: nephropathy. Medication compliance: Patient has been taking Trulicity, glipizide, and Metformin as prescribed.  The last time we saw each other put her on Farxiga and told her to stop taking Januvia.  She states she stopped the [place] after going to the hospital for dehydration and started back on the Januvia since she had some at home. Current diet: healthier. Current exercise: none. Home blood sugar records: 130s, 150s, 180s, and a little >200 fasting. Is she  on ACE inhibitor or angiotensin II receptor blocker? Yes. Is she on a statin? Yes.   COPD: Patient reports she is doing well with Symbicort twice daily.  She is no longer needing to use her albuterol inhaler.  New complaints: Patient reports her let me know on the right side of her neck is tender when she swallows, as well as her right ear.  She feels she is irritated her gum with a hard food as she does not have any teeth in the back.     Social history:  Relevant past medical, surgical, family and social history reviewed and updated as indicated. Interim medical history since our last visit reviewed.  Allergies and medications reviewed and updated.  DATA REVIEWED: CHART IN EPIC  ROS: Negative unless specifically indicated above in HPI.    Current Outpatient Medications:  .  albuterol (VENTOLIN HFA) 108 (90 Base) MCG/ACT inhaler, Inhale 2 puffs into the lungs every 6 (six) hours as needed for wheezing or shortness of breath., Disp: 18 g, Rfl: 2 .  amLODipine (NORVASC) 10 MG tablet, Take 1 tablet (10 mg total) by mouth daily., Disp: 90 tablet, Rfl: 1 .  aspirin EC 81 MG tablet, Take 81 mg by mouth daily. Swallow whole., Disp: , Rfl:  .  atenolol (TENORMIN) 100 MG tablet, Take 1 tablet (100 mg total) by mouth every morning., Disp: 90 tablet, Rfl: 1 .  budesonide-formoterol (SYMBICORT) 160-4.5 MCG/ACT inhaler, Inhale 2 puffs into the lungs 2 (two) times daily., Disp: 1 Inhaler, Rfl: 2 .  Dulaglutide (TRULICITY)  4.5 TD/1.7OH SOPN, Inject 4.5 mg as directed once a week., Disp: 6 mL, Rfl: 1 .  glipiZIDE (GLUCOTROL) 10 MG tablet, Take 2 tablets (20 mg total) by mouth 2 (two) times daily before a meal., Disp: 360 tablet, Rfl: 1 .  lisinopril-hydrochlorothiazide (ZESTORETIC) 20-25 MG tablet, Take 1 tablet by mouth daily., Disp: 90 tablet, Rfl: 1 .  lovastatin (MEVACOR) 40 MG tablet, Take 1 tablet (40 mg total) by mouth daily at 6 PM., Disp: 30 tablet, Rfl: 2 .  metFORMIN (GLUCOPHAGE-XR) 500 MG 24 hr tablet, TAKE 1 TABLET BY MOUTH TWICE DAILY WITH MEALS, Disp: 180 tablet, Rfl: 0 .  sitaGLIPtin (JANUVIA) 50 MG tablet, Take 50 mg by mouth daily., Disp: , Rfl:    No Known Allergies Past Medical History:  Diagnosis Date  . Bronchitis    frequent bronchitis  . Diabetes mellitus   . Diabetic nephropathy ([place])   . Hypertension   . Obesity (BMI 35.0-39.9 without comorbidity)   . Vertigo     Past Surgical History:  Procedure Laterality Date  . BREAST SURGERY Right 1974    Social History  Socioeconomic History  . Marital status: Married    Spouse name: Donnie  . Number of children: 1  . Years of education: Not on file  . Highest education level: Not on file  Occupational History  . Not on file  Tobacco Use  . Smoking status: Never Smoker  . Smokeless tobacco: Never Used  Vaping Use  . Vaping Use: Never used  Substance and Sexual Activity  . Alcohol use: No  . Drug use: No  . Sexual activity: Never  Other Topics Concern  . Not on file  Social History Narrative  . Not on file   Social Determinants of Health   Financial Resource Strain: Not on file  Food Insecurity: Not on file  Transportation Needs: Not on file  Physical Activity: Not on file  Stress: Not on file  Social Connections: Not on file  Intimate Partner Violence: Not on file        Objective:    BP 137/80   Pulse 75   Temp 97.8 F (36.6 C) (Temporal)   Ht '5\' 6"'  (5.035 m)   Wt 198 lb (89.8 kg)   LMP 03/16/2012    SpO2 95%   BMI 31.96 kg/m   Wt Readings from Last 3 Encounters:  04/14/20 198 lb (89.8 kg)  01/28/20 199 lb (90.3 kg)  01/13/20 200 lb 6.4 oz (90.9 kg)    Physical Exam Vitals reviewed.  Constitutional:      General: She is not in acute distress.    Appearance: Normal appearance. She is obese. She is not ill-appearing, toxic-appearing or diaphoretic.  HENT:     Head: Normocephalic and atraumatic.     Right Ear: Tympanic membrane, ear canal and external ear normal. There is no impacted cerumen.     Left Ear: Tympanic membrane, ear canal and external ear normal.     Mouth/Throat:     Mouth: Mucous membranes are moist.     Pharynx: Oropharynx is clear. No oropharyngeal exudate or posterior oropharyngeal erythema.  Eyes:     General: No scleral icterus.       Right eye: No discharge.        Left eye: No discharge.     Conjunctiva/sclera: Conjunctivae normal.  Cardiovascular:     Rate and Rhythm: Normal rate and regular rhythm.     Heart sounds: Normal heart sounds. No murmur heard. No friction rub. No gallop.   Pulmonary:     Effort: Pulmonary effort is normal. No respiratory distress.     Breath sounds: No stridor. Wheezing present. No rhonchi or rales.  Musculoskeletal:        General: Normal range of motion.     Cervical back: Normal range of motion.  Lymphadenopathy:     Cervical: Cervical adenopathy (right side) present.  Skin:    General: Skin is warm and dry.     Capillary Refill: Capillary refill takes less than 2 seconds.  Neurological:     General: No focal deficit present.     Mental Status: She is alert and oriented to person, place, and time. Mental status is at baseline.  Psychiatric:        Mood and Affect: Mood normal.        Behavior: Behavior normal.        Thought Content: Thought content normal.        Judgment: Judgment normal.    Diabetic Foot Exam - Simple   Simple Foot Form Diabetic Foot exam was performed with  the following findings: Yes  04/14/2020  3:08 PM  Visual Inspection No deformities, no ulcerations, no other skin breakdown bilaterally: Yes Sensation Testing Intact to touch and monofilament testing bilaterally: Yes Pulse Check Posterior Tibialis and Dorsalis pulse intact bilaterally: Yes Comments      Lab Results  Component Value Date   TSH 1.880 03/13/2019   Lab Results  Component Value Date   WBC 7.9 12/26/2019   HGB 12.3 12/26/2019   HCT 40.5 12/26/2019   MCV 87.7 12/26/2019   PLT 372 12/26/2019   Lab Results  Component Value Date   NA 138 12/26/2019   K 5.0 12/26/2019   CO2 27 12/26/2019   GLUCOSE 264 (H) 12/26/2019   BUN 30 (H) 12/26/2019   CREATININE 1.71 (H) 12/26/2019   BILITOT 0.7 12/26/2019   ALKPHOS 71 12/26/2019   AST 22 12/26/2019   ALT 29 12/26/2019   PROT 7.2 12/26/2019   ALBUMIN 3.6 12/26/2019   CALCIUM 9.2 12/26/2019   ANIONGAP 8 12/26/2019   Lab Results  Component Value Date   CHOL 210 (H) 11/28/2019   Lab Results  Component Value Date   HDL 42 11/28/2019   Lab Results  Component Value Date   LDLCALC 128 (H) 11/28/2019   Lab Results  Component Value Date   TRIG 223 (H) 11/28/2019   Lab Results  Component Value Date   CHOLHDL 5.0 (H) 11/28/2019   Lab Results  Component Value Date   HGBA1C 11.9 (H) 11/28/2019

## 2020-04-14 NOTE — Patient Instructions (Signed)
[place] Flavored Water  Diabetes Mellitus and Nutrition, Adult When you have diabetes, or diabetes mellitus, it is very important to have healthy eating habits because your blood sugar (glucose) levels are greatly affected by what you eat and drink. Eating healthy foods in the right amounts, at about the same times every day, can help you:  Control your blood glucose.  Lower your risk of heart disease.  Improve your blood pressure.  Reach or maintain a healthy weight. What can affect my meal plan? Every person with diabetes is different, and each person has different needs for a meal plan. Your health care provider may recommend that you work with a dietitian to make a meal plan that is best for you. Your meal plan may vary depending on factors such as:  The calories you need.  The medicines you take.  Your weight.  Your blood glucose, blood pressure, and cholesterol levels.  Your activity level.  Other health conditions you have, such as heart or kidney disease. How do carbohydrates affect me? Carbohydrates, also called carbs, affect your blood glucose level more than any other type of food. Eating carbs naturally raises the amount of glucose in your blood. Carb counting is a method for keeping track of how many carbs you eat. Counting carbs is important to keep your blood glucose at a healthy level, especially if you use insulin or take certain oral diabetes medicines. It is important to know how many carbs you can safely have in each meal. This is different for every person. Your dietitian can help you calculate how many carbs you should have at each meal and for each snack. How does alcohol affect me? Alcohol can cause a sudden decrease in blood glucose (hypoglycemia), especially if you use insulin or take certain oral diabetes medicines. Hypoglycemia can be a life-threatening condition. Symptoms of hypoglycemia, such as sleepiness, dizziness, and confusion, are similar to symptoms  of having too much alcohol.  Do not drink alcohol if: ? Your health care provider tells you not to drink. ? You are pregnant, may be pregnant, or are planning to become pregnant.  If you drink alcohol: ? Do not drink on an empty stomach. ? Limit how much you use to:  0-1 drink a day for women.  0-2 drinks a day for men. ? Be aware of how much alcohol is in your drink. In the [place] one drink equals one 12 oz bottle of beer (355 mL), one 5 oz glass of wine (148 mL), or one 1 oz glass of hard liquor (44 mL). ? Keep yourself hydrated with water, diet soda, or unsweetened iced tea.  Keep in mind that regular soda, juice, and other mixers may contain a lot of sugar and must be counted as carbs. What are tips for following this plan? Reading food labels  Start by checking the serving size on the "Nutrition Facts" label of packaged foods and drinks. The amount of calories, carbs, fats, and other nutrients listed on the label is based on one serving of the item. Many items contain more than one serving per package.  Check the total grams (g) of carbs in one serving. You can calculate the number of servings of carbs in one serving by dividing the total carbs by 15. For example, if a food has 30 g of total carbs per serving, it would be equal to 2 servings of carbs.  Check the number of grams (g) of saturated fats and trans fats in one serving.  Choose foods that have a low amount or none of these fats.  Check the number of milligrams (mg) of salt (sodium) in one serving. Most people should limit total sodium intake to less than 2,300 mg per day.  Always check the nutrition information of foods labeled as "low-fat" or "nonfat." These foods may be higher in added sugar or refined carbs and should be avoided.  Talk to your dietitian to identify your daily goals for nutrients listed on the label. Shopping  Avoid buying canned, pre-made, or processed foods. These foods tend to be high in fat,  sodium, and added sugar.  Shop around the outside edge of the grocery store. This is where you will most often find fresh fruits and vegetables, bulk grains, fresh meats, and fresh dairy. Cooking  Use low-heat cooking methods, such as baking, instead of high-heat cooking methods like deep frying.  Cook using healthy oils, such as olive, canola, or sunflower oil.  Avoid cooking with butter, cream, or high-fat meats. Meal planning  Eat meals and snacks regularly, preferably at the same times every day. Avoid going long periods of time without eating.  Eat foods that are high in fiber, such as fresh fruits, vegetables, beans, and whole grains. Talk with your dietitian about how many servings of carbs you can eat at each meal.  Eat 4-6 oz (112-168 g) of lean protein each day, such as lean meat, chicken, fish, eggs, or tofu. One ounce (oz) of lean protein is equal to: ? 1 oz (28 g) of meat, chicken, or fish. ? 1 egg. ?  cup (62 g) of tofu.  Eat some foods each day that contain healthy fats, such as avocado, nuts, seeds, and fish.   What foods should I eat? Fruits Berries. Apples. Oranges. Peaches. Apricots. Plums. Grapes. Mango. Papaya. Pomegranate. Kiwi. Cherries. Vegetables Lettuce. Spinach. Leafy greens, including kale, chard, collard greens, and mustard greens. Beets. Cauliflower. Cabbage. Broccoli. Carrots. Green beans. Tomatoes. Peppers. Onions. Cucumbers. Brussels sprouts. Grains Whole grains, such as whole-wheat or whole-grain bread, crackers, tortillas, cereal, and pasta. Unsweetened oatmeal. Quinoa. Brown or wild rice. Meats and other proteins Seafood. Poultry without skin. Lean cuts of poultry and beef. Tofu. Nuts. Seeds. Dairy Low-fat or fat-free dairy products such as milk, yogurt, and cheese. The items listed above may not be a complete list of foods and beverages you can eat. Contact a dietitian for more information. What foods should I avoid? Fruits Fruits canned with  syrup. Vegetables Canned vegetables. Frozen vegetables with butter or cream sauce. Grains Refined white flour and flour products such as bread, pasta, snack foods, and cereals. Avoid all processed foods. Meats and other proteins Fatty cuts of meat. Poultry with skin. Breaded or fried meats. Processed meat. Avoid saturated fats. Dairy Full-fat yogurt, cheese, or milk. Beverages Sweetened drinks, such as soda or iced tea. The items listed above may not be a complete list of foods and beverages you should avoid. Contact a dietitian for more information. Questions to ask a health care provider  Do I need to meet with a diabetes educator?  Do I need to meet with a dietitian?  What number can I call if I have questions?  When are the best times to check my blood glucose? Where to find more information:  American Diabetes Association: diabetes.org  Academy of Nutrition and Dietetics: www.eatright.[employer] of Diabetes and Digestive and Kidney Diseases: [employer]  Association of Diabetes Care and Education Specialists: www.diabeteseducator.org Summary  It is important  to have healthy eating habits because your blood sugar (glucose) levels are greatly affected by what you eat and drink.  A healthy meal plan will help you control your blood glucose and maintain a healthy lifestyle.  Your health care provider may recommend that you work with a dietitian to make a meal plan that is best for you.  Keep in mind that carbohydrates (carbs) and alcohol have immediate effects on your blood glucose levels. It is important to count carbs and to use alcohol carefully. This information is not intended to replace advice given to you by your health care provider. Make sure you discuss any questions you have with your health care provider. Document Revised: 01/29/2019 Document Reviewed: 01/29/2019 Elsevier Patient Education  2021 [employer].

## 2020-04-15 LAB — LIPID PANEL
Chol/HDL Ratio: 4.9 ratio — ABNORMAL HIGH (ref 0.0–4.4)
Cholesterol, Total: 205 mg/dL — ABNORMAL HIGH (ref 100–199)
HDL: 42 mg/dL (ref >39)
LDL Chol Calc (NIH): 126 mg/dL — ABNORMAL HIGH (ref 0–99)
Triglycerides: 210 mg/dL — ABNORMAL HIGH (ref 0–149)
VLDL Cholesterol Cal: 37 mg/dL (ref 5–40)

## 2020-04-15 LAB — CMP14+EGFR
ALT: 20 IU/L (ref 0–32)
AST: 13 IU/L (ref 0–40)
Albumin/Globulin Ratio: 1.5 (ref 1.2–2.2)
Albumin: 4.4 g/dL (ref 3.8–4.8)
Alkaline Phosphatase: 88 IU/L (ref 44–121)
BUN/Creatinine Ratio: 16 (ref 12–28)
BUN: 25 mg/dL (ref 8–27)
Bilirubin Total: 0.2 mg/dL (ref 0.0–1.2)
CO2: 24 mmol/L (ref 20–29)
Calcium: 9.5 mg/dL (ref 8.7–10.3)
Chloride: 101 mmol/L (ref 96–106)
Creatinine, Ser: 1.59 mg/dL — ABNORMAL HIGH (ref 0.57–1.00)
GFR calc Af Amer: 39 mL/min/{1.73_m2} — ABNORMAL LOW (ref >59)
GFR calc non Af Amer: 34 mL/min/{1.73_m2} — ABNORMAL LOW (ref >59)
Globulin, Total: 3 g/dL (ref 1.5–4.5)
Glucose: 188 mg/dL — ABNORMAL HIGH (ref 65–99)
Potassium: 4.1 mmol/L (ref 3.5–5.2)
Sodium: 138 mmol/L (ref 134–144)
Total Protein: 7.4 g/dL (ref 6.0–8.5)

## 2020-04-15 LAB — CBC WITH DIFFERENTIAL/PLATELET
Basophils Absolute: 0.1 10*3/uL (ref 0.0–0.2)
Basos: 1 %
EOS (ABSOLUTE): 0.8 10*3/uL — ABNORMAL HIGH (ref 0.0–0.4)
Eos: 8 %
Hematocrit: 38.9 % (ref 34.0–46.6)
Hemoglobin: 12.6 g/dL (ref 11.1–15.9)
Immature Grans (Abs): 0 10*3/uL (ref 0.0–0.1)
Immature Granulocytes: 0 %
Lymphocytes Absolute: 3.6 10*3/uL — ABNORMAL HIGH (ref 0.7–3.1)
Lymphs: 39 %
MCH: 26.8 pg (ref 26.6–33.0)
MCHC: 32.4 g/dL (ref 31.5–35.7)
MCV: 83 fL (ref 79–97)
Monocytes Absolute: 0.6 10*3/uL (ref 0.1–0.9)
Monocytes: 6 %
Neutrophils Absolute: 4.1 10*3/uL (ref 1.4–7.0)
Neutrophils: 46 %
Platelets: 443 10*3/uL (ref 150–450)
RBC: 4.71 x10E6/uL (ref 3.77–5.28)
RDW: 13.6 % (ref 11.7–15.4)
WBC: 9.1 10*3/uL (ref 3.4–10.8)

## 2020-04-15 LAB — MICROALBUMIN / CREATININE URINE RATIO
Creatinine, Urine: 148.5 mg/dL
Microalb/Creat Ratio: 144 mg/g creat — ABNORMAL HIGH (ref 0–29)
Microalbumin, Urine: 214.4 ug/mL

## 2020-04-16 ENCOUNTER — Other Ambulatory Visit: Payer: Self-pay | Admitting: Family Medicine

## 2020-04-16 DIAGNOSIS — E119 Type 2 diabetes mellitus without complications: Secondary | ICD-10-CM

## 2020-04-16 DIAGNOSIS — E785 Hyperlipidemia, unspecified: Secondary | ICD-10-CM

## 2020-04-16 MED ORDER — LOVASTATIN ER 60 MG PO TB24: 60.0000 mg | ORAL_TABLET | Freq: Every day | ORAL | 2 refills | Status: DC

## 2020-04-20 ENCOUNTER — Telehealth: Payer: Self-pay

## 2020-04-20 DIAGNOSIS — J410 Simple chronic bronchitis: Secondary | ICD-10-CM

## 2020-04-20 DIAGNOSIS — E119 Type 2 diabetes mellitus without complications: Secondary | ICD-10-CM

## 2020-04-20 NOTE — Telephone Encounter (Signed)
Pt is already enrolled in patient assistance program at the health department in [place].  She is wondering if the rx for Dulaglutide (TRULICITY) 4.5 VU/0.2BX SOPN and budesonide-formoterol (SYMBICORT) 160-4.5 MCG/ACT inhaler need to go to the health department instead of [place]. It was $500 at [place]. Please call back

## 2020-04-21 MED ORDER — BUDESONIDE-FORMOTEROL FUMARATE 160-4.5 MCG/ACT IN AERO: 2.0000 | INHALATION_SPRAY | Freq: Two times a day (BID) | RESPIRATORY_TRACT | 1 refills | Status: DC

## 2020-04-21 MED ORDER — TRULICITY 4.5 MG/0.5ML ~~LOC~~ SOAJ: 4.5000 mg | SUBCUTANEOUS | 1 refills | Status: DC

## 2020-04-21 NOTE — Telephone Encounter (Signed)
Yes, can we please print and fax over to them?

## 2020-04-21 NOTE — Telephone Encounter (Signed)
Faxed and left patient  VM.

## 2020-04-23 MED ORDER — EMPAGLIFLOZIN 25 MG PO TABS: 25.0000 mg | ORAL_TABLET | Freq: Every day | ORAL | 2 refills | Status: DC

## 2020-04-23 NOTE — Telephone Encounter (Signed)
Printed on your desk to sign

## 2020-04-23 NOTE — Telephone Encounter (Signed)
Pt says that she needs jardiance rx sent to health department also because she gets it cheaper there.

## 2020-04-24 NOTE — Telephone Encounter (Signed)
Signed and put in my out-basket to be faxed.

## 2020-04-24 NOTE — Telephone Encounter (Signed)
Faxed

## 2020-05-01 ENCOUNTER — Telehealth: Payer: Self-pay | Admitting: *Deleted

## 2020-05-01 ENCOUNTER — Other Ambulatory Visit: Payer: Self-pay | Admitting: Family Medicine

## 2020-05-01 DIAGNOSIS — E1169 Type 2 diabetes mellitus with other specified complication: Secondary | ICD-10-CM

## 2020-05-01 DIAGNOSIS — E785 Hyperlipidemia, unspecified: Secondary | ICD-10-CM

## 2020-05-01 DIAGNOSIS — E119 Type 2 diabetes mellitus without complications: Secondary | ICD-10-CM

## 2020-05-01 MED ORDER — LOVASTATIN ER 60 MG PO TB24: 60.0000 mg | ORAL_TABLET | Freq: Every day | ORAL | 2 refills | Status: DC

## 2020-05-01 NOTE — Progress Notes (Signed)
Letter from [facility] states that Altoprev 60 mg tablet is nonformulary and that alternative drugs include simvastatin tablet, lovastatin tablet, pravastatin tablet, atorvastatin tablet, and ezetimibe tablet.  I refaxed over the order for lovastatin with the directions to dispense as written.

## 2020-05-01 NOTE — Telephone Encounter (Signed)
Altoprev 60MG  er tablets PA came in - started Key: [id number] Sent to plan

## 2020-05-04 NOTE — Telephone Encounter (Signed)
Denied on February 25 The drug you asked for is non-formulary (not on [employer] list of preferred drugs). Before the drug can be covered, we need more information. Please ask your prescriber to explain to [facility] why the preferred drugs have not worked for your medical condition and/or would have bad side effects. If you have not tried the preferred drugs, including atorvastatin tablet, ezetimibe tablet, pravastatin tablet, and simvastatin tablet, please talk to your health care provider about prescribing one of these for you  It was noted in [place] that other doses were ineffective.

## 2020-05-05 NOTE — Telephone Encounter (Signed)
I believe she gets prescription assistance through the [place], is this a medication they could help [place] with?

## 2020-05-06 MED ORDER — LOVASTATIN 40 MG PO TABS: 60.0000 mg | ORAL_TABLET | Freq: Every day | ORAL | 1 refills | Status: DC

## 2020-05-06 NOTE — Telephone Encounter (Signed)
I changed her tablet strength back to 40 mg and just change the directions to take 60 mg once daily as her insurance was paying for the 40 mg tablet.  I have called patient and communicated this with her.  She understands and will adjust how she is taking medication.

## 2020-05-06 NOTE — Telephone Encounter (Signed)
We called [facility] and this is not one they can get

## 2020-05-12 ENCOUNTER — Telehealth: Payer: Self-pay | Admitting: *Deleted

## 2020-05-12 NOTE — Telephone Encounter (Signed)
Patient approved for Jardiance from 05/11/20-03/06/21 from [employer].

## 2020-05-16 ENCOUNTER — Other Ambulatory Visit: Payer: Self-pay

## 2020-05-16 ENCOUNTER — Encounter (HOSPITAL_COMMUNITY): Payer: Self-pay | Admitting: Emergency Medicine

## 2020-05-16 ENCOUNTER — Emergency Department (HOSPITAL_COMMUNITY)
Admission: EM | Admit: 2020-05-16 | Discharge: 2020-05-16 | Disposition: A | Discharge status: Home / Self Care | Payer: Medicare HMO | Attending: Emergency Medicine | Admitting: Emergency Medicine

## 2020-05-16 DIAGNOSIS — R112 Nausea with vomiting, unspecified: Secondary | ICD-10-CM | POA: Diagnosis not present

## 2020-05-16 DIAGNOSIS — Z79899 Other long term (current) drug therapy: Secondary | ICD-10-CM | POA: Insufficient documentation

## 2020-05-16 DIAGNOSIS — N183 Chronic kidney disease, stage 3 unspecified: Secondary | ICD-10-CM | POA: Diagnosis not present

## 2020-05-16 DIAGNOSIS — E1122 Type 2 diabetes mellitus with diabetic chronic kidney disease: Secondary | ICD-10-CM | POA: Diagnosis not present

## 2020-05-16 DIAGNOSIS — N390 Urinary tract infection, site not specified: Secondary | ICD-10-CM | POA: Insufficient documentation

## 2020-05-16 DIAGNOSIS — I129 Hypertensive chronic kidney disease with stage 1 through stage 4 chronic kidney disease, or unspecified chronic kidney disease: Secondary | ICD-10-CM | POA: Diagnosis not present

## 2020-05-16 DIAGNOSIS — Z7982 Long term (current) use of aspirin: Secondary | ICD-10-CM | POA: Insufficient documentation

## 2020-05-16 DIAGNOSIS — R197 Diarrhea, unspecified: Secondary | ICD-10-CM

## 2020-05-16 DIAGNOSIS — Z7984 Long term (current) use of oral hypoglycemic drugs: Secondary | ICD-10-CM | POA: Diagnosis not present

## 2020-05-16 LAB — CBC
HCT: 43.6 % (ref 36.0–46.0)
Hemoglobin: 13.4 g/dL (ref 12.0–15.0)
MCH: 26.7 pg (ref 26.0–34.0)
MCHC: 30.7 g/dL (ref 30.0–36.0)
MCV: 87 fL (ref 80.0–100.0)
Platelets: 400 10*3/uL (ref 150–400)
RBC: 5.01 MIL/uL (ref 3.87–5.11)
RDW: 13.5 % (ref 11.5–15.5)
WBC: 15.2 10*3/uL — ABNORMAL HIGH (ref 4.0–10.5)
nRBC: 0 % (ref 0.0–0.2)

## 2020-05-16 LAB — URINALYSIS, ROUTINE W REFLEX MICROSCOPIC
Bilirubin Urine: NEGATIVE
Glucose, UA: >=500 mg/dL — AB
Ketones, ur: NEGATIVE mg/dL
Nitrite: NEGATIVE
Protein, ur: 100 mg/dL — AB
RBC / HPF: >50 RBC/hpf — ABNORMAL HIGH (ref 0–5)
Specific Gravity, Urine: 1.014 (ref 1.005–1.030)
pH: 5 (ref 5.0–8.0)

## 2020-05-16 LAB — COMPREHENSIVE METABOLIC PANEL
ALT: 21 U/L (ref 0–44)
AST: 21 U/L (ref 15–41)
Albumin: 3.6 g/dL (ref 3.5–5.0)
Alkaline Phosphatase: 69 U/L (ref 38–126)
Anion gap: 11 (ref 5–15)
BUN: 21 mg/dL (ref 8–23)
CO2: 24 mmol/L (ref 22–32)
Calcium: 9 mg/dL (ref 8.9–10.3)
Chloride: 100 mmol/L (ref 98–111)
Creatinine, Ser: 1.44 mg/dL — ABNORMAL HIGH (ref 0.44–1.00)
GFR, Estimated: 40 mL/min — ABNORMAL LOW (ref >60)
Glucose, Bld: 334 mg/dL — ABNORMAL HIGH (ref 70–99)
Potassium: 4.6 mmol/L (ref 3.5–5.1)
Sodium: 135 mmol/L (ref 135–145)
Total Bilirubin: 0.6 mg/dL (ref 0.3–1.2)
Total Protein: 7.5 g/dL (ref 6.5–8.1)

## 2020-05-16 LAB — LIPASE, BLOOD: Lipase: 28 U/L (ref 11–51)

## 2020-05-16 LAB — CBG MONITORING, ED: Glucose-Capillary: 339 mg/dL — ABNORMAL HIGH (ref 70–99)

## 2020-05-16 MED ORDER — CEPHALEXIN 500 MG PO CAPS: 500.0000 mg | ORAL_CAPSULE | Freq: Four times a day (QID) | ORAL | 0 refills | Status: AC

## 2020-05-16 MED ORDER — ONDANSETRON HCL 4 MG/2ML IJ SOLN
4.0000 mg | Freq: Once | INTRAMUSCULAR | Status: AC
  Administered 2020-05-16: 4 mg via INTRAVENOUS
  Filled 2020-05-16: qty 2

## 2020-05-16 MED ORDER — ONDANSETRON 4 MG PO TBDP: 4.0000 mg | ORAL_TABLET | Freq: Three times a day (TID) | ORAL | 0 refills | Status: DC | PRN

## 2020-05-16 MED ORDER — SODIUM CHLORIDE 0.9 % IV BOLUS
1000.0000 mL | Freq: Once | INTRAVENOUS | Status: AC
  Administered 2020-05-16: 1000 mL via INTRAVENOUS

## 2020-05-16 MED ORDER — CEPHALEXIN 500 MG PO CAPS
500.0000 mg | ORAL_CAPSULE | Freq: Once | ORAL | Status: AC
  Administered 2020-05-16: 500 mg via ORAL
  Filled 2020-05-16: qty 1

## 2020-05-16 NOTE — ED Triage Notes (Signed)
Pt c/o vomiting and diarrhea that began this morning.

## 2020-05-16 NOTE — Discharge Instructions (Addendum)
See your Physician for recheck next week  °

## 2020-05-16 NOTE — ED Notes (Signed)
Entered room and introduced self to patient. Pt appears to be resting in bed, respirations are even and unlabored with equal chest rise and fall. Bed is locked in the lowest position, side rails x2, call bell within reach. Pt educated on call light use and hourly rounding, verbalized understanding and in agreement at this time. All questions and concerns voiced addressed. Refreshments offered and provided per patient request.   ED provider at bedside at this time.

## 2020-05-16 NOTE — ED Provider Notes (Signed)
[facility] EMERGENCY DEPARTMENT Provider Note   CSN: [id number] Arrival date & time: 05/16/20  1450     History Chief Complaint  Patient presents with  . Emesis    Brooke [name] is a 65 y.o. female.  The history is provided by the patient. No language interpreter was used.  Emesis Severity:  Moderate Duration:  1 day Timing:  Constant Number of daily episodes:  Multiple  Able to tolerate:  Liquids Progression:  Worsening Chronicity:  New Relieved by:  Nothing Worsened by:  Nothing Ineffective treatments:  None tried Associated symptoms: no abdominal pain   Risk factors: no sick contacts        Past Medical History:  Diagnosis Date  . Bronchitis    frequent bronchitis  . Diabetes mellitus   . Diabetic nephropathy ([place])   . Hypertension   . Obesity (BMI 35.0-39.9 without comorbidity)   . Vertigo     Patient Active Problem List   Diagnosis Date Noted  . Diabetic nephropathy ([place])   . CKD stage 3 due to type 2 diabetes mellitus ([place]) 03/14/2019  . Type 2 diabetes mellitus without complication, without long-term current use of insulin ([place]) 03/13/2019  . Hyperlipidemia associated with type 2 diabetes mellitus ([place]) 03/13/2019  . Hypertension associated with diabetes ([place]) 03/13/2019  . Obesity (BMI 30-39.9) 03/13/2019  . Chronic bronchitis ([place]) 03/13/2019    Past Surgical History:  Procedure Laterality Date  . BREAST SURGERY Right 1974     OB History    Gravida      Para      Term      Preterm      AB      Living  1     SAB      IAB      Ectopic      Multiple      Live Births              Family History  Problem Relation Age of Onset  . Diabetes Mother   . Congestive Heart Failure Mother   . Kidney disease Father   . Diabetes Sister   . Diabetes Brother   . Hypertension Daughter   . Hypertension Sister   . Cervical cancer Sister     Social History   Tobacco Use  . Smoking status: Never Smoker  . Smokeless  tobacco: Never Used  Vaping Use  . Vaping Use: Never used  Substance Use Topics  . Alcohol use: No  . Drug use: No    Home Medications Prior to Admission medications   Medication Sig Start Date End Date Taking? Authorizing Provider  cephALEXin (KEFLEX) 500 MG capsule Take 1 capsule (500 mg total) by mouth 4 (four) times daily for 10 days. 05/16/20 05/26/20 Yes Caryl Ada K, PA-C  ondansetron (ZOFRAN ODT) 4 MG disintegrating tablet Take 1 tablet (4 mg total) by mouth every 8 (eight) hours as needed for nausea or vomiting. 05/16/20  Yes Caryl Ada K, PA-C  albuterol (VENTOLIN HFA) 108 (90 Base) MCG/ACT inhaler Inhale 2 puffs into the lungs every 6 (six) hours as needed for wheezing or shortness of breath. 06/26/19   [name], FNP  amLODipine (NORVASC) 10 MG tablet Take 1 tablet (10 mg total) by mouth daily. 04/14/20   [name], FNP  aspirin EC 81 MG tablet Take 81 mg by mouth daily. Swallow whole.    [provider]  atenolol (TENORMIN) 100 MG tablet Take 1 tablet (100  mg total) by mouth every morning. 04/14/20   [name], FNP  budesonide-formoterol [facility]) 160-4.5 MCG/ACT inhaler Inhale 2 puffs into the lungs 2 (two) times daily. 04/21/20   [name], FNP  Dulaglutide (TRULICITY) 4.5 WF/0.9NA SOPN Inject 4.5 mg as directed once a week. 04/21/20   [name], FNP  empagliflozin (JARDIANCE) 25 MG TABS tablet Take 1 tablet (25 mg total) by mouth daily before breakfast. 04/23/20   [name], FNP  glipiZIDE (GLUCOTROL) 10 MG tablet Take 2 tablets (20 mg total) by mouth 2 (two) times daily before a meal. 04/14/20   [name], FNP  lisinopril-hydrochlorothiazide (ZESTORETIC) 20-25 MG tablet Take 1 tablet by mouth daily. 04/14/20   [name], FNP  lovastatin (MEVACOR) 40 MG tablet Take 1.5 tablets (60 mg total) by mouth at bedtime. 05/06/20   [name], FNP  metFORMIN (GLUCOPHAGE-XR) 500 MG 24 hr tablet Take 1 tablet (500 mg total) by  mouth 2 (two) times daily with a meal. 04/14/20   [name], FNP    Allergies    Patient has no known allergies.  Review of Systems   Review of Systems  Gastrointestinal: Positive for vomiting. Negative for abdominal pain.  All other systems reviewed and are negative.   Physical Exam Updated Vital Signs BP 131/75 (BP Location: Right Arm)   Pulse 97   Temp 98.3 F (36.8 C) (Oral)   Resp 16   Ht 5\' 6"  (1.676 m)   Wt 89.8 kg   LMP 03/16/2012   SpO2 95%   BMI 31.96 kg/m   Physical Exam Vitals and nursing note reviewed.  Constitutional:      Appearance: She is well-developed.  HENT:     Head: Normocephalic.     Mouth/Throat:     Mouth: Mucous membranes are moist.  Eyes:     Pupils: Pupils are equal, round, and reactive to light.  Cardiovascular:     Rate and Rhythm: Normal rate and regular rhythm.  Pulmonary:     Effort: Pulmonary effort is normal.  Abdominal:     General: Abdomen is flat. There is no distension.  Musculoskeletal:        General: Normal range of motion.     Cervical back: Normal range of motion.  Skin:    General: Skin is warm.  Neurological:     General: No focal deficit present.     Mental Status: She is alert and oriented to person, place, and time.  Psychiatric:        Mood and Affect: Mood normal.     ED Results / Procedures / Treatments   Labs (all labs ordered are listed, but only abnormal results are displayed) Labs Reviewed  COMPREHENSIVE METABOLIC PANEL - Abnormal; Notable for the following components:      Result Value   Glucose, Bld 334 (*)    Creatinine, Ser 1.44 (*)    GFR, Estimated 40 (*)    All other components within normal limits  CBC - Abnormal; Notable for the following components:   WBC 15.2 (*)    All other components within normal limits  URINALYSIS, ROUTINE W REFLEX MICROSCOPIC - Abnormal; Notable for the following components:   APPearance HAZY (*)    Glucose, UA >=500 (*)    Hgb urine dipstick LARGE (*)     Protein, ur 100 (*)    Leukocytes,Ua MODERATE (*)    RBC / HPF >50 (*)    Bacteria,  UA MANY (*)    All other components within normal limits  CBG MONITORING, ED - Abnormal; Notable for the following components:   Glucose-Capillary 339 (*)    All other components within normal limits  LIPASE, BLOOD    EKG None  Radiology No results found.  Procedures Procedures   Medications Ordered in ED Medications  cephALEXin (KEFLEX) capsule 500 mg (has no administration in time range)  sodium chloride 0.9 % bolus 1,000 mL (1,000 mLs Intravenous New Bag/Given 05/16/20 1736)  ondansetron (ZOFRAN) injection 4 mg (4 mg Intravenous Given 05/16/20 1736)    ED Course  I have reviewed the triage vital signs and the nursing notes.  Pertinent labs & imaging results that were available during my care of the patient were reviewed by me and considered in my medical decision making (see chart for details).    MDM Rules/Calculators/A&P                          MDM: Pt given Iv fluids, zofran iv.  Labs reviewed,  Urine shows many bacteria, greater than 50 wbcs and red blood cells.   Final Clinical Impression(s) / ED Diagnoses Final diagnoses:  Nausea vomiting and diarrhea  Urinary tract infection without hematuria, site unspecified    Rx / DC Orders ED Discharge Orders         Ordered    ondansetron (ZOFRAN ODT) 4 MG disintegrating tablet  Every 8 hours PRN        05/16/20 1840    cephALEXin (KEFLEX) 500 MG capsule  4 times daily        05/16/20 1840        An After Visit Summary was printed and given to the patient.    Sidney Ace 05/16/20 1853    Truddie Hidden, MD 05/16/20 [phone]

## 2020-06-15 ENCOUNTER — Other Ambulatory Visit: Payer: Self-pay | Admitting: Family Medicine

## 2020-06-15 DIAGNOSIS — E119 Type 2 diabetes mellitus without complications: Secondary | ICD-10-CM

## 2020-07-14 ENCOUNTER — Ambulatory Visit: Payer: Medicare HMO | Admitting: Family Medicine

## 2020-07-28 ENCOUNTER — Encounter: Payer: Self-pay | Admitting: Family Medicine

## 2020-07-28 ENCOUNTER — Other Ambulatory Visit: Payer: Self-pay

## 2020-07-28 ENCOUNTER — Ambulatory Visit (INDEPENDENT_AMBULATORY_CARE_PROVIDER_SITE_OTHER): Payer: Medicare HMO | Admitting: Family Medicine

## 2020-07-28 VITALS — BP 141/79 | HR 73 | Temp 97.4°F | Ht 66.0 in | Wt 201.2 lb

## 2020-07-28 DIAGNOSIS — Z794 Long term (current) use of insulin: Secondary | ICD-10-CM | POA: Diagnosis not present

## 2020-07-28 DIAGNOSIS — E1169 Type 2 diabetes mellitus with other specified complication: Secondary | ICD-10-CM | POA: Diagnosis not present

## 2020-07-28 DIAGNOSIS — E1165 Type 2 diabetes mellitus with hyperglycemia: Secondary | ICD-10-CM | POA: Diagnosis not present

## 2020-07-28 DIAGNOSIS — E785 Hyperlipidemia, unspecified: Secondary | ICD-10-CM

## 2020-07-28 DIAGNOSIS — M791 Myalgia, unspecified site: Secondary | ICD-10-CM | POA: Diagnosis not present

## 2020-07-28 DIAGNOSIS — E119 Type 2 diabetes mellitus without complications: Secondary | ICD-10-CM | POA: Diagnosis not present

## 2020-07-28 LAB — BAYER DCA HB A1C WAIVED: HB A1C (BAYER DCA - WAIVED): 11.4 % — ABNORMAL HIGH (ref <7.0)

## 2020-07-28 MED ORDER — TRESIBA FLEXTOUCH 100 UNIT/ML ~~LOC~~ SOPN: 10.0000 [IU] | PEN_INJECTOR | Freq: Every day | SUBCUTANEOUS | 1 refills | Status: DC

## 2020-07-28 NOTE — Patient Instructions (Signed)
Stop Glipizide. Start [place].  Check blood glucose every morning and keep a log.

## 2020-07-28 NOTE — Progress Notes (Signed)
Assessment & Plan:  1. Type 2 diabetes mellitus with hyperglycemia, with long-term current use of insulin (HCC) Lab Results  Component Value Date   HGBA1C 11.4 (H) 07/28/2020   HGBA1C 10.1 (H) 04/14/2020   HGBA1C 11.9 (H) 11/28/2019    - Diabetes is not at goal of A1c < 7. - Medications: continue Trulicity 4.5 mg once weekly, Jardiance 25 mg once daily, and metformin 500 mg twice daily.  Discontinue glipizide.  Start Tresiba 10 units once daily. - Home glucose monitoring: Advised to monitor daily fasting for adjustment of Tresiba. - Patient is currently taking a statin. Patient is taking an ACE-inhibitor/ARB.  - Instruction/counseling given: discussed diet  Diabetes Health Maintenance Due  Topic Date Due  . OPHTHALMOLOGY EXAM  Never done  . HEMOGLOBIN A1C  01/28/2021  . FOOT EXAM  04/14/2021    - Lipid panel - CMP14+EGFR - CBC with Differential/Platelet - Bayer DCA Hb A1c Waived - insulin degludec (TRESIBA FLEXTOUCH) 100 UNIT/ML FlexTouch Pen; Inject 10 Units into the skin daily.  Dispense: 3 mL; Refill: 1  2. Hyperlipidemia associated with type 2 diabetes mellitus ([place]) Previously uncontrolled.  Labs to assess today. - Lipid panel - CMP14+EGFR - CBC with Differential/Platelet  3. Muscle pain Tylenol, heating pad, muscle rub, and Lidoderm patches.   Return in about 1 week (around 08/04/2020) for DM.  [name], MSN, APRN, FNP-C [facility]  Subjective:    Patient ID: Brooke [name], female    DOB: [birth year], 65 y.o.   MRN: [id number]  Patient Care Team: Brooke Brooklyn, FNP as PCP - General (Family Medicine) Brooke [name], [facility] (Pharmacist)   Chief Complaint:  Chief Complaint  Patient presents with  . Diabetes  . Hypertension    3 month follow up of chronic medical conditions     HPI: Brooke [name] is a 65 y.o. female presenting on 07/28/2020 for Diabetes and Hypertension (3 month follow up of chronic medical conditions  )  Diabetes: Patient presents for follow up of diabetes. Current symptoms include: hyperglycemia. Known diabetic complications: nephropathy. Medication compliance: Yes. Current diet: in general, an "unhealthy" diet.  Patient admits she has been drinking more soda than previously.  Current exercise: walking. Home blood sugar records: 180-240s. Is she  on ACE inhibitor or angiotensin II receptor blocker? Yes. Is she on a statin? Yes.    New complaints: Patient reports pain on the right side of her back.  Social history:  Relevant past medical, surgical, family and social history reviewed and updated as indicated. Interim medical history since our last visit reviewed.  Allergies and medications reviewed and updated.  DATA REVIEWED: CHART IN EPIC  ROS: Negative unless specifically indicated above in HPI.    Current Outpatient Medications:  .  albuterol (VENTOLIN HFA) 108 (90 Base) MCG/ACT inhaler, Inhale 2 puffs into the lungs every 6 (six) hours as needed for wheezing or shortness of breath., Disp: 18 g, Rfl: 2 .  amLODipine (NORVASC) 10 MG tablet, Take 1 tablet (10 mg total) by mouth daily., Disp: 90 tablet, Rfl: 1 .  aspirin EC 81 MG tablet, Take 81 mg by mouth daily. Swallow whole., Disp: , Rfl:  .  atenolol (TENORMIN) 100 MG tablet, Take 1 tablet (100 mg total) by mouth every morning., Disp: 90 tablet, Rfl: 1 .  budesonide-formoterol (SYMBICORT) 160-4.5 MCG/ACT inhaler, Inhale 2 puffs into the lungs 2 (two) times daily., Disp: 3 each, Rfl: 1 .  Dulaglutide (TRULICITY) 4.5 WS/5.6CL SOPN, Inject 4.5  mg as directed once a week., Disp: 6 mL, Rfl: 1 .  empagliflozin (JARDIANCE) 25 MG TABS tablet, Take 1 tablet (25 mg total) by mouth daily before breakfast., Disp: 30 tablet, Rfl: 2 .  glipiZIDE (GLUCOTROL) 10 MG tablet, Take 2 tablets (20 mg total) by mouth 2 (two) times daily before a meal., Disp: 360 tablet, Rfl: 1 .  lisinopril-hydrochlorothiazide (ZESTORETIC) 20-25 MG tablet, Take 1 tablet  by mouth daily., Disp: 90 tablet, Rfl: 1 .  lovastatin (MEVACOR) 40 MG tablet, Take 1.5 tablets (60 mg total) by mouth at bedtime., Disp: 135 tablet, Rfl: 1 .  metFORMIN (GLUCOPHAGE-XR) 500 MG 24 hr tablet, Take 1 tablet (500 mg total) by mouth 2 (two) times daily with a meal., Disp: 180 tablet, Rfl: 1   No Known Allergies Past Medical History:  Diagnosis Date  . Bronchitis    frequent bronchitis  . Diabetes mellitus   . Diabetic nephropathy ([place])   . Hypertension   . Obesity (BMI 35.0-39.9 without comorbidity)   . Vertigo     Past Surgical History:  Procedure Laterality Date  . BREAST SURGERY Right 1974    Social History   Socioeconomic History  . Marital status: Married    Spouse name: Brooke [name]  . Number of children: 1  . Years of education: Not on file  . Highest education level: Not on file  Occupational History  . Not on file  Tobacco Use  . Smoking status: Never Smoker  . Smokeless tobacco: Never Used  Vaping Use  . Vaping Use: Never used  Substance and Sexual Activity  . Alcohol use: No  . Drug use: No  . Sexual activity: Never  Other Topics Concern  . Not on file  Social History Narrative  . Not on file   Social Determinants of Health   Financial Resource Strain: Not on file  Food Insecurity: Not on file  Transportation Needs: Not on file  Physical Activity: Not on file  Stress: Not on file  Social Connections: Not on file  Intimate Partner Violence: Not on file        Objective:    BP (!) 141/79   Pulse 73   Temp (!) 97.4 F (36.3 C) (Temporal)   Ht _0  (1.676 m)   Wt 201 lb 3.2 oz (91.3 kg)   LMP 03/16/2012   SpO2 98%   BMI 32.47 kg/m   Wt Readings from Last 3 Encounters:  07/28/20 201 lb 3.2 oz (91.3 kg)  05/16/20 198 lb (89.8 kg)  04/14/20 198 lb (89.8 kg)    Physical Exam Vitals reviewed.  Constitutional:      General: She is not in acute distress.    Appearance: Normal appearance. She is obese. She is not ill-appearing,  toxic-appearing or diaphoretic.  HENT:     Head: Normocephalic and atraumatic.  Eyes:     General: No scleral icterus.       Right eye: No discharge.        Left eye: No discharge.     Conjunctiva/sclera: Conjunctivae normal.  Cardiovascular:     Rate and Rhythm: Normal rate and regular rhythm.     Heart sounds: Normal heart sounds. No murmur heard. No friction rub. No gallop.   Pulmonary:     Effort: Pulmonary effort is normal. No respiratory distress.     Breath sounds: Normal breath sounds. No stridor. No wheezing, rhonchi or rales.  Musculoskeletal:        General: Normal range  of motion.     Cervical back: Normal range of motion.     Thoracic back: Tenderness (muscular) present.  Skin:    General: Skin is warm and dry.     Capillary Refill: Capillary refill takes less than 2 seconds.  Neurological:     General: No focal deficit present.     Mental Status: She is alert and oriented to person, place, and time. Mental status is at baseline.  Psychiatric:        Mood and Affect: Mood normal.        Behavior: Behavior normal.        Thought Content: Thought content normal.        Judgment: Judgment normal.    Diabetic Foot Exam - Simple   No data filed      Lab Results  Component Value Date   TSH 1.880 03/13/2019   Lab Results  Component Value Date   WBC 15.2 (H) 05/16/2020   HGB 13.4 05/16/2020   HCT 43.6 05/16/2020   MCV 87.0 05/16/2020   PLT 400 05/16/2020   Lab Results  Component Value Date   NA 135 05/16/2020   K 4.6 05/16/2020   CO2 24 05/16/2020   GLUCOSE 334 (H) 05/16/2020   BUN 21 05/16/2020   CREATININE 1.44 (H) 05/16/2020   BILITOT 0.6 05/16/2020   ALKPHOS 69 05/16/2020   AST 21 05/16/2020   ALT 21 05/16/2020   PROT 7.5 05/16/2020   ALBUMIN 3.6 05/16/2020   CALCIUM 9.0 05/16/2020   ANIONGAP 11 05/16/2020   Lab Results  Component Value Date   CHOL 205 (H) 04/14/2020   Lab Results  Component Value Date   HDL 42 04/14/2020   Lab  Results  Component Value Date   LDLCALC 126 (H) 04/14/2020   Lab Results  Component Value Date   TRIG 210 (H) 04/14/2020   Lab Results  Component Value Date   CHOLHDL 4.9 (H) 04/14/2020   Lab Results  Component Value Date   HGBA1C 10.1 (H) 04/14/2020

## 2020-07-29 ENCOUNTER — Telehealth: Payer: Self-pay | Admitting: Family Medicine

## 2020-07-29 LAB — CBC WITH DIFFERENTIAL/PLATELET
Basophils Absolute: 0.1 10*3/uL (ref 0.0–0.2)
Basos: 1 %
EOS (ABSOLUTE): 0.9 10*3/uL — ABNORMAL HIGH (ref 0.0–0.4)
Eos: 10 %
Hematocrit: 38.4 % (ref 34.0–46.6)
Hemoglobin: 12.2 g/dL (ref 11.1–15.9)
Immature Grans (Abs): 0 10*3/uL (ref 0.0–0.1)
Immature Granulocytes: 0 %
Lymphocytes Absolute: 3.4 10*3/uL — ABNORMAL HIGH (ref 0.7–3.1)
Lymphs: 39 %
MCH: 26.5 pg — ABNORMAL LOW (ref 26.6–33.0)
MCHC: 31.8 g/dL (ref 31.5–35.7)
MCV: 83 fL (ref 79–97)
Monocytes Absolute: 0.6 10*3/uL (ref 0.1–0.9)
Monocytes: 7 %
Neutrophils Absolute: 3.8 10*3/uL (ref 1.4–7.0)
Neutrophils: 43 %
Platelets: 398 10*3/uL (ref 150–450)
RBC: 4.61 x10E6/uL (ref 3.77–5.28)
RDW: 13.9 % (ref 11.7–15.4)
WBC: 8.8 10*3/uL (ref 3.4–10.8)

## 2020-07-29 LAB — CMP14+EGFR
ALT: 24 IU/L (ref 0–32)
AST: 22 IU/L (ref 0–40)
Albumin/Globulin Ratio: 1.4 (ref 1.2–2.2)
Albumin: 4.2 g/dL (ref 3.8–4.8)
Alkaline Phosphatase: 90 IU/L (ref 44–121)
BUN/Creatinine Ratio: 13 (ref 12–28)
BUN: 20 mg/dL (ref 8–27)
Bilirubin Total: 0.2 mg/dL (ref 0.0–1.2)
CO2: 22 mmol/L (ref 20–29)
Calcium: 9.4 mg/dL (ref 8.7–10.3)
Chloride: 102 mmol/L (ref 96–106)
Creatinine, Ser: 1.57 mg/dL — ABNORMAL HIGH (ref 0.57–1.00)
Globulin, Total: 2.9 g/dL (ref 1.5–4.5)
Glucose: 148 mg/dL — ABNORMAL HIGH (ref 65–99)
Potassium: 5 mmol/L (ref 3.5–5.2)
Sodium: 139 mmol/L (ref 134–144)
Total Protein: 7.1 g/dL (ref 6.0–8.5)
eGFR: 36 mL/min/{1.73_m2} — ABNORMAL LOW (ref >59)

## 2020-07-29 LAB — LIPID PANEL
Chol/HDL Ratio: 4.5 ratio — ABNORMAL HIGH (ref 0.0–4.4)
Cholesterol, Total: 176 mg/dL (ref 100–199)
HDL: 39 mg/dL — ABNORMAL LOW (ref >39)
LDL Chol Calc (NIH): 83 mg/dL (ref 0–99)
Triglycerides: 328 mg/dL — ABNORMAL HIGH (ref 0–149)
VLDL Cholesterol Cal: 54 mg/dL — ABNORMAL HIGH (ref 5–40)

## 2020-07-29 NOTE — Telephone Encounter (Signed)
  Prescription Request  07/29/2020  What is the name of the medication or equipment? Pt wants samples of insulin and she wants to speak with julie about this  Have you contacted your pharmacy to request a refill? (if applicable) na  Which pharmacy would you like this sent to? na   Patient notified that their request is being sent to the clinical staff for review and that they should receive a response within 2 business days.

## 2020-07-29 NOTE — Telephone Encounter (Signed)
Requesting to talk to [facility]

## 2020-07-29 NOTE — Telephone Encounter (Signed)
Patient can set up appt with me if she needs prescription assistance There are tresiba samples if refrigerator Prescription assistance must be completed every year

## 2020-07-30 NOTE — Telephone Encounter (Signed)
Attempted to contact patient - NA °

## 2020-07-31 ENCOUNTER — Ambulatory Visit (INDEPENDENT_AMBULATORY_CARE_PROVIDER_SITE_OTHER): Payer: Medicare HMO | Admitting: Pharmacist

## 2020-07-31 DIAGNOSIS — E119 Type 2 diabetes mellitus without complications: Secondary | ICD-10-CM

## 2020-07-31 MED ORDER — INSULIN PEN NEEDLE 32G X 6 MM MISC: 11 refills | Status: DC

## 2020-07-31 NOTE — Progress Notes (Signed)
    07/31/2020 Name: Brooke [name] MRN: [id number] DOB: [birth year]   S:  40yoF Presents for diabetes evaluation, education, and management Patient was referred and last seen by Primary Care Provider on 07/28/20.  Insurance coverage/medication affordability: HUMANA  Patient reports adherence with medications. . Current diabetes medications include: trulicity, jardiance, metformin . Current hypertension medications include: atenolol, norvasc, lisinopril/hctz Goal 130/80 . Current hyperlipidemia medications include: lovastatin   Patient denies hypoglycemic events.   Discussed meal planning options and Plate method for healthy eating . Avoid sugary drinks and desserts . Incorporate balanced protein, non starchy veggies, 1 serving of carbohydrate with each meal . Increase water intake . Increase physical activity as able  O:  Lab Results  Component Value Date   HGBA1C 11.4 (H) 07/28/2020   Lipid Panel     Component Value Date/Time   CHOL 176 07/28/2020 1430   TRIG 328 (H) 07/28/2020 1430   HDL 39 (L) 07/28/2020 1430   CHOLHDL 4.5 (H) 07/28/2020 1430   LDLCALC 83 07/28/2020 1430    Home fasting blood sugars: 200-300  2 hour post-meal/random blood sugars: N/A.    Clinical Atherosclerotic Cardiovascular Disease (ASCVD): No   The 10-year ASCVD risk score Mikey Bussing DC Jr., et al., 2013) is: 25%   Values used to calculate the score:     Age: 65 years     Sex: Female     Is Non-Hispanic African American: Yes     Diabetic: Yes     Tobacco smoker: No     Systolic Blood Pressure: 828 mmHg     Is BP treated: Yes     HDL Cholesterol: 39 mg/dL     Total Cholesterol: 176 mg/dL   A/P:  Diabetes T2DM currently UNCONTROLLED.  Patient is adherent with medication. Control is suboptimal due to diet/lifestyle.  -starting basal insulin today--tresiba samples-->10 units into the skin at bedtime  APPLICATION FOR BASAGLAR SUBMITTED VIA [facility] PATIENT ASSISTANCE  PATIENT GETS  OTHER MEDICATIONS VIA HEALTH DEPT  WILL TRANSITION TO BASAGLAR 10 UNITS AND TITRATE ACCORDINGLY  -Continue trulicity 4.5mg   Denies personal and family history of Medullary thyroid cancer (MTC)  -Continue jardiance  -Continue metformin but watch MKL49  -Extensively discussed pathophysiology of diabetes, recommended lifestyle interventions, dietary effects on blood sugar control  -Counseled on s/sx of and management of hypoglycemia  -Next A1C anticipated 3 MONTHS.  Written patient instructions provided.  Total time in counseling 15 minutes.   Regina Eck, PharmD, BCPS Clinical Pharmacist, [place]  [facility] Phone [phone]

## 2020-08-05 ENCOUNTER — Ambulatory Visit: Payer: Medicare HMO | Admitting: Family Medicine

## 2020-08-11 NOTE — Telephone Encounter (Signed)
NA Attempts have been made with no call back  Has up coming appointment  This encounter will be closed.

## 2020-08-19 ENCOUNTER — Encounter: Payer: Self-pay | Admitting: Family Medicine

## 2020-08-19 ENCOUNTER — Ambulatory Visit (INDEPENDENT_AMBULATORY_CARE_PROVIDER_SITE_OTHER): Payer: Medicare HMO | Admitting: Family Medicine

## 2020-08-19 DIAGNOSIS — E1165 Type 2 diabetes mellitus with hyperglycemia: Secondary | ICD-10-CM

## 2020-08-19 DIAGNOSIS — Z794 Long term (current) use of insulin: Secondary | ICD-10-CM | POA: Diagnosis not present

## 2020-08-19 NOTE — Progress Notes (Signed)
Virtual Visit via Telephone Note  I connected with Brooke [name] on 08/19/20 at 4:46 PM by telephone and verified that I am speaking with the correct person using two identifiers. Brooke [name] is currently located at the nursing home and nobody is currently with her during this visit. The provider, [name], FNP is located in their office at time of visit.  I discussed the limitations, risks, security and privacy concerns of performing an evaluation and management service by telephone and the availability of in person appointments. I also discussed with the patient that there may be a patient responsible charge related to this service. The patient expressed understanding and agreed to proceed.  Subjective: PCP: [name], FNP  Chief Complaint  Patient presents with   Diabetes   Patient has been checking her blood sugar readings fasting x2 weeks. She gave the following readings:  189 193 221 183 181 144 151 181 164 154  77 144 163 178 172 172  She is taking Basaglar 10 units nightly.    ROS: Per HPI  Current Outpatient Medications:    albuterol (VENTOLIN HFA) 108 (90 Base) MCG/ACT inhaler, Inhale 2 puffs into the lungs every 6 (six) hours as needed for wheezing or shortness of breath., Disp: 18 g, Rfl: 2   amLODipine (NORVASC) 10 MG tablet, Take 1 tablet (10 mg total) by mouth daily., Disp: 90 tablet, Rfl: 1   aspirin EC 81 MG tablet, Take 81 mg by mouth daily. Swallow whole., Disp: , Rfl:    atenolol (TENORMIN) 100 MG tablet, Take 1 tablet (100 mg total) by mouth every morning., Disp: 90 tablet, Rfl: 1   budesonide-formoterol (SYMBICORT) 160-4.5 MCG/ACT inhaler, Inhale 2 puffs into the lungs 2 (two) times daily., Disp: 3 each, Rfl: 1   Dulaglutide (TRULICITY) 4.5 KX/3.8HW SOPN, Inject 4.5 mg as directed once a week., Disp: 6 mL, Rfl: 1   empagliflozin (JARDIANCE) 25 MG TABS tablet, Take 1 tablet (25 mg total) by mouth daily before breakfast., Disp:  30 tablet, Rfl: 2   Insulin Glargine (BASAGLAR KWIKPEN) 100 UNIT/ML, Inject 10 Units into the skin at bedtime., Disp: , Rfl:    Insulin Pen Needle 32G X 6 MM MISC, Use to inject insulin once daily. DX:E11.65, Disp: 100 each, Rfl: 11   lisinopril-hydrochlorothiazide (ZESTORETIC) 20-25 MG tablet, Take 1 tablet by mouth daily., Disp: 90 tablet, Rfl: 1   lovastatin (MEVACOR) 40 MG tablet, Take 1.5 tablets (60 mg total) by mouth at bedtime., Disp: 135 tablet, Rfl: 1   metFORMIN (GLUCOPHAGE-XR) 500 MG 24 hr tablet, Take 1 tablet (500 mg total) by mouth 2 (two) times daily with a meal., Disp: 180 tablet, Rfl: 1  No Known Allergies Past Medical History:  Diagnosis Date   Bronchitis    frequent bronchitis   Diabetes mellitus    Diabetic nephropathy (HCC)    Hypertension    Obesity (BMI 35.0-39.9 without comorbidity)    Vertigo     Observations/Objective: A&O  No respiratory distress or wheezing audible over the phone Mood, judgement, and thought processes all WNL   Assessment and Plan: 1. Type 2 diabetes mellitus with hyperglycemia, with long-term current use of insulin (HCC) Advised to increase Basaglar from 10 units to 12 units at bedtime. Continue checking fasting blood glucose daily.    Follow Up Instructions: Return in 1 week (on 08/26/2020) for DM.  I discussed the assessment and treatment plan with the patient. The patient was provided an opportunity to ask questions and  all were answered. The patient agreed with the plan and demonstrated an understanding of the instructions.   The patient was advised to call back or seek an in-person evaluation if the symptoms worsen or if the condition fails to improve as anticipated.  The above assessment and management plan was discussed with the patient. The patient verbalized understanding of and has agreed to the management plan. Patient is aware to call the clinic if symptoms persist or worsen. Patient is aware when to return to the clinic  for a follow-up visit. Patient educated on when it is appropriate to go to the emergency department.   Time call ended: 4:54 PM  I provided 8 minutes of non-face-to-face time during this encounter.  [name], MSN, APRN, FNP-C [facility] 08/19/20

## 2020-08-26 ENCOUNTER — Encounter: Payer: Medicare HMO | Admitting: Family Medicine

## 2020-08-26 NOTE — Progress Notes (Signed)
Phone number not ringing.

## 2020-08-27 ENCOUNTER — Telehealth: Payer: Self-pay | Admitting: Family Medicine

## 2020-09-03 ENCOUNTER — Ambulatory Visit (INDEPENDENT_AMBULATORY_CARE_PROVIDER_SITE_OTHER): Payer: Medicare HMO | Admitting: Family Medicine

## 2020-09-03 DIAGNOSIS — E1165 Type 2 diabetes mellitus with hyperglycemia: Secondary | ICD-10-CM | POA: Diagnosis not present

## 2020-09-03 DIAGNOSIS — Z794 Long term (current) use of insulin: Secondary | ICD-10-CM

## 2020-09-03 NOTE — Progress Notes (Signed)
Virtual Visit via Telephone Note  I connected with Brooke [name] on 09/03/20 at 3:53 PM by telephone and verified that I am speaking with the correct person using two identifiers. Brooke [name] is currently located at home and nobody is currently with her during this visit. The provider, [name], FNP is located in their office at time of visit.  I discussed the limitations, risks, security and privacy concerns of performing an evaluation and management service by telephone and the availability of in person appointments. I also discussed with the patient that there may be a patient responsible charge related to this service. The patient expressed understanding and agreed to proceed.  Subjective: PCP: [name], FNP  Chief Complaint  Patient presents with   Diabetes   Patient is following up with blood sugar readings since increasing Basaglar from 10 to 12 units. She has been snacking a lot in the middle of the night. She gives the following readings (fasting):  224 210 174 180 201 175 181 179 177 179  171 189 191 179 179   ROS: Per HPI  Current Outpatient Medications:    albuterol (VENTOLIN HFA) 108 (90 Base) MCG/ACT inhaler, Inhale 2 puffs into the lungs every 6 (six) hours as needed for wheezing or shortness of breath., Disp: 18 g, Rfl: 2   amLODipine (NORVASC) 10 MG tablet, Take 1 tablet (10 mg total) by mouth daily., Disp: 90 tablet, Rfl: 1   aspirin EC 81 MG tablet, Take 81 mg by mouth daily. Swallow whole., Disp: , Rfl:    atenolol (TENORMIN) 100 MG tablet, Take 1 tablet (100 mg total) by mouth every morning., Disp: 90 tablet, Rfl: 1   budesonide-formoterol (SYMBICORT) 160-4.5 MCG/ACT inhaler, Inhale 2 puffs into the lungs 2 (two) times daily., Disp: 3 each, Rfl: 1   Dulaglutide (TRULICITY) 4.5 QQ/2.2LN SOPN, Inject 4.5 mg as directed once a week., Disp: 6 mL, Rfl: 1   empagliflozin (JARDIANCE) 25 MG TABS tablet, Take 1 tablet (25 mg total) by  mouth daily before breakfast., Disp: 30 tablet, Rfl: 2   Insulin Glargine (BASAGLAR KWIKPEN) 100 UNIT/ML, Inject 12 Units into the skin at bedtime., Disp: , Rfl:    Insulin Pen Needle 32G X 6 MM MISC, Use to inject insulin once daily. DX:E11.65, Disp: 100 each, Rfl: 11   lisinopril-hydrochlorothiazide (ZESTORETIC) 20-25 MG tablet, Take 1 tablet by mouth daily., Disp: 90 tablet, Rfl: 1   lovastatin (MEVACOR) 40 MG tablet, Take 1.5 tablets (60 mg total) by mouth at bedtime., Disp: 135 tablet, Rfl: 1   metFORMIN (GLUCOPHAGE-XR) 500 MG 24 hr tablet, Take 1 tablet (500 mg total) by mouth 2 (two) times daily with a meal., Disp: 180 tablet, Rfl: 1  No Known Allergies Past Medical History:  Diagnosis Date   Bronchitis    frequent bronchitis   Diabetes mellitus    Diabetic nephropathy (HCC)    Hypertension    Obesity (BMI 35.0-39.9 without comorbidity)    Vertigo     Observations/Objective: A&O  No respiratory distress or wheezing audible over the phone Mood, judgement, and thought processes all WNL   Assessment and Plan: 1. Type 2 diabetes mellitus with hyperglycemia, with long-term current use of insulin (HCC) Uncontrolled. Basaglar increased from 12 to 14 units once daily. Encouraged a healthier diet.    Follow Up Instructions: Return in 13 days (on 09/16/2020) for DM.  I discussed the assessment and treatment plan with the patient. The patient was provided an opportunity to ask  questions and all were answered. The patient agreed with the plan and demonstrated an understanding of the instructions.   The patient was advised to call back or seek an in-person evaluation if the symptoms worsen or if the condition fails to improve as anticipated.  The above assessment and management plan was discussed with the patient. The patient verbalized understanding of and has agreed to the management plan. Patient is aware to call the clinic if symptoms persist or worsen. Patient is aware when to  return to the clinic for a follow-up visit. Patient educated on when it is appropriate to go to the emergency department.   Time call ended: 4:00 PM  I provided 7 minutes of non-face-to-face time during this encounter.  [name], MSN, APRN, FNP-C [facility] 09/03/20

## 2020-09-04 ENCOUNTER — Ambulatory Visit (INDEPENDENT_AMBULATORY_CARE_PROVIDER_SITE_OTHER): Payer: Medicare HMO | Admitting: Pharmacist

## 2020-09-04 ENCOUNTER — Encounter: Payer: Self-pay | Admitting: Family Medicine

## 2020-09-04 DIAGNOSIS — E785 Hyperlipidemia, unspecified: Secondary | ICD-10-CM | POA: Diagnosis not present

## 2020-09-04 DIAGNOSIS — E119 Type 2 diabetes mellitus without complications: Secondary | ICD-10-CM

## 2020-09-04 NOTE — Progress Notes (Signed)
Chronic Care Management Pharmacy Note  09/04/2020 Name:  Brooke [name] MRN:  [id number] DOB:  10-Apr-1955  Summary: DIABETES MANAGEMENT  Recommendations/Changes made from today's visit: Diabetes: Uncontrolled; current treatment:INCREASED BASAGLAR TO 14 UNITS, TRULICITY 7.8GN, METFORMIN-GFR 36, JARDIANCE;  PATIENT REPORTS SHE FEELS BETTER HER FBG IS FINALLY <200 SHE IS WORKING ON CONTROLLING HER DIABETES Denies personal and family history of Medullary thyroid cancer (MTC) Current glucose readings: fasting glucose: <200, post prandial glucose: N/A, NOT CHECKING Denies hypoglycemic/hyperglycemic symptoms Discussed meal planning options and Plate method for healthy eating Current exercise: N/A Educated on DIABETES MEDICATIONS, BG READINGS Assessed patient finances. PATIENT USING HEALTH DEPARTMENT FOR MEDICATIONS, INSTRUCTED PATIENT ON HOW TO CALL IN REFILLS, I WILL REACH OUT AT A LATER DATE TO MAKE SURE REFILLS ARE RECEIVED DUE TO HEALTH DEPT CHANGE IN STAFF   Plan: F/U IN 10/2020  Subjective: Brooke [name] is an 65 y.o. year old female who is a primary patient of [name], FNP.  The CCM team was consulted for assistance with disease management and care coordination needs.    Engaged with patient by telephone for initial visit in response to provider referral for pharmacy case management and/or care coordination services.   Consent to Services:  The patient was given the following information about Chronic Care Management services today, agreed to services, and gave verbal consent: 1. CCM service includes personalized support from designated clinical staff supervised by the primary care provider, including individualized plan of care and coordination with other care providers 2. 24/7 contact phone numbers for assistance for urgent and routine care needs. 3. Service will only be billed when office clinical staff spend 20 minutes or more in a month to coordinate care. 4. Only  one practitioner may furnish and bill the service in a calendar month. 5.The patient may stop CCM services at any time (effective at the end of the month) by phone call to the office staff. 6. The patient will be responsible for cost sharing (co-pay) of up to 20% of the service fee (after annual deductible is met). Patient agreed to services and consent obtained.  Patient Care Team: [name], FNP as PCP - General (Family Medicine) Lavera Guise, [facility] (Pharmacist)    Objective:  Lab Results  Component Value Date   CREATININE 1.57 (H) 07/28/2020   CREATININE 1.44 (H) 05/16/2020   CREATININE 1.59 (H) 04/14/2020    Lab Results  Component Value Date   HGBA1C 11.4 (H) 07/28/2020   Last diabetic Eye exam: No results found for: HMDIABEYEEXA  Last diabetic Foot exam: No results found for: HMDIABFOOTEX      Component Value Date/Time   CHOL 176 07/28/2020 1430   TRIG 328 (H) 07/28/2020 1430   HDL 39 (L) 07/28/2020 1430   CHOLHDL 4.5 (H) 07/28/2020 1430   LDLCALC 83 07/28/2020 1430    Hepatic Function Latest Ref Rng & Units 07/28/2020 05/16/2020 04/14/2020  Total Protein 6.0 - 8.5 g/dL 7.1 7.5 7.4  Albumin 3.8 - 4.8 g/dL 4.2 3.6 4.4  AST 0 - 40 IU/L '22 21 13  ' ALT 0 - 32 IU/L '24 21 20  ' Alk Phosphatase 44 - 121 IU/L 90 69 88  Total Bilirubin 0.0 - 1.2 mg/dL 0.2 0.6 0.2  Bilirubin, Direct 0.0 - 0.2 mg/dL - - -    Lab Results  Component Value Date/Time   TSH 1.880 03/13/2019 09:55 AM    CBC Latest Ref Rng & Units 07/28/2020 05/16/2020 04/14/2020  WBC 3.4 -  10.8 x10E3/uL 8.8 15.2(H) 9.1  Hemoglobin 11.1 - 15.9 g/dL 12.2 13.4 12.6  Hematocrit 34.0 - 46.6 % 38.4 43.6 38.9  Platelets 150 - 450 x10E3/uL 398 400 443    No results found for: VD25OH  Clinical ASCVD: No  The 10-year ASCVD risk score Mikey Bussing DC Jr., et al., 2013) is: 25%   Values used to calculate the score:     Age: 39 years     Sex: Female     Is Non-Hispanic African American: Yes     Diabetic: Yes     Tobacco  smoker: No     Systolic Blood Pressure: 962 mmHg     Is BP treated: Yes     HDL Cholesterol: 39 mg/dL     Total Cholesterol: 176 mg/dL    Other: (CHADS2VASc if Afib, PHQ9 if depression, MMRC or CAT for COPD, ACT, DEXA)  Social History   Tobacco Use  Smoking Status Never  Smokeless Tobacco Never   BP Readings from Last 3 Encounters:  07/28/20 (!) 141/79  05/16/20 139/68  04/14/20 137/80   Pulse Readings from Last 3 Encounters:  07/28/20 73  05/16/20 94  04/14/20 75   Wt Readings from Last 3 Encounters:  07/28/20 201 lb 3.2 oz (91.3 kg)  05/16/20 198 lb (89.8 kg)  04/14/20 198 lb (89.8 kg)    Assessment: Review of patient past medical history, allergies, medications, health status, including review of consultants reports, laboratory and other test data, was performed as part of comprehensive evaluation and provision of chronic care management services.   SDOH:  (Social Determinants of Health) assessments and interventions performed:    CCM Care Plan  No Known Allergies  Medications Reviewed Today     Reviewed by [name], FNP (Family Nurse Practitioner) on 09/03/20 at 1556  Med List Status: <None>   Medication Order Taking? Sig Documenting Provider Last Dose Status Informant  albuterol (VENTOLIN HFA) 108 (90 Base) MCG/ACT inhaler [id number] No Inhale 2 puffs into the lungs every 6 (six) hours as needed for wheezing or shortness of breath. [name], FNP Taking Active Self  amLODipine (NORVASC) 10 MG tablet [id number] No Take 1 tablet (10 mg total) by mouth daily. [name], FNP Taking Active   aspirin EC 81 MG tablet [id number] No Take 81 mg by mouth daily. Swallow whole. [provider] Taking Active Self  atenolol (TENORMIN) 100 MG tablet [id number] No Take 1 tablet (100 mg total) by mouth every morning. [name], FNP Taking Active   budesonide-formoterol [facility]) 160-4.5 MCG/ACT inhaler [id number] No Inhale 2 puffs into the lungs 2  (two) times daily. [name], FNP Taking Active   Dulaglutide (TRULICITY) 4.5 YK/5.9DJ SOPN [id number] No Inject 4.5 mg as directed once a week. [name], FNP Taking Active   empagliflozin (JARDIANCE) 25 MG TABS tablet [id number] No Take 1 tablet (25 mg total) by mouth daily before breakfast. [name], FNP Taking Active   Insulin Glargine [facility] KWIKPEN) 100 UNIT/ML [id number]  Inject 14 Units into the skin at bedtime. [provider]  Active   Insulin Pen Needle 32G X 6 MM MISC [id number]  Use to inject insulin once daily. DX:E11.65 [name], FNP  Active   lisinopril-hydrochlorothiazide (ZESTORETIC) 20-25 MG tablet [id number] No Take 1 tablet by mouth daily. [name], FNP Taking Active   lovastatin (MEVACOR) 40 MG tablet [id number] No Take 1.5 tablets (60 mg total) by mouth at bedtime. [name]  F, FNP Taking Active   metFORMIN (GLUCOPHAGE-XR) 500 MG 24 hr tablet [id number] No Take 1 tablet (500 mg total) by mouth 2 (two) times daily with a meal. [name], FNP Taking Active             Patient Active Problem List   Diagnosis Date Noted   Diabetic nephropathy ([place])    CKD stage 3 due to type 2 diabetes mellitus ([place]) 03/14/2019   Type 2 diabetes mellitus with hyperglycemia, with long-term current use of insulin ([place]) 03/13/2019   Hyperlipidemia associated with type 2 diabetes mellitus ([place]) 03/13/2019   Hypertension associated with diabetes ([place]) 03/13/2019   Obesity (BMI 30-39.9) 03/13/2019   Chronic bronchitis ([place]) 03/13/2019    Immunization History  Administered Date(s) Administered   Fluad Quad(high Dose 65+) 03/13/2019, 11/28/2019   PFIZER(Purple Top)SARS-COV-2 Vaccination 04/12/2019, 05/03/2019, 01/14/2020    Conditions to be addressed/monitored: HTN, HLD, DMII, and CKD Stage 3  Care Plan : PHARMD MEDICATION MANAGEMENT  Updates made by Lavera Guise, [place] since 09/04/2020 12:00 AM     Problem: DISEASE PROGRESSION  PREVENTION      Long-Range Goal: T2DM   This Visit's Progress: Not on track  Priority: High  Note:   Current Barriers:  Unable to independently afford treatment regimen Unable to achieve control of T2DM    Pharmacist Clinical Goal(s):  Over the next 90 days, patient will verbalize ability to afford treatment regimen maintain control of T2DM as evidenced by GOAL A1C<7%  through collaboration with PharmD and provider.    Interventions: 1:1 collaboration with [name], FNP regarding development and update of comprehensive plan of care as evidenced by provider attestation and co-signature Inter-disciplinary care team collaboration (see longitudinal plan of care) Comprehensive medication review performed; medication list updated in electronic medical record  Diabetes: Uncontrolled; current treatment:INCREASED BASAGLAR TO 14 UNITS, TRULICITY 1.9JK, METFORMIN, JARDIANCE;  PATIENT REPORTS SHE FEELS BETTER HER FBG IS FINALLY <200 SHE IS WORKING ON CONTROLLING HER DIABETES Denies personal and family history of Medullary thyroid cancer (MTC) Current glucose readings: fasting glucose: <200, post prandial glucose: N/A, NOT CHECKING Denies hypoglycemic/hyperglycemic symptoms Discussed meal planning options and Plate method for healthy eating Avoid sugary drinks and desserts Incorporate balanced protein, non starchy veggies, 1 serving of carbohydrate with each meal Increase water intake Increase physical activity as able Current exercise: N/A Educated on DIABETES MEDICATIONS, BG READINGS Assessed patient finances. PATIENT USING HEALTH DEPARTMENT FOR MEDICATIONS, INSTRUCTED PATIENT ON HOW TO CALL IN REFILLS, I WILL REACH OUT AT A LATER DATE TO MAKE SURE REFILLS ARE RECEIVED DUE TO HEALTH DEPT CHANGE IN STAFF   Patient Goals/Self-Care Activities Over the next 90 days, patient will:  - take medications as prescribed check glucose DAILY, document, and provide at future  appointments  Follow Up Plan: Telephone follow up appointment with care management team member scheduled for: 10/21/20      Medication Assistance:  USING HEALTH DEPT NOW, BUT WILL LIKELY NEED OUR SERVICES IN THE FUTURE  Patient's preferred pharmacy is:  [facility] [address], [address] [place]  [address] Phone: [phone] Fax: [phone]   Follow Up:  Patient agrees to Care Plan and Follow-up.  Plan: Telephone follow up appointment with care management team member scheduled for:  10/2020  SIG Regina Eck, PharmD, BCPS Clinical Pharmacist, [place]  II Phone [phone]

## 2020-09-04 NOTE — Patient Instructions (Signed)
Visit Information  PATIENT GOALS:  Goals Addressed               This Visit's Progress     Patient Stated     T2DM (pt-stated)        Current Barriers:  Unable to independently afford treatment regimen Unable to achieve control of T2DM    Pharmacist Clinical Goal(s):  Over the next 90 days, patient will verbalize ability to afford treatment regimen maintain control of T2DM as evidenced by GOAL A1C<7%  through collaboration with PharmD and provider.    Interventions: 1:1 collaboration with [name], FNP regarding development and update of comprehensive plan of care as evidenced by provider attestation and co-signature Inter-disciplinary care team collaboration (see longitudinal plan of care) Comprehensive medication review performed; medication list updated in electronic medical record  Diabetes: Uncontrolled; current treatment:INCREASED BASAGLAR TO 14 UNITS, TRULICITY 4.5MG , METFORMIN, JARDIANCE;  PATIENT REPORTS SHE FEELS BETTER HER FBG IS FINALLY <200 SHE IS WORKING ON CONTROLLING HER DIABETES Denies personal and family history of Medullary thyroid cancer (MTC) Current glucose readings: fasting glucose: <200, post prandial glucose: N/A, NOT CHECKING Denies hypoglycemic/hyperglycemic symptoms Discussed meal planning options and Plate method for healthy eating Avoid sugary drinks and desserts Incorporate balanced protein, non starchy veggies, 1 serving of carbohydrate with each meal Increase water intake Increase physical activity as able Current exercise: N/A Educated on DIABETES MEDICATIONS, BG READINGS Assessed patient finances. PATIENT USING HEALTH DEPARTMENT FOR MEDICATIONS, INSTRUCTED PATIENT ON HOW TO CALL IN REFILLS, I WILL REACH OUT AT A LATER DATE TO MAKE SURE REFILLS ARE RECEIVED DUE TO HEALTH DEPT CHANGE IN STAFF   Patient Goals/Self-Care Activities Over the next 90 days, patient will:  - take medications as prescribed check glucose DAILY,  document, and provide at future appointments  Follow Up Plan: Telephone follow up appointment with care management team member scheduled for: 10/21/20          The patient verbalized understanding of instructions, educational materials, and care plan provided today and declined offer to receive copy of patient instructions, educational materials, and care plan.   Telephone follow up appointment with care management team member scheduled for: 10/21/20  Signature Regina Eck, PharmD, BCPS Clinical Pharmacist, [place]  II Phone [phone]

## 2020-09-17 ENCOUNTER — Ambulatory Visit: Payer: Medicare HMO | Admitting: Family Medicine

## 2020-09-24 ENCOUNTER — Telehealth: Payer: Self-pay | Admitting: Family Medicine

## 2020-09-24 ENCOUNTER — Encounter: Payer: Self-pay | Admitting: Family Medicine

## 2020-09-24 ENCOUNTER — Ambulatory Visit (INDEPENDENT_AMBULATORY_CARE_PROVIDER_SITE_OTHER): Payer: Medicare HMO | Admitting: Family Medicine

## 2020-09-24 DIAGNOSIS — E119 Type 2 diabetes mellitus without complications: Secondary | ICD-10-CM

## 2020-09-24 NOTE — Progress Notes (Signed)
Virtual Visit via Telephone Note  I connected with Brooke [name] on 09/24/20 at 1:40 PM by telephone and verified that I am speaking with the correct person using two identifiers. Brooke [name] is currently located at home and nobody is currently with her during this visit. The provider, [name], FNP is located in their office at time of visit.  I discussed the limitations, risks, security and privacy concerns of performing an evaluation and management service by telephone and the availability of in person appointments. I also discussed with the patient that there may be a patient responsible charge related to this service. The patient expressed understanding and agreed to proceed.  Subjective: PCP: [name], FNP  Chief Complaint  Patient presents with   Diabetes   Patient is following up on diabetes.  We have been titrating her Lantus for blood sugars >150.  She is currently using 14 units once daily.  She gives the following recent fasting blood sugar readings: 136 152 166 139 174 179 155   ROS: Per HPI  Current Outpatient Medications:    albuterol (VENTOLIN HFA) 108 (90 Base) MCG/ACT inhaler, Inhale 2 puffs into the lungs every 6 (six) hours as needed for wheezing or shortness of breath., Disp: 18 g, Rfl: 2   amLODipine (NORVASC) 10 MG tablet, Take 1 tablet (10 mg total) by mouth daily., Disp: 90 tablet, Rfl: 1   aspirin EC 81 MG tablet, Take 81 mg by mouth daily. Swallow whole., Disp: , Rfl:    atenolol (TENORMIN) 100 MG tablet, Take 1 tablet (100 mg total) by mouth every morning., Disp: 90 tablet, Rfl: 1   budesonide-formoterol (SYMBICORT) 160-4.5 MCG/ACT inhaler, Inhale 2 puffs into the lungs 2 (two) times daily., Disp: 3 each, Rfl: 1   Dulaglutide (TRULICITY) 4.5 TF/5.7DU SOPN, Inject 4.5 mg as directed once a week., Disp: 6 mL, Rfl: 1   empagliflozin (JARDIANCE) 25 MG TABS tablet, Take 1 tablet (25 mg total) by mouth daily before breakfast., Disp: 30  tablet, Rfl: 2   Insulin Glargine (BASAGLAR KWIKPEN) 100 UNIT/ML, Inject 14 Units into the skin at bedtime., Disp: , Rfl:    Insulin Pen Needle 32G X 6 MM MISC, Use to inject insulin once daily. DX:E11.65, Disp: 100 each, Rfl: 11   lisinopril-hydrochlorothiazide (ZESTORETIC) 20-25 MG tablet, Take 1 tablet by mouth daily., Disp: 90 tablet, Rfl: 1   lovastatin (MEVACOR) 40 MG tablet, Take 1.5 tablets (60 mg total) by mouth at bedtime., Disp: 135 tablet, Rfl: 1   metFORMIN (GLUCOPHAGE-XR) 500 MG 24 hr tablet, Take 1 tablet (500 mg total) by mouth 2 (two) times daily with a meal., Disp: 180 tablet, Rfl: 1  No Known Allergies Past Medical History:  Diagnosis Date   Bronchitis    frequent bronchitis   Diabetes mellitus    Diabetic nephropathy (HCC)    Hypertension    Obesity (BMI 35.0-39.9 without comorbidity)    Vertigo     Observations/Objective: A&O  No respiratory distress or wheezing audible over the phone Mood, judgement, and thought processes all WNL   Assessment and Plan: 1. Type 2 diabetes mellitus without complication, without long-term current use of insulin (HCC) Uncontrolled.  Lantus increased from 14 units to 16 units once daily.   Follow Up Instructions: Return in about 6 weeks (around 11/05/2020) for DM.  I discussed the assessment and treatment plan with the patient. The patient was provided an opportunity to ask questions and all were answered. The patient agreed with  the plan and demonstrated an understanding of the instructions.   The patient was advised to call back or seek an in-person evaluation if the symptoms worsen or if the condition fails to improve as anticipated.  The above assessment and management plan was discussed with the patient. The patient verbalized understanding of and has agreed to the management plan. Patient is aware to call the clinic if symptoms persist or worsen. Patient is aware when to return to the clinic for a follow-up visit. Patient  educated on when it is appropriate to go to the emergency department.   Time call ended: 1:46 PM  I provided 6 minutes of non-face-to-face time during this encounter.  [name], MSN, APRN, FNP-C [facility] 09/24/20

## 2020-09-24 NOTE — Telephone Encounter (Signed)
I have called patient back and discussed.

## 2020-10-21 ENCOUNTER — Telehealth: Payer: Self-pay

## 2020-10-22 ENCOUNTER — Encounter: Payer: Self-pay | Admitting: Family Medicine

## 2020-10-25 ENCOUNTER — Other Ambulatory Visit: Payer: Self-pay | Admitting: Family Medicine

## 2020-10-25 DIAGNOSIS — I152 Hypertension secondary to endocrine disorders: Secondary | ICD-10-CM

## 2020-10-25 DIAGNOSIS — E1159 Type 2 diabetes mellitus with other circulatory complications: Secondary | ICD-10-CM

## 2020-11-02 ENCOUNTER — Other Ambulatory Visit: Payer: Self-pay | Admitting: Family Medicine

## 2020-11-02 DIAGNOSIS — J410 Simple chronic bronchitis: Secondary | ICD-10-CM

## 2020-11-04 ENCOUNTER — Telehealth: Payer: Medicare HMO

## 2020-11-05 ENCOUNTER — Other Ambulatory Visit: Payer: Self-pay

## 2020-11-05 ENCOUNTER — Ambulatory Visit (INDEPENDENT_AMBULATORY_CARE_PROVIDER_SITE_OTHER): Payer: Medicare HMO | Admitting: Family Medicine

## 2020-11-05 ENCOUNTER — Encounter: Payer: Self-pay | Admitting: Family Medicine

## 2020-11-05 VITALS — BP 121/75 | HR 70 | Temp 97.5°F | Ht 66.0 in | Wt 194.8 lb

## 2020-11-05 DIAGNOSIS — Z0000 Encounter for general adult medical examination without abnormal findings: Secondary | ICD-10-CM

## 2020-11-05 DIAGNOSIS — Z794 Long term (current) use of insulin: Secondary | ICD-10-CM

## 2020-11-05 DIAGNOSIS — N183 Chronic kidney disease, stage 3 unspecified: Secondary | ICD-10-CM

## 2020-11-05 DIAGNOSIS — E1122 Type 2 diabetes mellitus with diabetic chronic kidney disease: Secondary | ICD-10-CM

## 2020-11-05 DIAGNOSIS — E1169 Type 2 diabetes mellitus with other specified complication: Secondary | ICD-10-CM | POA: Diagnosis not present

## 2020-11-05 DIAGNOSIS — I152 Hypertension secondary to endocrine disorders: Secondary | ICD-10-CM | POA: Diagnosis not present

## 2020-11-05 DIAGNOSIS — E669 Obesity, unspecified: Secondary | ICD-10-CM | POA: Diagnosis not present

## 2020-11-05 DIAGNOSIS — E119 Type 2 diabetes mellitus without complications: Secondary | ICD-10-CM | POA: Diagnosis not present

## 2020-11-05 DIAGNOSIS — E785 Hyperlipidemia, unspecified: Secondary | ICD-10-CM

## 2020-11-05 DIAGNOSIS — E1121 Type 2 diabetes mellitus with diabetic nephropathy: Secondary | ICD-10-CM

## 2020-11-05 DIAGNOSIS — E1159 Type 2 diabetes mellitus with other circulatory complications: Secondary | ICD-10-CM

## 2020-11-05 DIAGNOSIS — E1165 Type 2 diabetes mellitus with hyperglycemia: Secondary | ICD-10-CM

## 2020-11-05 LAB — CMP14+EGFR
ALT: 14 IU/L (ref 0–32)
AST: 13 IU/L (ref 0–40)
Albumin/Globulin Ratio: 1.3 (ref 1.2–2.2)
Albumin: 3.9 g/dL (ref 3.8–4.8)
Alkaline Phosphatase: 87 IU/L (ref 44–121)
BUN/Creatinine Ratio: 16 (ref 12–28)
BUN: 30 mg/dL — ABNORMAL HIGH (ref 8–27)
Bilirubin Total: 0.3 mg/dL (ref 0.0–1.2)
CO2: 20 mmol/L (ref 20–29)
Calcium: 9.4 mg/dL (ref 8.7–10.3)
Chloride: 104 mmol/L (ref 96–106)
Creatinine, Ser: 1.92 mg/dL — ABNORMAL HIGH (ref 0.57–1.00)
Globulin, Total: 3 g/dL (ref 1.5–4.5)
Glucose: 177 mg/dL — ABNORMAL HIGH (ref 65–99)
Potassium: 4.4 mmol/L (ref 3.5–5.2)
Sodium: 139 mmol/L (ref 134–144)
Total Protein: 6.9 g/dL (ref 6.0–8.5)
eGFR: 28 mL/min/{1.73_m2} — ABNORMAL LOW (ref >59)

## 2020-11-05 LAB — LIPID PANEL
Chol/HDL Ratio: 5.8 ratio — ABNORMAL HIGH (ref 0.0–4.4)
Cholesterol, Total: 180 mg/dL (ref 100–199)
HDL: 31 mg/dL — ABNORMAL LOW (ref >39)
LDL Chol Calc (NIH): 85 mg/dL (ref 0–99)
Triglycerides: 390 mg/dL — ABNORMAL HIGH (ref 0–149)
VLDL Cholesterol Cal: 64 mg/dL — ABNORMAL HIGH (ref 5–40)

## 2020-11-05 LAB — CBC WITH DIFFERENTIAL/PLATELET
Basophils Absolute: 0.1 10*3/uL (ref 0.0–0.2)
Basos: 1 %
EOS (ABSOLUTE): 0.7 10*3/uL — ABNORMAL HIGH (ref 0.0–0.4)
Eos: 9 %
Hematocrit: 39.6 % (ref 34.0–46.6)
Hemoglobin: 12.9 g/dL (ref 11.1–15.9)
Immature Grans (Abs): 0 10*3/uL (ref 0.0–0.1)
Immature Granulocytes: 0 %
Lymphocytes Absolute: 3.5 10*3/uL — ABNORMAL HIGH (ref 0.7–3.1)
Lymphs: 41 %
MCH: 26.2 pg — ABNORMAL LOW (ref 26.6–33.0)
MCHC: 32.6 g/dL (ref 31.5–35.7)
MCV: 80 fL (ref 79–97)
Monocytes Absolute: 0.6 10*3/uL (ref 0.1–0.9)
Monocytes: 7 %
Neutrophils Absolute: 3.5 10*3/uL (ref 1.4–7.0)
Neutrophils: 42 %
Platelets: 411 10*3/uL (ref 150–450)
RBC: 4.93 x10E6/uL (ref 3.77–5.28)
RDW: 13.4 % (ref 11.7–15.4)
WBC: 8.4 10*3/uL (ref 3.4–10.8)

## 2020-11-05 LAB — BAYER DCA HB A1C WAIVED: HB A1C (BAYER DCA - WAIVED): 9.9 % — ABNORMAL HIGH (ref <7.0)

## 2020-11-05 MED ORDER — KERENDIA 10 MG PO TABS
10.0000 mg | ORAL_TABLET | Freq: Every day | ORAL | 2 refills | Status: DC
Start: 2020-11-05 — End: 2021-02-11

## 2020-11-05 MED ORDER — LISINOPRIL-HYDROCHLOROTHIAZIDE 20-25 MG PO TABS: 1.0000 | ORAL_TABLET | Freq: Every day | ORAL | 1 refills | Status: DC

## 2020-11-05 MED ORDER — LOVASTATIN 40 MG PO TABS: 60.0000 mg | ORAL_TABLET | Freq: Every day | ORAL | 1 refills | Status: DC

## 2020-11-05 MED ORDER — EMPAGLIFLOZIN 25 MG PO TABS: 25.0000 mg | ORAL_TABLET | Freq: Every day | ORAL | 1 refills | Status: DC

## 2020-11-05 MED ORDER — FREESTYLE LIBRE 2 SENSOR MISC: 1.0000 | 2 refills | Status: DC

## 2020-11-05 MED ORDER — TRULICITY 4.5 MG/0.5ML ~~LOC~~ SOAJ: 4.5000 mg | SUBCUTANEOUS | 1 refills | Status: DC

## 2020-11-05 MED ORDER — AMLODIPINE BESYLATE 10 MG PO TABS: 10.0000 mg | ORAL_TABLET | Freq: Every day | ORAL | 1 refills | Status: DC

## 2020-11-05 MED ORDER — METFORMIN HCL ER 500 MG PO TB24: 500.0000 mg | ORAL_TABLET | Freq: Two times a day (BID) | ORAL | 1 refills | Status: DC

## 2020-11-05 NOTE — Patient Instructions (Signed)
Please schedule your diabetic eye exam. You may come here for retinal imaging if you prefer.   Complete your Cologuard by the end of the month before you see me again.

## 2020-11-05 NOTE — Progress Notes (Signed)
Almyra Free has done this

## 2020-11-05 NOTE — Progress Notes (Signed)
Assessment & Plan:  1. Type 2 diabetes mellitus with hyperglycemia, with long-term current use of insulin (HCC) Lab Results  Component Value Date   HGBA1C 9.9 (H) 11/05/2020   HGBA1C 11.4 (H) 07/28/2020   HGBA1C 10.1 (H) 04/14/2020    - Diabetes is not at goal of A1c < 7. - Medications: continue current medications, increase Basaglar from 16 units to 20 units once daily. - Home glucose monitoring: Freestyle libre ordered - Patient is currently taking a statin. Patient is taking an ACE-inhibitor/ARB.  - Instruction/counseling given: reminded to get eye exam, discussed the need for weight loss, and discussed diet  Diabetes Health Maintenance Due  Topic Date Due   OPHTHALMOLOGY EXAM  Never done   FOOT EXAM  04/14/2021   HEMOGLOBIN A1C  05/05/2021    Lab Results  Component Value Date   LABMICR 214.4 04/14/2020   LABMICR 38.7 03/13/2019   - encouraged to continue healthy diet and exercise - Lipid panel - CBC with Differential/Platelet - CMP14+EGFR - Bayer DCA Hb A1c Waived - Dulaglutide (TRULICITY) 4.5 WN/0.2VO SOPN; Inject 4.5 mg as directed once a week.  Dispense: 6 mL; Refill: 1 - empagliflozin (JARDIANCE) 25 MG TABS tablet; Take 1 tablet (25 mg total) by mouth daily before breakfast.  Dispense: 90 tablet; Refill: 1 - lisinopril-hydrochlorothiazide (ZESTORETIC) 20-25 MG tablet; Take 1 tablet by mouth daily.  Dispense: 90 tablet; Refill: 1 - lovastatin (MEVACOR) 40 MG tablet; Take 1.5 tablets (60 mg total) by mouth at bedtime.  Dispense: 135 tablet; Refill: 1 - metFORMIN (GLUCOPHAGE-XR) 500 MG 24 hr tablet; Take 1 tablet (500 mg total) by mouth 2 (two) times daily with a meal.  Dispense: 180 tablet; Refill: 1 - Finerenone (KERENDIA) 10 MG TABS; Take 10 mg by mouth daily.  Dispense: 30 tablet; Refill: 2 - Continuous Blood Gluc Sensor (FREESTYLE LIBRE 2 SENSOR) MISC; 1 Device by Does not apply route every 14 (fourteen) days.  Dispense: 2 each; Refill: 2  2. CKD stage 3 due to  type 2 diabetes mellitus (HCC) - added kerendia to support GFR - needs potassium level checked 4 weeks from [place] start - Finerenone (KERENDIA) 10 MG TABS; Take 10 mg by mouth daily.  Dispense: 30 tablet; Refill: 2 - Ambulatory referral to Nephrology  3. Diabetic nephropathy associated with type 2 diabetes mellitus ([place]) - encouraged to continue healthy diet and exercise - added freestyle libre - added kerendia to support GFR - Finerenone (KERENDIA) 10 MG TABS; Take 10 mg by mouth daily.  Dispense: 30 tablet; Refill: 2 - Ambulatory referral to Nephrology  4. Hyperlipidemia associated with type 2 diabetes mellitus ([place]) - encouraged to continue healthy diet and exercise - continue to take statin as prescribed - Lipid panel - lovastatin (MEVACOR) 40 MG tablet; Take 1.5 tablets (60 mg total) by mouth at bedtime.  Dispense: 135 tablet; Refill: 1  5. Hypertension associated with diabetes ([place]) - encouraged to continue healthy diet and exercise - continued to take blood pressure medications as prescribed - CBC with Differential/Platelet - CMP14+EGFR - amLODipine (NORVASC) 10 MG tablet; Take 1 tablet (10 mg total) by mouth daily.  Dispense: 90 tablet; Refill: 1 - lisinopril-hydrochlorothiazide (ZESTORETIC) 20-25 MG tablet; Take 1 tablet by mouth daily.  Dispense: 90 tablet; Refill: 1  6. Obesity (BMI 30-39.9) - encouraged to continue healthy diet and exercise - Dulaglutide (TRULICITY) 4.5 ZD/6.6YQ SOPN; Inject 4.5 mg as directed once a week.  Dispense: 6 mL; Refill: 1  7. Healthcare maintenance - will  complete cologuard - encouraged to do so before our next appointment in 4 weeks - will schedule eye exam - is considering mammogram in the future, educated about risks and benefits of screening - Printed health information provided    Return in about 2 weeks (around 11/19/2020) for with Almyra Free (DM), 4 weeks with me (DM & CKD).  [name], NP Student  I personally was present  during the history, physical exam, and medical decision-making activities of this service and have verified that the service and findings are accurately documented in the nurse practitioner student's note.  [name], MSN, APRN, FNP-C [facility]   Subjective:    Patient ID: Brooke [name], female    DOB: 1956-03-05, 65 y.o.   MRN: [id number]  Patient Care Team: [name], FNP as PCP - General (Family Medicine) Lavera Guise, [facility] (Pharmacist)   Chief Complaint:  Chief Complaint  Patient presents with   Diabetes    6 week follow up     HPI: Brooke [name] is a 65 y.o. female presenting on 11/05/2020 for Diabetes (6 week follow up )  Diabetes: Patient presents for follow up of diabetes. Current symptoms include: hyperglycemia. Known diabetic complications: nephropathy. Medication compliance: Yes. Current diet: in general, an "unhealthy" diet, but has made attempts to reduce her sugar intake . Current exercise: walking. Home blood sugar records:  180-240s . Is she  on ACE inhibitor or angiotensin II receptor blocker? Yes. Is she on a statin? Yes. She is interested in the [employer].   Health maintenance: She has cologuard at home but has not done it yet, she states she will complete it. She has not gotten an eye exam yet due to concerns about cost, but she is willing if insurance will cover exam. She is unwilling to get a mammogram at this time.    New complaints: None.   Social history:  Relevant past medical, surgical, family and social history reviewed and updated as indicated. Interim medical history since our last visit reviewed.  Allergies and medications reviewed and updated.  DATA REVIEWED: CHART IN EPIC  ROS: Negative unless specifically indicated above in HPI.    Current Outpatient Medications:    albuterol (VENTOLIN HFA) 108 (90 Base) MCG/ACT inhaler, Inhale 2 puffs into the lungs every 6 (six) hours as needed for  wheezing or shortness of breath., Disp: 18 g, Rfl: 2   aspirin EC 81 MG tablet, Take 81 mg by mouth daily. Swallow whole., Disp: , Rfl:    atenolol (TENORMIN) 100 MG tablet, TAKE 1 TABLET BY MOUTH IN THE MORNING, Disp: 90 tablet, Rfl: 0   Continuous Blood Gluc Sensor (FREESTYLE LIBRE 2 SENSOR) MISC, 1 Device by Does not apply route every 14 (fourteen) days., Disp: 2 each, Rfl: 2   Finerenone (KERENDIA) 10 MG TABS, Take 10 mg by mouth daily., Disp: 30 tablet, Rfl: 2   Insulin Glargine (BASAGLAR KWIKPEN) 100 UNIT/ML, Inject 20 Units into the skin at bedtime., Disp: , Rfl:    Insulin Pen Needle 32G X 6 MM MISC, Use to inject insulin once daily. DX:E11.65, Disp: 100 each, Rfl: 11   SYMBICORT 160-4.5 MCG/ACT inhaler, INHALE 2 PUFFS INTO THE LUNGS 2 (TWO) TIMES DAILY., Disp: 3 each, Rfl: 1   amLODipine (NORVASC) 10 MG tablet, Take 1 tablet (10 mg total) by mouth daily., Disp: 90 tablet, Rfl: 1   Dulaglutide (TRULICITY) 4.5 UL/8.4TX SOPN, Inject 4.5 mg as directed once a week., Disp: 6 mL, Rfl:  1   empagliflozin (JARDIANCE) 25 MG TABS tablet, Take 1 tablet (25 mg total) by mouth daily before breakfast., Disp: 90 tablet, Rfl: 1   lisinopril-hydrochlorothiazide (ZESTORETIC) 20-25 MG tablet, Take 1 tablet by mouth daily., Disp: 90 tablet, Rfl: 1   lovastatin (MEVACOR) 40 MG tablet, Take 1.5 tablets (60 mg total) by mouth at bedtime., Disp: 135 tablet, Rfl: 1   metFORMIN (GLUCOPHAGE-XR) 500 MG 24 hr tablet, Take 1 tablet (500 mg total) by mouth 2 (two) times daily with a meal., Disp: 180 tablet, Rfl: 1   No Known Allergies Past Medical History:  Diagnosis Date   Bronchitis    frequent bronchitis   Diabetes mellitus    Diabetic nephropathy (HCC)    Hypertension    Obesity (BMI 35.0-39.9 without comorbidity)    Vertigo     Past Surgical History:  Procedure Laterality Date   BREAST SURGERY Right 1974    Social History   Socioeconomic History   Marital status: Married    Spouse name: Donnie    Number of children: 1   Years of education: Not on file   Highest education level: Not on file  Occupational History   Not on file  Tobacco Use   Smoking status: Never   Smokeless tobacco: Never  Vaping Use   Vaping Use: Never used  Substance and Sexual Activity   Alcohol use: No   Drug use: No   Sexual activity: Never  Other Topics Concern   Not on file  Social History Narrative   Not on file   Social Determinants of Health   Financial Resource Strain: Not on file  Food Insecurity: Not on file  Transportation Needs: Not on file  Physical Activity: Not on file  Stress: Not on file  Social Connections: Not on file  Intimate Partner Violence: Not on file        Objective:    BP 121/75   Pulse 70   Temp (!) 97.5 F (36.4 C) (Temporal)   Ht '5\' 6"'  (1.676 m)   Wt 88.4 kg   LMP 03/16/2012   SpO2 98%   BMI 31.44 kg/m   Wt Readings from Last 3 Encounters:  11/05/20 194 lb 12.8 oz (88.4 kg)  07/28/20 201 lb 3.2 oz (91.3 kg)  05/16/20 198 lb (89.8 kg)    Physical Exam Vitals reviewed.  Constitutional:      General: She is not in acute distress.    Appearance: Normal appearance. She is obese. She is not ill-appearing, toxic-appearing or diaphoretic.  HENT:     Head: Normocephalic and atraumatic.  Eyes:     General: No scleral icterus.       Right eye: No discharge.        Left eye: No discharge.     Conjunctiva/sclera: Conjunctivae normal.  Cardiovascular:     Rate and Rhythm: Normal rate and regular rhythm.     Heart sounds: Normal heart sounds. No murmur heard.   No friction rub. No gallop.  Pulmonary:     Effort: Pulmonary effort is normal. No respiratory distress.     Breath sounds: Normal breath sounds. No stridor. No wheezing, rhonchi or rales.  Musculoskeletal:        General: Normal range of motion.     Cervical back: Normal range of motion.     Thoracic back: Tenderness: muscular.  Skin:    General: Skin is warm and dry.     Capillary Refill:  Capillary refill takes less  than 2 seconds.  Neurological:     General: No focal deficit present.     Mental Status: She is alert and oriented to person, place, and time. Mental status is at baseline.  Psychiatric:        Mood and Affect: Mood normal.        Behavior: Behavior normal.        Thought Content: Thought content normal.        Judgment: Judgment normal.    Lab Results  Component Value Date   TSH 1.880 03/13/2019   Lab Results  Component Value Date   WBC 8.8 07/28/2020   HGB 12.2 07/28/2020   HCT 38.4 07/28/2020   MCV 83 07/28/2020   PLT 398 07/28/2020   Lab Results  Component Value Date   NA 139 07/28/2020   K 5.0 07/28/2020   CO2 22 07/28/2020   GLUCOSE 148 (H) 07/28/2020   BUN 20 07/28/2020   CREATININE 1.57 (H) 07/28/2020   BILITOT 0.2 07/28/2020   ALKPHOS 90 07/28/2020   AST 22 07/28/2020   ALT 24 07/28/2020   PROT 7.1 07/28/2020   ALBUMIN 4.2 07/28/2020   CALCIUM 9.4 07/28/2020   ANIONGAP 11 05/16/2020   EGFR 36 (L) 07/28/2020   Lab Results  Component Value Date   CHOL 176 07/28/2020   Lab Results  Component Value Date   HDL 39 (L) 07/28/2020   Lab Results  Component Value Date   LDLCALC 83 07/28/2020   Lab Results  Component Value Date   TRIG 328 (H) 07/28/2020   Lab Results  Component Value Date   CHOLHDL 4.5 (H) 07/28/2020   Lab Results  Component Value Date   HGBA1C 11.4 (H) 07/28/2020

## 2020-11-10 ENCOUNTER — Telehealth: Payer: Self-pay | Admitting: Family Medicine

## 2020-11-10 NOTE — Telephone Encounter (Signed)
Patient does not need to use insulin for Medicare to cover. It just needs to be ordered through [employer].

## 2020-11-11 NOTE — Telephone Encounter (Signed)
I tried to submit via [employer] Will try to get another company to approve but it is likely not going to be covered :(

## 2020-11-11 NOTE — Telephone Encounter (Signed)
Not sure how to use that website yet Almyra Free can you help?

## 2020-11-12 NOTE — Telephone Encounter (Signed)
Please make patient aware

## 2020-11-16 ENCOUNTER — Telehealth: Payer: Self-pay | Admitting: *Deleted

## 2020-11-16 DIAGNOSIS — Z794 Long term (current) use of insulin: Secondary | ICD-10-CM

## 2020-11-16 DIAGNOSIS — E1165 Type 2 diabetes mellitus with hyperglycemia: Secondary | ICD-10-CM

## 2020-11-16 NOTE — Telephone Encounter (Signed)
FreeStyle [place] 2 Sensor Key: [employer]  Sent to Plan today

## 2020-11-20 ENCOUNTER — Telehealth: Payer: Medicare HMO

## 2020-11-22 DIAGNOSIS — Z1211 Encounter for screening for malignant neoplasm of colon: Secondary | ICD-10-CM | POA: Diagnosis not present

## 2020-11-25 ENCOUNTER — Telehealth: Payer: Medicare HMO

## 2020-11-25 DIAGNOSIS — Z7189 Other specified counseling: Secondary | ICD-10-CM | POA: Diagnosis not present

## 2020-11-25 DIAGNOSIS — I129 Hypertensive chronic kidney disease with stage 1 through stage 4 chronic kidney disease, or unspecified chronic kidney disease: Secondary | ICD-10-CM | POA: Diagnosis not present

## 2020-11-25 DIAGNOSIS — E1129 Type 2 diabetes mellitus with other diabetic kidney complication: Secondary | ICD-10-CM | POA: Diagnosis not present

## 2020-11-25 DIAGNOSIS — E6609 Other obesity due to excess calories: Secondary | ICD-10-CM | POA: Diagnosis not present

## 2020-11-25 DIAGNOSIS — E1122 Type 2 diabetes mellitus with diabetic chronic kidney disease: Secondary | ICD-10-CM | POA: Diagnosis not present

## 2020-11-25 DIAGNOSIS — N170 Acute kidney failure with tubular necrosis: Secondary | ICD-10-CM | POA: Diagnosis not present

## 2020-11-25 DIAGNOSIS — Z79899 Other long term (current) drug therapy: Secondary | ICD-10-CM | POA: Diagnosis not present

## 2020-11-25 DIAGNOSIS — N189 Chronic kidney disease, unspecified: Secondary | ICD-10-CM | POA: Diagnosis not present

## 2020-11-25 DIAGNOSIS — R809 Proteinuria, unspecified: Secondary | ICD-10-CM | POA: Diagnosis not present

## 2020-11-26 ENCOUNTER — Other Ambulatory Visit (HOSPITAL_COMMUNITY): Payer: Self-pay | Admitting: Nephrology

## 2020-11-26 ENCOUNTER — Telehealth: Payer: Self-pay

## 2020-11-26 DIAGNOSIS — Z794 Long term (current) use of insulin: Secondary | ICD-10-CM

## 2020-11-26 DIAGNOSIS — E1122 Type 2 diabetes mellitus with diabetic chronic kidney disease: Secondary | ICD-10-CM

## 2020-11-26 DIAGNOSIS — E669 Obesity, unspecified: Secondary | ICD-10-CM

## 2020-11-26 DIAGNOSIS — E1165 Type 2 diabetes mellitus with hyperglycemia: Secondary | ICD-10-CM

## 2020-11-26 NOTE — Telephone Encounter (Signed)
Received a call from [facility] with Dr. Ellin Saba office.  Wanted Brooke [name] to be aware that he lowered her lisinopril and also he was thinking about stopping her metformin due to her kidney function.  Patient has apt with julie next week.  Will leave it up to her if she should stop her metformin.

## 2020-11-27 DIAGNOSIS — E1122 Type 2 diabetes mellitus with diabetic chronic kidney disease: Secondary | ICD-10-CM | POA: Diagnosis not present

## 2020-11-27 DIAGNOSIS — Z79899 Other long term (current) drug therapy: Secondary | ICD-10-CM | POA: Diagnosis not present

## 2020-11-27 DIAGNOSIS — N189 Chronic kidney disease, unspecified: Secondary | ICD-10-CM | POA: Diagnosis not present

## 2020-11-27 DIAGNOSIS — E559 Vitamin D deficiency, unspecified: Secondary | ICD-10-CM | POA: Insufficient documentation

## 2020-11-27 DIAGNOSIS — E790 Hyperuricemia without signs of inflammatory arthritis and tophaceous disease: Secondary | ICD-10-CM

## 2020-11-27 DIAGNOSIS — R809 Proteinuria, unspecified: Secondary | ICD-10-CM | POA: Diagnosis not present

## 2020-11-27 DIAGNOSIS — E6609 Other obesity due to excess calories: Secondary | ICD-10-CM | POA: Diagnosis not present

## 2020-11-27 DIAGNOSIS — Z7189 Other specified counseling: Secondary | ICD-10-CM | POA: Diagnosis not present

## 2020-11-27 DIAGNOSIS — E1129 Type 2 diabetes mellitus with other diabetic kidney complication: Secondary | ICD-10-CM | POA: Diagnosis not present

## 2020-11-27 DIAGNOSIS — I129 Hypertensive chronic kidney disease with stage 1 through stage 4 chronic kidney disease, or unspecified chronic kidney disease: Secondary | ICD-10-CM | POA: Diagnosis not present

## 2020-11-27 HISTORY — DX: Vitamin D deficiency, unspecified: E55.9

## 2020-11-27 HISTORY — DX: Hyperuricemia without signs of inflammatory arthritis and tophaceous disease: E79.0

## 2020-11-27 LAB — COLOGUARD: COLOGUARD: POSITIVE — AB

## 2020-11-27 MED ORDER — TRULICITY 4.5 MG/0.5ML ~~LOC~~ SOAJ: 4.5000 mg | SUBCUTANEOUS | 1 refills | Status: DC

## 2020-11-27 NOTE — Telephone Encounter (Signed)
Denied on September 12 No notification sent/No se envi notificacin

## 2020-11-27 NOTE — Telephone Encounter (Signed)
Called patient  Stop metformin now Will f/u Bgs on 9/27 Continue all other meds as prescribed

## 2020-11-27 NOTE — Addendum Note (Signed)
Addended by: Lottie Dawson D on: 11/27/2020 08:41 AM   Modules accepted: Orders

## 2020-11-30 LAB — COLOGUARD: Cologuard: POSITIVE — AB

## 2020-12-01 ENCOUNTER — Telehealth: Payer: Medicare HMO

## 2020-12-01 ENCOUNTER — Telehealth: Payer: Self-pay | Admitting: Pharmacist

## 2020-12-01 NOTE — Telephone Encounter (Signed)
Left VM no answer Requested patient talk to PCP or nurse if urgent I may have to return here call tomorrow

## 2020-12-03 ENCOUNTER — Ambulatory Visit (HOSPITAL_COMMUNITY)
Admission: RE | Admit: 2020-12-03 | Discharge: 2020-12-03 | Disposition: A | Discharge status: Home / Self Care | Payer: Medicare HMO | Source: Ambulatory Visit | Attending: Nephrology | Admitting: Nephrology

## 2020-12-03 ENCOUNTER — Other Ambulatory Visit: Payer: Self-pay

## 2020-12-03 DIAGNOSIS — E6609 Other obesity due to excess calories: Secondary | ICD-10-CM | POA: Diagnosis not present

## 2020-12-03 DIAGNOSIS — E1129 Type 2 diabetes mellitus with other diabetic kidney complication: Secondary | ICD-10-CM | POA: Insufficient documentation

## 2020-12-03 DIAGNOSIS — I129 Hypertensive chronic kidney disease with stage 1 through stage 4 chronic kidney disease, or unspecified chronic kidney disease: Secondary | ICD-10-CM | POA: Diagnosis not present

## 2020-12-03 DIAGNOSIS — N2889 Other specified disorders of kidney and ureter: Secondary | ICD-10-CM | POA: Diagnosis not present

## 2020-12-03 DIAGNOSIS — K760 Fatty (change of) liver, not elsewhere classified: Secondary | ICD-10-CM | POA: Diagnosis not present

## 2020-12-03 DIAGNOSIS — R809 Proteinuria, unspecified: Secondary | ICD-10-CM | POA: Insufficient documentation

## 2020-12-03 DIAGNOSIS — N189 Chronic kidney disease, unspecified: Secondary | ICD-10-CM | POA: Insufficient documentation

## 2020-12-03 DIAGNOSIS — E1122 Type 2 diabetes mellitus with diabetic chronic kidney disease: Secondary | ICD-10-CM | POA: Diagnosis not present

## 2020-12-03 DIAGNOSIS — Z79899 Other long term (current) drug therapy: Secondary | ICD-10-CM | POA: Insufficient documentation

## 2020-12-10 ENCOUNTER — Other Ambulatory Visit: Payer: Self-pay

## 2020-12-10 ENCOUNTER — Encounter: Payer: Self-pay | Admitting: Family Medicine

## 2020-12-10 ENCOUNTER — Ambulatory Visit (INDEPENDENT_AMBULATORY_CARE_PROVIDER_SITE_OTHER): Payer: Medicare HMO | Admitting: Family Medicine

## 2020-12-10 VITALS — BP 121/76 | HR 76 | Temp 97.4°F | Ht 66.0 in | Wt 197.6 lb

## 2020-12-10 DIAGNOSIS — N183 Chronic kidney disease, stage 3 unspecified: Secondary | ICD-10-CM

## 2020-12-10 DIAGNOSIS — Z23 Encounter for immunization: Secondary | ICD-10-CM | POA: Diagnosis not present

## 2020-12-10 DIAGNOSIS — Z794 Long term (current) use of insulin: Secondary | ICD-10-CM

## 2020-12-10 DIAGNOSIS — I152 Hypertension secondary to endocrine disorders: Secondary | ICD-10-CM | POA: Diagnosis not present

## 2020-12-10 DIAGNOSIS — R195 Other fecal abnormalities: Secondary | ICD-10-CM | POA: Diagnosis not present

## 2020-12-10 DIAGNOSIS — E1159 Type 2 diabetes mellitus with other circulatory complications: Secondary | ICD-10-CM | POA: Diagnosis not present

## 2020-12-10 DIAGNOSIS — E1122 Type 2 diabetes mellitus with diabetic chronic kidney disease: Secondary | ICD-10-CM

## 2020-12-10 DIAGNOSIS — E1165 Type 2 diabetes mellitus with hyperglycemia: Secondary | ICD-10-CM

## 2020-12-10 MED ORDER — HYDROCHLOROTHIAZIDE 25 MG PO TABS: 25.0000 mg | ORAL_TABLET | Freq: Every day | ORAL | 2 refills | Status: DC

## 2020-12-10 MED ORDER — LISINOPRIL 10 MG PO TABS: 10.0000 mg | ORAL_TABLET | Freq: Every day | ORAL | 2 refills | Status: DC

## 2020-12-10 NOTE — Progress Notes (Signed)
Assessment & Plan:  1. Type 2 diabetes mellitus with hyperglycemia, with long-term current use of insulin (HCC) - uncontrolled, increase basaglar from 20 units to 25 units daily  2. Hypertension associated with diabetes ([place]) - reducing lisinopril to 10 mg from 20 mg  - combination pill separated so that she can continue HCTZ 25 mg daily - advised to take BP at home and keep a log with reduced dose of lisinopril - lisinopril (ZESTRIL) 10 MG tablet; Take 1 tablet (10 mg total) by mouth daily.  Dispense: 30 tablet; Refill: 2 - hydrochlorothiazide (HYDRODIURIL) 25 MG tablet; Take 1 tablet (25 mg total) by mouth daily.  Dispense: 30 tablet; Refill: 2 - BMP8+EGFR  2. CKD stage 3 due to type 2 diabetes mellitus (HCC) - BMP8+EGFR - followed by nephrology  4. Need for immunization against influenza - Flu Vaccine QUAD High Dose(Fluad) - given in office  5. Positive colorectal cancer screening using Cologuard test - patient understands risks, but is not agreeable to a colonoscopy at this time. States she has too much going on, but that she will continue to think and pray about it and may do it in the future.   Return in about 2 months (around 02/09/2021) for follow-up of chronic medication conditions.  [name], NP Student  I personally was present during the history, physical exam, and medical decision-making activities of this service and have verified that the service and findings are accurately documented in the nurse practitioner student's note.  [name], MSN, APRN, FNP-C [facility]   Subjective:    Patient ID: Brooke [name], female    DOB: 12/12/1955, 65 y.o.   MRN: [id number]  Patient Care Team: [name], FNP as PCP - General (Family Medicine) Lavera Guise, [facility] (Pharmacist)   Chief Complaint:  Chief Complaint  Patient presents with   Diabetes   Chronic Kidney Disease    4 week follow up     HPI: Brooke [name] is a 65  y.o. female presenting on 12/10/2020 for Diabetes and Chronic Kidney Disease (4 week follow up )  Diabetes: Patient presents for follow up of diabetes. Current symptoms include: hyperglycemia. Known diabetic complications: nephropathy. Medication compliance: Yes, she is on Jardiance 25 mg, Trulicity 4.5 mg, and Basaglar 20 units QHS. Current diet: in general, an "unhealthy" diet, but has made attempts to reduce her sugar intake . Current exercise: walking. Home blood sugar records:  140s-180s before breakfast . Is she  on ACE inhibitor or angiotensin II receptor blocker? Yes. Is she on a statin? Yes. She was stopped on Metformin due to her kidney function.   Hypertension: She has been well controlled on lisinopril 40m-HCTZ 25 mg. She does not take her BP at home but she has a cuff available.   CKD: She was started on Kerendia at her last visit. Her nephrologist recommended decreasing lisinopril to 10 mg due to decreased kidney function.    New complaints: None.   Social history:  Relevant past medical, surgical, family and social history reviewed and updated as indicated. Interim medical history since our last visit reviewed.  Allergies and medications reviewed and updated.  DATA REVIEWED: CHART IN EPIC  ROS: Negative unless specifically indicated above in HPI.    Current Outpatient Medications:    albuterol (VENTOLIN HFA) 108 (90 Base) MCG/ACT inhaler, Inhale 2 puffs into the lungs every 6 (six) hours as needed for wheezing or shortness of breath., Disp: 18 g, Rfl: 2  amLODipine (NORVASC) 10 MG tablet, Take 1 tablet (10 mg total) by mouth daily., Disp: 90 tablet, Rfl: 1   atenolol (TENORMIN) 100 MG tablet, TAKE 1 TABLET BY MOUTH IN THE MORNING, Disp: 90 tablet, Rfl: 0   Dulaglutide (TRULICITY) 4.5 FI/4.3PI SOPN, Inject 4.5 mg as directed once a week., Disp: 6 mL, Rfl: 1   empagliflozin (JARDIANCE) 25 MG TABS tablet, Take 1 tablet (25 mg total) by mouth daily before breakfast., Disp:  90 tablet, Rfl: 1   Finerenone (KERENDIA) 10 MG TABS, Take 10 mg by mouth daily., Disp: 30 tablet, Rfl: 2   hydrochlorothiazide (HYDRODIURIL) 25 MG tablet, Take 1 tablet (25 mg total) by mouth daily., Disp: 30 tablet, Rfl: 2   Insulin Glargine (BASAGLAR KWIKPEN) 100 UNIT/ML, Inject 25 Units into the skin at bedtime., Disp: , Rfl:    Insulin Pen Needle 32G X 6 MM MISC, Use to inject insulin once daily. DX:E11.65, Disp: 100 each, Rfl: 11   lisinopril (ZESTRIL) 10 MG tablet, Take 1 tablet (10 mg total) by mouth daily., Disp: 30 tablet, Rfl: 2   lovastatin (MEVACOR) 40 MG tablet, Take 1.5 tablets (60 mg total) by mouth at bedtime., Disp: 135 tablet, Rfl: 1   SYMBICORT 160-4.5 MCG/ACT inhaler, INHALE 2 PUFFS INTO THE LUNGS 2 (TWO) TIMES DAILY., Disp: 3 each, Rfl: 1   No Known Allergies Past Medical History:  Diagnosis Date   Bronchitis    frequent bronchitis   Diabetes mellitus    Diabetic nephropathy (HCC)    Hypertension    Obesity (BMI 35.0-39.9 without comorbidity)    Vertigo     Past Surgical History:  Procedure Laterality Date   BREAST SURGERY Right 1974    Social History   Socioeconomic History   Marital status: Married    Spouse name: Donnie   Number of children: 1   Years of education: Not on file   Highest education level: Not on file  Occupational History   Not on file  Tobacco Use   Smoking status: Never   Smokeless tobacco: Never  Vaping Use   Vaping Use: Never used  Substance and Sexual Activity   Alcohol use: No   Drug use: No   Sexual activity: Never  Other Topics Concern   Not on file  Social History Narrative   Not on file   Social Determinants of Health   Financial Resource Strain: Not on file  Food Insecurity: Not on file  Transportation Needs: Not on file  Physical Activity: Not on file  Stress: Not on file  Social Connections: Not on file  Intimate Partner Violence: Not on file        Objective:    BP 121/76   Pulse 76   Temp (!) 97.4  F (36.3 C) (Temporal)   Ht '5\' 6"'  (1.676 m)   Wt 89.6 kg   LMP 03/16/2012   SpO2 98%   BMI 31.89 kg/m   Wt Readings from Last 3 Encounters:  12/10/20 197 lb 9.6 oz (89.6 kg)  11/05/20 194 lb 12.8 oz (88.4 kg)  07/28/20 201 lb 3.2 oz (91.3 kg)    Physical Exam Vitals reviewed.  Constitutional:      General: She is not in acute distress.    Appearance: Normal appearance. She is obese. She is not ill-appearing, toxic-appearing or diaphoretic.  HENT:     Head: Normocephalic and atraumatic.  Eyes:     General: No scleral icterus.       Right eye:  No discharge.        Left eye: No discharge.     Conjunctiva/sclera: Conjunctivae normal.  Cardiovascular:     Rate and Rhythm: Normal rate and regular rhythm.     Heart sounds: Normal heart sounds. No murmur heard.   No friction rub. No gallop.  Pulmonary:     Effort: Pulmonary effort is normal. No respiratory distress.     Breath sounds: Normal breath sounds. No stridor. No wheezing, rhonchi or rales.  Musculoskeletal:        General: Normal range of motion.     Cervical back: Normal range of motion.     Thoracic back: Tenderness: muscular.  Skin:    General: Skin is warm and dry.     Capillary Refill: Capillary refill takes less than 2 seconds.  Neurological:     General: No focal deficit present.     Mental Status: She is alert and oriented to person, place, and time. Mental status is at baseline.  Psychiatric:        Mood and Affect: Mood normal.        Behavior: Behavior normal.        Thought Content: Thought content normal.        Judgment: Judgment normal.    Lab Results  Component Value Date   TSH 1.880 03/13/2019   Lab Results  Component Value Date   WBC 8.4 11/05/2020   HGB 12.9 11/05/2020   HCT 39.6 11/05/2020   MCV 80 11/05/2020   PLT 411 11/05/2020   Lab Results  Component Value Date   NA 139 11/05/2020   K 4.4 11/05/2020   CO2 20 11/05/2020   GLUCOSE 177 (H) 11/05/2020   BUN 30 (H) 11/05/2020    CREATININE 1.92 (H) 11/05/2020   BILITOT 0.3 11/05/2020   ALKPHOS 87 11/05/2020   AST 13 11/05/2020   ALT 14 11/05/2020   PROT 6.9 11/05/2020   ALBUMIN 3.9 11/05/2020   CALCIUM 9.4 11/05/2020   ANIONGAP 11 05/16/2020   EGFR 28 (L) 11/05/2020   Lab Results  Component Value Date   CHOL 180 11/05/2020   Lab Results  Component Value Date   HDL 31 (L) 11/05/2020   Lab Results  Component Value Date   LDLCALC 85 11/05/2020   Lab Results  Component Value Date   TRIG 390 (H) 11/05/2020   Lab Results  Component Value Date   CHOLHDL 5.8 (H) 11/05/2020   Lab Results  Component Value Date   HGBA1C 9.9 (H) 11/05/2020

## 2020-12-11 LAB — BMP8+EGFR
BUN/Creatinine Ratio: 16 (ref 12–28)
BUN: 30 mg/dL — ABNORMAL HIGH (ref 8–27)
CO2: 22 mmol/L (ref 20–29)
Calcium: 9.3 mg/dL (ref 8.7–10.3)
Chloride: 102 mmol/L (ref 96–106)
Creatinine, Ser: 1.92 mg/dL — ABNORMAL HIGH (ref 0.57–1.00)
Glucose: 176 mg/dL — ABNORMAL HIGH (ref 70–99)
Potassium: 4.5 mmol/L (ref 3.5–5.2)
Sodium: 138 mmol/L (ref 134–144)
eGFR: 28 mL/min/{1.73_m2} — ABNORMAL LOW (ref >59)

## 2020-12-31 ENCOUNTER — Ambulatory Visit (INDEPENDENT_AMBULATORY_CARE_PROVIDER_SITE_OTHER): Payer: Medicare HMO | Admitting: Pharmacist

## 2020-12-31 DIAGNOSIS — E1165 Type 2 diabetes mellitus with hyperglycemia: Secondary | ICD-10-CM

## 2020-12-31 DIAGNOSIS — E1122 Type 2 diabetes mellitus with diabetic chronic kidney disease: Secondary | ICD-10-CM

## 2020-12-31 DIAGNOSIS — Z794 Long term (current) use of insulin: Secondary | ICD-10-CM

## 2020-12-31 NOTE — Patient Instructions (Signed)
Visit Information  PATIENT GOALS:  Goals Addressed               This Visit's Progress     Patient Stated     T2DM (pt-stated)        Current Barriers:  Unable to independently afford treatment regimen Unable to achieve control of T2DM   Pharmacist Clinical Goal(s):  Over the next 90 days, patient will verbalize ability to afford treatment regimen maintain control of T2DM as evidenced by GOAL A1C<7%  through collaboration with PharmD and provider.    Interventions: 1:1 collaboration with [name], FNP regarding development and update of comprehensive plan of care as evidenced by provider attestation and co-signature Inter-disciplinary care team collaboration (see longitudinal plan of care) Comprehensive medication review performed; medication list updated in electronic medical record  Diabetes: Uncontrolled a1c 9.9% (has improved); current treatment: BASAGLAR TO 25 UNITS, TRULICITY 4.5MG , JARDIANCE 25mg  daily;  Metformin was discontinued last month due to GFR 28-->new start Kerendia (pt sees nephro next week, repeat labs were within normal limits) PATIENT REPORTS SHE FEELS BETTER HER FBG IS FINALLY <200 SHE IS WORKING ON CONTROLLING HER DIABETES--dietary changes have been made Denies personal and family history of Medullary thyroid cancer (MTC) Current glucose readings: fasting glucose: <200, post prandial glucose: having higher post prandials in PM (late night snacking--working on) Denies hypoglycemic/hyperglycemic symptoms Discussed meal planning options and Plate method for healthy eating Avoid sugary drinks and desserts Incorporate balanced protein, non starchy veggies, 1 serving of carbohydrate with each meal Increase water intake Increase physical activity as able Current exercise: N/A Educated on DIABETES MEDICATIONS, BG READINGS Assessed patient finances. PATIENT USING HEALTH DEPARTMENT FOR MEDICATIONS, INSTRUCTED PATIENT ON HOW TO CALL IN REFILLS, I WILL  REACH OUT AT A LATER DATE TO MAKE SURE REFILLS ARE RECEIVED DUE TO HEALTH DEPT CHANGE IN STAFF;  patient is enrolled in [employer] patient assistance for basaglar/trulicity   Patient Goals/Self-Care Activities Over the next 90 days, patient will:  - take medications as prescribed check glucose DAILY, document, and provide at future appointments  Follow Up Plan: Telephone follow up appointment with care management team member scheduled for: 3 months         The patient verbalized understanding of instructions, educational materials, and care plan provided today and declined offer to receive copy of patient instructions, educational materials, and care plan.   The patient will call when medication issues arise* as advised to PharmD.    Regina Eck, PharmD, BCPS Clinical Pharmacist, [place]  II Phone [phone]

## 2020-12-31 NOTE — Progress Notes (Signed)
Chronic Care Management Pharmacy Note  12/31/2020 Name:  Brooke [name] MRN:  [id number] DOB:  1955-04-07  Summary: T2DM  Recommendations/Changes made from today's visit: Diabetes: Uncontrolled a1c 9.9% (has improved); current treatment: BASAGLAR TO 25 UNITS, TRULICITY 4.2VZ, JARDIANCE 30m daily;  Metformin was discontinued last month due to GFR 28-->new start Kerendia (pt sees nephro next week, repeat labs were within normal limits) PATIENT REPORTS SHE FEELS BETTER HER FBG IS MOSTLY <200 SHE IS WORKING ON CONTROLLING HER DIABETES--dietary changes have been made Denies personal and family history of Medullary thyroid cancer (MTC) Current glucose readings: fasting glucose: <200, post prandial glucose: having higher post prandials in PM (late night snacking--working on) Denies hypoglycemic/hyperglycemic symptoms Discussed meal planning options and Plate method for healthy eating Avoid sugary drinks and desserts Incorporate balanced protein, non starchy veggies, 1 serving of carbohydrate with each meal Increase water intake Increase physical activity as able Current exercise: N/A Educated on DIABETES MEDICATIONS, BG READINGS Assessed patient finances. PATIENT USING HEALTH DEPARTMENT FOR MEDICATIONS, INSTRUCTED PATIENT ON HOW TO CALL IN REFILLS, I WILL REACH OUT AT A LATER DATE TO MAKE SURE REFILLS ARE RECEIVED DUE TO HEALTH DEPT CHANGE IN STAFF;  patient is enrolled in [facility] patient assistance for basaglar/trulicity  Follow Up Plan: Telephone follow up appointment with care management team member scheduled for: 3 months  Subjective: Brooke [name] an 65y.o. year old female who is a primary patient of JLoman Brooklyn FNP.  The CCM team was consulted for assistance with disease management and care coordination needs.    Engaged with patient by telephone for follow up visit in response to provider referral for pharmacy case management and/or care coordination services.    Consent to Services:  The patient was given information about Chronic Care Management services, agreed to services, and gave verbal consent prior to initiation of services.  Please see initial visit note for detailed documentation.   Patient Care Team: JLoman Brooklyn FNP as PCP - General (Family Medicine) [facility]vera [facility]ise [facility](Ph[facility]ist)   Objective:  Lab Results  Component Value Date   CREATININE 1.92 (H) 12/10/2020   CREATININE 1.92 (H) 11/05/2020   CREATININE 1.57 (H) 07/28/2020    Lab Results  Component Value Date   HGBA1C 9.9 (H) 11/05/2020   Last diabetic Eye exam: No results found for: HMDIABEYEEXA  Last diabetic Foot exam: No results found for: HMDIABFOOTEX      Component Value Date/Time   CHOL 180 11/05/2020 0839   TRIG 390 (H) 11/05/2020 0839   HDL 31 (L) 11/05/2020 0839   CHOLHDL 5.8 (H) 11/05/2020 0839   LDLCALC 85 11/05/2020 0839    Hepatic Function Latest Ref Rng & Units 11/05/2020 07/28/2020 05/16/2020  Total Protein 6.0 - 8.5 g/dL 6.9 7.1 7.5  Albumin 3.8 - 4.8 g/dL 3.9 4.2 3.6  AST 0 - 40 IU/L '13 22 21  ' ALT 0 - 32 IU/L '14 24 21  ' Alk Phosphatase 44 - 121 IU/L 87 90 69  Total Bilirubin 0.0 - 1.2 mg/dL 0.3 0.2 0.6  Bilirubin, Direct 0.0 - 0.2 mg/dL - - -    Lab Results  Component Value Date/Time   TSH 1.880 03/13/2019 09:55 AM    CBC Latest Ref Rng & Units 11/05/2020 07/28/2020 05/16/2020  WBC 3.4 - 10.8 x10E3/uL 8.4 8.8 15.2(H)  Hemoglobin 11.1 - 15.9 g/dL 12.9 12.2 13.4  Hematocrit 34.0 - 46.6 % 39.6 38.4 43.6  Platelets 150 - 450 x10E3/uL 411 398 400  No results found for: VD25OH  Clinical ASCVD: No  The 10-year ASCVD risk score (Arnett DK, et al., 2019) is: 19.2%   Values used to calculate the score:     Age: 65 years     Sex: Female     Is Non-Hispanic African American: Yes     Diabetic: Yes     Tobacco smoker: No     Systolic Blood Pressure: 004 mmHg     Is BP treated: Yes     HDL Cholesterol: 31 mg/dL     Total Cholesterol:  180 mg/dL    Other: (CHADS2VASc if Afib, PHQ9 if depression, MMRC or CAT for COPD, ACT, DEXA)  Social History   Tobacco Use  Smoking Status Never  Smokeless Tobacco Never   BP Readings from Last 3 Encounters:  12/10/20 121/76  11/05/20 121/75  07/28/20 (!) 141/79   Pulse Readings from Last 3 Encounters:  12/10/20 76  11/05/20 70  07/28/20 73   Wt Readings from Last 3 Encounters:  12/10/20 197 lb 9.6 oz (89.6 kg)  11/05/20 194 lb 12.8 oz (88.4 kg)  07/28/20 201 lb 3.2 oz (91.3 kg)    Assessment: Review of patient past medical history, allergies, medications, health status, including review of consultants reports, laboratory and other test data, was performed as part of comprehensive evaluation and provision of chronic care management services.   SDOH:  (Social Determinants of Health) assessments and interventions performed:    CCM Care Plan  No Known Allergies  Medications Reviewed Today     Reviewed by [name], FNP (Family Nurse Practitioner) on 12/10/20 at 23  Med List Status: <None>   Medication Order Taking? Sig Documenting Provider Last Dose Status Informant  albuterol (VENTOLIN HFA) 108 (90 Base) MCG/ACT inhaler [id number] Yes Inhale 2 puffs into the lungs every 6 (six) hours as needed for wheezing or shortness of breath. [name], FNP Taking Active Self  amLODipine (NORVASC) 10 MG tablet [id number] Yes Take 1 tablet (10 mg total) by mouth daily. [name], FNP Taking Active   atenolol (TENORMIN) 100 MG tablet [id number] Yes TAKE 1 TABLET BY MOUTH IN THE MORNING [name], FNP Taking Active   Dulaglutide (TRULICITY) 4.5 HF/2.9MS SOPN [id number] Yes Inject 4.5 mg as directed once a week. [name], FNP Taking Active   empagliflozin (JARDIANCE) 25 MG TABS tablet [id number] Yes Take 1 tablet (25 mg total) by mouth daily before breakfast. [name], FNP Taking Active   Finerenone [facility]) 10 MG TABS [id number] Yes Take 10 mg by  mouth daily. [name], FNP Taking Active   hydrochlorothiazide (HYDRODIURIL) 25 MG tablet [id number] Yes Take 1 tablet (25 mg total) by mouth daily. [name] F, FNP  Active   Insulin Glargine [facility]) 100 UNIT/ML [id number] Yes Inject 25 Units into the skin at bedtime. [provider] Taking Active   Insulin Pen Needle 32G X 6 MM MISC [id number] Yes Use to inject insulin once daily. DX:E11.65 [name], FNP Taking Active   lisinopril (ZESTRIL) 10 MG tablet [id number] Yes Take 1 tablet (10 mg total) by mouth daily. [name], FNP  Active   lovastatin (MEVACOR) 40 MG tablet [id number] Yes Take 1.5 tablets (60 mg total) by mouth at bedtime. [name], FNP Taking Active   SYMBICORT 160-4.5 MCG/ACT inhaler [id number] Yes INHALE 2 PUFFS INTO THE LUNGS 2 (TWO) TIMES DAILY. [name], FNP Taking Active  Patient Active Problem List   Diagnosis Date Noted   Positive colorectal cancer screening using Cologuard test 12/10/2020   Diabetic nephropathy ([place])    CKD stage 3 due to type 2 diabetes mellitus ([place]) 03/14/2019   Type 2 diabetes mellitus with hyperglycemia, with long-term current use of insulin ([place]) 03/13/2019   Hyperlipidemia associated with type 2 diabetes mellitus ([place]) 03/13/2019   Hypertension associated with diabetes ([place]) 03/13/2019   Obesity (BMI 30-39.9) 03/13/2019   Chronic bronchitis ([place]) 03/13/2019    Immunization History  Administered Date(s) Administered   Fluad Quad(high Dose 65+) 03/13/2019, 11/28/2019, 12/10/2020   PFIZER(Purple Top)SARS-COV-2 Vaccination 04/12/2019, 05/03/2019, 01/14/2020    Conditions to be addressed/monitored: DMII  Care Plan : PHARMD MEDICATION MANAGEMENT  Updates made by Lavera Guise, [place] since 12/31/2020 12:00 AM     Problem: DISEASE PROGRESSION PREVENTION      Long-Range Goal: T2DM   Recent Progress: Not on track  Priority: High  Note:   Current Barriers:  Unable  to independently afford treatment regimen Unable to achieve control of T2DM   Pharmacist Clinical Goal(s):  Over the next 90 days, patient will verbalize ability to afford treatment regimen maintain control of T2DM as evidenced by GOAL A1C<7%  through collaboration with PharmD and provider.    Interventions: 1:1 collaboration with [name], FNP regarding development and update of comprehensive plan of care as evidenced by provider attestation and co-signature Inter-disciplinary care team collaboration (see longitudinal plan of care) Comprehensive medication review performed; medication list updated in electronic medical record  Diabetes: Uncontrolled a1c 9.9% (has improved); current treatment: BASAGLAR TO 25 UNITS, TRULICITY 7.3SK, JARDIANCE 72m daily;  Metformin was discontinued last month due to GFR 28-->new start Kerendia (pt sees nephro next week, repeat labs were within normal limits) PATIENT REPORTS SHE FEELS BETTER HER FBG IS FINALLY <200 SHE IS WORKING ON CONTROLLING HER DIABETES--dietary changes have been made Denies personal and family history of Medullary thyroid cancer (MTC) Current glucose readings: fasting glucose: <200, post prandial glucose: having higher post prandials in PM (late night snacking--working on) Denies hypoglycemic/hyperglycemic symptoms Discussed meal planning options and Plate method for healthy eating Avoid sugary drinks and desserts Incorporate balanced protein, non starchy veggies, 1 serving of carbohydrate with each meal Increase water intake Increase physical activity as able Current exercise: N/A Educated on DIABETES MEDICATIONS, BG READINGS Assessed patient finances. PATIENT USING HEALTH DEPARTMENT FOR MEDICATIONS, INSTRUCTED PATIENT ON HOW TO CALL IN REFILLS, I WILL REACH OUT AT A LATER DATE TO MAKE SURE REFILLS ARE RECEIVED DUE TO HEALTH DEPT CHANGE IN STAFF;  patient is enrolled in [employer] [employer] patient assistance for  basaglar/trulicity   Patient Goals/Self-Care Activities Over the next 90 days, patient will:  - take medications as prescribed check glucose DAILY, document, and provide at future appointments  Follow Up Plan: Telephone follow up appointment with care management team member scheduled for: 3 months      Medication Assistance:  TMarva Pandaobtained through [place].  Enrollment ends 03/06/21  Patient's preferred pharmacy is:  [facility] [place] [address]Phone: [phone]: [phone] [facility] [address]Phone: [phone]: [phone] [facility] [address] [place]Phone: [phone]:  [phone]  Follow Up:  Patient agrees to Care Plan and Follow-up.  Plan: The patient will call with medication issues as advised to by CCM PharmD.    Regina Eck, PharmD, BCPS Clinical Pharmacist, [place]  II Phone [phone]

## 2021-01-04 DIAGNOSIS — Z794 Long term (current) use of insulin: Secondary | ICD-10-CM

## 2021-01-04 DIAGNOSIS — E1165 Type 2 diabetes mellitus with hyperglycemia: Secondary | ICD-10-CM

## 2021-01-04 DIAGNOSIS — E1122 Type 2 diabetes mellitus with diabetic chronic kidney disease: Secondary | ICD-10-CM | POA: Diagnosis not present

## 2021-01-04 DIAGNOSIS — N183 Chronic kidney disease, stage 3 unspecified: Secondary | ICD-10-CM

## 2021-01-05 ENCOUNTER — Encounter (HOSPITAL_COMMUNITY): Payer: Self-pay | Admitting: Radiology

## 2021-01-07 DIAGNOSIS — E6609 Other obesity due to excess calories: Secondary | ICD-10-CM | POA: Diagnosis not present

## 2021-01-07 DIAGNOSIS — E559 Vitamin D deficiency, unspecified: Secondary | ICD-10-CM | POA: Diagnosis not present

## 2021-01-07 DIAGNOSIS — N189 Chronic kidney disease, unspecified: Secondary | ICD-10-CM | POA: Diagnosis not present

## 2021-01-07 DIAGNOSIS — R809 Proteinuria, unspecified: Secondary | ICD-10-CM | POA: Diagnosis not present

## 2021-01-07 DIAGNOSIS — N2889 Other specified disorders of kidney and ureter: Secondary | ICD-10-CM | POA: Diagnosis not present

## 2021-01-07 DIAGNOSIS — I129 Hypertensive chronic kidney disease with stage 1 through stage 4 chronic kidney disease, or unspecified chronic kidney disease: Secondary | ICD-10-CM | POA: Diagnosis not present

## 2021-01-07 DIAGNOSIS — E790 Hyperuricemia without signs of inflammatory arthritis and tophaceous disease: Secondary | ICD-10-CM | POA: Diagnosis not present

## 2021-01-07 DIAGNOSIS — K760 Fatty (change of) liver, not elsewhere classified: Secondary | ICD-10-CM | POA: Diagnosis not present

## 2021-01-13 ENCOUNTER — Ambulatory Visit: Payer: Medicare HMO

## 2021-02-01 ENCOUNTER — Ambulatory Visit (INDEPENDENT_AMBULATORY_CARE_PROVIDER_SITE_OTHER): Payer: Medicare HMO

## 2021-02-01 VITALS — Ht 65.5 in | Wt 198.0 lb

## 2021-02-01 DIAGNOSIS — Z0001 Encounter for general adult medical examination with abnormal findings: Secondary | ICD-10-CM

## 2021-02-01 DIAGNOSIS — H539 Unspecified visual disturbance: Secondary | ICD-10-CM | POA: Diagnosis not present

## 2021-02-01 DIAGNOSIS — Z5941 Food insecurity: Secondary | ICD-10-CM | POA: Diagnosis not present

## 2021-02-01 DIAGNOSIS — Z0000 Encounter for general adult medical examination without abnormal findings: Secondary | ICD-10-CM

## 2021-02-01 NOTE — Patient Instructions (Signed)
Brooke [name] , Thank you for taking time to come for your Medicare Wellness Visit. I appreciate your ongoing commitment to your health goals. Please review the following plan we discussed and let me know if I can assist you in the future.   Screening recommendations/referrals: Colonoscopy: Cologuard done 11/22/2020. Call when ready to schedule GI referral.  Mammogram: Due Repeat annually  Bone Density: Done 01/13/2020 Due every 3 years.  Recommended yearly ophthalmology/optometry visit for glaucoma screening and checkup Recommended yearly dental visit for hygiene and checkup  Vaccinations: Influenza vaccine: Done 12/10/20 Repeat annually  Pneumococcal vaccine: Due.  Tdap vaccine: Due Repeat in 10 years  Shingles vaccine: Shingrix discussed. Please contact your pharmacy for coverage information.     Covid-19:Done 04/12/2019, 05/03/2019 and 01/14/2020  Advanced directives: Advance directive discussed with you today. Even though you declined this today, please call our office should you change your mind, and we can give you the proper paperwork for you to fill out.   Conditions/risks identified: Aim for 30 minutes of exercise or brisk walking each day, drink 6-8 glasses of water and eat lots of fruits and vegetables.   Next appointment: Follow up in one year for your annual wellness visit 2023.   Preventive Care 64 Years and Older, Female Preventive care refers to lifestyle choices and visits with your health care provider that can promote health and wellness. What does preventive care include? A yearly physical exam. This is also called an annual well check. Dental exams once or twice a year. Routine eye exams. Ask your health care provider how often you should have your eyes checked. Personal lifestyle choices, including: Daily care of your teeth and gums. Regular physical activity. Eating a healthy diet. Avoiding tobacco and drug use. Limiting alcohol use. Practicing safe  sex. Taking low-dose aspirin every day. Taking vitamin and mineral supplements as recommended by your health care provider. What happens during an annual well check? The services and screenings done by your health care provider during your annual well check will depend on your age, overall health, lifestyle risk factors, and family history of disease. Counseling  Your health care provider may ask you questions about your: Alcohol use. Tobacco use. Drug use. Emotional well-being. Home and relationship well-being. Sexual activity. Eating habits. History of falls. Memory and ability to understand (cognition). Work and work Statistician. Reproductive health. Screening  You may have the following tests or measurements: Height, weight, and BMI. Blood pressure. Lipid and cholesterol levels. These may be checked every 5 years, or more frequently if you are over 44 years old. Skin check. Lung cancer screening. You may have this screening every year starting at age 70 if you have a 30-pack-year history of smoking and currently smoke or have quit within the past 15 years. Fecal occult blood test (FOBT) of the stool. You may have this test every year starting at age 80. Flexible sigmoidoscopy or colonoscopy. You may have a sigmoidoscopy every 5 years or a colonoscopy every 10 years starting at age 62. Hepatitis C blood test. Hepatitis B blood test. Sexually transmitted disease (STD) testing. Diabetes screening. This is done by checking your blood sugar (glucose) after you have not eaten for a while (fasting). You may have this done every 1-3 years. Bone density scan. This is done to screen for osteoporosis. You may have this done starting at age 3. Mammogram. This may be done every 1-2 years. Talk to your health care provider about how often you should have regular mammograms.  Talk with your health care provider about your test results, treatment options, and if necessary, the need for more  tests. Vaccines  Your health care provider may recommend certain vaccines, such as: Influenza vaccine. This is recommended every year. Tetanus, diphtheria, and acellular pertussis (Tdap, Td) vaccine. You may need a Td booster every 10 years. Zoster vaccine. You may need this after age 15. Pneumococcal 13-valent conjugate (PCV13) vaccine. One dose is recommended after age 86. Pneumococcal polysaccharide (PPSV23) vaccine. One dose is recommended after age 30. Talk to your health care provider about which screenings and vaccines you need and how often you need them. This information is not intended to replace advice given to you by your health care provider. Make sure you discuss any questions you have with your health care provider. Document Released: 03/20/2015 Document Revised: 11/11/2015 Document Reviewed: 12/23/2014 Elsevier Interactive Patient Education  2017 [place] Prevention in the Home Falls can cause injuries. They can happen to people of all ages. There are many things you can do to make your home safe and to help prevent falls. What can I do on the outside of my home? Regularly fix the edges of walkways and driveways and fix any cracks. Remove anything that might make you trip as you walk through a door, such as a raised step or threshold. Trim any bushes or trees on the path to your home. Use bright outdoor lighting. Clear any walking paths of anything that might make someone trip, such as rocks or tools. Regularly check to see if handrails are loose or broken. Make sure that both sides of any steps have handrails. Any raised decks and porches should have guardrails on the edges. Have any leaves, snow, or ice cleared regularly. Use sand or salt on walking paths during winter. Clean up any spills in your garage right away. This includes oil or grease spills. What can I do in the bathroom? Use night lights. Install grab bars by the toilet and in the tub and shower.  Do not use towel bars as grab bars. Use non-skid mats or decals in the tub or shower. If you need to sit down in the shower, use a plastic, non-slip stool. Keep the floor dry. Clean up any water that spills on the floor as soon as it happens. Remove soap buildup in the tub or shower regularly. Attach bath mats securely with double-sided non-slip rug tape. Do not have throw rugs and other things on the floor that can make you trip. What can I do in the bedroom? Use night lights. Make sure that you have a light by your bed that is easy to reach. Do not use any sheets or blankets that are too big for your bed. They should not hang down onto the floor. Have a firm chair that has side arms. You can use this for support while you get dressed. Do not have throw rugs and other things on the floor that can make you trip. What can I do in the kitchen? Clean up any spills right away. Avoid walking on wet floors. Keep items that you use a lot in easy-to-reach places. If you need to reach something above you, use a strong step stool that has a grab bar. Keep electrical cords out of the way. Do not use floor polish or wax that makes floors slippery. If you must use wax, use non-skid floor wax. Do not have throw rugs and other things on the floor that can make  you trip. What can I do with my stairs? Do not leave any items on the stairs. Make sure that there are handrails on both sides of the stairs and use them. Fix handrails that are broken or loose. Make sure that handrails are as long as the stairways. Check any carpeting to make sure that it is firmly attached to the stairs. Fix any carpet that is loose or worn. Avoid having throw rugs at the top or bottom of the stairs. If you do have throw rugs, attach them to the floor with carpet tape. Make sure that you have a light switch at the top of the stairs and the bottom of the stairs. If you do not have them, ask someone to add them for you. What else  can I do to help prevent falls? Wear shoes that: Do not have high heels. Have rubber bottoms. Are comfortable and fit you well. Are closed at the toe. Do not wear sandals. If you use a stepladder: Make sure that it is fully opened. Do not climb a closed stepladder. Make sure that both sides of the stepladder are locked into place. Ask someone to hold it for you, if possible. Clearly mark and make sure that you can see: Any grab bars or handrails. First and last steps. Where the edge of each step is. Use tools that help you move around (mobility aids) if they are needed. These include: Canes. Walkers. Scooters. Crutches. Turn on the lights when you go into a dark area. Replace any light bulbs as soon as they burn out. Set up your furniture so you have a clear path. Avoid moving your furniture around. If any of your floors are uneven, fix them. If there are any pets around you, be aware of where they are. Review your medicines with your doctor. Some medicines can make you feel dizzy. This can increase your chance of falling. Ask your doctor what other things that you can do to help prevent falls. This information is not intended to replace advice given to you by your health care provider. Make sure you discuss any questions you have with your health care provider. Document Released: 12/18/2008 Document Revised: 07/30/2015 Document Reviewed: 03/28/2014 Elsevier Interactive Patient Education  2017 [employer].

## 2021-02-01 NOTE — Progress Notes (Signed)
Subjective:   Brooke [name] is a 65 y.o. female who presents for an Initial Medicare Annual Wellness Visit.Virtual Visit via Telephone Note  I connected with  [name] on 02/01/21 at  2:45 PM EST by telephone and verified that I am speaking with the correct person using two identifiers.  Location: Patient: Home Provider: [facility] Persons participating in the virtual visit: patient/Nurse Health Advisor   I discussed the limitations, risks, security and privacy concerns of performing an evaluation and management service by telephone and the availability of in person appointments. The patient expressed understanding and agreed to proceed.  Interactive audio and video telecommunications were attempted between this nurse and patient, however failed, due to patient having technical difficulties OR patient did not have access to video capability.  We continued and completed visit with audio only.  Some vital signs may be absent or patient reported.   [name], LPN  Review of Systems     Cardiac Risk Factors include: diabetes mellitus;hypertension;advanced age (>39men, >59 women);sedentary lifestyle;obesity (BMI >30kg/m2)     Objective:    Today's Vitals   02/01/21 1434  Weight: 198 lb (89.8 kg)  Height: 5' 5.5" (1.664 m)   Body mass index is 32.45 kg/m.  Advanced Directives 02/01/2021 05/16/2020 01/13/2020 05/03/2018 04/28/2018 01/05/2018 12/08/2017  Does Patient Have a Medical Advance Directive? No No No No No No No  Would patient like information on creating a medical advance directive? No - Patient declined No - Patient declined No - Patient declined No - Patient declined No - Patient declined - -    Current Medications (verified) Outpatient Encounter Medications as of 02/01/2021  Medication Sig   albuterol (VENTOLIN HFA) 108 (90 Base) MCG/ACT inhaler Inhale 2 puffs into the lungs every 6 (six) hours as needed for wheezing or shortness of breath.   amLODipine  (NORVASC) 10 MG tablet Take 1 tablet (10 mg total) by mouth daily.   atenolol (TENORMIN) 100 MG tablet TAKE 1 TABLET BY MOUTH IN THE MORNING   Cholecalciferol 25 MCG (1000 UT) capsule Take by mouth.   Dulaglutide (TRULICITY) 4.5 GN/5.6OZ SOPN Inject 4.5 mg as directed once a week.   empagliflozin (JARDIANCE) 25 MG TABS tablet Take 1 tablet (25 mg total) by mouth daily before breakfast.   Finerenone (KERENDIA) 10 MG TABS Take 10 mg by mouth daily.   hydrochlorothiazide (HYDRODIURIL) 25 MG tablet Take 1 tablet (25 mg total) by mouth daily.   Insulin Glargine (BASAGLAR KWIKPEN) 100 UNIT/ML Inject 25 Units into the skin at bedtime.   Insulin Pen Needle 32G X 6 MM MISC Use to inject insulin once daily. DX:E11.65   lisinopril (ZESTRIL) 10 MG tablet Take 1 tablet (10 mg total) by mouth daily.   lovastatin (MEVACOR) 40 MG tablet Take 1.5 tablets (60 mg total) by mouth at bedtime.   SYMBICORT 160-4.5 MCG/ACT inhaler INHALE 2 PUFFS INTO THE LUNGS 2 (TWO) TIMES DAILY.   No facility-administered encounter medications on file as of 02/01/2021.    Allergies (verified) Patient has no known allergies.   History: Past Medical History:  Diagnosis Date   Bronchitis    frequent bronchitis   Diabetes mellitus    Diabetic nephropathy (HCC)    Hypertension    Obesity (BMI 35.0-39.9 without comorbidity)    Vertigo    Past Surgical History:  Procedure Laterality Date   BREAST SURGERY Right 1974   Family History  Problem Relation Age of Onset   Diabetes Mother  Congestive Heart Failure Mother    Kidney disease Father    Diabetes Sister    Diabetes Brother    Hypertension Daughter    Hypertension Sister    Cervical cancer Sister    Social History   Socioeconomic History   Marital status: Married    Spouse name: Donnie   Number of children: 1   Years of education: Not on file   Highest education level: Not on file  Occupational History   Not on file  Tobacco Use   Smoking status: Never    Smokeless tobacco: Never  Vaping Use   Vaping Use: Never used  Substance and Sexual Activity   Alcohol use: No   Drug use: No   Sexual activity: Never  Other Topics Concern   Not on file  Social History Narrative   1 daughter    1 grandson   Social Determinants of Radio broadcast assistant Strain: Medium Risk   Difficulty of Paying Living Expenses: Somewhat hard  Food Insecurity: Landscape architect Present   Worried About Charity fundraiser in the Last Year: Often true   Arboriculturist in the Last Year: Sometimes true  Transportation Needs: No Transportation Needs   Lack of Transportation (Medical): No   Lack of Transportation (Non-Medical): No  Physical Activity: Inactive   Days of Exercise per Week: 0 days   Minutes of Exercise per Session: 0 min  Stress: No Stress Concern Present   Feeling of Stress : Only a little  Social Connections: Engineer, building services of Communication with Friends and Family: More than three times a week   Frequency of Social Gatherings with Friends and Family: More than three times a week   Attends Religious Services: More than 4 times per year   Active Member of [employer] or Organizations: Yes   Attends Music therapist: More than 4 times per year   Marital Status: Married    Tobacco Counseling Counseling given: Not Answered   Clinical Intake:  Pre-visit preparation completed: Yes  Pain : No/denies pain     BMI - recorded: 32.45 Nutritional Status: BMI > 30  Obese Nutritional Risks: None Diabetes: Yes CBG done?: No Did pt. bring in CBG monitor from home?: No  How often do you need to have someone help you when you read instructions, pamphlets, or other written materials from your doctor or pharmacy?: 1 - Never  Diabetic?Nutrition Risk Assessment:  Has the patient had any N/V/D within the last 2 months?  No  Does the patient have any non-healing wounds?  No  Has the patient had any unintentional weight  loss or weight gain?  No   Diabetes:  Is the patient diabetic?  Yes  If diabetic, was a CBG obtained today?  No  Did the patient bring in their glucometer from home?  No  How often do you monitor your CBG's? Daily.   Financial Strains and Diabetes Management:  Are you having any financial strains with the device, your supplies or your medication? No .  Does the patient want to be seen by Chronic Care Management for management of their diabetes?  No  Would the patient like to be referred to a Nutritionist or for Diabetic Management?  No   Diabetic Exams:  Diabetic Eye Exam: Completed 2021. Overdue for diabetic eye exam. Pt has been advised about the importance in completing this exam. A referral has been placed today. Message sent to referral coordinator  for scheduling purposes. Advised pt to expect a call from office referred to regarding appt.  Diabetic Foot Exam: Completed 04/14/2021. Pt has been advised about the importance in completing this exam.  Interpreter Needed?: No  Information entered by :: MJ Elody Kleinsasser, LPN   Activities of Daily Living In your present state of health, do you have any difficulty performing the following activities: 02/01/2021  Hearing? N  Vision? N  Difficulty concentrating or making decisions? N  Walking or climbing stairs? N  Dressing or bathing? N  Doing errands, shopping? N  Preparing Food and eating ? N  Using the Toilet? N  In the past six months, have you accidently leaked urine? N  Do you have problems with loss of bowel control? N  Managing your Medications? N  Managing your Finances? N  Housekeeping or managing your Housekeeping? N  Some recent data might be hidden    Patient Care Team: [name], FNP as PCP - General (Family Medicine) Lavera Guise, [facility] (Pharmacist)  Indicate any recent Medical Services you may have received from other than [facility] providers in the past year (date may be approximate).     Assessment:   This  is a routine wellness examination for Miara.  Hearing/Vision screen Hearing Screening - Comments:: No hearing issues. Vision Screening - Comments:: Glasses. [facility] in [place]. 2021.  Dietary issues and exercise activities discussed: Current Exercise Habits: The patient does not participate in regular exercise at present, Exercise limited by: cardiac condition(s)   Goals Addressed             This Visit's Progress    Exercise 3x per week (30 min per time)       Increase as tolerated.        Depression Screen PHQ 2/9 Scores 02/01/2021 12/10/2020 11/05/2020 07/28/2020 04/14/2020 01/13/2020 01/07/2020  PHQ - 2 Score 0 0 0 0 0 0 0  PHQ- 9 Score - 0 2 1 - - -    Fall Risk Fall Risk  02/01/2021 12/10/2020 11/05/2020 07/28/2020 04/14/2020  Falls in the past year? 0 0 0 0 0  Number falls in past yr: 0 - - - -  Injury with Fall? 0 - - - -  Risk for fall due to : No Fall Risks - - - -  Follow up Falls prevention discussed - - - -    FALL RISK PREVENTION PERTAINING TO THE HOME:  Any stairs in or around the home? No  If so, are there any without handrails? No  Home free of loose throw rugs in walkways, pet beds, electrical cords, etc? Yes  Adequate lighting in your home to reduce risk of falls? Yes   ASSISTIVE DEVICES UTILIZED TO PREVENT FALLS:  Life alert? No  Use of a cane, walker or w/c? No  Grab bars in the bathroom? No  Shower chair or bench in shower? No  Elevated toilet seat or a handicapped toilet? Yes   TIMED UP AND GO:  Was the test performed? No .    Cognitive Function:     6CIT Screen 02/01/2021 01/13/2020  What Year? 0 points 0 points  What month? 0 points 0 points  What time? 0 points 0 points  Count back from 20 0 points 0 points  Months in reverse 0 points 0 points  Repeat phrase 0 points 0 points  Total Score 0 0    Immunizations Immunization History  Administered Date(s) Administered   Fluad Quad(high Dose  65+) 03/13/2019, 11/28/2019, 12/10/2020    PFIZER(Purple Top)SARS-COV-2 Vaccination 04/12/2019, 05/03/2019, 01/14/2020    TDAP status: Due, Education has been provided regarding the importance of this vaccine. Advised may receive this vaccine at local pharmacy or Health Dept. Aware to provide a copy of the vaccination record if obtained from local pharmacy or Health Dept. Verbalized acceptance and understanding.  Flu Vaccine status: Up to date  Pneumococcal vaccine status: Due, Education has been provided regarding the importance of this vaccine. Advised may receive this vaccine at local pharmacy or Health Dept. Aware to provide a copy of the vaccination record if obtained from local pharmacy or Health Dept. Verbalized acceptance and understanding.  Covid-19 vaccine status: Information provided on how to obtain vaccines.   Qualifies for Shingles Vaccine? Yes   Zostavax completed No   Shingrix Completed?: No.    Education has been provided regarding the importance of this vaccine. Patient has been advised to call insurance company to determine out of pocket expense if they have not yet received this vaccine. Advised may also receive vaccine at local pharmacy or Health Dept. Verbalized acceptance and understanding.  Screening Tests Health Maintenance  Topic Date Due   Pneumonia Vaccine 61+ Years old (1 - PCV) Never done   OPHTHALMOLOGY EXAM  Never done   COVID-19 Vaccine (4 - Booster for Pfizer series) 03/10/2020   Zoster Vaccines- Shingrix (1 of 2) 02/04/2021 (Originally 03/03/1975)   Hepatitis C Screening  04/14/2021 (Originally 03/03/1974)   MAMMOGRAM  11/05/2021 (Originally 03/03/2004)   TETANUS/TDAP  11/05/2021 (Originally 03/03/1975)   FOOT EXAM  04/14/2021   HEMOGLOBIN A1C  05/05/2021   Fecal DNA (Cologuard)  11/23/2023   INFLUENZA VACCINE  Completed   DEXA SCAN  Completed   HPV VACCINES  Aged Out    Health Maintenance  Health Maintenance Due  Topic Date Due   Pneumonia Vaccine 68+ Years old (1 - PCV) Never done    OPHTHALMOLOGY EXAM  Never done   COVID-19 Vaccine (4 - Booster for [place] series) 03/10/2020    Colorectal cancer screening: Type of screening: Cologuard. Completed 11/22/20. Repeat every 3 years  Mammogram status: Ordered Pt requested order be deferred until 2023.Marland Kitchen Pt provided with contact info and advised to call to schedule appt.   Bone Density status: Completed 01/13/2020. Results reflect: Bone density results: NORMAL. Repeat every 3 years.  Lung Cancer Screening: (Low Dose CT Chest recommended if Age 76-80 years, 30 pack-year currently smoking OR have quit w/in 15years.) does not qualify.  NON SMOKER  Additional Screening:  Hepatitis C Screening: does qualify; Completed 01/23/2020  Vision Screening: Recommended annual ophthalmology exams for early detection of glaucoma and other disorders of the eye. Is the patient up to date with their annual eye exam?  No  Who is the provider or what is the name of the office in which the patient attends annual eye exams? [facility] If pt is not established with a provider, would they like to be referred to a provider to establish care? No .   Dental Screening: Recommended annual dental exams for proper oral hygiene  Community Resource Referral / Chronic Care Management: CRR required this visit?  No   CCM required this visit?  Yes      Plan:     I have personally reviewed and noted the following in the patient's chart:   Medical and social history Use of alcohol, tobacco or illicit drugs  Current medications and supplements including opioid prescriptions. Patient is not  currently taking opioid prescriptions. Functional ability and status Nutritional status Physical activity Advanced directives List of other physicians Hospitalizations, surgeries, and ER visits in previous 12 months Vitals Screenings to include cognitive, depression, and falls Referrals and appointments  In addition, I have reviewed and discussed with patient  certain preventive protocols, quality metrics, and best practice recommendations. A written personalized care plan for preventive services as well as general preventive health recommendations were provided to patient.     [name], LPN   [id number]   Nurse Notes: Phone visit. Pt states she is having issues with food insecurity and also with being able to afford her glasses. CCM referral offered and patient accepted. Referral placed. Discussed pneumococcal, shingles and tdap vaccines and how to obtain.

## 2021-02-04 ENCOUNTER — Other Ambulatory Visit (HOSPITAL_COMMUNITY): Payer: Self-pay | Admitting: Nephrology

## 2021-02-04 ENCOUNTER — Other Ambulatory Visit: Payer: Self-pay | Admitting: Nephrology

## 2021-02-04 DIAGNOSIS — E1122 Type 2 diabetes mellitus with diabetic chronic kidney disease: Secondary | ICD-10-CM

## 2021-02-04 DIAGNOSIS — N2889 Other specified disorders of kidney and ureter: Secondary | ICD-10-CM

## 2021-02-07 ENCOUNTER — Other Ambulatory Visit: Payer: Self-pay | Admitting: Family Medicine

## 2021-02-07 DIAGNOSIS — I152 Hypertension secondary to endocrine disorders: Secondary | ICD-10-CM

## 2021-02-10 ENCOUNTER — Telehealth: Payer: Self-pay | Admitting: Family Medicine

## 2021-02-10 ENCOUNTER — Encounter: Payer: Self-pay | Admitting: Family Medicine

## 2021-02-10 ENCOUNTER — Ambulatory Visit (INDEPENDENT_AMBULATORY_CARE_PROVIDER_SITE_OTHER): Payer: Medicare HMO | Admitting: Family Medicine

## 2021-02-10 VITALS — BP 125/76 | HR 73 | Temp 97.2°F | Ht 65.5 in | Wt 198.8 lb

## 2021-02-10 DIAGNOSIS — I152 Hypertension secondary to endocrine disorders: Secondary | ICD-10-CM | POA: Diagnosis not present

## 2021-02-10 DIAGNOSIS — E559 Vitamin D deficiency, unspecified: Secondary | ICD-10-CM | POA: Diagnosis not present

## 2021-02-10 DIAGNOSIS — E1121 Type 2 diabetes mellitus with diabetic nephropathy: Secondary | ICD-10-CM

## 2021-02-10 DIAGNOSIS — E1159 Type 2 diabetes mellitus with other circulatory complications: Secondary | ICD-10-CM | POA: Diagnosis not present

## 2021-02-10 DIAGNOSIS — E785 Hyperlipidemia, unspecified: Secondary | ICD-10-CM | POA: Diagnosis not present

## 2021-02-10 DIAGNOSIS — J410 Simple chronic bronchitis: Secondary | ICD-10-CM

## 2021-02-10 DIAGNOSIS — E1165 Type 2 diabetes mellitus with hyperglycemia: Secondary | ICD-10-CM

## 2021-02-10 DIAGNOSIS — N2889 Other specified disorders of kidney and ureter: Secondary | ICD-10-CM | POA: Insufficient documentation

## 2021-02-10 DIAGNOSIS — Z794 Long term (current) use of insulin: Secondary | ICD-10-CM

## 2021-02-10 DIAGNOSIS — G479 Sleep disorder, unspecified: Secondary | ICD-10-CM | POA: Diagnosis not present

## 2021-02-10 DIAGNOSIS — E1122 Type 2 diabetes mellitus with diabetic chronic kidney disease: Secondary | ICD-10-CM

## 2021-02-10 DIAGNOSIS — N183 Chronic kidney disease, stage 3 unspecified: Secondary | ICD-10-CM

## 2021-02-10 DIAGNOSIS — E1169 Type 2 diabetes mellitus with other specified complication: Secondary | ICD-10-CM | POA: Diagnosis not present

## 2021-02-10 LAB — BAYER DCA HB A1C WAIVED: HB A1C (BAYER DCA - WAIVED): 10.7 % — ABNORMAL HIGH (ref 4.8–5.6)

## 2021-02-10 MED ORDER — TRAZODONE HCL 50 MG PO TABS: 50.0000 mg | ORAL_TABLET | Freq: Every evening | ORAL | 3 refills | Status: DC | PRN

## 2021-02-10 MED ORDER — HYDROCHLOROTHIAZIDE 25 MG PO TABS: 25.0000 mg | ORAL_TABLET | Freq: Every day | ORAL | 1 refills | Status: DC

## 2021-02-10 NOTE — Telephone Encounter (Signed)
Samples left up front for patient. Advised that [facility] would contact her about an appt with Almyra Free. Patient verbalized understanding

## 2021-02-10 NOTE — Telephone Encounter (Signed)
Pt says she is out of her [place] Clamp Rx and needs to speak with Almyra Free on getting more refills or samples.

## 2021-02-10 NOTE — Telephone Encounter (Signed)
Wanting a refill of kerenda sent to patient assistance.

## 2021-02-10 NOTE — Progress Notes (Signed)
Assessment & Plan:  1. Type 2 diabetes mellitus with hyperglycemia, with long-term current use of insulin (HCC) A1c 10.7 today which is increased from 9.9 three months ago. - Diabetes is not at goal of A1c < 7. - Medications: continue current medications, with an increase in basaglar from 25 units to 30 units daily. - Home glucose monitoring: 180-220. Continue to take and keep log. Not a candidate for libre due to insurance.  - Patient is currently taking a statin. Patient is taking an ACE-inhibitor/ARB.  - Instruction/counseling given: discussed the need for weight loss and discussed diet - Diabetic eye exam requested from [facility] again.  Diabetes Health Maintenance Due  Topic Date Due   OPHTHALMOLOGY EXAM  Never done   FOOT EXAM  04/14/2021   HEMOGLOBIN A1C  05/05/2021    Lab Results  Component Value Date   LABMICR 214.4 04/14/2020   LABMICR 38.7 03/13/2019   - Bayer DCA Hb A1c Waived  2. Hypertension associated with diabetes ([place]) - Well controlled on current regimen - CBC with Differential/Platelet - CMP14+EGFR - hydrochlorothiazide (HYDRODIURIL) 25 MG tablet; Take 1 tablet (25 mg total) by mouth daily.  Dispense: 90 tablet; Refill: 1  3. Hyperlipidemia associated with type 2 diabetes mellitus ([place]) - continue statin - encouraged healthy diet and exercise - Lipid panel  4. CKD stage 3 due to type 2 diabetes mellitus ([place]) Managed by nephrology.  5. Simple chronic bronchitis ([place]) Well controlled on current regimen.   6. Difficulty sleeping - initiate trazodone 50-100 mg as needed for sleep - suspect difficulty managing anxiety despite low GAD-7 scores - traZODone (DESYREL) 50 MG tablet; Take 1-2 tablets (50-100 mg total) by mouth at bedtime as needed for sleep.  Dispense: 30 tablet; Refill: 3  7. Vitamin D insufficiency Nephrology recently started a supplement.  8. Mass of right kidney - Ambulatory referral to Urology STAT. MRI scheduled for next  Tuesday.   Return in about 6 weeks (around 03/24/2021) for DM & sleep.  [name], NP Student  I personally was present during the history, physical exam, and medical decision-making activities of this service and have verified that the service and findings are accurately documented in the nurse practitioner student's note.  [name], MSN, APRN, FNP-C [facility]   Subjective:    Patient ID: Brooke [name], female    DOB: August 15, 1955, 65 y.o.   MRN: [id number]  Patient Care Team: [name], FNP as PCP - General (Family Medicine) Lavera Guise, [facility] (Pharmacist)   Chief Complaint:  Chief Complaint  Patient presents with   Diabetes   Hyperlipidemia    Check up of chronic medical conditions     HPI: Brooke [name] is a 65 y.o. female presenting on 02/10/2021 for Diabetes and Hyperlipidemia (Check up of chronic medical conditions )  Diabetes: Patient presents for follow up of diabetes. Current symptoms include: hyperglycemia. Known diabetic complications: nephropathy. Medication compliance: Yes, she is on Jardiance 25 mg, Trulicity 4.5 mg, and Basaglar 25 units QHS. Current diet: in general, an "unhealthy" diet, but has made attempts to reduce her sugar intake . She is snacking more since finding out about the mass on her kidney because she is unable to sleep. Current exercise: walking. Home blood sugar records:  180s-220s before breakfast . Is she  on ACE inhibitor or angiotensin II receptor blocker? Yes (Lisinopril). Is she on a statin? Yes (Lovastatin). She was stopped on Metformin due to her kidney function.  Hypertension: Controlled on lisinopril 72m-HCTZ 25 mg. She does not take her BP at home but she has a cuff available.   HLD: she is taking lovastatin 40 mg daily.  The 10-year ASCVD risk score (Arnett DK, et al., 2019) is: 20.6%   Values used to calculate the score:     Age: [age]     Sex: Female     Is Non-Hispanic African  American: Yes     Diabetic: Yes     Tobacco smoker: No     Systolic Blood Pressure: 1929mmHg     Is BP treated: Yes     HDL Cholesterol: 31 mg/dL     Total Cholesterol: 180 mg/dL  CKD: She is taking KSaudi Arabiadaily. Her nephrologist recommended decreasing lisinopril to 10 mg due to decreased kidney function. They noted a right mass on her kidney ultrasound and she is scheduled to have a MRI Tuesday to evaluate. They recommended consulting urology, but patient has not heard anything regarding this. She was also noted to have a high uric acid level but no history of gout flares.  Vitamin D deficiency: nephrology noted her to have vitamin D insufficiency and initiated her on 1,000 units daily.   Positive Cologuard: She received a positive cologuard and is not willing to get a colonoscopy at this time.   New complaints: She is not sleeping well since being told that she has a mass on her kidneys. She states she will lay awake for hours after laying down to go to sleep. She has taken tylenol-pm to help her get sleep.    Social history:  Relevant past medical, surgical, family and social history reviewed and updated as indicated. Interim medical history since our last visit reviewed.  Allergies and medications reviewed and updated.  DATA REVIEWED: CHART IN EPIC  ROS: Negative unless specifically indicated above in HPI.    Current Outpatient Medications:    albuterol (VENTOLIN HFA) 108 (90 Base) MCG/ACT inhaler, Inhale 2 puffs into the lungs every 6 (six) hours as needed for wheezing or shortness of breath., Disp: 18 g, Rfl: 2   amLODipine (NORVASC) 10 MG tablet, Take 1 tablet (10 mg total) by mouth daily., Disp: 90 tablet, Rfl: 1   atenolol (TENORMIN) 100 MG tablet, TAKE 1 TABLET BY MOUTH IN THE MORNING, Disp: 90 tablet, Rfl: 0   Cholecalciferol 25 MCG (1000 UT) capsule, Take by mouth., Disp: , Rfl:    Dulaglutide (TRULICITY) 4.5 MWK/4.6KMSOPN, Inject 4.5 mg as directed once a week.,  Disp: 6 mL, Rfl: 1   empagliflozin (JARDIANCE) 25 MG TABS tablet, Take 1 tablet (25 mg total) by mouth daily before breakfast., Disp: 90 tablet, Rfl: 1   Finerenone (KERENDIA) 10 MG TABS, Take 10 mg by mouth daily., Disp: 30 tablet, Rfl: 2   Insulin Glargine (BASAGLAR KWIKPEN) 100 UNIT/ML, Inject 25 Units into the skin at bedtime., Disp: , Rfl:    Insulin Pen Needle 32G X 6 MM MISC, Use to inject insulin once daily. DX:E11.65, Disp: 100 each, Rfl: 11   lisinopril (ZESTRIL) 10 MG tablet, Take 1 tablet (10 mg total) by mouth daily., Disp: 30 tablet, Rfl: 2   lovastatin (MEVACOR) 40 MG tablet, Take 1.5 tablets (60 mg total) by mouth at bedtime., Disp: 135 tablet, Rfl: 1   SYMBICORT 160-4.5 MCG/ACT inhaler, INHALE 2 PUFFS INTO THE LUNGS 2 (TWO) TIMES DAILY., Disp: 3 each, Rfl: 1   hydrochlorothiazide (HYDRODIURIL) 25 MG tablet, Take 1 tablet (25 mg total)  by mouth daily., Disp: 90 tablet, Rfl: 1   No Known Allergies Past Medical History:  Diagnosis Date   Bronchitis    frequent bronchitis   Diabetes mellitus    Diabetic nephropathy (HCC)    Hypertension    Obesity (BMI 35.0-39.9 without comorbidity)    Vertigo     Past Surgical History:  Procedure Laterality Date   BREAST SURGERY Right 1974    Social History   Socioeconomic History   Marital status: Married    Spouse name: Donnie   Number of children: 1   Years of education: Not on file   Highest education level: Not on file  Occupational History   Not on file  Tobacco Use   Smoking status: Never   Smokeless tobacco: Never  Vaping Use   Vaping Use: Never used  Substance and Sexual Activity   Alcohol use: No   Drug use: No   Sexual activity: Never  Other Topics Concern   Not on file  Social History Narrative   1 daughter    1 grandson   Social Determinants of Radio broadcast assistant Strain: Medium Risk   Difficulty of Paying Living Expenses: Somewhat hard  Food Insecurity: Landscape architect Present   Worried About  Charity fundraiser in the Last Year: Often true   Arboriculturist in the Last Year: Sometimes true  Transportation Needs: No Transportation Needs   Lack of Transportation (Medical): No   Lack of Transportation (Non-Medical): No  Physical Activity: Inactive   Days of Exercise per Week: 0 days   Minutes of Exercise per Session: 0 min  Stress: No Stress Concern Present   Feeling of Stress : Only a little  Social Connections: Engineer, building services of Communication with Friends and Family: More than three times a week   Frequency of Social Gatherings with Friends and Family: More than three times a week   Attends Religious Services: More than 4 times per year   Active Member of [employer] or Organizations: Yes   Attends Music therapist: More than 4 times per year   Marital Status: Married  Human resources officer Violence: Not At Risk   Fear of Current or Ex-Partner: No   Emotionally Abused: No   Physically Abused: No   Sexually Abused: No        Objective:    BP 125/76   Pulse 73   Temp (!) 97.2 F (36.2 C) (Temporal)   Ht 5' 5.5" (1.664 m)   Wt 90.2 kg   LMP 03/16/2012   SpO2 97%   BMI 32.58 kg/m   Wt Readings from Last 3 Encounters:  02/10/21 198 lb 12.8 oz (90.2 kg)  02/01/21 198 lb (89.8 kg)  12/10/20 197 lb 9.6 oz (89.6 kg)    Physical Exam Vitals reviewed.  Constitutional:      General: She is not in acute distress.    Appearance: Normal appearance. She is obese. She is not ill-appearing, toxic-appearing or diaphoretic.  HENT:     Head: Normocephalic and atraumatic.  Eyes:     General: No scleral icterus.       Right eye: No discharge.        Left eye: No discharge.     Conjunctiva/sclera: Conjunctivae normal.  Cardiovascular:     Rate and Rhythm: Normal rate and regular rhythm.     Heart sounds: Normal heart sounds. No murmur heard.   No friction rub. No gallop.  Pulmonary:     Effort: Pulmonary effort is normal. No respiratory distress.      Breath sounds: Normal breath sounds. No stridor. No wheezing, rhonchi or rales.  Musculoskeletal:        General: Normal range of motion.     Cervical back: Normal range of motion.  Skin:    General: Skin is warm and dry.     Capillary Refill: Capillary refill takes less than 2 seconds.  Neurological:     General: No focal deficit present.     Mental Status: She is alert and oriented to person, place, and time. Mental status is at baseline.  Psychiatric:        Mood and Affect: Mood normal.        Behavior: Behavior normal.        Thought Content: Thought content normal.        Judgment: Judgment normal.    Lab Results  Component Value Date   TSH 1.880 03/13/2019   Lab Results  Component Value Date   WBC 8.4 11/05/2020   HGB 12.9 11/05/2020   HCT 39.6 11/05/2020   MCV 80 11/05/2020   PLT 411 11/05/2020   Lab Results  Component Value Date   NA 138 12/10/2020   K 4.5 12/10/2020   CO2 22 12/10/2020   GLUCOSE 176 (H) 12/10/2020   BUN 30 (H) 12/10/2020   CREATININE 1.92 (H) 12/10/2020   BILITOT 0.3 11/05/2020   ALKPHOS 87 11/05/2020   AST 13 11/05/2020   ALT 14 11/05/2020   PROT 6.9 11/05/2020   ALBUMIN 3.9 11/05/2020   CALCIUM 9.3 12/10/2020   ANIONGAP 11 05/16/2020   EGFR 28 (L) 12/10/2020   Lab Results  Component Value Date   CHOL 180 11/05/2020   Lab Results  Component Value Date   HDL 31 (L) 11/05/2020   Lab Results  Component Value Date   LDLCALC 85 11/05/2020   Lab Results  Component Value Date   TRIG 390 (H) 11/05/2020   Lab Results  Component Value Date   CHOLHDL 5.8 (H) 11/05/2020   Lab Results  Component Value Date   HGBA1C 9.9 (H) 11/05/2020

## 2021-02-10 NOTE — Telephone Encounter (Signed)
  Prescription Request  02/10/2021  Is this a "Controlled Substance" medicine? no  Have you seen your PCP in the last 2 weeks? yes  If YES, route message to pool  -  If NO, patient needs to be scheduled for appointment.  What is the name of the medication or equipment? Finerenone (KERENDIA) 10 MG TABS  Have you contacted your pharmacy to request a refill? yes   Which pharmacy would you like this sent to? [facility] in [place]   Patient notified that their request is being sent to the clinical staff for review and that they should receive a response within 2 business days.

## 2021-02-10 NOTE — Telephone Encounter (Signed)
Please grab patient samples in the meantime (top shelf to the right) She needs to make an appt I'm backed up on patient assistance ~1 month  I will route to [employer] for CCM scheduling This can be done over the phone (pt will likely have to bring in financials though)

## 2021-02-11 ENCOUNTER — Telehealth: Payer: Self-pay

## 2021-02-11 LAB — CBC WITH DIFFERENTIAL/PLATELET
Basophils Absolute: 0.1 10*3/uL (ref 0.0–0.2)
Basos: 1 %
EOS (ABSOLUTE): 0.6 10*3/uL — ABNORMAL HIGH (ref 0.0–0.4)
Eos: 6 %
Hematocrit: 39.2 % (ref 34.0–46.6)
Hemoglobin: 12.8 g/dL (ref 11.1–15.9)
Immature Grans (Abs): 0 10*3/uL (ref 0.0–0.1)
Immature Granulocytes: 0 %
Lymphocytes Absolute: 3.7 10*3/uL — ABNORMAL HIGH (ref 0.7–3.1)
Lymphs: 39 %
MCH: 26.6 pg (ref 26.6–33.0)
MCHC: 32.7 g/dL (ref 31.5–35.7)
MCV: 82 fL (ref 79–97)
Monocytes Absolute: 0.6 10*3/uL (ref 0.1–0.9)
Monocytes: 6 %
Neutrophils Absolute: 4.5 10*3/uL (ref 1.4–7.0)
Neutrophils: 48 %
Platelets: 411 10*3/uL (ref 150–450)
RBC: 4.81 x10E6/uL (ref 3.77–5.28)
RDW: 13.9 % (ref 11.7–15.4)
WBC: 9.5 10*3/uL (ref 3.4–10.8)

## 2021-02-11 LAB — CMP14+EGFR
ALT: 15 IU/L (ref 0–32)
AST: 13 IU/L (ref 0–40)
Albumin/Globulin Ratio: 1.3 (ref 1.2–2.2)
Albumin: 4 g/dL (ref 3.8–4.8)
Alkaline Phosphatase: 102 IU/L (ref 44–121)
BUN/Creatinine Ratio: 14 (ref 12–28)
BUN: 26 mg/dL (ref 8–27)
Bilirubin Total: 0.2 mg/dL (ref 0.0–1.2)
CO2: 23 mmol/L (ref 20–29)
Calcium: 9.4 mg/dL (ref 8.7–10.3)
Chloride: 104 mmol/L (ref 96–106)
Creatinine, Ser: 1.82 mg/dL — ABNORMAL HIGH (ref 0.57–1.00)
Globulin, Total: 3.2 g/dL (ref 1.5–4.5)
Glucose: 181 mg/dL — ABNORMAL HIGH (ref 70–99)
Potassium: 4.9 mmol/L (ref 3.5–5.2)
Sodium: 140 mmol/L (ref 134–144)
Total Protein: 7.2 g/dL (ref 6.0–8.5)
eGFR: 30 mL/min/{1.73_m2} — ABNORMAL LOW (ref >59)

## 2021-02-11 LAB — LIPID PANEL
Chol/HDL Ratio: 5.3 ratio — ABNORMAL HIGH (ref 0.0–4.4)
Cholesterol, Total: 186 mg/dL (ref 100–199)
HDL: 35 mg/dL — ABNORMAL LOW (ref >39)
LDL Chol Calc (NIH): 98 mg/dL (ref 0–99)
Triglycerides: 312 mg/dL — ABNORMAL HIGH (ref 0–149)
VLDL Cholesterol Cal: 53 mg/dL — ABNORMAL HIGH (ref 5–40)

## 2021-02-11 MED ORDER — KERENDIA 10 MG PO TABS: 10.0000 mg | ORAL_TABLET | Freq: Every day | ORAL | 1 refills | Status: DC

## 2021-02-11 NOTE — Telephone Encounter (Signed)
Refill sent.

## 2021-02-11 NOTE — Chronic Care Management (AMB) (Signed)
  Chronic Care Management   Note  02/11/2021 Name: Brooke [name] MRN: [id number] DOB: 1955-09-28  Brooke [name] is a 65 y.o. year old female who is a primary care patient of [name], FNP. Brooke [name] is currently enrolled in care management services. An additional referral for pharm D  was placed.   Follow up plan: Unsuccessful telephone outreach attempt made. A HIPAA compliant phone message was left for the patient providing contact information and requesting a return call.  The care management team will reach out to the patient again over the next 5 days.  If patient returns call to provider office, please advise to call [facility]  at [place], [place], [place],  [address] Direct Dial: [phone] [name] Website: [email or web address]

## 2021-02-11 NOTE — Telephone Encounter (Signed)
Left detailed message.   

## 2021-02-11 NOTE — Telephone Encounter (Signed)
Pt has been scheduled.  °

## 2021-02-11 NOTE — Chronic Care Management (AMB) (Signed)
  Care Management   Note  02/11/2021 Name: Brooke [name] MRN: [id number] DOB: January 23, 1956  Brooke [name] is a 65 y.o. year old female who is a primary care patient of [name], FNP and is actively engaged with the care management team. I reached out to Brooke [name] by phone today to assist with scheduling an initial visit with the Pharmacist  Follow up plan: Telephone appointment with care management team member scheduled for:03/17/2021  Brooke [name], [name], [place], [place] [address] Direct Dial: [phone] [name] Website: [email or web address]

## 2021-02-16 ENCOUNTER — Ambulatory Visit (HOSPITAL_COMMUNITY)
Admission: RE | Admit: 2021-02-16 | Discharge: 2021-02-16 | Disposition: A | Discharge status: Home / Self Care | Payer: Medicare HMO | Source: Ambulatory Visit | Attending: Nephrology | Admitting: Nephrology

## 2021-02-16 ENCOUNTER — Other Ambulatory Visit: Payer: Self-pay

## 2021-02-16 DIAGNOSIS — E1122 Type 2 diabetes mellitus with diabetic chronic kidney disease: Secondary | ICD-10-CM | POA: Diagnosis not present

## 2021-02-16 DIAGNOSIS — Z780 Asymptomatic menopausal state: Secondary | ICD-10-CM | POA: Diagnosis not present

## 2021-02-16 DIAGNOSIS — N2889 Other specified disorders of kidney and ureter: Secondary | ICD-10-CM | POA: Diagnosis not present

## 2021-02-16 DIAGNOSIS — K760 Fatty (change of) liver, not elsewhere classified: Secondary | ICD-10-CM | POA: Diagnosis not present

## 2021-02-16 MED ORDER — GADOBUTROL 1 MMOL/ML IV SOLN
10.0000 mL | Freq: Once | INTRAVENOUS | Status: AC | PRN
  Administered 2021-02-16: 10 mL via INTRAVENOUS

## 2021-02-18 ENCOUNTER — Telehealth: Payer: Self-pay | Admitting: Family Medicine

## 2021-02-18 NOTE — Telephone Encounter (Signed)
Returned call - wanting an update for PA with [place].  Aware that no PA has been started since patient is on patient assistance. Sending to Almyra Free as an FYI since she does patient assistance.

## 2021-02-19 ENCOUNTER — Telehealth: Payer: Self-pay

## 2021-02-19 NOTE — Telephone Encounter (Signed)
° °  Telephone encounter was:  Successful.  02/19/2021 Name: Brooke [name] MRN: [id number] DOB: 1956/03/07  Brooke [name] is a 66 y.o. year old female who is a primary care patient of [name], FNP . The community resource team was consulted for assistance with Food Insecurity and eyeglasses  Care guide performed the following interventions: Spoke with patient about placing referral through [facility] for SNAP and [place] distribution. Gave patient information for [employer] and [employer] for assistance with eyeglasses.  Follow Up Plan:  Care guide will follow up with patient by phone over the next 5-85 days  Syndey Jaskolski, AAS Paralegal, [facility]  [address], [place] [address] ??[email or web address]   ?? [phone]   [email or web address]

## 2021-02-19 NOTE — Telephone Encounter (Signed)
I have appt scheduled with her on 03/17/21 Please provide additional samples if needed

## 2021-02-19 NOTE — Telephone Encounter (Signed)
We do not have this samples. Correct or do you have some?

## 2021-02-22 ENCOUNTER — Other Ambulatory Visit: Payer: Self-pay | Admitting: Family Medicine

## 2021-02-22 DIAGNOSIS — E1165 Type 2 diabetes mellitus with hyperglycemia: Secondary | ICD-10-CM

## 2021-02-22 DIAGNOSIS — E1121 Type 2 diabetes mellitus with diabetic nephropathy: Secondary | ICD-10-CM

## 2021-02-22 DIAGNOSIS — E1122 Type 2 diabetes mellitus with diabetic chronic kidney disease: Secondary | ICD-10-CM

## 2021-02-23 DIAGNOSIS — N2889 Other specified disorders of kidney and ureter: Secondary | ICD-10-CM | POA: Diagnosis not present

## 2021-02-23 DIAGNOSIS — E1122 Type 2 diabetes mellitus with diabetic chronic kidney disease: Secondary | ICD-10-CM | POA: Diagnosis not present

## 2021-02-23 DIAGNOSIS — E559 Vitamin D deficiency, unspecified: Secondary | ICD-10-CM | POA: Diagnosis not present

## 2021-02-23 DIAGNOSIS — I129 Hypertensive chronic kidney disease with stage 1 through stage 4 chronic kidney disease, or unspecified chronic kidney disease: Secondary | ICD-10-CM | POA: Diagnosis not present

## 2021-02-23 DIAGNOSIS — N189 Chronic kidney disease, unspecified: Secondary | ICD-10-CM | POA: Diagnosis not present

## 2021-02-23 NOTE — Telephone Encounter (Signed)
Samples placed up front- left detailed message

## 2021-02-23 NOTE — Telephone Encounter (Signed)
We do! Top shelf to the right

## 2021-02-25 ENCOUNTER — Telehealth: Payer: Self-pay

## 2021-02-25 ENCOUNTER — Telehealth: Payer: Self-pay | Admitting: Family Medicine

## 2021-02-25 NOTE — Telephone Encounter (Signed)
Attempted to contact patient - NA  Samples were placed up front 2 days ago - she should not need more samples

## 2021-02-25 NOTE — Telephone Encounter (Signed)
° °  Telephone encounter was:  Successful.  02/25/2021 Name: Makenzy Krist MRN: [id number] DOB: 07/19/1955  Cecia Egge is a 65 y.o. year old female who is a primary care patient of [name], FNP . The community resource team was consulted for assistance with Financial Difficulties related to food, glasses  Care guide performed the following interventions: Clifton James at[email or web address] has reached out to Mickel Baas at [facility] and Dr. Henrietta Dine at [facility] regarding referral placed 02/19/21. Spoke with patient to let her know that her referrals have been received and she should be contacted in the next few days. Patient is following up with local [employer].  Follow Up Plan:  No further follow up planned at this time. The patient has been provided with needed resources.  Chancy Smigiel, AAS Paralegal, [place] [facility]  [address], [place] [address] ??[email or web address]   ?? [phone]   [email or web address]

## 2021-03-03 ENCOUNTER — Ambulatory Visit: Payer: Medicare HMO | Admitting: Urology

## 2021-03-05 ENCOUNTER — Other Ambulatory Visit: Payer: Self-pay

## 2021-03-05 ENCOUNTER — Encounter: Payer: Self-pay | Admitting: Urology

## 2021-03-05 ENCOUNTER — Ambulatory Visit: Payer: Medicare HMO | Admitting: Urology

## 2021-03-05 ENCOUNTER — Telehealth: Payer: Self-pay | Admitting: *Deleted

## 2021-03-05 DIAGNOSIS — N2889 Other specified disorders of kidney and ureter: Secondary | ICD-10-CM | POA: Diagnosis not present

## 2021-03-05 DIAGNOSIS — N939 Abnormal uterine and vaginal bleeding, unspecified: Secondary | ICD-10-CM

## 2021-03-05 DIAGNOSIS — N189 Chronic kidney disease, unspecified: Secondary | ICD-10-CM | POA: Diagnosis not present

## 2021-03-05 LAB — MICROSCOPIC EXAMINATION: Renal Epithel, UA: NONE SEEN /hpf

## 2021-03-05 LAB — URINALYSIS, ROUTINE W REFLEX MICROSCOPIC
Bilirubin, UA: NEGATIVE
Ketones, UA: NEGATIVE
Leukocytes,UA: NEGATIVE
Nitrite, UA: NEGATIVE
Protein,UA: NEGATIVE
Specific Gravity, UA: 1.015 (ref 1.005–1.030)
Urobilinogen, Ur: 0.2 mg/dL (ref 0.2–1.0)
pH, UA: 5.5 (ref 5.0–7.5)

## 2021-03-05 NOTE — Telephone Encounter (Signed)
Carrington Clamp APPROVED  PA Case: [id number], Status: Approved, Coverage Starts on: 03/07/2020 12:00:00 AM, Coverage Ends on: 03/06/2022 12:00:00 AM. Questions? Contact [phone].   Pharmacy aware.

## 2021-03-05 NOTE — Telephone Encounter (Signed)
Prior Auth for [employer]  (Key: [id number])  PA #  [id number]  Sent to plan

## 2021-03-05 NOTE — Progress Notes (Signed)

## 2021-03-05 NOTE — Progress Notes (Signed)
Assessment: 1. Renal mass, right   2. Chronic kidney disease, unspecified CKD stage   3. Vaginal bleeding     Plan: I personally reviewed the patient's recent MRI showing a 4 cm heterogeneous right renal mass.  There may be a component of adipose tissue centrally located within this mass.  Consequently, I think it would be reasonable to evaluate further with a CT abdomen without contrast.  If the mass is consistent with a angiomyolipoma, consideration could be given to embolization.  If the mass is consistent with a renal cell carcinoma, preferred management would be either surgical removal or percutaneous ablation.  Given the depth of the mass, a partial nephrectomy may not be feasible.  A radical nephrectomy would unfortunately worsen her already compromised renal function. Schedule for CT abdomen without contrast. I will contact her with results when available. I also recommended that she schedule gynecologic evaluation for her vaginal bleeding and abnormal endometrial thickness noted on MRI.  Chief Complaint:  Chief Complaint  Patient presents with   renal mass    History of Present Illness:  Brooke [name] is a 65 y.o. year old female who is seen in consultation from [name], FNP for evaluation of a right renal mass.  She was initially evaluated for chronic kidney disease with a renal ultrasound on 12/05/2020 which showed a 3.8 cm mass of the inferior pole of the right kidney.  MRI of the abdomen with and without contrast from 02/16/2021 confirmed presence of a heterogeneous mass arising from the lower pole the right kidney measuring 4.2 x 3.9 cm, no evidence of renal vein involvement or adenopathy. Recent creatinine 1.82 with GFR of 30.  She does report intermittent episodes of vaginal bleeding for the past several months.  She is not having any gross hematuria.  No dysuria or flank pain.  Past Medical History:  Past Medical History:  Diagnosis Date   Bronchitis     frequent bronchitis   Diabetes mellitus    Diabetic nephropathy ([place])    Hepatic steatosis    Hypertension    Hyperuricemia 11/27/2020   Mass of right kidney    Obesity (BMI 35.0-39.9 without comorbidity)    Vertigo    Vitamin D insufficiency 11/27/2020    Past Surgical History:  Past Surgical History:  Procedure Laterality Date   BREAST SURGERY Right 1974    Allergies:  No Known Allergies  Family History:  Family History  Problem Relation Age of Onset   Diabetes Mother    Congestive Heart Failure Mother    Kidney disease Father    Diabetes Sister    Diabetes Brother    Hypertension Daughter    Hypertension Sister    Cervical cancer Sister     Social History:  Social History   Tobacco Use   Smoking status: Never   Smokeless tobacco: Never  Vaping Use   Vaping Use: Never used  Substance Use Topics   Alcohol use: No   Drug use: No    Review of symptoms:  Constitutional:  Negative for unexplained weight loss, night sweats, fever, chills ENT:  Negative for nose bleeds, sinus pain, painful swallowing CV:  Negative for chest pain, shortness of breath, exercise intolerance, palpitations, loss of consciousness Resp:  Negative for cough, wheezing, shortness of breath GI:  Negative for nausea, vomiting, diarrhea, bloody stools GU:  Positives noted in HPI; otherwise negative for gross hematuria, dysuria, urinary incontinence Neuro:  Negative for seizures, poor balance, limb weakness, slurred speech  Psych:  Negative for lack of energy, depression, anxiety Endocrine:  Negative for polydipsia, polyuria, symptoms of hypoglycemia (dizziness, hunger, sweating) Hematologic:  Negative for anemia, purpura, petechia, prolonged or excessive bleeding, use of anticoagulants  Allergic:  Negative for difficulty breathing or choking as a result of exposure to anything; no shellfish allergy; no allergic response (rash/itch) to materials, foods  Physical exam: LMP 03/16/2012  GENERAL  APPEARANCE:  Well appearing, well developed, well nourished, NAD HEENT: Atraumatic, Normocephalic, oropharynx clear. NECK: Supple without lymphadenopathy or thyromegaly. LUNGS: Clear to auscultation bilaterally. HEART: Regular Rate and Rhythm without murmurs, gallops, or rubs. ABDOMEN: Soft, non-tender, No Masses. EXTREMITIES: Moves all extremities well.  Without clubbing, cyanosis, or edema. NEUROLOGIC:  Alert and oriented x 3, normal gait, CN II-XII grossly intact.  MENTAL STATUS:  Appropriate. BACK:  Non-tender to palpation.  No CVAT SKIN:  Warm, dry and intact.    Results: U/A:  0-5 WBC, 0-2 RBC, few bacteria

## 2021-03-12 ENCOUNTER — Telehealth: Payer: Self-pay | Admitting: Family Medicine

## 2021-03-12 NOTE — Telephone Encounter (Signed)
Appt scheduled for next week Jardiance samples left up front

## 2021-03-12 NOTE — Telephone Encounter (Signed)
Pt would like to speak to [place] about her insulin. Per pt this is urgent. Please call back asap.

## 2021-03-13 ENCOUNTER — Other Ambulatory Visit: Payer: Self-pay | Admitting: Family Medicine

## 2021-03-13 DIAGNOSIS — E1159 Type 2 diabetes mellitus with other circulatory complications: Secondary | ICD-10-CM

## 2021-03-13 DIAGNOSIS — I152 Hypertension secondary to endocrine disorders: Secondary | ICD-10-CM

## 2021-03-17 ENCOUNTER — Ambulatory Visit (INDEPENDENT_AMBULATORY_CARE_PROVIDER_SITE_OTHER): Payer: Medicare HMO | Admitting: Pharmacist

## 2021-03-17 DIAGNOSIS — N183 Chronic kidney disease, stage 3 unspecified: Secondary | ICD-10-CM

## 2021-03-17 DIAGNOSIS — E1122 Type 2 diabetes mellitus with diabetic chronic kidney disease: Secondary | ICD-10-CM

## 2021-03-17 DIAGNOSIS — E1165 Type 2 diabetes mellitus with hyperglycemia: Secondary | ICD-10-CM

## 2021-03-17 DIAGNOSIS — E1169 Type 2 diabetes mellitus with other specified complication: Secondary | ICD-10-CM

## 2021-03-17 NOTE — Progress Notes (Signed)
Chronic Care Management Pharmacy Note  03/17/2021 Name:  Brooke [name] MRN:  [id number] DOB:  1955-05-02  Summary: T2DM, CKD  Recommendations/Changes made from today's visit:  Diabetes: Uncontrolled a1c 10.9% (has increased); current treatment: BASAGLAR TO 30 UNITS, TRULICITY 1.2XN, JARDIANCE 55m daily;  Metformin was discontinued last month due to GFR 28-->new start Kerendia (pt sees nephro next week, repeat labs were within normal limits)--trying to get, too expensive Continues to work on controlling her diabetes, but reports multiple health stressors Denies personal and family history of Medullary thyroid cancer (MTC) Current glucose readings: fasting glucose: <200, post prandial glucose: having higher post prandials in PM (late night snacking--working on) Denies hypoglycemic/hyperglycemic symptoms Discussed meal planning options and Plate method for healthy eating Avoid sugary drinks and desserts Incorporate balanced protein, non starchy veggies, 1 serving of carbohydrate with each meal Increase water intake Increase physical activity as able Current exercise: N/A Educated on DIABETES MEDICATIONS, BG READINGS Assessed patient finances. Enrollment sent for the lilly cares patient assistance for basaglar/trulicity; enrollment sent for BI cares (jardiance); and AZ&me (symbicort) patient assistance; will work [place] follow [place]   Patient Goals/Self-Care Activities Over the next 90 days, patient will:  - take medications as prescribed check glucose DAILY, document, and provide at future appointments  Follow Up Plan: Telephone follow up appointment with care management team member scheduled for: 3 months  Subjective: Brooke [name] an 66y.o. year old female who is a primary patient of JLoman Brooklyn FNP.  The CCM team was consulted for assistance with disease management and care coordination needs.    Engaged with patient face to face for follow up visit in  response to provider referral for pharmacy case management and/or care coordination services.   Consent to Services:  The patient was given information about Chronic Care Management services, agreed to services, and gave verbal consent prior to initiation of services.  Please see initial visit note for detailed documentation.   Patient Care Team: JLoman Brooklyn FNP as PCP - General (Family Medicine) [facility]  Objective:  Lab Results  Component Value Date   CREATININE 1.82 (H) 02/10/2021   CREATININE 1.92 (H) 12/10/2020   CREATININE 1.92 (H) 11/05/2020    Lab Results  Component Value Date   HGBA1C 10.7 (H) 02/10/2021   Last diabetic Eye exam: No results found for: HMDIABEYEEXA  Last diabetic Foot exam: No results found for: HMDIABFOOTEX      Component Value Date/Time   CHOL 186 02/10/2021 1029   TRIG 312 (H) 02/10/2021 1029   HDL 35 (L) 02/10/2021 1029   CHOLHDL 5.3 (H) 02/10/2021 1029   LDLCALC 98 02/10/2021 1029    Hepatic Function Latest Ref Rng & Units 02/10/2021 11/05/2020 07/28/2020  Total Protein 6.0 - 8.5 g/dL 7.2 6.9 7.1  Albumin 3.8 - 4.8 g/dL 4.0 3.9 4.2  AST 0 - 40 IU/L '13 13 22  ' ALT 0 - 32 IU/L '15 14 24  ' Alk Phosphatase 44 - 121 IU/L 102 87 90  Total Bilirubin 0.0 - 1.2 mg/dL 0.2 0.3 0.2  Bilirubin, Direct 0.0 - 0.2 mg/dL - - -    Lab Results  Component Value Date/Time   TSH 1.880 03/13/2019 09:55 AM    CBC Latest Ref Rng & Units 02/10/2021 11/05/2020 07/28/2020  WBC 3.4 - 10.8 x10E3/uL 9.5 8.4 8.8  Hemoglobin 11.1 - 15.9 g/dL 12.8 12.9 12.2  Hematocrit 34.0 - 46.6 % 39.2 39.6 38.4  Platelets 150 -  450 x10E3/uL 411 411 398    No results found for: VD25OH  Clinical ASCVD: No  The 10-year ASCVD risk score (Arnett DK, et al., 2019) is: 23%   Values used to calculate the score:     Age: 30 years     Sex: Female     Is Non-Hispanic African American: Yes     Diabetic: Yes     Tobacco smoker: No     Systolic Blood Pressure:  130 mmHg     Is BP treated: Yes     HDL Cholesterol: 35 mg/dL     Total Cholesterol: 186 mg/dL    Other: (CHADS2VASc if Afib, PHQ9 if depression, MMRC or CAT for COPD, ACT, DEXA)  Social History   Tobacco Use  Smoking Status Never  Smokeless Tobacco Never   BP Readings from Last 3 Encounters:  02/10/21 125/76  12/10/20 121/76  11/05/20 121/75   Pulse Readings from Last 3 Encounters:  02/10/21 73  12/10/20 76  11/05/20 70   Wt Readings from Last 3 Encounters:  02/10/21 198 lb 12.8 oz (90.2 kg)  02/01/21 198 lb (89.8 kg)  12/10/20 197 lb 9.6 oz (89.6 kg)    Assessment: Review of patient past medical history, allergies, medications, health status, including review of consultants reports, laboratory and other test data, was performed as part of comprehensive evaluation and provision of chronic care management services.   SDOH:  (Social Determinants of Health) assessments and interventions performed:    CCM Care Plan  No Known Allergies  Medications Reviewed Today     Reviewed by Lavera Guise, [facility] (Pharmacist) on 03/17/21 at 1418  Med List Status: <None>   Medication Order Taking? Sig Documenting Provider Last Dose Status Informant  albuterol (VENTOLIN HFA) 108 (90 Base) MCG/ACT inhaler [id number] No Inhale 2 puffs into the lungs every 6 (six) hours as needed for wheezing or shortness of breath. [name], FNP Taking Active Self  amLODipine (NORVASC) 10 MG tablet [id number] No Take 1 tablet (10 mg total) by mouth daily. [name], FNP Taking Active   atenolol (TENORMIN) 100 MG tablet [id number] No TAKE 1 TABLET BY MOUTH IN THE MORNING [name], FNP Taking Active   Cholecalciferol 25 MCG (1000 UT) capsule [id number] No Take by mouth. [provider] Taking Active   Dulaglutide (TRULICITY) 4.5 JO/8.7OM SOPN [id number] No Inject 4.5 mg as directed once a week. [name], FNP Taking Active            Med Note Parthenia Ames Dec 31, 2020 12:05 PM) Via lilly cares patient assistance program   empagliflozin (JARDIANCE) 25 MG TABS tablet [id number] No Take 1 tablet (25 mg total) by mouth daily before breakfast. [name], FNP Taking Active   Finerenone [facility]) 10 MG TABS [id number] No Take 10 mg by mouth daily. [name], FNP Taking Active   hydrochlorothiazide (HYDRODIURIL) 25 MG tablet [id number] No Take 1 tablet (25 mg total) by mouth daily. [name], FNP Taking Active   Insulin Glargine [facility]) 100 UNIT/ML [id number] No Inject 30 Units into the skin at bedtime. [provider] Taking Active            Med Note Parthenia Ames Dec 31, 2020 12:05 PM) Via lilly cares patient assistance program  Insulin Pen Needle 32G X 6 MM MISC [id number] No Use to inject insulin once daily. BW:G66.59 [name], FNP  Taking Active   lisinopril (ZESTRIL) 10 MG tablet [id number]  Take 1 tablet by mouth once daily [name] F, FNP  Active   lovastatin (MEVACOR) 40 MG tablet [id number] No Take 1.5 tablets (60 mg total) by mouth at bedtime. [name], FNP Taking Active   SYMBICORT 160-4.5 MCG/ACT inhaler [id number] No INHALE 2 PUFFS INTO THE LUNGS 2 (TWO) TIMES DAILY. [name], FNP Taking Active   traZODone (DESYREL) 50 MG tablet [id number] No Take 1-2 tablets (50-100 mg total) by mouth at bedtime as needed for sleep. [name], FNP Taking Active             Patient Active Problem List   Diagnosis Date Noted   Difficulty sleeping 02/10/2021   Mass of right kidney 02/10/2021   Positive colorectal cancer screening using Cologuard test 12/10/2020   Vitamin D insufficiency 11/27/2020   Diabetic nephropathy ([place])    CKD stage 3 due to type 2 diabetes mellitus ([place]) 03/14/2019   Type 2 diabetes mellitus with hyperglycemia, with long-term current use of insulin ([place]) 03/13/2019   Hyperlipidemia associated with type 2 diabetes mellitus ([place]) 03/13/2019   Hypertension  associated with diabetes ([place]) 03/13/2019   Obesity (BMI 30-39.9) 03/13/2019   Chronic bronchitis ([place]) 03/13/2019    Immunization History  Administered Date(s) Administered   Fluad Quad(high Dose 65+) 03/13/2019, 11/28/2019, 12/10/2020   PFIZER(Purple Top)SARS-COV-2 Vaccination 04/12/2019, 05/03/2019, 01/14/2020    Conditions to be addressed/monitored: DMII and CKD Stage 3b  Care Plan : PHARMD MEDICATION MANAGEMENT  Updates made by Lavera Guise, [place] since 03/22/2021 12:00 AM     Problem: DISEASE PROGRESSION PREVENTION      Long-Range Goal: T2DM   Recent Progress: Not on track  Priority: High  Note:   Current Barriers:  Unable to independently afford treatment regimen Unable to achieve control of T2DM   Pharmacist Clinical Goal(s):  Over the next 90 days, patient will verbalize ability to afford treatment regimen maintain control of T2DM as evidenced by GOAL A1C<7%  through collaboration with PharmD and provider.    Interventions: 1:1 collaboration with [name], FNP regarding development and update of comprehensive plan of care as evidenced by provider attestation and co-signature Inter-disciplinary care team collaboration (see longitudinal plan of care) Comprehensive medication review performed; medication list updated in electronic medical record  Diabetes: Uncontrolled a1c 10.9% (has increased); current treatment: BASAGLAR TO 30 UNITS, TRULICITY 7.0YF, JARDIANCE 76m daily;  Metformin was discontinued last month due to GFR 28-->new start Kerendia (pt sees nephro next week, repeat labs were within normal limits)--trying to get, too expensive Continues to work on controlling her diabetes, but reports multiple health stressors Denies personal and family history of Medullary thyroid cancer (MTC) Current glucose readings: fasting glucose: <200, post prandial glucose: having higher post prandials in PM (late night snacking--working on) Denies  hypoglycemic/hyperglycemic symptoms Discussed meal planning options and Plate method for healthy eating Avoid sugary drinks and desserts Incorporate balanced protein, non starchy veggies, 1 serving of carbohydrate with each meal Increase water intake Increase physical activity as able Current exercise: N/A Educated on DIABETES MEDICATIONS, BG READINGS Assessed patient finances. Enrollment sent for the lilly cares patient assistance for basaglar/trulicity; enrollment sent for BI cares (jardiance); and AZ&me (symbicort) patient assistance; will work on [place] follow up   Patient Goals/Self-Care Activities Over the next 90 days, patient will:  - take medications as prescribed check glucose DAILY, document, and provide at future appointments  Follow Up Plan: Telephone  follow up appointment with care management team member scheduled for: 3 months      Medication Assistance:  az&me (symbicort), lilly cares (trulicity/basaglar), bi cares (jardiance)  Patient's preferred pharmacy is:  [facility] [address] [place] [address] Phone: [phone] Fax: [phone]  [facility], [place] [address] Phone: [phone] Fax: [phone]  [facility] [place], [place] - [address] [place] [address] Phone: [phone] Fax: [phone]  Follow Up:  Patient agrees to Care Plan and Follow-up.  Plan: Telephone follow up appointment with care management team member scheduled for:  3 months  Regina Eck, PharmD, BCPS Clinical Pharmacist, [place]  II Phone [phone]

## 2021-03-19 NOTE — Patient Instructions (Addendum)
Visit Information  Thank you for taking time to visit with me today. Please don't hesitate to contact me if I can be of assistance to you before our next scheduled telephone appointment.  Following are the goals we discussed today:  Current Barriers:  Unable to independently afford treatment regimen Unable to achieve control of T2DM   Pharmacist Clinical Goal(s):  Over the next 90 days, patient will verbalize ability to afford treatment regimen maintain control of T2DM as evidenced by GOAL A1C<7%  through collaboration with PharmD and provider.    Interventions: 1:1 collaboration with [name], FNP regarding development and update of comprehensive plan of care as evidenced by provider attestation and co-signature Inter-disciplinary care team collaboration (see longitudinal plan of care) Comprehensive medication review performed; medication list updated in electronic medical record  Diabetes: Uncontrolled a1c 10.9% (has increased); current treatment: BASAGLAR TO 30 UNITS, TRULICITY 4.5MG , JARDIANCE 25mg  daily;  Metformin was discontinued last month due to GFR 28-->new start Kerendia (pt sees nephro next week, repeat labs were within normal limits)--trying to get, too expensive Continues to work on controlling her diabetes, but reports multiple health stressors Denies personal and family history of Medullary thyroid cancer (MTC) Current glucose readings: fasting glucose: <200, post prandial glucose: having higher post prandials in PM (late night snacking--working on) Denies hypoglycemic/hyperglycemic symptoms Discussed meal planning options and Plate method for healthy eating Avoid sugary drinks and desserts Incorporate balanced protein, non starchy veggies, 1 serving of carbohydrate with each meal Increase water intake Increase physical activity as able Current exercise: N/A Educated on DIABETES MEDICATIONS, BG READINGS Assessed patient finances. Enrollment sent for the lilly  cares patient assistance for basaglar/trulicity; enrollment sent for BI cares (jardiance); and AZ&me (symbicort) patient assistance; will work on [place] at follow up   Patient Goals/Self-Care Activities Over the next 90 days, patient will:  - take medications as prescribed check glucose DAILY, document, and provide at future appointments  Follow Up Plan: Telephone follow up appointment with care management team member scheduled for: 3 months    Please call the care guide team at [phone] if you need to cancel or reschedule your appointment.   If you are experiencing a Mental Health or [place] or need someone to talk to, please call the Suicide and Crisis Lifeline: 988   The patient verbalized understanding of instructions, educational materials, and care plan provided today and agreed to receive a mailed copy of patient instructions, educational materials, and care plan.   Signature Regina Eck, PharmD, BCPS Clinical Pharmacist, [place]  II Phone [phone]

## 2021-03-24 ENCOUNTER — Ambulatory Visit (INDEPENDENT_AMBULATORY_CARE_PROVIDER_SITE_OTHER): Payer: Medicare HMO | Admitting: Family Medicine

## 2021-03-24 ENCOUNTER — Encounter: Payer: Self-pay | Admitting: Family Medicine

## 2021-03-24 VITALS — BP 128/73 | HR 72 | Temp 97.2°F | Ht 65.5 in | Wt 204.6 lb

## 2021-03-24 DIAGNOSIS — Z794 Long term (current) use of insulin: Secondary | ICD-10-CM

## 2021-03-24 DIAGNOSIS — R9389 Abnormal findings on diagnostic imaging of other specified body structures: Secondary | ICD-10-CM | POA: Diagnosis not present

## 2021-03-24 DIAGNOSIS — G479 Sleep disorder, unspecified: Secondary | ICD-10-CM

## 2021-03-24 DIAGNOSIS — R195 Other fecal abnormalities: Secondary | ICD-10-CM | POA: Diagnosis not present

## 2021-03-24 DIAGNOSIS — E1165 Type 2 diabetes mellitus with hyperglycemia: Secondary | ICD-10-CM

## 2021-03-24 DIAGNOSIS — I152 Hypertension secondary to endocrine disorders: Secondary | ICD-10-CM

## 2021-03-24 DIAGNOSIS — E1159 Type 2 diabetes mellitus with other circulatory complications: Secondary | ICD-10-CM

## 2021-03-24 DIAGNOSIS — N939 Abnormal uterine and vaginal bleeding, unspecified: Secondary | ICD-10-CM | POA: Diagnosis not present

## 2021-03-24 MED ORDER — LISINOPRIL 10 MG PO TABS: 10.0000 mg | ORAL_TABLET | Freq: Every day | ORAL | 1 refills | Status: DC

## 2021-03-24 MED ORDER — DOXEPIN HCL 10 MG PO CAPS: 10.0000 mg | ORAL_CAPSULE | Freq: Every day | ORAL | 2 refills | Status: DC

## 2021-03-24 NOTE — Patient Instructions (Addendum)
Increase Basaglar to 35 units.   Start Doxepin. Stop Trazodone.

## 2021-03-24 NOTE — Progress Notes (Signed)
Assessment & Plan:  1. Type 2 diabetes mellitus with hyperglycemia, with long-term current use of insulin (HCC) Lab Results  Component Value Date   HGBA1C 10.7 (H) 02/10/2021   HGBA1C 9.9 (H) 11/05/2020   HGBA1C 11.4 (H) 07/28/2020    - Diabetes is not at goal of A1c < 7. - Medications: continue current medications with increase in Basaglar from 30 units to 35 units - Home glucose monitoring: continue to monitor  - Patient is currently taking a statin. Patient is taking an ACE-inhibitor/ARB.  - Instruction/counseling given: discussed the need for weight loss, discussed diet, and provided printed educational material  Diabetes Health Maintenance Due  Topic Date Due   OPHTHALMOLOGY EXAM  Never done   FOOT EXAM  04/14/2021   HEMOGLOBIN A1C  08/11/2021    Lab Results  Component Value Date   LABMICR See below: 03/05/2021   LABMICR 214.4 04/14/2020    2. Hypertension associated with diabetes ([place]) - Well controlled on current regimen - lisinopril (ZESTRIL) 10 MG tablet; Take 1 tablet (10 mg total) by mouth daily.  Dispense: 90 tablet; Refill: 1  3. Difficulty sleeping - uncontrolled, change from trazodone to doxepin to help  - doxepin (SINEQUAN) 10 MG capsule; Take 1 capsule (10 mg total) by mouth at bedtime.  Dispense: 30 capsule; Refill: 2  4-5. Vaginal bleeding/Thickened endometrium - discussed need for evaluation by OBGYN, patient declined at this time  6. Positive colorectal cancer screening using Cologuard test - discussed need for evaluation by GI, patient declined at this time   Return in about 7 weeks (around 05/11/2021) for DM, sleep.  [name], NP Student  I personally was present during the history, physical exam, and medical decision-making activities of this service and have verified that the service and findings are accurately documented in the nurse practitioner student's note.  [name], MSN, APRN, FNP-C [facility]  Subjective:    Patient ID: Brooke [name], female    DOB: 06-28-1955, 67 y.o.   MRN: [id number]  Patient Care Team: [name], FNP as PCP - General (Family Medicine) Lavera Guise, [facility] (Pharmacist)   Chief Complaint:  Chief Complaint  Patient presents with   Diabetes   Sleeping Problem    6 week follow up. Patient states it is the same.     HPI: Brooke [name] is a 66 y.o. female presenting on 03/24/2021 for Diabetes and Sleeping Problem (6 week follow up. Patient states it is the same. )  Diabetes: Patient presents for follow up of diabetes. Current symptoms include: hyperglycemia. Known diabetic complications: nephropathy. Medication compliance: Yes, she is on Jardiance 25 mg, Trulicity 4.5 mg, and Basaglar 30 units QHS, stopped metformin due to kidney function. Current diet: in general, an "unhealthy" diet, but has made attempts to reduce her sugar intake . She is snacking more since finding out about the mass on her kidney because she is unable to sleep. Current exercise: walking. Home blood sugar records:  170-180s before breakfast; rarely over 200; lowest of 134 . Is she  on ACE inhibitor or angiotensin II receptor blocker? Yes (Lisinopril). Is she on a statin? Yes (Lovastatin).   Insomnia: she has been taking trazodone to help with sleep. She states that she takes 1 tablet and it does not help much, but she is scared to take 2 because she does not want to be too sleepy. She states she has tried benadryl in the past and it made her too woozy  and sleepy that she was scared she would not wake up. She is presenting with obvious anxiety despite her GAD score of 0, which is likely impacting her ability to sleep.   GAD 7 : Generalized Anxiety Score 03/24/2021 02/10/2021 12/10/2020 11/05/2020  Nervous, Anxious, on Edge 0 0 0 0  Control/stop worrying 0 0 0 0  Worry too much - different things 0 0 0 0  Trouble relaxing 0 0 0 0  Restless 0 0 0 0  Easily annoyed or irritable  0 0 0 0  Afraid - awful might happen 0 0 0 0  Total GAD 7 Score 0 0 0 0  Anxiety Difficulty Not difficult at all Not difficult at all Not difficult at all Not difficult at all    She was noted to have endometrial thickening on her abdominal MRI 02/16/21 and her urologist noted that she does have abnormal vaginal bleeding. She states she was advised by her urologist to make an appointment with GYN to evaluate this. She is declining at this time. She also has a positive cologuard that she is not willing to investigate. She states she wants to deal with one thing at a time, and right now she has enough going on with her diabetes and kidney function.   New complaints:  None   Social history:  Relevant past medical, surgical, family and social history reviewed and updated as indicated. Interim medical history since our last visit reviewed.  Allergies and medications reviewed and updated.  DATA REVIEWED: CHART IN EPIC  ROS: Negative unless specifically indicated above in HPI.    Current Outpatient Medications:    albuterol (VENTOLIN HFA) 108 (90 Base) MCG/ACT inhaler, Inhale 2 puffs into the lungs every 6 (six) hours as needed for wheezing or shortness of breath., Disp: 18 g, Rfl: 2   amLODipine (NORVASC) 10 MG tablet, Take 1 tablet (10 mg total) by mouth daily., Disp: 90 tablet, Rfl: 1   atenolol (TENORMIN) 100 MG tablet, TAKE 1 TABLET BY MOUTH IN THE MORNING, Disp: 90 tablet, Rfl: 0   Cholecalciferol 25 MCG (1000 UT) capsule, Take by mouth., Disp: , Rfl:    doxepin (SINEQUAN) 10 MG capsule, Take 1 capsule (10 mg total) by mouth at bedtime., Disp: 30 capsule, Rfl: 2   Dulaglutide (TRULICITY) 4.5 RK/2.7CW SOPN, Inject 4.5 mg as directed once a week., Disp: 6 mL, Rfl: 1   empagliflozin (JARDIANCE) 25 MG TABS tablet, Take 1 tablet (25 mg total) by mouth daily before breakfast., Disp: 90 tablet, Rfl: 1   Finerenone (KERENDIA) 10 MG TABS, Take 10 mg by mouth daily., Disp: 90 tablet, Rfl: 1    hydrochlorothiazide (HYDRODIURIL) 25 MG tablet, Take 1 tablet (25 mg total) by mouth daily., Disp: 90 tablet, Rfl: 1   Insulin Glargine (BASAGLAR KWIKPEN) 100 UNIT/ML, Inject 35 Units into the skin at bedtime., Disp: , Rfl:    Insulin Pen Needle 32G X 6 MM MISC, Use to inject insulin once daily. DX:E11.65, Disp: 100 each, Rfl: 11   lovastatin (MEVACOR) 40 MG tablet, Take 1.5 tablets (60 mg total) by mouth at bedtime., Disp: 135 tablet, Rfl: 1   SYMBICORT 160-4.5 MCG/ACT inhaler, INHALE 2 PUFFS INTO THE LUNGS 2 (TWO) TIMES DAILY., Disp: 3 each, Rfl: 1   lisinopril (ZESTRIL) 10 MG tablet, Take 1 tablet (10 mg total) by mouth daily., Disp: 90 tablet, Rfl: 1   No Known Allergies Past Medical History:  Diagnosis Date   Bronchitis    frequent bronchitis  Diabetes mellitus    Diabetic nephropathy ([place])    Hepatic steatosis    Hypertension    Hyperuricemia 11/27/2020   Mass of right kidney    Obesity (BMI 35.0-39.9 without comorbidity)    Vertigo    Vitamin D insufficiency 11/27/2020    Past Surgical History:  Procedure Laterality Date   BREAST SURGERY Right 1974    Social History   Socioeconomic History   Marital status: Married    Spouse name: Donnie   Number of children: 1   Years of education: Not on file   Highest education level: Not on file  Occupational History   Not on file  Tobacco Use   Smoking status: Never   Smokeless tobacco: Never  Vaping Use   Vaping Use: Never used  Substance and Sexual Activity   Alcohol use: No   Drug use: No   Sexual activity: Never  Other Topics Concern   Not on file  Social History Narrative   1 daughter    1 grandson   Social Determinants of Radio broadcast assistant Strain: Medium Risk   Difficulty of Paying Living Expenses: Somewhat hard  Food Insecurity: Landscape architect Present   Worried About Charity fundraiser in the Last Year: Sometimes true   Arboriculturist in the Last Year: Sometimes true  Transportation Needs: No  Transportation Needs   Lack of Transportation (Medical): No   Lack of Transportation (Non-Medical): No  Physical Activity: Inactive   Days of Exercise per Week: 0 days   Minutes of Exercise per Session: 0 min  Stress: No Stress Concern Present   Feeling of Stress : Only a little  Social Connections: Engineer, building services of Communication with Friends and Family: More than three times a week   Frequency of Social Gatherings with Friends and Family: More than three times a week   Attends Religious Services: More than 4 times per year   Active Member of [employer] or Organizations: Yes   Attends Music therapist: More than 4 times per year   Marital Status: Married  Human resources officer Violence: Not At Risk   Fear of Current or Ex-Partner: No   Emotionally Abused: No   Physically Abused: No   Sexually Abused: No        Objective:    BP 128/73    Pulse 72    Temp (!) 97.2 F (36.2 C) (Temporal)    Ht 5' 5.5" (1.664 m)    Wt 92.8 kg    LMP 03/16/2012    SpO2 100%    BMI 33.53 kg/m   Wt Readings from Last 3 Encounters:  03/24/21 204 lb 9.6 oz (92.8 kg)  02/10/21 198 lb 12.8 oz (90.2 kg)  02/01/21 198 lb (89.8 kg)    Physical Exam Vitals reviewed.  Constitutional:      General: She is not in acute distress.    Appearance: Normal appearance. She is obese. She is not ill-appearing, toxic-appearing or diaphoretic.  HENT:     Head: Normocephalic and atraumatic.  Eyes:     General: No scleral icterus.       Right eye: No discharge.        Left eye: No discharge.     Conjunctiva/sclera: Conjunctivae normal.  Cardiovascular:     Rate and Rhythm: Normal rate and regular rhythm.     Heart sounds: Normal heart sounds. No murmur heard.   No friction rub. No gallop.  Pulmonary:     Effort: Pulmonary effort is normal. No respiratory distress.     Breath sounds: Normal breath sounds. No stridor. No wheezing, rhonchi or rales.  Musculoskeletal:        General: Normal  range of motion.     Cervical back: Normal range of motion.  Skin:    General: Skin is warm and dry.     Capillary Refill: Capillary refill takes less than 2 seconds.  Neurological:     General: No focal deficit present.     Mental Status: She is alert and oriented to person, place, and time. Mental status is at baseline.  Psychiatric:        Mood and Affect: Mood normal.        Behavior: Behavior normal.        Thought Content: Thought content normal.        Judgment: Judgment normal.    Lab Results  Component Value Date   TSH 1.880 03/13/2019   Lab Results  Component Value Date   WBC 9.5 02/10/2021   HGB 12.8 02/10/2021   HCT 39.2 02/10/2021   MCV 82 02/10/2021   PLT 411 02/10/2021   Lab Results  Component Value Date   NA 140 02/10/2021   K 4.9 02/10/2021   CO2 23 02/10/2021   GLUCOSE 181 (H) 02/10/2021   BUN 26 02/10/2021   CREATININE 1.82 (H) 02/10/2021   BILITOT 0.2 02/10/2021   ALKPHOS 102 02/10/2021   AST 13 02/10/2021   ALT 15 02/10/2021   PROT 7.2 02/10/2021   ALBUMIN 4.0 02/10/2021   CALCIUM 9.4 02/10/2021   ANIONGAP 11 05/16/2020   EGFR 30 (L) 02/10/2021   Lab Results  Component Value Date   CHOL 186 02/10/2021   Lab Results  Component Value Date   HDL 35 (L) 02/10/2021   Lab Results  Component Value Date   LDLCALC 98 02/10/2021   Lab Results  Component Value Date   TRIG 312 (H) 02/10/2021   Lab Results  Component Value Date   CHOLHDL 5.3 (H) 02/10/2021   Lab Results  Component Value Date   HGBA1C 10.7 (H) 02/10/2021

## 2021-03-26 ENCOUNTER — Ambulatory Visit (HOSPITAL_COMMUNITY)
Admission: RE | Admit: 2021-03-26 | Discharge: 2021-03-26 | Disposition: A | Discharge status: Home / Self Care | Payer: Medicare HMO | Source: Ambulatory Visit | Attending: Urology | Admitting: Urology

## 2021-03-26 ENCOUNTER — Other Ambulatory Visit: Payer: Self-pay

## 2021-03-26 DIAGNOSIS — N2889 Other specified disorders of kidney and ureter: Secondary | ICD-10-CM | POA: Diagnosis not present

## 2021-03-26 DIAGNOSIS — I700 Atherosclerosis of aorta: Secondary | ICD-10-CM | POA: Diagnosis not present

## 2021-03-26 DIAGNOSIS — N281 Cyst of kidney, acquired: Secondary | ICD-10-CM | POA: Diagnosis not present

## 2021-03-26 DIAGNOSIS — K820 Obstruction of gallbladder: Secondary | ICD-10-CM | POA: Diagnosis not present

## 2021-03-29 ENCOUNTER — Telehealth: Payer: Self-pay | Admitting: Family Medicine

## 2021-03-29 NOTE — Telephone Encounter (Signed)
No samples available, patient aware 

## 2021-03-31 NOTE — Progress Notes (Signed)
Received notification from [employer]) regarding approval for [employer]. Patient assistance approved from 03/26/21 to 03/06/22.  MEDICATION DOES NOT AUTO REFILL. PT CAN CALL COMPANY AND REQUEST REFILLS USING THE RX INFO IN SHIPMENT.  Phone: [phone]

## 2021-04-06 DIAGNOSIS — N183 Chronic kidney disease, stage 3 unspecified: Secondary | ICD-10-CM

## 2021-04-06 DIAGNOSIS — Z794 Long term (current) use of insulin: Secondary | ICD-10-CM

## 2021-04-06 DIAGNOSIS — E1122 Type 2 diabetes mellitus with diabetic chronic kidney disease: Secondary | ICD-10-CM

## 2021-04-06 DIAGNOSIS — E1165 Type 2 diabetes mellitus with hyperglycemia: Secondary | ICD-10-CM

## 2021-04-07 ENCOUNTER — Other Ambulatory Visit: Payer: Self-pay | Admitting: Urology

## 2021-04-07 DIAGNOSIS — N2889 Other specified disorders of kidney and ureter: Secondary | ICD-10-CM

## 2021-04-13 DIAGNOSIS — E7800 Pure hypercholesterolemia, unspecified: Secondary | ICD-10-CM | POA: Diagnosis not present

## 2021-04-13 DIAGNOSIS — H5200 Hypermetropia, unspecified eye: Secondary | ICD-10-CM | POA: Diagnosis not present

## 2021-04-13 DIAGNOSIS — E119 Type 2 diabetes mellitus without complications: Secondary | ICD-10-CM | POA: Diagnosis not present

## 2021-04-13 DIAGNOSIS — Z0100 Encounter for examination of eyes and vision without abnormal findings: Secondary | ICD-10-CM | POA: Diagnosis not present

## 2021-04-13 DIAGNOSIS — H40023 Open angle with borderline findings, high risk, bilateral: Secondary | ICD-10-CM | POA: Diagnosis not present

## 2021-04-13 LAB — HM DIABETES EYE EXAM

## 2021-04-21 DIAGNOSIS — E1122 Type 2 diabetes mellitus with diabetic chronic kidney disease: Secondary | ICD-10-CM | POA: Diagnosis not present

## 2021-04-21 DIAGNOSIS — E559 Vitamin D deficiency, unspecified: Secondary | ICD-10-CM | POA: Diagnosis not present

## 2021-04-21 DIAGNOSIS — N2889 Other specified disorders of kidney and ureter: Secondary | ICD-10-CM | POA: Diagnosis not present

## 2021-04-21 DIAGNOSIS — I129 Hypertensive chronic kidney disease with stage 1 through stage 4 chronic kidney disease, or unspecified chronic kidney disease: Secondary | ICD-10-CM | POA: Diagnosis not present

## 2021-04-21 DIAGNOSIS — N189 Chronic kidney disease, unspecified: Secondary | ICD-10-CM | POA: Diagnosis not present

## 2021-04-27 ENCOUNTER — Telehealth: Payer: Medicare HMO

## 2021-04-28 DIAGNOSIS — E1129 Type 2 diabetes mellitus with other diabetic kidney complication: Secondary | ICD-10-CM | POA: Diagnosis not present

## 2021-04-28 DIAGNOSIS — N189 Chronic kidney disease, unspecified: Secondary | ICD-10-CM | POA: Diagnosis not present

## 2021-04-28 DIAGNOSIS — I129 Hypertensive chronic kidney disease with stage 1 through stage 4 chronic kidney disease, or unspecified chronic kidney disease: Secondary | ICD-10-CM | POA: Diagnosis not present

## 2021-04-28 DIAGNOSIS — R809 Proteinuria, unspecified: Secondary | ICD-10-CM | POA: Diagnosis not present

## 2021-04-28 DIAGNOSIS — E1122 Type 2 diabetes mellitus with diabetic chronic kidney disease: Secondary | ICD-10-CM | POA: Diagnosis not present

## 2021-04-28 DIAGNOSIS — E6609 Other obesity due to excess calories: Secondary | ICD-10-CM | POA: Diagnosis not present

## 2021-04-28 DIAGNOSIS — N19 Unspecified kidney failure: Secondary | ICD-10-CM | POA: Diagnosis not present

## 2021-05-04 DIAGNOSIS — N281 Cyst of kidney, acquired: Secondary | ICD-10-CM | POA: Diagnosis not present

## 2021-05-04 DIAGNOSIS — C641 Malignant neoplasm of right kidney, except renal pelvis: Secondary | ICD-10-CM | POA: Diagnosis not present

## 2021-05-04 DIAGNOSIS — N183 Chronic kidney disease, stage 3 unspecified: Secondary | ICD-10-CM | POA: Diagnosis not present

## 2021-05-10 ENCOUNTER — Other Ambulatory Visit: Payer: Self-pay | Admitting: Urology

## 2021-05-10 DIAGNOSIS — H25813 Combined forms of age-related cataract, bilateral: Secondary | ICD-10-CM | POA: Diagnosis not present

## 2021-05-10 DIAGNOSIS — H40023 Open angle with borderline findings, high risk, bilateral: Secondary | ICD-10-CM | POA: Diagnosis not present

## 2021-05-10 DIAGNOSIS — E119 Type 2 diabetes mellitus without complications: Secondary | ICD-10-CM | POA: Diagnosis not present

## 2021-05-11 ENCOUNTER — Other Ambulatory Visit: Payer: Self-pay | Admitting: Urology

## 2021-05-11 ENCOUNTER — Other Ambulatory Visit (HOSPITAL_COMMUNITY): Payer: Self-pay | Admitting: Urology

## 2021-05-11 DIAGNOSIS — N2889 Other specified disorders of kidney and ureter: Secondary | ICD-10-CM

## 2021-05-12 ENCOUNTER — Encounter: Payer: Self-pay | Admitting: Family Medicine

## 2021-05-12 ENCOUNTER — Ambulatory Visit (INDEPENDENT_AMBULATORY_CARE_PROVIDER_SITE_OTHER): Payer: Medicare HMO | Admitting: Family Medicine

## 2021-05-12 VITALS — BP 119/76 | HR 75 | Temp 97.3°F | Ht 65.5 in | Wt 206.0 lb

## 2021-05-12 DIAGNOSIS — E1165 Type 2 diabetes mellitus with hyperglycemia: Secondary | ICD-10-CM | POA: Diagnosis not present

## 2021-05-12 DIAGNOSIS — E785 Hyperlipidemia, unspecified: Secondary | ICD-10-CM

## 2021-05-12 DIAGNOSIS — Z794 Long term (current) use of insulin: Secondary | ICD-10-CM

## 2021-05-12 DIAGNOSIS — G479 Sleep disorder, unspecified: Secondary | ICD-10-CM

## 2021-05-12 DIAGNOSIS — E1169 Type 2 diabetes mellitus with other specified complication: Secondary | ICD-10-CM | POA: Diagnosis not present

## 2021-05-12 DIAGNOSIS — E1159 Type 2 diabetes mellitus with other circulatory complications: Secondary | ICD-10-CM | POA: Diagnosis not present

## 2021-05-12 DIAGNOSIS — I152 Hypertension secondary to endocrine disorders: Secondary | ICD-10-CM

## 2021-05-12 DIAGNOSIS — E669 Obesity, unspecified: Secondary | ICD-10-CM | POA: Diagnosis not present

## 2021-05-12 LAB — BAYER DCA HB A1C WAIVED: HB A1C (BAYER DCA - WAIVED): 10 % — ABNORMAL HIGH (ref 4.8–5.6)

## 2021-05-12 MED ORDER — FREESTYLE LIBRE 2 READER DEVI: 1.0000 | Freq: Once | 0 refills | Status: AC

## 2021-05-12 MED ORDER — TRULICITY 4.5 MG/0.5ML ~~LOC~~ SOAJ: 4.5000 mg | SUBCUTANEOUS | 1 refills | Status: DC

## 2021-05-12 MED ORDER — FREESTYLE LIBRE 2 SENSOR MISC: 1.0000 | 5 refills | Status: DC

## 2021-05-12 MED ORDER — AMLODIPINE BESYLATE 10 MG PO TABS: 10.0000 mg | ORAL_TABLET | Freq: Every day | ORAL | 1 refills | Status: DC

## 2021-05-12 MED ORDER — EMPAGLIFLOZIN 25 MG PO TABS: 25.0000 mg | ORAL_TABLET | Freq: Every day | ORAL | 1 refills | Status: DC

## 2021-05-12 MED ORDER — ATENOLOL 100 MG PO TABS: 100.0000 mg | ORAL_TABLET | Freq: Every morning | ORAL | 1 refills | Status: DC

## 2021-05-12 MED ORDER — INSULIN LISPRO (1 UNIT DIAL) 100 UNIT/ML (KWIKPEN)
5.0000 [IU] | PEN_INJECTOR | Freq: Three times a day (TID) | SUBCUTANEOUS | 11 refills | Status: DC
Start: 2021-05-12 — End: 2021-06-09

## 2021-05-12 MED ORDER — DOXEPIN HCL 10 MG PO CAPS: 10.0000 mg | ORAL_CAPSULE | Freq: Every day | ORAL | 2 refills | Status: DC

## 2021-05-12 MED ORDER — LOVASTATIN 40 MG PO TABS: 60.0000 mg | ORAL_TABLET | Freq: Every day | ORAL | 1 refills | Status: DC

## 2021-05-12 NOTE — Progress Notes (Signed)
Assessment & Plan:  1. Type 2 diabetes mellitus with hyperglycemia, with long-term current use of insulin (HCC) Lab Results  Component Value Date   HGBA1C 10.0 (H) 05/12/2021   HGBA1C 10.7 (H) 02/10/2021   HGBA1C 9.9 (H) 11/05/2020    - Diabetes is not at goal of A1c < 7. - Medications: continue current medications; add meal-time insulin 5 units TID with meals. Sample of Fiasp provided in the office. Humalog prescription sent to [facility] to get assistance. - Home glucose monitoring: Freestyle libre order to be placed via [employer]. - Patient is currently taking a statin. Patient is taking an ACE-inhibitor/ARB.  - Instruction/counseling given: reminded to bring blood glucose meter & log to each visit, discussed foot care, and discussed the need for weight loss  Diabetes Health Maintenance Due  Topic Date Due   OPHTHALMOLOGY EXAM  Never done   HEMOGLOBIN A1C  11/12/2021   FOOT EXAM  05/13/2022    Lab Results  Component Value Date   LABMICR See below: 03/05/2021   LABMICR 214.4 04/14/2020   - Bayer DCA Hb A1c Waived - lovastatin (MEVACOR) 40 MG tablet; Take 1.5 tablets (60 mg total) by mouth at bedtime.  Dispense: 135 tablet; Refill: 1 - Dulaglutide (TRULICITY) 4.5 XM/4.6OE SOPN; Inject 4.5 mg as directed once a week.  Dispense: 6 mL; Refill: 1 - empagliflozin (JARDIANCE) 25 MG TABS tablet; Take 1 tablet (25 mg total) by mouth daily before breakfast.  Dispense: 90 tablet; Refill: 1 - Microalbumin / creatinine urine ratio - insulin lispro (HUMALOG KWIKPEN) 100 UNIT/ML KwikPen; Inject 5-10 Units into the skin 3 (three) times daily with meals.  Dispense: 45 mL; Refill: 11  2. Difficulty sleeping Improving and patient hasn't started medication yet. - doxepin (SINEQUAN) 10 MG capsule; Take 1 capsule (10 mg total) by mouth at bedtime.  Dispense: 30 capsule; Refill: 2  3. Hypertension associated with diabetes ([employer]) Well controlled on current regimen.  - CBC with  Differential/Platelet - CMP14+EGFR - amLODipine (NORVASC) 10 MG tablet; Take 1 tablet (10 mg total) by mouth daily.  Dispense: 90 tablet; Refill: 1 - atenolol (TENORMIN) 100 MG tablet; Take 1 tablet (100 mg total) by mouth every morning.  Dispense: 90 tablet; Refill: 1  4. Hyperlipidemia associated with type 2 diabetes mellitus ([place]) Well controlled on current regimen.  - Lipid panel - lovastatin (MEVACOR) 40 MG tablet; Take 1.5 tablets (60 mg total) by mouth at bedtime.  Dispense: 135 tablet; Refill: 1  5. Obesity (BMI 30-39.9) Encouraged healthy eating and exercise. - Dulaglutide (TRULICITY) 4.5 HO/1.2YQ SOPN; Inject 4.5 mg as directed once a week.  Dispense: 6 mL; Refill: 1   Return in about 2 weeks (around 05/26/2021) for DM with Almyra Free (11:30) and 3 month with me for DM.  [name], MSN, APRN, FNP-C [facility]  Subjective:    Patient ID: Brooke [name], female    DOB: 04/05/1955, 66 y.o.   MRN: [id number]  Patient Care Team: [name], FNP as PCP - General (Family Medicine) Lavera Guise, [facility] (Pharmacist) Celestia Khat, [place] (Optometry)   Chief Complaint:  Chief Complaint  Patient presents with   Diabetes   Insomnia    7 week follow up     HPI: Brooke [name] is a 66 y.o. female presenting on 05/12/2021 for Diabetes and Insomnia (7 week follow up )  Diabetes: Patient presents for follow up of diabetes. Current symptoms include: hyperglycemia. Known diabetic complications: nephropathy and cardiovascular disease. Medication compliance:  Yes, she is on Jardiance 25 mg, Trulicity 4.5 mg, and Basaglar 35 units QHS, stopped metformin due to kidney function. Current diet: in general, an "unhealthy" diet, but has made attempts to reduce her sugar intake .  Current exercise: walking. Home blood sugar records:  rarely over 200 . Is she  on ACE inhibitor or angiotensin II receptor blocker? Yes (Lisinopril). Is she on a statin? Yes (Lovastatin). She  is in need of quick lowering of her A1c in order to undergo a partial vs radial nephrectomy on 08/04/2021 due to kidney cancer.  Insomnia: she was started on Doxepin at our last visit to help with sleep which she reports she never picked up the prescription because she didn't have the money. She has previously failed treatment with Trazodone and Benadryl. She has been eating pineapple and pistachios and rubbing down with melatonin lotion.   New complaints:  None   Social history:  Relevant past medical, surgical, family and social history reviewed and updated as indicated. Interim medical history since our last visit reviewed.  Allergies and medications reviewed and updated.  DATA REVIEWED: CHART IN EPIC  ROS: Negative unless specifically indicated above in HPI.    Current Outpatient Medications:    albuterol (VENTOLIN HFA) 108 (90 Base) MCG/ACT inhaler, Inhale 2 puffs into the lungs every 6 (six) hours as needed for wheezing or shortness of breath., Disp: 18 g, Rfl: 2   amLODipine (NORVASC) 10 MG tablet, Take 1 tablet (10 mg total) by mouth daily., Disp: 90 tablet, Rfl: 1   atenolol (TENORMIN) 100 MG tablet, TAKE 1 TABLET BY MOUTH IN THE MORNING, Disp: 90 tablet, Rfl: 0   Cholecalciferol 25 MCG (1000 UT) capsule, Take by mouth., Disp: , Rfl:    doxepin (SINEQUAN) 10 MG capsule, Take 1 capsule (10 mg total) by mouth at bedtime., Disp: 30 capsule, Rfl: 2   Dulaglutide (TRULICITY) 4.5 FH/5.4TG SOPN, Inject 4.5 mg as directed once a week., Disp: 6 mL, Rfl: 1   empagliflozin (JARDIANCE) 25 MG TABS tablet, Take 1 tablet (25 mg total) by mouth daily before breakfast., Disp: 90 tablet, Rfl: 1   Finerenone (KERENDIA) 10 MG TABS, Take 10 mg by mouth daily., Disp: 90 tablet, Rfl: 1   hydrochlorothiazide (HYDRODIURIL) 25 MG tablet, Take 1 tablet (25 mg total) by mouth daily., Disp: 90 tablet, Rfl: 1   Insulin Glargine (BASAGLAR KWIKPEN) 100 UNIT/ML, Inject 35 Units into the skin at bedtime., Disp: ,  Rfl:    Insulin Pen Needle 32G X 6 MM MISC, Use to inject insulin once daily. DX:E11.65, Disp: 100 each, Rfl: 11   lisinopril (ZESTRIL) 10 MG tablet, Take 1 tablet (10 mg total) by mouth daily., Disp: 90 tablet, Rfl: 1   lovastatin (MEVACOR) 40 MG tablet, Take 1.5 tablets (60 mg total) by mouth at bedtime., Disp: 135 tablet, Rfl: 1   SYMBICORT 160-4.5 MCG/ACT inhaler, INHALE 2 PUFFS INTO THE LUNGS 2 (TWO) TIMES DAILY., Disp: 3 each, Rfl: 1   No Known Allergies Past Medical History:  Diagnosis Date   Bronchitis    frequent bronchitis   Diabetes mellitus    Diabetic nephropathy (HCC)    Hepatic steatosis    Hypertension    Hyperuricemia 11/27/2020   Mass of right kidney    Obesity (BMI 35.0-39.9 without comorbidity)    Vertigo    Vitamin D insufficiency 11/27/2020    Past Surgical History:  Procedure Laterality Date   BREAST SURGERY Right 1974    Social History  Socioeconomic History   Marital status: Married    Spouse name: Donnie   Number of children: 1   Years of education: Not on file   Highest education level: Not on file  Occupational History   Not on file  Tobacco Use   Smoking status: Never   Smokeless tobacco: Never  Vaping Use   Vaping Use: Never used  Substance and Sexual Activity   Alcohol use: No   Drug use: No   Sexual activity: Never  Other Topics Concern   Not on file  Social History Narrative   1 daughter    1 grandson   Social Determinants of Radio broadcast assistant Strain: Medium Risk   Difficulty of Paying Living Expenses: Somewhat hard  Food Insecurity: Landscape architect Present   Worried About Charity fundraiser in the Last Year: Sometimes true   Arboriculturist in the Last Year: Sometimes true  Transportation Needs: No Transportation Needs   Lack of Transportation (Medical): No   Lack of Transportation (Non-Medical): No  Physical Activity: Inactive   Days of Exercise per Week: 0 days   Minutes of Exercise per Session: 0 min   Stress: No Stress Concern Present   Feeling of Stress : Only a little  Social Connections: Engineer, building services of Communication with Friends and Family: More than three times a week   Frequency of Social Gatherings with Friends and Family: More than three times a week   Attends Religious Services: More than 4 times per year   Active Member of [employer] or Organizations: Yes   Attends Music therapist: More than 4 times per year   Marital Status: Married  Human resources officer Violence: Not At Risk   Fear of Current or Ex-Partner: No   Emotionally Abused: No   Physically Abused: No   Sexually Abused: No        Objective:    BP 119/76    Pulse 75    Temp (!) 97.3 F (36.3 C) (Temporal)    Ht 5' 5.5" (1.664 m)    Wt 206 lb (93.4 kg)    LMP 03/16/2012    SpO2 97%    BMI 33.76 kg/m   Wt Readings from Last 3 Encounters:  05/12/21 206 lb (93.4 kg)  03/24/21 204 lb 9.6 oz (92.8 kg)  02/10/21 198 lb 12.8 oz (90.2 kg)    Physical Exam Vitals reviewed.  Constitutional:      General: She is not in acute distress.    Appearance: Normal appearance. She is obese. She is not ill-appearing, toxic-appearing or diaphoretic.  HENT:     Head: Normocephalic and atraumatic.  Eyes:     General: No scleral icterus.       Right eye: No discharge.        Left eye: No discharge.     Conjunctiva/sclera: Conjunctivae normal.  Cardiovascular:     Rate and Rhythm: Normal rate and regular rhythm.     Heart sounds: Normal heart sounds. No murmur heard.   No friction rub. No gallop.  Pulmonary:     Effort: Pulmonary effort is normal. No respiratory distress.     Breath sounds: Normal breath sounds. No stridor. No wheezing, rhonchi or rales.  Musculoskeletal:        General: Normal range of motion.     Cervical back: Normal range of motion.  Skin:    General: Skin is warm and dry.  Capillary Refill: Capillary refill takes less than 2 seconds.  Neurological:     General: No  focal deficit present.     Mental Status: She is alert and oriented to person, place, and time. Mental status is at baseline.  Psychiatric:        Mood and Affect: Mood normal.        Behavior: Behavior normal.        Thought Content: Thought content normal.        Judgment: Judgment normal.   Diabetic Foot Exam - Simple   Simple Foot Form Diabetic Foot exam was performed with the following findings: Yes 05/12/2021  3:52 PM  Visual Inspection No deformities, no ulcerations, no other skin breakdown bilaterally: Yes Sensation Testing Intact to touch and monofilament testing bilaterally: Yes Pulse Check Posterior Tibialis and Dorsalis pulse intact bilaterally: Yes Comments     Lab Results  Component Value Date   TSH 1.880 03/13/2019   Lab Results  Component Value Date   WBC 9.5 02/10/2021   HGB 12.8 02/10/2021   HCT 39.2 02/10/2021   MCV 82 02/10/2021   PLT 411 02/10/2021   Lab Results  Component Value Date   NA 140 02/10/2021   K 4.9 02/10/2021   CO2 23 02/10/2021   GLUCOSE 181 (H) 02/10/2021   BUN 26 02/10/2021   CREATININE 1.82 (H) 02/10/2021   BILITOT 0.2 02/10/2021   ALKPHOS 102 02/10/2021   AST 13 02/10/2021   ALT 15 02/10/2021   PROT 7.2 02/10/2021   ALBUMIN 4.0 02/10/2021   CALCIUM 9.4 02/10/2021   ANIONGAP 11 05/16/2020   EGFR 30 (L) 02/10/2021   Lab Results  Component Value Date   CHOL 186 02/10/2021   Lab Results  Component Value Date   HDL 35 (L) 02/10/2021   Lab Results  Component Value Date   LDLCALC 98 02/10/2021   Lab Results  Component Value Date   TRIG 312 (H) 02/10/2021   Lab Results  Component Value Date   CHOLHDL 5.3 (H) 02/10/2021   Lab Results  Component Value Date   HGBA1C 10.7 (H) 02/10/2021

## 2021-05-12 NOTE — Patient Instructions (Signed)
Meal time insulin 5 units. Do not use if you are not going to eat.  ?

## 2021-05-13 ENCOUNTER — Telehealth: Payer: Self-pay | Admitting: Pharmacist

## 2021-05-13 LAB — CBC WITH DIFFERENTIAL/PLATELET
Basophils Absolute: 0.1 10*3/uL (ref 0.0–0.2)
Basos: 1 %
EOS (ABSOLUTE): 0.6 10*3/uL — ABNORMAL HIGH (ref 0.0–0.4)
Eos: 7 %
Hematocrit: 38.1 % (ref 34.0–46.6)
Hemoglobin: 12.4 g/dL (ref 11.1–15.9)
Immature Grans (Abs): 0 10*3/uL (ref 0.0–0.1)
Immature Granulocytes: 0 %
Lymphocytes Absolute: 3.5 10*3/uL — ABNORMAL HIGH (ref 0.7–3.1)
Lymphs: 39 %
MCH: 26.7 pg (ref 26.6–33.0)
MCHC: 32.5 g/dL (ref 31.5–35.7)
MCV: 82 fL (ref 79–97)
Monocytes Absolute: 0.5 10*3/uL (ref 0.1–0.9)
Monocytes: 6 %
Neutrophils Absolute: 4.2 10*3/uL (ref 1.4–7.0)
Neutrophils: 47 %
Platelets: 395 10*3/uL (ref 150–450)
RBC: 4.64 x10E6/uL (ref 3.77–5.28)
RDW: 14.4 % (ref 11.7–15.4)
WBC: 9 10*3/uL (ref 3.4–10.8)

## 2021-05-13 LAB — CMP14+EGFR
ALT: 18 IU/L (ref 0–32)
AST: 14 IU/L (ref 0–40)
Albumin/Globulin Ratio: 1.3 (ref 1.2–2.2)
Albumin: 4 g/dL (ref 3.8–4.8)
Alkaline Phosphatase: 93 IU/L (ref 44–121)
BUN/Creatinine Ratio: 11 — ABNORMAL LOW (ref 12–28)
BUN: 20 mg/dL (ref 8–27)
Bilirubin Total: 0.3 mg/dL (ref 0.0–1.2)
CO2: 22 mmol/L (ref 20–29)
Calcium: 9.3 mg/dL (ref 8.7–10.3)
Chloride: 107 mmol/L — ABNORMAL HIGH (ref 96–106)
Creatinine, Ser: 1.76 mg/dL — ABNORMAL HIGH (ref 0.57–1.00)
Globulin, Total: 3.1 g/dL (ref 1.5–4.5)
Glucose: 84 mg/dL (ref 70–99)
Potassium: 4.6 mmol/L (ref 3.5–5.2)
Sodium: 142 mmol/L (ref 134–144)
Total Protein: 7.1 g/dL (ref 6.0–8.5)
eGFR: 31 mL/min/{1.73_m2} — ABNORMAL LOW (ref >59)

## 2021-05-13 LAB — LIPID PANEL
Chol/HDL Ratio: 4.3 ratio (ref 0.0–4.4)
Cholesterol, Total: 187 mg/dL (ref 100–199)
HDL: 43 mg/dL (ref >39)
LDL Chol Calc (NIH): 93 mg/dL (ref 0–99)
Triglycerides: 304 mg/dL — ABNORMAL HIGH (ref 0–149)
VLDL Cholesterol Cal: 51 mg/dL — ABNORMAL HIGH (ref 5–40)

## 2021-05-13 LAB — MICROALBUMIN / CREATININE URINE RATIO
Creatinine, Urine: 72.5 mg/dL
Microalb/Creat Ratio: 33 mg/g creat — ABNORMAL HIGH (ref 0–29)
Microalbumin, Urine: 24.2 ug/mL

## 2021-05-13 NOTE — Telephone Encounter (Signed)
Brooke [name], patient is unable to get the [facility] to download on her phone.  She wants to know what she should do.  Does not know how it will work now.  Would you call when you get a moment? ?

## 2021-05-13 NOTE — Telephone Encounter (Signed)
Please let patient know I have ordered her the reader that comes with libre sensors ?I'm not sure that her phone is compatible with [employer] app ?Everything will ship here and we will call her when it arrives ?Please continue to finger stick for blood sugar readings ? ?

## 2021-05-13 NOTE — Telephone Encounter (Signed)
LIBRE 2 CGM ORDERED VIA [facility] PORT[employer] USING [facility] ?

## 2021-05-13 NOTE — Telephone Encounter (Signed)
Please let patient know I have worked her into my schedule for  ?IN PERSON appt on 3/28 at 10:30am ?Please bring blood sugar logs to appt ?

## 2021-05-13 NOTE — Telephone Encounter (Signed)
Patient notified of appointment and will be here. ?

## 2021-05-13 NOTE — Telephone Encounter (Signed)
Pt aware.

## 2021-05-17 NOTE — Progress Notes (Signed)
Order has been sent

## 2021-05-21 ENCOUNTER — Telehealth: Payer: Medicare HMO

## 2021-05-26 NOTE — Progress Notes (Signed)
Received notification from [facility] CARES regarding approval for [facility] & TRULICITY 4.5. Patient assistance approved from 04/21/21 to 03/06/22.  TRULICITY DELIVERED TO PT 04/29/21 NEXT BASAGLAR SHIPMENT GOES OUT 07/2021  SHIPS FROM [facility].

## 2021-06-01 ENCOUNTER — Ambulatory Visit (INDEPENDENT_AMBULATORY_CARE_PROVIDER_SITE_OTHER): Payer: Medicare HMO | Admitting: Pharmacist

## 2021-06-01 DIAGNOSIS — N183 Chronic kidney disease, stage 3 unspecified: Secondary | ICD-10-CM

## 2021-06-01 DIAGNOSIS — E1165 Type 2 diabetes mellitus with hyperglycemia: Secondary | ICD-10-CM

## 2021-06-02 NOTE — Progress Notes (Signed)
? ? ?Chronic Care Management ?Pharmacy Note ? ?06/01/2021 ?Name:  Brooke [name] MRN:  [id number] DOB:  01-10-1956 ? ?Summary: t2dm, ckd ? ?Recommendations/Changes made from today's visit: ? ?Diabetes: ?Uncontrolled a1c 10.9% (has increased); current treatment: BASAGLAR TO 27-78 UNITS, TRULICITY 2.4MP, JARDIANCE 55m daily; humalog added for meal times (10-20 units) ?PAST meds: Metformin was discontinued due to GFR 28-->new start KCarrington Clamp(pt sees nephro next week, repeat labs were within normal limits)--trying to get, too expensive ?Continues to work on controlling her diabetes, but reports multiple health stressors ?Denies personal and family history of Medullary thyroid cancer (MTC) ?Current glucose readings: fasting glucose: <200, post prandial glucose: having higher post prandials in PM (late night snacking--working on) ?Patient can't afford LElenor LegatoCGM--$75 based on 20% copay humana DME ?Will sample libre 2/loan reader ?Denies hypoglycemic/hyperglycemic symptoms ?Discussed meal planning options and Plate method for healthy eating ?Avoid sugary drinks and desserts ?Incorporate balanced protein, non starchy veggies, 1 serving of carbohydrate with each meal ?Increase water intake ?Increase physical activity as able ?Current exercise: N/A ?[place] BG READINGS; [place]?Assessed patient finances. Enrolled in  the lilly cares patient assistance for basaglar/trulicity/humalog; enrollment sent for BI cares (jardiance); and AZ&me (symbicort) patient assistance; will continue to work [place] follow up ? ?Subjective: ?Brooke Brameis an 66y.o. year old female who is a primary patient of JLoman Brooklyn FNP.  The CCM team was consulted for assistance with disease management and care coordination needs.   ? ?Engaged with patient by telephone for follow up visit in response to provider referral for pharmacy case management and/or care coordination services.  ? ?Consent to  Services:  ?The patient was given information about Chronic Care Management services, agreed to services, and gave verbal consent prior to initiation of services.  Please see initial visit note for detailed documentation.  ? ?Patient Care Team: ?JLoman Brooklyn FNP as PCP - General (Family Medicine) ?[facility](Pharmacist) ?JCelestia Khat OD (Optometry) ? ?Objective: ? ?Lab Results  ?Component Value Date  ? CREATININE 1.76 (H) 05/12/2021  ? CREATININE 1.82 (H) 02/10/2021  ? CREATININE 1.92 (H) 12/10/2020  ? ? ?Lab Results  ?Component Value Date  ? HGBA1C 10.0 (H) 05/12/2021  ? ?Last diabetic Eye exam:  ?Lab Results  ?Component Value Date/Time  ? HMDIABEYEEXA No Retinopathy 04/13/2021 12:00 AM  ?  ?Last diabetic Foot exam: No results found for: HMDIABFOOTEX  ? ?   ?Component Value Date/Time  ? CHOL 187 05/12/2021 1527  ? TRIG 304 (H) 05/12/2021 1527  ? HDL 43 05/12/2021 1527  ? CHOLHDL 4.3 05/12/2021 1527  ? L[place]93 05/12/2021 1527  ? ? ? ?  Latest Ref Rng & Units 05/12/2021  ?  3:27 PM 02/10/2021  ? 10:29 AM 11/05/2020  ?  8:39 AM  ?Hepatic Function  ?Total Protein 6.0 - 8.5 g/dL 7.1   7.2   6.9    ?Albumin 3.8 - 4.8 g/dL 4.0   4.0   3.9    ?AST 0 - 40 IU/L _0 ?ALT 0 - 32 IU/L _1 ?Alk Phosphatase 44 - 121 IU/L 93   102   87    ?Total Bilirubin 0.0 - 1.2 mg/dL 0.3   0.2   0.3    ? ? ?Lab Results  ?Component Value Date/Time  ? TSH 1.880 03/13/2019 09:55 AM  ? ? ? ?  Latest Ref Rng & Units 05/12/2021  ?  3:27 PM 02/10/2021  ? 10:29 AM 11/05/2020  ?  8:39 AM  ?CBC  ?WBC 3.4 - 10.8 x10E3/uL 9.0   9.5   8.4    ?Hemoglobin 11.1 - 15.9 g/dL 12.4   12.8   12.9    ?Hematocrit 34.0 - 46.6 % 38.1   39.2   39.6    ?Platelets 150 - 450 x10E3/uL 395   411   411    ? ? ?No results found for: VD25OH ? ?Clinical ASCVD: No  ?The 10-year ASCVD risk score (Arnett DK, et al., 2019) is: 19% ?  Values used to calculate the score: ?    Age: 66 years ?    Sex: Female ?    Is Non-Hispanic African American:  Yes ?    Diabetic: Yes ?    Tobacco smoker: No ?    Systolic Blood Pressure: 409 mmHg ?    Is BP treated: Yes ?    HDL Cholesterol: 43 mg/dL ?    Total Cholesterol: 187 mg/dL   ? ?Other: (CHADS2VASc if Afib, PHQ9 if depression, MMRC or CAT for COPD, ACT, DEXA) ? ?Social History  ? ?Tobacco Use  ?Smoking Status Never  ?Smokeless Tobacco Never  ? ?BP Readings from Last 3 Encounters:  ?05/12/21 119/76  ?03/24/21 128/73  ?02/10/21 125/76  ? ?Pulse Readings from Last 3 Encounters:  ?05/12/21 75  ?03/24/21 72  ?02/10/21 73  ? ?Wt Readings from Last 3 Encounters:  ?05/12/21 206 lb (93.4 kg)  ?03/24/21 204 lb 9.6 oz (92.8 kg)  ?02/10/21 198 lb 12.8 oz (90.2 kg)  ? ? ?Assessment: Review of patient past medical history, allergies, medications, health status, including review of consultants reports, laboratory and other test data, was performed as part of comprehensive evaluation and provision of chronic care management services.  ? ?SDOH:  (Social Determinants of Health) assessments and interventions performed:  ? ? ?CCM Care Plan ? ?No Known Allergies ? ?Medications Reviewed Today   ? ? Reviewed by [name], FNP (Family Nurse Practitioner) on 05/12/21 at 1715  Med List Status: <None>  ? ?Medication Order Taking? Sig Documenting Provider Last Dose Status Informant  ?albuterol (VENTOLIN HFA) 108 (90 Base) MCG/ACT inhaler [id number] Yes Inhale 2 puffs into the lungs every 6 (six) hours as needed for wheezing or shortness of breath. [name], FNP Taking Active Self  ?amLODipine (NORVASC) 10 MG tablet [id number]  Take 1 tablet (10 mg total) by mouth daily. [name], FNP  Active   ?atenolol (TENORMIN) 100 MG tablet [id number]  Take 1 tablet (100 mg total) by mouth every morning. [name], FNP  Active   ?Cholecalciferol 25 MCG (1000 UT) capsule [id number] Yes Take by mouth. [provider] Taking Active   ?Continuous Blood Gluc Receiver (FREESTYLE LIBRE 2 READER) DEVI [id number] Yes 1 Device by  Does not apply route once for 1 dose. [name], FNP  Active   ?Continuous Blood Gluc Sensor (FREESTYLE LIBRE 2 SENSOR) MISC [id number] Yes 1 each by Does not apply route every 14 (fourteen) days. [name], FNP  Active   ?doxepin (SINEQUAN) 10 MG capsule [id number]  Take 1 capsule (10 mg total) by mouth at bedtime. [name], FNP  Active   ?Dulaglutide (TRULICITY) 4.5 JO/8.7OM SOPN [id number]  Inject 4.5 mg as directed once a week. [name] F, FNP  Active   ?empagliflozin (JARDIANCE) 25 MG TABS tablet [id number]  Take 1 tablet (25 mg total) by mouth daily before breakfast. [name], FNP  Active   ?Finerenone (KERENDIA) 10 MG TABS [id number] Yes Take 10 mg by mouth daily. [name], FNP Taking Active   ?hydrochlorothiazide (HYDRODIURIL) 25 MG tablet [id number] Yes Take 1 tablet (25 mg total) by mouth daily. [name], FNP Taking Active   ?Insulin Glargine (BASAGLAR KWIKPEN) 100 UNIT/ML [id number] Yes Inject 35 Units into the skin at bedtime. [provider] Taking Active   ?         ?Med Note Parthenia Ames Dec 31, 2020 12:05 PM) Via lilly cares patient assistance program  ?insulin lispro (HUMALOG KWIKPEN) 100 UNIT/ML KwikPen [id number] Yes Inject 5-10 Units into the skin 3 (three) times daily with meals. [name], FNP  Active   ?Insulin Pen Needle 32G X 6 MM MISC [id number] Yes Use to inject insulin once daily. DX:E11.65 [name], FNP Taking Active   ?lisinopril (ZESTRIL) 10 MG tablet [id number] Yes Take 1 tablet (10 mg total) by mouth daily. [name], FNP Taking Active   ?lovastatin (MEVACOR) 40 MG tablet [id number]  Take 1.5 tablets (60 mg total) by mouth at bedtime. [name], FNP  Active   ?SYMBICORT 160-4.5 MCG/ACT inhaler [id number] Yes INHALE 2 PUFFS INTO THE LUNGS 2 (TWO) TIMES DAILY. [name], FNP Taking Active   ?         ?Med Note Lavera Guise   Mon Mar 22, 2021  9:56 AM) Via az&me patient assistance  ? ?  ?   ? ?  ? ? ?Patient Active Problem List  ? Diagnosis Date Noted  ? Thickened endometrium 03/24/2021  ? Vaginal bleeding 03/24/2021  ? Difficulty sleeping 02/10/2021  ? Mass of right kidney 02/10/2021  ? Positiv

## 2021-06-04 DIAGNOSIS — Z794 Long term (current) use of insulin: Secondary | ICD-10-CM

## 2021-06-04 DIAGNOSIS — N183 Chronic kidney disease, stage 3 unspecified: Secondary | ICD-10-CM

## 2021-06-04 DIAGNOSIS — E1122 Type 2 diabetes mellitus with diabetic chronic kidney disease: Secondary | ICD-10-CM

## 2021-06-04 DIAGNOSIS — E1165 Type 2 diabetes mellitus with hyperglycemia: Secondary | ICD-10-CM

## 2021-06-09 ENCOUNTER — Other Ambulatory Visit: Payer: Self-pay | Admitting: Family Medicine

## 2021-06-09 ENCOUNTER — Telehealth: Payer: Self-pay | Admitting: Family Medicine

## 2021-06-09 ENCOUNTER — Ambulatory Visit (INDEPENDENT_AMBULATORY_CARE_PROVIDER_SITE_OTHER): Payer: Medicare HMO | Admitting: *Deleted

## 2021-06-09 DIAGNOSIS — Z794 Long term (current) use of insulin: Secondary | ICD-10-CM

## 2021-06-09 DIAGNOSIS — E1165 Type 2 diabetes mellitus with hyperglycemia: Secondary | ICD-10-CM

## 2021-06-09 MED ORDER — INSULIN LISPRO (1 UNIT DIAL) 100 UNIT/ML (KWIKPEN): 10.0000 [IU] | PEN_INJECTOR | Freq: Three times a day (TID) | SUBCUTANEOUS | 0 refills | Status: DC

## 2021-06-09 NOTE — Patient Instructions (Signed)
Visit Information ? ?Following are the goals we discussed today:  ?Current Barriers:  ?Unable to independently afford treatment regimen ?Unable to achieve control of T2DM  ? ?Pharmacist Clinical Goal(s):  ?Over the next 90 days, patient will verbalize ability to afford treatment regimen ?maintain control of T2DM as evidenced by GOAL A1C<7%  through collaboration with PharmD and provider.  ? ? ?Interventions: ?1:1 collaboration with [name], FNP regarding development and update of comprehensive plan of care as evidenced by provider attestation and co-signature ?Inter-disciplinary care team collaboration (see longitudinal plan of care) ?Comprehensive medication review performed; medication list updated in electronic medical record ? ?Diabetes: ?Uncontrolled a1c 10.9% (has increased); current treatment: BASAGLAR TO 30 UNITS, TRULICITY 4.'5MG'$ , JARDIANCE '25mg'$  daily; humalog added for meal times (10-20 units) ?PAST meds: Metformin was discontinued due to GFR 28-->new start Carrington Clamp (pt sees nephro next week, repeat labs were within normal limits)--trying to get, too expensive ?Continues to work on controlling her diabetes, but reports multiple health stressors ?Denies personal and family history of Medullary thyroid cancer (MTC) ?Current glucose readings: fasting glucose: <200, post prandial glucose: having higher post prandials in PM (late night snacking--working on) ?Patient can't afford Elenor Legato CGM--$75 based on 20% copay humana DME ?Will sample libre 2/loan reader ?Denies hypoglycemic/hyperglycemic symptoms ?Discussed meal planning options and Plate method for healthy eating ?Avoid sugary drinks and desserts ?Incorporate balanced protein, non starchy veggies, 1 serving of carbohydrate with each meal ?Increase water intake ?Increase physical activity as able ?Current exercise: N/A ?Educated on [place], BG READINGS; new start [place] ?Assessed patient finances. Enrolled in  the lilly cares patient  assistance for basaglar/trulicity/humalog; enrollment sent for BI cares (jardiance); and AZ&me (symbicort) patient assistance; will continue to work on [place] at follow up ? ? ?Patient Goals/Self-Care Activities ?Over the next 90 days, patient will:  ?- take medications as prescribed ?check glucose DAILY, document, and provide at future appointments ? ?Follow Up Plan: Telephone follow up appointment with care management team member scheduled for: 3 months ? ? ?Plan: Telephone follow up appointment with care management team member scheduled for:  2 weeks ? ?Signature ?Regina Eck, PharmD, BCPS ?Clinical Pharmacist, [facility] ?[place] [address]  [address] Phone [phone] ? ? ?Please call the care guide team at [phone] if you need to cancel or reschedule your appointment.  ? ?The patient verbalized understanding of instructions, educational materials, and care plan provided today and declined offer to receive copy of patient instructions, educational materials, and care plan.  ? ?

## 2021-06-09 NOTE — Telephone Encounter (Signed)
Per Almyra Free pt needs the [place] reader 2 "loaner" and set up with a libre 2 sample. I have called patient to schedule appointment for her. Please schedule patient with triage nurse for placement and setup.  ?

## 2021-06-09 NOTE — Progress Notes (Signed)
Libre 2 placed on Left Arm, Patient tolerated well and educated on how to obtain readings. Patient was given Office Reader to return. Doloris Hall, LPN  ?

## 2021-06-09 NOTE — Telephone Encounter (Signed)
Appointment given for today at 2:30pm ?

## 2021-06-10 ENCOUNTER — Telehealth: Payer: Self-pay | Admitting: Family Medicine

## 2021-06-10 NOTE — Telephone Encounter (Signed)
Contacted patient. She will come in sometime this afternoon. We will place a new libre 2 sensor ?

## 2021-06-17 ENCOUNTER — Ambulatory Visit (INDEPENDENT_AMBULATORY_CARE_PROVIDER_SITE_OTHER): Payer: Medicare HMO | Admitting: Pharmacist

## 2021-06-17 DIAGNOSIS — N183 Type 2 diabetes mellitus with diabetic chronic kidney disease: Secondary | ICD-10-CM

## 2021-06-17 DIAGNOSIS — E1165 Type 2 diabetes mellitus with hyperglycemia: Secondary | ICD-10-CM

## 2021-06-17 MED ORDER — INSULIN LISPRO (1 UNIT DIAL) 100 UNIT/ML (KWIKPEN): 20.0000 [IU] | PEN_INJECTOR | Freq: Three times a day (TID) | SUBCUTANEOUS | 11 refills | Status: DC

## 2021-06-17 NOTE — Progress Notes (Signed)
? ?Chronic Care Management ?Pharmacy Note ? ?06/17/2021 ?Name:  Brooke [name] MRN:  [id number] DOB:  1955/07/07 ? ?Summary: ?Diabetes: ?Uncontrolled A1c 10.9% (has increased); current treatment: BASAGLAR TO 90-93 UNITS, TRULICITY 1.1ET, JARDIANCE 56m daily; Humalog added for meal times (10u breakfast, 10-15 u lunch, 15u dinner) ?PAST meds: Metformin was discontinued due to GFR 28-->new start KCarrington Clamp(pt sees nephro next week, repeat labs were within normal limits)--trying to get, too expensive ?Continues to work on controlling her diabetes, but reports multiple health stressors ?Denies personal and family history of Medullary thyroid cancer (MTC) ?Current glucose readings: fasting glucose: <200, post prandial glucose: having higher post prandials in PM (late night snacking--working on) ?Patient can't afford LElenor LegatoCGM--$75 based on 20% copay humana DME ?Will sample libre 2/loan reader ?Patient enjoying lElenor Legato reports numbers are better controlled; A1c must be <8% for surgery ?Denies hypoglycemic/hyperglycemic symptoms ?Discussed meal planning options and Plate method for healthy eating ?Avoid sugary drinks and desserts ?Incorporate balanced protein, non starchy veggies, 1 serving of carbohydrate with each meal ?Increase water intake ?Increase physical activity as able ?Current exercise: N/A ?[place]ated on [place] BG READINGS; [place]?Assessed patient finances. Enrolled in  the lilly cares patient assistance for basaglar/trulicity/humalog; enrollment sent for BI cares (jardiance); and AZ&me (symbicort) patient assistance; will continue to work on [place] follow up ? ?Subjective: ?Brooke [name] an 66y.o. year old female who is a primary patient of JLoman Brooklyn FNP.  The CCM team was consulted for assistance with disease management and care coordination needs.   ? ?Engaged with patient by telephone for follow up visit in response to provider referral for pharmacy case management  and/or care coordination services.  ? ?Consent to Services:  ?The patient was given information about Chronic Care Management services, agreed to services, and gave verbal consent prior to initiation of services.  Please see initial visit note for detailed documentation.  ? ?Patient Care Team: ?JLoman Brooklyn FNP as PCP - General (Family Medicine) ?[facility](Ph[facility] ?JCelestia Khat OD (Optometry) ? ?Objective: ? ?Lab Results  ?Component Value Date  ? CREATININE 1.76 (H) 05/12/2021  ? CREATININE 1.82 (H) 02/10/2021  ? CREATININE 1.92 (H) 12/10/2020  ? ? ?Lab Results  ?Component Value Date  ? HGBA1C 10.0 (H) 05/12/2021  ? ?Last diabetic Eye exam:  ?Lab Results  ?Component Value Date/Time  ? HMDIABEYEEXA No Retinopathy 04/13/2021 12:00 AM  ?  ?Last diabetic Foot exam: No results found for: HMDIABFOOTEX  ? ?   ?Component Value Date/Time  ? CHOL 187 05/12/2021 1527  ? TRIG 304 (H) 05/12/2021 1527  ? HDL 43 05/12/2021 1527  ? CHOLHDL 4.3 05/12/2021 1527  ? [place]93 05/12/2021 1527  ? ? ? ?  Latest Ref Rng & Units 05/12/2021  ?  3:27 PM 02/10/2021  ? 10:29 AM 11/05/2020  ?  8:39 AM  ?Hepatic Function  ?Total Protein 6.0 - 8.5 g/dL 7.1   7.2   6.9    ?Albumin 3.8 - 4.8 g/dL 4.0   4.0   3.9    ?AST 0 - 40 IU/L '14   13   13    ' ?ALT 0 - 32 IU/L '18   15   14    ' ?Alk Phosphatase 44 - 121 IU/L 93   102   87    ?Total Bilirubin 0.0 - 1.2 mg/dL 0.3   0.2   0.3    ? ? ?Lab Results  ?Component Value Date/Time  ?  TSH 1.880 03/13/2019 09:55 AM  ? ? ? ?  Latest Ref Rng & Units 05/12/2021  ?  3:27 PM 02/10/2021  ? 10:29 AM 11/05/2020  ?  8:39 AM  ?CBC  ?WBC 3.4 - 10.8 x10E3/uL 9.0   9.5   8.4    ?Hemoglobin 11.1 - 15.9 g/dL 12.4   12.8   12.9    ?Hematocrit 34.0 - 46.6 % 38.1   39.2   39.6    ?Platelets 150 - 450 x10E3/uL 395   411   411    ? ? ?No results found for: VD25OH ? ?Clinical ASCVD: No  ?The 10-year ASCVD risk score (Arnett DK, et al., 2019) is: 19% ?  Values used to calculate the score: ?    Age: 66 years ?    Sex:  Female ?    Is Non-Hispanic African American: Yes ?    Diabetic: Yes ?    Tobacco smoker: No ?    Systolic Blood Pressure: 381 mmHg ?    Is BP treated: Yes ?    HDL Cholesterol: 43 mg/dL ?    Total Cholesterol: 187 mg/dL   ? ?Other: (CHADS2VASc if Afib, PHQ9 if depression, MMRC or CAT for COPD, ACT, DEXA) ? ?Social History  ? ?Tobacco Use  ?Smoking Status Never  ?Smokeless Tobacco Never  ? ?BP Readings from Last 3 Encounters:  ?05/12/21 119/76  ?03/24/21 128/73  ?02/10/21 125/76  ? ?Pulse Readings from Last 3 Encounters:  ?05/12/21 75  ?03/24/21 72  ?02/10/21 73  ? ?Wt Readings from Last 3 Encounters:  ?05/12/21 206 lb (93.4 kg)  ?03/24/21 204 lb 9.6 oz (92.8 kg)  ?02/10/21 198 lb 12.8 oz (90.2 kg)  ? ? ?Assessment: Review of patient past medical history, allergies, medications, health status, including review of consultants reports, laboratory and other test data, was performed as part of comprehensive evaluation and provision of chronic care management services.  ? ?SDOH:  (Social Determinants of Health) assessments and interventions performed:  ? ? ?CCM Care Plan ? ?No Known Allergies ? ?Medications Reviewed Today   ? ? Reviewed by Lavera Guise, [facility] (Pharmacist) on 06/17/21 at 0847  Med List Status: <None>  ? ?Medication Order Taking? Sig Documenting Provider Last Dose Status Informant  ?albuterol (VENTOLIN HFA) 108 (90 Base) MCG/ACT inhaler [id number] No Inhale 2 puffs into the lungs every 6 (six) hours as needed for wheezing or shortness of breath. [name], FNP Taking Active Self  ?amLODipine (NORVASC) 10 MG tablet [id number]  Take 1 tablet (10 mg total) by mouth daily. [name], FNP  Active   ?atenolol (TENORMIN) 100 MG tablet [id number]  Take 1 tablet (100 mg total) by mouth every morning. [name], FNP  Active   ?Cholecalciferol 25 MCG (1000 UT) capsule [id number] No Take by mouth. [provider] Taking Active   ?Continuous Blood Gluc Sensor (FREESTYLE LIBRE 2 SENSOR) MISC  [id number]  1 each by Does not apply route every 14 (fourteen) days. [name], FNP  Active   ?         ?Med Note (Sammi Stolarz D   Wed Jun 09, 2021 10:29 AM) Sample--patient can't afford 20% per [facility] DME  ?doxepin (SINEQUAN) 10 MG capsule [id number]  Take 1 capsule (10 mg total) by mouth at bedtime. [name], FNP  Active   ?Dulaglutide (TRULICITY) 4.5 DT/2.6ZT SOPN [id number]  Inject 4.5 mg as directed once a week. [name], FNP  Active   ?         ?  Med Note (Keldric Poyer D   Wed Jun 09, 2021 10:30 AM) Via [employer] [place] Cares patient assistance program ? ?  ?empagliflozin (JARDIANCE) 25 MG TABS tablet [id number]  Take 1 tablet (25 mg total) by mouth daily before breakfast. [name], FNP  Active   ?Finerenone (KERENDIA) 10 MG TABS [id number] No Take 10 mg by mouth daily. [name], FNP Taking Active   ?hydrochlorothiazide (HYDRODIURIL) 25 MG tablet [id number] No Take 1 tablet (25 mg total) by mouth daily. [name], FNP Taking Active   ?  Discontinued 06/17/21 0847 (Change in therapy)   ?Insulin Glargine (BASAGLAR KWIKPEN) 100 UNIT/ML [id number] No Inject 35 Units into the skin at bedtime. [provider] Taking Active   ?         ?Med Note Parthenia Ames Dec 31, 2020 12:05 PM) Via lilly cares patient assistance program  ?Insulin Pen Needle 32G X 6 MM MISC [id number] No Use to inject insulin once daily. DX:E11.65 [name], FNP Taking Active   ?lisinopril (ZESTRIL) 10 MG tablet [id number] No Take 1 tablet (10 mg total) by mouth daily. [name], FNP Taking Active   ?lovastatin (MEVACOR) 40 MG tablet [id number]  Take 1.5 tablets (60 mg total) by mouth at bedtime. [name], FNP  Active   ?SYMBICORT 160-4.5 MCG/ACT inhaler [id number] No INHALE 2 PUFFS INTO THE LUNGS 2 (TWO) TIMES DAILY. [name], FNP Taking Active   ?         ?Med Note Shara Blazing Mar 22, 2021  9:56 AM) Via az&me patient assistance  ?Med List Note Lavera Guise  [facility] 19/94/12 9047): trulicity, basaglar, humalog escribe to [facility] specialty mail order ?symbicort-escribe to [facility] mail order ?jardiance-escribe to [facility] mail order ?  ? ?  ?  ? ?  ? ? ?Patien

## 2021-06-17 NOTE — Patient Instructions (Signed)
Visit Information ? ?Following are the goals we discussed today:  ?Current Barriers:  ?Unable to independently afford treatment regimen ?Unable to achieve control of T2DM  ? ?Pharmacist Clinical Goal(s):  ?Over the next 90 days, patient will verbalize ability to afford treatment regimen ?maintain control of T2DM as evidenced by GOAL A1C<7%  through collaboration with PharmD and provider.  ? ? ?Interventions: ?1:1 collaboration with [name], FNP regarding development and update of comprehensive plan of care as evidenced by provider attestation and co-signature ?Inter-disciplinary care team collaboration (see longitudinal plan of care) ?Comprehensive medication review performed; medication list updated in electronic medical record ? ?Diabetes: ?Uncontrolled A1c 10.9% (has increased); current treatment: BASAGLAR TO 38-25 UNITS, TRULICITY 4.'5MG'$ , JARDIANCE '25mg'$  daily; Humalog added for meal times (10u breakfast, 10-15 u lunch, 15u dinner) ?PAST meds: Metformin was discontinued due to GFR 28-->new start Carrington Clamp (pt sees nephro next week, repeat labs were within normal limits)--trying to get, too expensive ?Continues to work on controlling her diabetes, but reports multiple health stressors ?Denies personal and family history of Medullary thyroid cancer (MTC) ?Current glucose readings: fasting glucose: <200, post prandial glucose: having higher post prandials in PM (late night snacking--working on) ?Patient can't afford Elenor Legato CGM--$75 based on 20% copay humana DME ?Will sample libre 2/loan reader ?Patient enjoying Elenor Legato; reports numbers are better controlled; A1c must be <8% for surgery ?Denies hypoglycemic/hyperglycemic symptoms ?Discussed meal planning options and Plate method for healthy eating ?Avoid sugary drinks and desserts ?Incorporate balanced protein, non starchy veggies, 1 serving of carbohydrate with each meal ?Increase water intake ?Increase physical activity as able ?Current exercise: N/A ?Educated  on [place], BG READINGS; new start [place] ?Assessed patient finances. Enrolled in  the lilly cares patient assistance for basaglar/trulicity/humalog; enrollment sent for BI cares (jardiance); and AZ&me (symbicort) patient assistance; will continue to work on [place] at follow up ? ? ?Patient Goals/Self-Care Activities ?Over the next 90 days, patient will:  ?- take medications as prescribed ?check glucose DAILY, document, and provide at future appointments ? ?Follow Up Plan: Telephone follow up appointment with care management team member scheduled for: 06/24/21 ? ? ?Plan: Face to Face appointment with care management team member scheduled for: 06/24/21 ? ?Signature ?Regina Eck, PharmD, BCPS ?Clinical Pharmacist, [facility] ?[place]  II Phone [phone] ? ? ?Please call the care guide team at [phone] if you need to cancel or reschedule your appointment.  ? ?The patient verbalized understanding of instructions, educational materials, and care plan provided today and declined offer to receive copy of patient instructions, educational materials, and care plan.  ? ?

## 2021-06-24 ENCOUNTER — Ambulatory Visit: Payer: Medicare HMO | Admitting: Pharmacist

## 2021-06-24 DIAGNOSIS — Z794 Long term (current) use of insulin: Secondary | ICD-10-CM

## 2021-06-24 DIAGNOSIS — E1122 Type 2 diabetes mellitus with diabetic chronic kidney disease: Secondary | ICD-10-CM

## 2021-06-24 NOTE — Progress Notes (Signed)
? ? ?Chronic Care Management ?Pharmacy Note ? ?06/24/2021 ?Name:  Brooke [name] MRN:  [id number] DOB:  January 24, 1956 ? ?Summary: ? ?Diabetes: ?Uncontrolled A1c 10.9% (has increased); current treatment: BASAGLAR TO 38-33 UNITS, TRULICITY 3.8VA, JARDIANCE 53m daily; Humalog added for meal times (10u breakfast, 15-20 u lunch, 15-20u dinner) ?PAST meds: Metformin was discontinued due to GFR 28-->new start KCarrington Clamp(pt sees nephro next week, repeat labs were within normal limits)--trying to get, too expensive ?Continues to work on controlling her diabetes, but reports multiple health stressors ?Denies personal and family history of Medullary thyroid cancer (MTC) ?Current glucose readings: fasting glucose: <200, post prandial glucose: having higher post prandials in PM (late night snacking--working on) ?Patient can't afford LElenor LegatoCGM--$75 based on 20% copay humana DME ?Will sample libre 2/loan reader ?Patient enjoying lElenor Legato reports numbers are better controlled; A1c must be <8% for surgery ? ? ?Denies hypoglycemic/hyperglycemic symptoms ?Discussed meal planning options and Plate method for healthy eating ?Avoid sugary drinks and desserts ?Incorporate balanced protein, non starchy veggies, 1 serving of carbohydrate with each meal ?Increase water intake ?Increase physical activity as able ?Current exercise: N/A ?Educated on [place] BG READINGS; new start lHelen?Assessed patient finances. Enrolled in  the lilly cares patient assistance for basaglar/trulicity/humalog; enrollment sent for BI cares (jardiance); and AZ&me (symbicort) patient assistance; will continue to work on [place] follow up ? ?Subjective: ?Brooke Nofzigeris an 66y.o. year old female who is a primary patient of JLoman Brooklyn FNP.  The CCM team was consulted for assistance with disease management and care coordination needs.   ? ?Engaged with patient face to face for follow up visit in response to provider referral for pharmacy case  management and/or care coordination services.  ? ?Consent to Services:  ?The patient was given information about Chronic Care Management services, agreed to services, and gave verbal consent prior to initiation of services.  Please see initial visit note for detailed documentation.  ? ?Patient Care Team: ?JLoman Brooklyn FNP as PCP - General (Family Medicine) ?[facility]avera Guise [facility](Pharmacist) ?JCelestia Khat OD (Optometry) ? ?Objective: ? ?Lab Results  ?Component Value Date  ? CREATININE 1.76 (H) 05/12/2021  ? CREATININE 1.82 (H) 02/10/2021  ? CREATININE 1.92 (H) 12/10/2020  ? ? ?Lab Results  ?Component Value Date  ? HGBA1C 10.0 (H) 05/12/2021  ? ?Last diabetic Eye exam:  ?Lab Results  ?Component Value Date/Time  ? HMDIABEYEEXA No Retinopathy 04/13/2021 12:00 AM  ?  ?Last diabetic Foot exam: No results found for: HMDIABFOOTEX  ? ?   ?Component Value Date/Time  ? CHOL 187 05/12/2021 1527  ? TRIG 304 (H) 05/12/2021 1527  ? HDL 43 05/12/2021 1527  ? CHOLHDL 4.3 05/12/2021 1527  ? LG[place]93 05/12/2021 1527  ? ? ? ?  Latest Ref Rng & Units 05/12/2021  ?  3:27 PM 02/10/2021  ? 10:29 AM 11/05/2020  ?  8:39 AM  ?Hepatic Function  ?Total Protein 6.0 - 8.5 g/dL 7.1   7.2   6.9    ?Albumin 3.8 - 4.8 g/dL 4.0   4.0   3.9    ?AST 0 - 40 IU/L '14   13   13    ' ?ALT 0 - 32 IU/L '18   15   14    ' ?Alk Phosphatase 44 - 121 IU/L 93   102   87    ?Total Bilirubin 0.0 - 1.2 mg/dL 0.3   0.2   0.3    ? ? ?Lab  Results  ?Component Value Date/Time  ? TSH 1.880 03/13/2019 09:55 AM  ? ? ? ?  Latest Ref Rng & Units 05/12/2021  ?  3:27 PM 02/10/2021  ? 10:29 AM 11/05/2020  ?  8:39 AM  ?CBC  ?WBC 3.4 - 10.8 x10E3/uL 9.0   9.5   8.4    ?Hemoglobin 11.1 - 15.9 g/dL 12.4   12.8   12.9    ?Hematocrit 34.0 - 46.6 % 38.1   39.2   39.6    ?Platelets 150 - 450 x10E3/uL 395   411   411    ? ? ?No results found for: VD25OH ? ?Clinical ASCVD: No  ?The 10-year ASCVD risk score (Arnett DK, et al., 2019) is: 19% ?  Values used to calculate the score: ?    Age: 1  years ?    Sex: Female ?    Is Non-Hispanic African American: Yes ?    Diabetic: Yes ?    Tobacco smoker: No ?    Systolic Blood Pressure: 889 mmHg ?    Is BP treated: Yes ?    HDL Cholesterol: 43 mg/dL ?    Total Cholesterol: 187 mg/dL   ? ?Other: (CHADS2VASc if Afib, PHQ9 if depression, MMRC or CAT for COPD, ACT, DEXA) ? ?Social History  ? ?Tobacco Use  ?Smoking Status Never  ?Smokeless Tobacco Never  ? ?BP Readings from Last 3 Encounters:  ?05/12/21 119/76  ?03/24/21 128/73  ?02/10/21 125/76  ? ?Pulse Readings from Last 3 Encounters:  ?05/12/21 75  ?03/24/21 72  ?02/10/21 73  ? ?Wt Readings from Last 3 Encounters:  ?05/12/21 206 lb (93.4 kg)  ?03/24/21 204 lb 9.6 oz (92.8 kg)  ?02/10/21 198 lb 12.8 oz (90.2 kg)  ? ? ?Assessment: Review of patient past medical history, allergies, medications, health status, including review of consultants reports, laboratory and other test data, was performed as part of comprehensive evaluation and provision of chronic care management services.  ? ?SDOH:  (Social Determinants of Health) assessments and interventions performed:  ? ? ?CCM Care Plan ? ?No Known Allergies ? ?Medications Reviewed Today   ? ? Reviewed by Lavera Guise, [facility] (Pharmacist) on 06/24/21 at [place] List Status: <None>  ? ?Medication Order Taking? Sig Documenting Provider Last Dose Status Informant  ?albuterol (VENTOLIN HFA) 108 (90 Base) MCG/ACT inhaler [id number] No Inhale 2 puffs into the lungs every 6 (six) hours as needed for wheezing or shortness of breath. [name], FNP Taking Active Self  ?amLODipine (NORVASC) 10 MG tablet [id number]  Take 1 tablet (10 mg total) by mouth daily. [name], FNP  Active   ?atenolol (TENORMIN) 100 MG tablet [id number]  Take 1 tablet (100 mg total) by mouth every morning. [name], FNP  Active   ?Cholecalciferol 25 MCG (1000 UT) capsule [id number] No Take by mouth. [provider] Taking Active   ?Continuous Blood Gluc Sensor (FREESTYLE LIBRE  2 SENSOR) MISC [id number]  1 each by Does not apply route every 14 (fourteen) days. [name], FNP  Active   ?         ?Med Note (Lorren Splawn D   Wed Jun 09, 2021 10:29 AM) Sample--patient can't afford 20% per [facility] DME  ?doxepin (SINEQUAN) 10 MG capsule [id number]  Take 1 capsule (10 mg total) by mouth at bedtime. [name], FNP  Active   ?Dulaglutide (TRULICITY) 4.5 OF/1.2RF SOPN [id number]  Inject 4.5 mg as directed once a  week. [name], FNP  Active   ?         ?Med Note (Christifer Chapdelaine D   Wed Jun 09, 2021 10:30 AM) Via [employer] [place] Cares patient assistance program ? ?  ?empagliflozin (JARDIANCE) 25 MG TABS tablet [id number]  Take 1 tablet (25 mg total) by mouth daily before breakfast. [name], FNP  Active   ?Finerenone (KERENDIA) 10 MG TABS [id number] No Take 10 mg by mouth daily. [name], FNP Taking Active   ?hydrochlorothiazide (HYDRODIURIL) 25 MG tablet [id number] No Take 1 tablet (25 mg total) by mouth daily. [name], FNP Taking Active   ?Insulin Glargine (BASAGLAR KWIKPEN) 100 UNIT/ML [id number] No Inject 40-45 Units into the skin at bedtime. [provider] Taking Active   ?         ?Med Note Parthenia Ames Dec 31, 2020 12:05 PM) Via lilly cares patient assistance program  ?insulin lispro (HUMALOG KWIKPEN) 100 UNIT/ML KwikPen [id number]  Inject 20-30 Units into the skin with breakfast, with lunch, and with evening meal. [name], FNP  Active   ?Insulin Pen Needle 32G X 6 MM MISC [id number] No Use to inject insulin once daily. DX:E11.65 [name], FNP Taking Active   ?lisinopril (ZESTRIL) 10 MG tablet [id number] No Take 1 tablet (10 mg total) by mouth daily. [name], FNP Taking Active   ?lovastatin (MEVACOR) 40 MG tablet [id number]  Take 1.5 tablets (60 mg total) by mouth at bedtime. [name], FNP  Active   ?SYMBICORT 160-4.5 MCG/ACT inhaler [id number] No INHALE 2 PUFFS INTO THE LUNGS 2 (TWO) TIMES DAILY. [name], FNP  Taking Active   ?         ?Med Note Shara Blazing Mar 22, 2021  9:56 AM) Via az&me patient assistance  ?Med List Note Lavera Guise [facility] [birth year] 9692): trulicity, basaglar, humalog escribe to

## 2021-06-24 NOTE — Patient Instructions (Signed)
Visit Information ? ?Following are the goals we discussed today:  ?Current Barriers:  ?Unable to independently afford treatment regimen ?Unable to achieve control of T2DM  ? ?Pharmacist Clinical Goal(s):  ?Over the next 90 days, patient will verbalize ability to afford treatment regimen ?maintain control of T2DM as evidenced by GOAL A1C<7%  through collaboration with PharmD and provider.  ? ? ?Interventions: ?1:1 collaboration with [name], FNP regarding development and update of comprehensive plan of care as evidenced by provider attestation and co-signature ?Inter-disciplinary care team collaboration (see longitudinal plan of care) ?Comprehensive medication review performed; medication list updated in electronic medical record ? ?Diabetes: ?Uncontrolled A1c 10.9% (has increased); current treatment: BASAGLAR TO 09-81 UNITS, TRULICITY 4.'5MG'$ , JARDIANCE '25mg'$  daily; Humalog added for meal times (10u breakfast, 15-20 u lunch, 15-20u dinner) ?PAST meds: Metformin was discontinued due to GFR 28-->new start Carrington Clamp (pt sees nephro next week, repeat labs were within normal limits)--trying to get, too expensive ?Continues to work on controlling her diabetes, but reports multiple health stressors ?Denies personal and family history of Medullary thyroid cancer (MTC) ?Current glucose readings: fasting glucose: <200, post prandial glucose: having higher post prandials in PM (late night snacking--working on) ?Patient can't afford Elenor Legato CGM--$75 based on 20% copay humana DME ?Will sample libre 2/loan reader ?Patient enjoying Elenor Legato; reports numbers are better controlled; A1c must be <8% for surgery ? ? ?Denies hypoglycemic/hyperglycemic symptoms ?Discussed meal planning options and Plate method for healthy eating ?Avoid sugary drinks and desserts ?Incorporate balanced protein, non starchy veggies, 1 serving of carbohydrate with each meal ?Increase water intake ?Increase physical activity as able ?Current exercise:  N/A ?Educated on [place], BG READINGS; new start [place] ?Assessed patient finances. Enrolled in  the lilly cares patient assistance for basaglar/trulicity/humalog; enrollment sent for BI cares (jardiance); and AZ&me (symbicort) patient assistance; will continue to work on [place] at follow up ? ? ?Patient Goals/Self-Care Activities ?Over the next 90 days, patient will:  ?- take medications as prescribed ?check glucose DAILY, document, and provide at future appointments ? ?Follow Up Plan: Telephone follow up appointment with care management team member scheduled for: 06/24/21 ? ? ?Plan: Face to Face appointment with care management team member scheduled for: 07/07/21 ? ?Signature ?Regina Eck, PharmD, BCPS ?Clinical Pharmacist, [facility] ?[place]  [place] Phone [phone] ? ? ?Please call the care guide team at [phone] if you need to cancel or reschedule your appointment.  ? ?The patient verbalized understanding of instructions, educational materials, and care plan provided today and declined offer to receive copy of patient instructions, educational materials, and care plan.  ? ?

## 2021-07-04 DIAGNOSIS — E1122 Type 2 diabetes mellitus with diabetic chronic kidney disease: Secondary | ICD-10-CM

## 2021-07-04 DIAGNOSIS — N183 Chronic kidney disease, stage 3 unspecified: Secondary | ICD-10-CM

## 2021-07-04 DIAGNOSIS — Z794 Long term (current) use of insulin: Secondary | ICD-10-CM

## 2021-07-04 DIAGNOSIS — E1165 Type 2 diabetes mellitus with hyperglycemia: Secondary | ICD-10-CM

## 2021-07-06 ENCOUNTER — Encounter: Payer: Self-pay | Admitting: Family Medicine

## 2021-07-06 ENCOUNTER — Ambulatory Visit (INDEPENDENT_AMBULATORY_CARE_PROVIDER_SITE_OTHER): Payer: Medicare HMO | Admitting: Family Medicine

## 2021-07-06 VITALS — BP 136/79 | HR 71 | Temp 97.3°F | Ht 65.5 in | Wt 207.6 lb

## 2021-07-06 DIAGNOSIS — M25562 Pain in left knee: Secondary | ICD-10-CM | POA: Diagnosis not present

## 2021-07-06 DIAGNOSIS — M25561 Pain in right knee: Secondary | ICD-10-CM | POA: Diagnosis not present

## 2021-07-06 DIAGNOSIS — M25512 Pain in left shoulder: Secondary | ICD-10-CM

## 2021-07-06 DIAGNOSIS — G8929 Other chronic pain: Secondary | ICD-10-CM | POA: Diagnosis not present

## 2021-07-06 DIAGNOSIS — M25511 Pain in right shoulder: Secondary | ICD-10-CM | POA: Diagnosis not present

## 2021-07-06 MED ORDER — METHOCARBAMOL 500 MG PO TABS: 500.0000 mg | ORAL_TABLET | Freq: Three times a day (TID) | ORAL | 1 refills | Status: DC | PRN

## 2021-07-06 NOTE — Patient Instructions (Signed)
Tylenol 1,000 mg three times daily as needed for pain. ?

## 2021-07-06 NOTE — Progress Notes (Signed)
? ?Assessment & Plan:  ?1. Chronic pain of both knees ?Encouraged to take Tylenol 1,000 mg 3 times daily as needed for pain. ?- Ambulatory referral to Physical Therapy ? ?2. Chronic pain of both shoulders ?Encouraged to take Tylenol 1,000 mg 3 times daily as needed for pain. ?- methocarbamol (ROBAXIN) 500 MG tablet; Take 1 tablet (500 mg total) by mouth every 8 (eight) hours as needed for muscle spasms.  Dispense: 60 tablet; Refill: 1 ?- Ambulatory referral to Physical Therapy ? ? ?Follow up plan: Return if symptoms worsen or fail to improve. ? ?[name], MSN, APRN, FNP-C ?[place] ? ?Subjective:  ? ?Patient ID: Brooke [name], female    DOB: 17-Sep-1955, 66 y.o.   MRN: [id number] ? ?HPI: ?Brooke [name] is a 66 y.o. female presenting on 07/06/2021 for Knee Pain (Bilateral knee pain after a fall in December. ) ? ?Patient reports bilateral knee pain since she had a fall in December (five months ago).  She describes the pain as throbbing and rates it 8/10 on average.  She feels the pain is getting worse over time.  She has been applying a compression wrap around the right knee and applying ice, and OTC pain relieving creams.  Pain worsens with walking and bending.  She is unable to get down on her knees due to the pain. ? ?She is also having bilateral shoulder pain that started with that fall. ? ? ?ROS: Negative unless specifically indicated above in HPI.  ? ?Relevant past medical history reviewed and updated as indicated.  ? ?Allergies and medications reviewed and updated. ? ? ?Current Outpatient Medications:  ?  albuterol (VENTOLIN HFA) 108 (90 Base) MCG/ACT inhaler, Inhale 2 puffs into the lungs every 6 (six) hours as needed for wheezing or shortness of breath., Disp: 18 g, Rfl: 2 ?  amLODipine (NORVASC) 10 MG tablet, Take 1 tablet (10 mg total) by mouth daily., Disp: 90 tablet, Rfl: 1 ?  atenolol (TENORMIN) 100 MG tablet, Take 1 tablet (100 mg total) by mouth every morning., Disp:  90 tablet, Rfl: 1 ?  Cholecalciferol 25 MCG (1000 UT) capsule, Take by mouth., Disp: , Rfl:  ?  Continuous Blood Gluc Sensor (FREESTYLE LIBRE 2 SENSOR) MISC, 1 each by Does not apply route every 14 (fourteen) days., Disp: 2 each, Rfl: 5 ?  doxepin (SINEQUAN) 10 MG capsule, Take 1 capsule (10 mg total) by mouth at bedtime., Disp: 30 capsule, Rfl: 2 ?  Dulaglutide (TRULICITY) 4.5 QA/8.3MH SOPN, Inject 4.5 mg as directed once a week., Disp: 6 mL, Rfl: 1 ?  empagliflozin (JARDIANCE) 25 MG TABS tablet, Take 1 tablet (25 mg total) by mouth daily before breakfast., Disp: 90 tablet, Rfl: 1 ?  Finerenone (KERENDIA) 10 MG TABS, Take 10 mg by mouth daily., Disp: 90 tablet, Rfl: 1 ?  hydrochlorothiazide (HYDRODIURIL) 25 MG tablet, Take 1 tablet (25 mg total) by mouth daily., Disp: 90 tablet, Rfl: 1 ?  Insulin Glargine (BASAGLAR KWIKPEN) 100 UNIT/ML, Inject 40-45 Units into the skin at bedtime., Disp: , Rfl:  ?  insulin lispro (HUMALOG KWIKPEN) 100 UNIT/ML KwikPen, Inject 20-30 Units into the skin with breakfast, with lunch, and with evening meal., Disp: 45 mL, Rfl: 11 ?  Insulin Pen Needle 32G X 6 MM MISC, Use to inject insulin once daily. DX:E11.65, Disp: 100 each, Rfl: 11 ?  lisinopril (ZESTRIL) 10 MG tablet, Take 1 tablet (10 mg total) by mouth daily., Disp: 90 tablet, Rfl: 1 ?  lovastatin (MEVACOR) 40  MG tablet, Take 1.5 tablets (60 mg total) by mouth at bedtime., Disp: 135 tablet, Rfl: 1 ?  SYMBICORT 160-4.5 MCG/ACT inhaler, INHALE 2 PUFFS INTO THE LUNGS 2 (TWO) TIMES DAILY., Disp: 3 each, Rfl: 1 ? ?No Known Allergies ? ?Objective:  ? ?BP 136/79   Pulse 71   Temp (!) 97.3 ?F (36.3 ?C) (Temporal)   Ht 5' 5.5" (1.664 m)   Wt 207 lb 9.6 oz (94.2 kg)   LMP 03/16/2012   BMI 34.02 kg/m?   ? ?Physical Exam ?Vitals reviewed.  ?Constitutional:   ?   General: She is not in acute distress. ?   Appearance: Normal appearance. She is obese. She is not ill-appearing, toxic-appearing or diaphoretic.  ?HENT:  ?   Head: Normocephalic  and atraumatic.  ?Eyes:  ?   General: No scleral icterus.    ?   Right eye: No discharge.     ?   Left eye: No discharge.  ?   Conjunctiva/sclera: Conjunctivae normal.  ?Cardiovascular:  ?   Rate and Rhythm: Normal rate.  ?Pulmonary:  ?   Effort: Pulmonary effort is normal. No respiratory distress.  ?Musculoskeletal:  ?   Right shoulder: Tenderness present. No swelling, deformity, effusion, laceration, bony tenderness or crepitus. Decreased range of motion. Normal strength. Normal pulse.  ?   Left shoulder: Tenderness present. No swelling, deformity, effusion, laceration, bony tenderness or crepitus. Decreased range of motion. Normal strength. Normal pulse.  ?   Cervical back: Normal range of motion.  ?   Right knee: No swelling, deformity, effusion, erythema, ecchymosis, lacerations or bony tenderness. Normal range of motion. Tenderness present over the medial joint line. Normal pulse.  ?   Instability Tests: Anterior drawer test negative. Posterior drawer test negative. Anterior Lachman test negative. Medial McMurray test negative and lateral McMurray test negative.  ?   Left knee: Normal. No tenderness.  ?Skin: ?   General: Skin is warm and dry.  ?   Capillary Refill: Capillary refill takes less than 2 seconds.  ?Neurological:  ?   General: No focal deficit present.  ?   Mental Status: She is alert and oriented to person, place, and time. Mental status is at baseline.  ?Psychiatric:     ?   Mood and Affect: Mood normal.     ?   Behavior: Behavior normal.     ?   Thought Content: Thought content normal.     ?   Judgment: Judgment normal.  ? ? ? ? ? ? ?

## 2021-07-07 ENCOUNTER — Ambulatory Visit (INDEPENDENT_AMBULATORY_CARE_PROVIDER_SITE_OTHER): Payer: Medicare HMO | Admitting: Pharmacist

## 2021-07-07 DIAGNOSIS — J410 Simple chronic bronchitis: Secondary | ICD-10-CM

## 2021-07-07 NOTE — Progress Notes (Signed)
? ?Chronic Care Management ?Pharmacy Note ? ?07/07/2021 ?Name:  Brooke [name] MRN:  [id number] DOB:  October 04, 1955 ? ?Summary: ? ?Diabetes: ?Uncontrolled A1c 10.9% (has increased); current treatment: BASAGLAR TO 88-91 UNITS, TRULICITY 6.9IH, JARDIANCE 50m daily; Humalog added for meal times (10u breakfast, 15-20 u lunch, 15-20u dinner) ?PAST meds: Metformin was discontinued due to GFR 28-->new start KCarrington Clamp(pt sees nephro next week, repeat labs were within normal limits)--trying to get, too expensive ?Continues to work on controlling her diabetes, but reports multiple health stressors ?Denies personal and family history of Medullary thyroid cancer (MTC) ?Current glucose readings: fasting glucose: <200, post prandial glucose: having higher post prandials in PM (late night snacking--working on) ?Patient can't afford LElenor LegatoCGM--$75 based on 20% copay humana DME ?Will sample libre 2/loan reader-->libre changed today ?Patient enjoying lElenor Legato reports numbers are better controlled--blood sugar has improved, but still not at goal; A1c must be <8% for surgery ?Educated patient on taking rapid insulin at least 15 minutes before meals ? ? ? ?Denies hypoglycemic/hyperglycemic symptoms ?Discussed meal planning options and Plate method for healthy eating ?Avoid sugary drinks and desserts ?Incorporate balanced protein, non starchy veggies, 1 serving of carbohydrate with each meal ?Increase water intake ?Increase physical activity as able ?Current exercise: N/A ?[place]ated on [place] READINGS; libre ?Assessed patient finances. Enrolled in  the lilly cares patient assistance for basaglar/trulicity/humalog; enrollment sent for BI cares (jardiance); and AZ&me (symbicort) patient assistance; will continue to work [place] follow up ? ?Subjective: ?Brooke [name] an 66y.o. year old female who is a primary patient of JLoman Brooklyn FNP.  The CCM team was consulted for assistance with disease management and  care coordination needs.   ? ?Engaged with patient face to face for follow up visit in response to provider referral for pharmacy case management and/or care coordination services.  ? ?Consent to Services:  ?The patient was given information about Chronic Care Management services, agreed to services, and gave verbal consent prior to initiation of services.  Please see initial visit note for detailed documentation.  ? ?Patient Care Team: ?JLoman Brooklyn FNP as PCP - General (Family Medicine) ?[facility]aver[facility](Pharmacist) ?JCelestia Khat OD (Optometry) ? ? ?Objective: ? ?Lab Results  ?Component Value Date  ? CREATININE 1.76 (H) 05/12/2021  ? CREATININE 1.82 (H) 02/10/2021  ? CREATININE 1.92 (H) 12/10/2020  ? ? ?Lab Results  ?Component Value Date  ? HGBA1C 10.0 (H) 05/12/2021  ? ?Last diabetic Eye exam:  ?Lab Results  ?Component Value Date/Time  ? HMDIABEYEEXA No Retinopathy 04/13/2021 12:00 AM  ?  ?Last diabetic Foot exam: No results found for: HMDIABFOOTEX  ? ?   ?Component Value Date/Time  ? CHOL 187 05/12/2021 1527  ? TRIG 304 (H) 05/12/2021 1527  ? HDL 43 05/12/2021 1527  ? CHOLHDL 4.3 05/12/2021 1527  ? LPanama93 05/12/2021 1527  ? ? ? ?  Latest Ref Rng & Units 05/12/2021  ?  3:27 PM 02/10/2021  ? 10:29 AM 11/05/2020  ?  8:39 AM  ?Hepatic Function  ?Total Protein 6.0 - 8.5 g/dL 7.1   7.2   6.9    ?Albumin 3.8 - 4.8 g/dL 4.0   4.0   3.9    ?AST 0 - 40 IU/L '14   13   13    ' ?ALT 0 - 32 IU/L '18   15   14    ' ?Alk Phosphatase 44 - 121 IU/L 93   102   87    ?  Total Bilirubin 0.0 - 1.2 mg/dL 0.3   0.2   0.3    ? ? ?Lab Results  ?Component Value Date/Time  ? TSH 1.880 03/13/2019 09:55 AM  ? ? ? ?  Latest Ref Rng & Units 05/12/2021  ?  3:27 PM 02/10/2021  ? 10:29 AM 11/05/2020  ?  8:39 AM  ?CBC  ?WBC 3.4 - 10.8 x10E3/uL 9.0   9.5   8.4    ?Hemoglobin 11.1 - 15.9 g/dL 12.4   12.8   12.9    ?Hematocrit 34.0 - 46.6 % 38.1   39.2   39.6    ?Platelets 150 - 450 x10E3/uL 395   411   411    ? ? ?No results found for:  VD25OH ? ?Clinical ASCVD: No  ?The 10-year ASCVD risk score (Arnett DK, et al., 2019) is: 24.8% ?  Values used to calculate the score: ?    Age: 66 years ?    Sex: Female ?    Is Non-Hispanic African American: Yes ?    Diabetic: Yes ?    Tobacco smoker: No ?    Systolic Blood Pressure: 277 mmHg ?    Is BP treated: Yes ?    HDL Cholesterol: 43 mg/dL ?    Total Cholesterol: 187 mg/dL   ? ?Other: (CHADS2VASc if Afib, PHQ9 if depression, MMRC or CAT for COPD, ACT, DEXA) ? ?Social History  ? ?Tobacco Use  ?Smoking Status Never  ?Smokeless Tobacco Never  ? ?BP Readings from Last 3 Encounters:  ?07/06/21 136/79  ?05/12/21 119/76  ?03/24/21 128/73  ? ?Pulse Readings from Last 3 Encounters:  ?07/06/21 71  ?05/12/21 75  ?03/24/21 72  ? ?Wt Readings from Last 3 Encounters:  ?07/06/21 207 lb 9.6 oz (94.2 kg)  ?05/12/21 206 lb (93.4 kg)  ?03/24/21 204 lb 9.6 oz (92.8 kg)  ? ? ?Assessment: Review of patient past medical history, allergies, medications, health status, including review of consultants reports, laboratory and other test data, was performed as part of comprehensive evaluation and provision of chronic care management services.  ? ?SDOH:  (Social Determinants of Health) assessments and interventions performed:  ? ? ?CCM Care Plan ? ?No Known Allergies ? ?Medications Reviewed Today   ? ? Reviewed by Lavera Guise, [facility] (Pharmacist) on 07/07/21 at 1138  Med List Status: <None>  ? ?Medication Order Taking? Sig Documenting Provider Last Dose Status Informant  ?albuterol (VENTOLIN HFA) 108 (90 Base) MCG/ACT inhaler [id number] No Inhale 2 puffs into the lungs every 6 (six) hours as needed for wheezing or shortness of breath. [name], FNP Taking Active Self  ?amLODipine (NORVASC) 10 MG tablet [id number] No Take 1 tablet (10 mg total) by mouth daily. [name], FNP Taking Active   ?atenolol (TENORMIN) 100 MG tablet [id number] No Take 1 tablet (100 mg total) by mouth every morning. [name], FNP Taking  Active   ?Cholecalciferol 25 MCG (1000 UT) capsule [id number] No Take by mouth. [provider] Taking Active   ?Continuous Blood Gluc Sensor (FREESTYLE LIBRE 2 SENSOR) MISC [id number] No 1 each by Does not apply route every 14 (fourteen) days. [name], FNP Taking Active   ?         ?Med Note (Brettney Ficken D   Wed Jun 09, 2021 10:29 AM) Sample--patient can't afford 20% per [facility] DME  ?doxepin (SINEQUAN) 10 MG capsule [id number] No Take 1 capsule (10 mg total) by mouth at bedtime. [name],  FNP Taking Active   ?Dulaglutide (TRULICITY) 4.5 AY/0.4HT SOPN [id number] No Inject 4.5 mg as directed once a week. [name], FNP Taking Active   ?         ?Med Note (Salome Cozby D   Wed Jun 09, 2021 10:30 AM) Via [employer] [place] C[employer] patient assistance program ? ?  ?empagliflozin (JARDIANCE) 25 MG TABS tablet [id number] No Take 1 tablet (25 mg total) by mouth daily before breakfast. [name], FNP Taking Active   ?Finerenone (KERENDIA) 10 MG TABS [id number] No Take 10 mg by mouth daily. [name], FNP Taking Active   ?hydrochlorothiazide (HYDRODIURIL) 25 MG tablet [id number] No Take 1 tablet (25 mg total) by mouth daily. [name], FNP Taking Active   ?Insulin Glargine (BASAGLAR KWIKPEN) 100 UNIT/ML [id number] No Inject 40-45 Units into the skin at bedtime. [provider] Taking Active   ?         ?Med Note Parthenia Ames Dec 31, 2020 12:05 PM) Via lilly cares patient assistance program  ?insulin lispro (HUMALOG KWIKPEN) 100 UNIT/ML KwikPen [id number] No Inject 20-30 Units into the skin with breakfast, with lunch, and with evening meal. [name], FNP Taking Active   ?Insulin Pen Needle 32G X 6 MM MISC [id number] No Use to inject insulin once daily. DX:E11.65 [name], FNP Taking Active   ?lisinopril (ZESTRIL) 10 MG tablet [id number] No Take 1 tablet (10 mg total) by mouth daily. [name], FNP Taking Active   ?lovastatin (MEVACOR) 40 MG tablet [id number]  No Take 1.5 tablets (60 mg total) by mouth at bedtime. [name], FNP Taking Active   ?methocarbamol (ROBAXIN) 500 MG tablet [id number]  Take 1 tablet (500 mg total) by mouth every 8 (eight) hours as needed for

## 2021-07-09 MED ORDER — BUDESONIDE-FORMOTEROL FUMARATE 160-4.5 MCG/ACT IN AERO: 2.0000 | INHALATION_SPRAY | Freq: Two times a day (BID) | RESPIRATORY_TRACT | 5 refills | Status: DC

## 2021-07-09 NOTE — Patient Instructions (Signed)
Visit Information ? ?Following are the goals we discussed today:  ?Current Barriers:  ?Unable to independently afford treatment regimen ?Unable to achieve control of T2DM  ? ?Pharmacist Clinical Goal(s):  ?Over the next 90 days, patient will verbalize ability to afford treatment regimen ?maintain control of T2DM as evidenced by GOAL A1C<7%  through collaboration with PharmD and provider.  ? ? ?Interventions: ?1:1 collaboration with [name], FNP regarding development and update of comprehensive plan of care as evidenced by provider attestation and co-signature ?Inter-disciplinary care team collaboration (see longitudinal plan of care) ?Comprehensive medication review performed; medication list updated in electronic medical record ? ?Diabetes: ?Uncontrolled A1c 10.9% (has increased); current treatment: BASAGLAR TO 19-41 UNITS, TRULICITY 4.'5MG'$ , JARDIANCE '25mg'$  daily; Humalog added for meal times (10u breakfast, 15-20 u lunch, 15-20u dinner) ?PAST meds: Metformin was discontinued due to GFR 28-->new start Carrington Clamp (pt sees nephro next week, repeat labs were within normal limits)--trying to get, too expensive ?Continues to work on controlling her diabetes, but reports multiple health stressors ?Denies personal and family history of Medullary thyroid cancer (MTC) ?Current glucose readings: fasting glucose: <200, post prandial glucose: having higher post prandials in PM (late night snacking--working on) ?Patient can't afford Elenor Legato CGM--$75 based on 20% copay humana DME ?Will sample libre 2/loan reader-->libre changed today ?Patient enjoying Elenor Legato; reports numbers are better controlled--blood sugar has improved, but still not at goal; A1c must be <8% for surgery ?Educated patient on taking rapid insulin at least 15 minutes before meals ? ? ? ?Denies hypoglycemic/hyperglycemic symptoms ?Discussed meal planning options and Plate method for healthy eating ?Avoid sugary drinks and desserts ?Incorporate balanced protein,  non starchy veggies, 1 serving of carbohydrate with each meal ?Increase water intake ?Increase physical activity as able ?Current exercise: N/A ?Educated on [place], BG READINGS; libre ?Assessed patient finances. Enrolled in  the lilly cares patient assistance for basaglar/trulicity/humalog; enrollment sent for BI cares (jardiance); and AZ&me (symbicort) patient assistance; will continue to work on [place] at follow up ? ?Patient Goals/Self-Care Activities ?Over the next 90 days, patient will:  ?- take medications as prescribed ?check glucose DAILY, document, and provide at future appointments ? ?Follow Up Plan: Telephone follow up appointment with care management team member scheduled for: tbd ? ? ?Plan: The patient will call pharmd as advised to for libre change.  ? ?Signature ?Regina Eck, PharmD, BCPS ?Clinical Pharmacist, [facility] ?[place]  II Phone [phone] ? ? ?Please call the care guide team at [phone] if you need to cancel or reschedule your appointment.  ? ?The patient verbalized understanding of instructions, educational materials, and care plan provided today and declined offer to receive copy of patient instructions, educational materials, and care plan.  ? ?

## 2021-07-13 ENCOUNTER — Ambulatory Visit: Payer: Medicare HMO | Admitting: Physical Therapy

## 2021-07-16 NOTE — Progress Notes (Addendum)
COVID Vaccine Completed:  Yes ? ?Date of COVID positive in last 90 days:  No ? ?PCP - [name], FNP ?Cardiologist - N/A ? ?Chest x-ray - N/A ?EKG - 07-20-21 Epic ?Stress Test - N/A ?ECHO - N/A ?Cardiac Cath - N/A ?Pacemaker/ICD device last checked: ?Spinal Cord Stimulator: ? ?Bowel Prep - Clear liquids day before and Miralax. Patient is aware  ? ?Sleep Study - N/A ?CPAP -  ? ?Fasting Blood Sugar - 60 to 200 ?Checks Blood Sugar 1-2 times a day.  Has Free style Libre L arm ? ?Blood Thinner Instructions:  N/A ?Aspirin Instructions: ?Last Dose: ? ?Activity level:   Can go up a flight of stairs and perform activities of daily living without stopping and without symptoms of chest pain or shortness of breath. ?   ?Anesthesia review:  CKD, HTN, DM. ? ?BUN 33 and creatinine 2.05 on PAT labs ? ?A1c 9.5 on PAT labs ? ?Patient denies shortness of breath, fever, cough and chest pain at PAT appointment ? ?Patient verbalized understanding of instructions that were given to them at the PAT appointment. Patient was also instructed that they will need to review over the PAT instructions again at home before surgery.  ?

## 2021-07-16 NOTE — Patient Instructions (Addendum)
DUE TO COVID-19 ONLY TWO VISITORS  (aged 68 and older)  IS ALLOWED TO COME WITH YOU AND STAY IN THE WAITING ROOM ONLY DURING PRE OP AND PROCEDURE.   ?**NO VISITORS ARE ALLOWED IN THE SHORT STAY AREA OR RECOVERY ROOM!!** ? ?IF YOU WILL BE ADMITTED INTO THE HOSPITAL YOU ARE ALLOWED ONLY FOUR SUPPORT PEOPLE DURING VISITATION HOURS ONLY (7 AM -8PM)   ?The support person(s) must pass our screening, gel in and out ?Visitors GUEST BADGE MUST BE WORN VISIBLY  ?One adult visitor may remain with you overnight and MUST be in the room by 8 P.M.  ? ?You are not required to quarantine ?Hand Hygiene often ?Do NOT share personal items ?Notify your provider if you are in close contact with someone who has COVID or you develop fever 100.4 or greater, new onset of sneezing, cough, sore throat, shortness of breath or body aches. ? ?     ? Your procedure is scheduled on:  08-04-21 ? ? Report to [facility] Main Entrance ? ?  Report to admitting at 6:15 AM ? ? Call this number if you have problems the morning of surgery [phone] ? ? Follow a clear liquid diet day before surgery ? ? Do not eat food or drink liquids:After Midnight. ? ?Water ?Black Coffee (sugar ok, NO MILK/CREAM OR CREAMERS)  ?Tea (sugar ok, NO MILK/CREAM OR CREAMERS) regular and decaf                             ?Plain Jell-O (NO RED)                                           ?Fruit ices (not with fruit pulp, NO RED)                                     ?Popsicles (NO RED)                                                                  ?Juice: apple, WHITE grape, WHITE cranberry ?Sports drinks like Gatorade (NO RED) ?Clear broth(vegetable,chicken,beef) ?           ? ?FOLLOW BOWEL PREP AND ANY ADDITIONAL PRE OP INSTRUCTIONS YOU RECEIVED FROM YOUR SURGEON'S OFFICE!!! ? ?- Miralax 255g -   At 4 PM the day before surgery, mix contents of container with 64 ounces of sugar free Gatorade.  Drink one glass every 15-30 minutes until finished ? ?  ?Oral Hygiene is also  important to reduce your risk of infection.                                    ?Remember - BRUSH YOUR TEETH THE MORNING OF SURGERY WITH YOUR REGULAR TOOTHPASTE ? ? Do NOT smoke after Midnight ? ?Take these medicines the morning of surgery with A SIP OF WATER:  Amlodipine, Atenolol, Asa Lente.  Okay to use inhalers and bring with you day  of surgery ? ?How to Manage Your Diabetes ?Before and After Surgery ? ?Why is it important to control my blood sugar before and after surgery? ?Improving blood sugar levels before and after surgery helps healing and can limit problems. ?A way of improving blood sugar control is eating a healthy diet by: ? Eating less sugar and carbohydrates ? Increasing activity/exercise ? Talking with your doctor about reaching your blood sugar goals ?High blood sugars (greater than 180 mg/dL) can raise your risk of infections and slow your recovery, so you will need to focus on controlling your diabetes during the weeks before surgery. ?Make sure that the doctor who takes care of your diabetes knows about your planned surgery including the date and location. ? ?How do I manage my blood sugar before surgery? ?Check your blood sugar at least 4 times a day, starting 2 days before surgery, to make sure that the level is not too high or low. ?Check your blood sugar the morning of your surgery when you wake up and every 2 hours until you get to the Short Stay unit. ?If your blood sugar is less than 70 mg/dL, you will need to treat for low blood sugar: ?Do not take insulin. ?Treat a low blood sugar (less than 70 mg/dL) with ? cup of clear juice (cranberry or apple), 4 glucose tablets, OR glucose gel. ?Recheck blood sugar in 15 minutes after treatment (to make sure it is greater than 70 mg/dL). If your blood sugar is not greater than 70 mg/dL on recheck, call [phone] for further instructions. ?Report your blood sugar to the short stay nurse when you get to Short Stay. ? ?If you are admitted to the  hospital after surgery: ?Your blood sugar will be checked by the staff and you will probably be given insulin after surgery (instead of oral diabetes medicines) to make sure you have good blood sugar levels. ?The goal for blood sugar control after surgery is 80-180 mg/dL. ? ? ?WHAT DO I DO ABOUT MY DIABETES MEDICATION? ? ?Do not take oral diabetes medicines (pills) the morning of surgery. ? ?THE DAY BEFORE SURGERY:  Do not take Jardiance. ? Take half of Insulin Glargine at bedtime ? Take Insulin Lispro as prescribed (no bedtime dose)     ? ? ?THE MORNING OF SURGERY:  Do not take Jardiance or Trulicity. ?            Take half of Insulin Lispro if CBG 220 or higher . ? ?Reviewed and Endorsed by [facility] Patient Education Committee, August 2015  ?                  ?           You may not have any metal on your body including hair pins, jewelry, and body piercing ? ?           Do not wear make-up, lotions, powders, perfumes or deodorant ? ?Do not wear nail polish including gel and S&S, artificial/acrylic nails, or any other type of covering on natural nails including finger and toenails. If you have artificial nails, gel coating, etc. that needs to be removed by a nail salon please have this removed prior to surgery or surgery may need to be canceled/ delayed if the surgeon/ anesthesia feels like they are unable to be safely monitored.  ? ?Do not shave  48 hours prior to surgery.  ? ? Do not bring valuables to the hospital. [facility] IS NOT  RESPONSIBLE   FOR VALUABLES. ? ? Contacts, dentures or bridgework may not be worn into surgery. ? ? Bring small overnight bag day of surgery. ?  ?Please read over the following fact sheets you were given: IF [place]  ? ?[place] - Preparing for Surgery ?Before surgery, you can play an important role.  Because skin is not sterile, your skin needs to be as free of germs as possible.  You can reduce the  number of germs on your skin by washing with CHG (chlorahexidine gluconate) soap before surgery.  CHG is an antiseptic cleaner which kills germs and bonds with the skin to continue killing germs even after washing. ?Please DO NOT use if you have an allergy to CHG or antibacterial soaps.  If your skin becomes reddened/irritated stop using the CHG and inform your nurse when you arrive at Short Stay. ?Do not shave (including legs and underarms) for at least 48 hours prior to the first CHG shower.  You may shave your face/neck. ? ?Please follow these instructions carefully: ? 1.  Shower with CHG Soap the night before surgery and the  morning of surgery. ? 2.  If you choose to wash your hair, wash your hair first as usual with your normal  shampoo. ? 3.  After you shampoo, rinse your hair and body thoroughly to remove the shampoo.                            ? 4.  Use CHG as you would any other liquid soap.  You can apply chg directly to the skin and wash.  Gently with a scrungie or clean washcloth. ? 5.  Apply the CHG Soap to your body ONLY FROM THE NECK DOWN.   Do   not use on face/ open      ?                     Wound or open sores. Avoid contact with eyes, ears mouth and   genitals (private parts).  ?                     Production manager,  Genitals (private parts) with your normal soap. ?            6.  Wash thoroughly, paying special attention to the area where your    surgery  will be performed. ? 7.  Thoroughly rinse your body with warm water from the neck down. ? 8.  DO NOT shower/wash with your normal soap after using and rinsing off the CHG Soap. ?               9.  Pat yourself dry with a clean towel. ?           10.  Wear clean pajamas. ?           11.  Place clean sheets on your bed the night of your first shower and do not  sleep with pets. ?Day of Surgery : ?Do not apply any lotions/deodorants the morning of surgery.  Please wear clean clothes to the hospital/surgery center. ? ?FAILURE TO FOLLOW THESE  INSTRUCTIONS MAY RESULT IN THE CANCELLATION OF YOUR SURGERY ? ?PATIENT SIGNATURE_________________________________ ? ?NURSE SIGNATURE__________________________________ ? ?_________________________________________________

## 2021-07-19 DIAGNOSIS — E1129 Type 2 diabetes mellitus with other diabetic kidney complication: Secondary | ICD-10-CM | POA: Diagnosis not present

## 2021-07-19 DIAGNOSIS — R809 Proteinuria, unspecified: Secondary | ICD-10-CM | POA: Diagnosis not present

## 2021-07-19 DIAGNOSIS — N19 Unspecified kidney failure: Secondary | ICD-10-CM | POA: Diagnosis not present

## 2021-07-19 DIAGNOSIS — I129 Hypertensive chronic kidney disease with stage 1 through stage 4 chronic kidney disease, or unspecified chronic kidney disease: Secondary | ICD-10-CM | POA: Diagnosis not present

## 2021-07-19 DIAGNOSIS — N189 Chronic kidney disease, unspecified: Secondary | ICD-10-CM | POA: Diagnosis not present

## 2021-07-19 DIAGNOSIS — E6609 Other obesity due to excess calories: Secondary | ICD-10-CM | POA: Diagnosis not present

## 2021-07-19 DIAGNOSIS — E1122 Type 2 diabetes mellitus with diabetic chronic kidney disease: Secondary | ICD-10-CM | POA: Diagnosis not present

## 2021-07-20 ENCOUNTER — Telehealth: Payer: Self-pay | Admitting: Family Medicine

## 2021-07-20 ENCOUNTER — Encounter (HOSPITAL_COMMUNITY)
Admission: RE | Admit: 2021-07-20 | Discharge: 2021-07-20 | Disposition: A | Discharge status: Home / Self Care | Payer: Medicare HMO | Source: Ambulatory Visit | Attending: Urology | Admitting: Urology

## 2021-07-20 ENCOUNTER — Ambulatory Visit: Payer: Medicare HMO

## 2021-07-20 ENCOUNTER — Encounter (HOSPITAL_COMMUNITY): Payer: Self-pay

## 2021-07-20 ENCOUNTER — Other Ambulatory Visit: Payer: Self-pay

## 2021-07-20 VITALS — BP 138/78 | HR 74 | Temp 98.7°F | Resp 20 | Ht 66.0 in | Wt 206.2 lb

## 2021-07-20 DIAGNOSIS — K769 Liver disease, unspecified: Secondary | ICD-10-CM | POA: Diagnosis not present

## 2021-07-20 DIAGNOSIS — Z794 Long term (current) use of insulin: Secondary | ICD-10-CM

## 2021-07-20 DIAGNOSIS — E119 Type 2 diabetes mellitus without complications: Secondary | ICD-10-CM | POA: Insufficient documentation

## 2021-07-20 DIAGNOSIS — Z01818 Encounter for other preprocedural examination: Secondary | ICD-10-CM | POA: Insufficient documentation

## 2021-07-20 DIAGNOSIS — I2510 Atherosclerotic heart disease of native coronary artery without angina pectoris: Secondary | ICD-10-CM | POA: Diagnosis not present

## 2021-07-20 DIAGNOSIS — E1165 Type 2 diabetes mellitus with hyperglycemia: Secondary | ICD-10-CM

## 2021-07-20 HISTORY — DX: Unspecified osteoarthritis, unspecified site: M19.90

## 2021-07-20 HISTORY — DX: Anemia, unspecified: D64.9

## 2021-07-20 LAB — COMPREHENSIVE METABOLIC PANEL
ALT: 17 U/L (ref 0–44)
AST: 15 U/L (ref 15–41)
Albumin: 3.9 g/dL (ref 3.5–5.0)
Alkaline Phosphatase: 72 U/L (ref 38–126)
Anion gap: 7 (ref 5–15)
BUN: 33 mg/dL — ABNORMAL HIGH (ref 8–23)
CO2: 27 mmol/L (ref 22–32)
Calcium: 9.4 mg/dL (ref 8.9–10.3)
Chloride: 106 mmol/L (ref 98–111)
Creatinine, Ser: 2.05 mg/dL — ABNORMAL HIGH (ref 0.44–1.00)
GFR, Estimated: 26 mL/min — ABNORMAL LOW (ref >60)
Glucose, Bld: 207 mg/dL — ABNORMAL HIGH (ref 70–99)
Potassium: 4.8 mmol/L (ref 3.5–5.1)
Sodium: 140 mmol/L (ref 135–145)
Total Bilirubin: 0.7 mg/dL (ref 0.3–1.2)
Total Protein: 7.9 g/dL (ref 6.5–8.1)

## 2021-07-20 LAB — CBC
HCT: 41.2 % (ref 36.0–46.0)
Hemoglobin: 13 g/dL (ref 12.0–15.0)
MCH: 28 pg (ref 26.0–34.0)
MCHC: 31.6 g/dL (ref 30.0–36.0)
MCV: 88.8 fL (ref 80.0–100.0)
Platelets: 434 10*3/uL — ABNORMAL HIGH (ref 150–400)
RBC: 4.64 MIL/uL (ref 3.87–5.11)
RDW: 14.1 % (ref 11.5–15.5)
WBC: 8.7 10*3/uL (ref 4.0–10.5)
nRBC: 0 % (ref 0.0–0.2)

## 2021-07-20 LAB — HEMOGLOBIN A1C
Hgb A1c MFr Bld: 9.5 % — ABNORMAL HIGH (ref 4.8–5.6)
Mean Plasma Glucose: 225.95 mg/dL

## 2021-07-20 LAB — GLUCOSE, CAPILLARY: Glucose-Capillary: 209 mg/dL — ABNORMAL HIGH (ref 70–99)

## 2021-07-20 NOTE — Telephone Encounter (Signed)
She can schedule to see triage  ?I have a sample--this is the last one we can give before her surgery-->so make sure she doesn't have to have it removed etc ?She can fingerstick if she ever has to go without libre ?I'm booked today ?

## 2021-07-20 NOTE — Progress Notes (Signed)
Libre 2 placed on patients left arm. Tolerated well.  ?

## 2021-07-20 NOTE — Telephone Encounter (Signed)
Appointment has been scheduled.

## 2021-07-20 NOTE — Telephone Encounter (Signed)
Patient's Libre fell off on Sunday and she is calling to see if we have any in stock. Please call back and advise.  ?

## 2021-07-22 ENCOUNTER — Ambulatory Visit: Payer: Medicare HMO | Attending: Family Medicine

## 2021-07-22 DIAGNOSIS — M25562 Pain in left knee: Secondary | ICD-10-CM | POA: Diagnosis not present

## 2021-07-22 DIAGNOSIS — M25561 Pain in right knee: Secondary | ICD-10-CM | POA: Diagnosis not present

## 2021-07-22 DIAGNOSIS — M25512 Pain in left shoulder: Secondary | ICD-10-CM | POA: Diagnosis not present

## 2021-07-22 DIAGNOSIS — M25511 Pain in right shoulder: Secondary | ICD-10-CM | POA: Insufficient documentation

## 2021-07-22 DIAGNOSIS — G8929 Other chronic pain: Secondary | ICD-10-CM | POA: Insufficient documentation

## 2021-07-22 NOTE — Therapy (Signed)
[facility] [place], [place], [address] Phone: [phone]   Fax:  [phone]  Physical Therapy Evaluation  Patient Details  Name: Brooke [name] MRN: [id number] Date of Birth: 04-11-1955 Referring Provider (PT): Blanch Media, [place]   Encounter Date: 07/22/2021   PT End of Session - 07/22/21 1119     Visit Number 1    Number of Visits 12    Date for PT Re-Evaluation 10/01/21    PT Start Time 1120    PT Stop Time 1200    PT Time Calculation (min) 40 min    Activity Tolerance Patient tolerated treatment well    Behavior During Therapy [facility] for tasks assessed/performed             Past Medical History:  Diagnosis Date   Anemia    Arthritis    Bronchitis    frequent bronchitis   Diabetes mellitus    Diabetic nephropathy ([place])    Hepatic steatosis    Hypertension    Hyperuricemia 11/27/2020   Mass of right kidney    Obesity (BMI 35.0-39.9 without comorbidity)    Vertigo    Vitamin D insufficiency 11/27/2020    Past Surgical History:  Procedure Laterality Date   BREAST SURGERY Right 1974    There were no vitals filed for this visit.    Subjective Assessment - 07/22/21 1119     Subjective Patient reports that she fell in December 2022 while carrying a plastic container when she stumbled and landed on her knees and "jarring" her shoulders.She notes that hre pain has been getting worse over the past couple of months. She notes that she has surgery at the end of the month due to a mass on her kidney, but is unsure of the recover timeline.    Limitations Walking;House hold activities;Standing;Lifting    How long can you walk comfortably? about 15-20 minutes    Patient Stated Goals walk longer, reduced pain, and be able to reach overhead    Currently in Pain? Yes    Pain Score 8     Pain Location Knee    Pain Orientation Right;Left    Pain Descriptors / Indicators Tightness;Aching;Sore    Pain Type Chronic pain     Pain Radiating Towards pain began radiating down the back of her right leg along the gastroc    Pain Onset More than a month ago    Pain Frequency Intermittent    Aggravating Factors  bending her knees, walking, standing, stairs    Pain Relieving Factors sitting, heat    Effect of Pain on Daily Activities unable to do her normal activities around her house    Multiple Pain Sites Yes    Pain Score 7    Pain Location Shoulder    Pain Orientation Right;Left   R>L   Pain Descriptors / Indicators Sore;Aching    Pain Type Chronic pain    Pain Radiating Towards none    Pain Onset More than a month ago    Pain Frequency Intermittent    Aggravating Factors  reaching, overhead activities, lifting    Pain Relieving Factors patches,    Effect of Pain on Daily Activities limited with her household activities due to her shoulder pain                [facility] PT Assessment - 07/22/21 0001       Assessment   Medical Diagnosis Chronic pain of both shoulder; chronic pain of both knees  Referring Provider (PT) Blanch Media, FNP    Onset Date/Surgical Date --   December 2022   Hand Dominance Right    Next MD Visit None scheduled    Prior Therapy No      Precautions   Precautions None      Restrictions   Weight Bearing Restrictions No      Balance Screen   Has the patient fallen in the past 6 months No    Has the patient had a decrease in activity level because of a fear of falling?  No    Is the patient reluctant to leave their home because of a fear of falling?  No      Home Social worker Private residence    Living Arrangements Spouse/significant other;Children    [place] to enter    Entrance Stairs-Number of Steps 4    Entrance Stairs-Rails Can reach both    [place] One level      Prior Function   Level of [place] Retired    Leisure walking      Cognition   Overall Cognitive Status Within Functional Limits for tasks  assessed    Attention Focused    Focused Attention Appears intact    Memory Appears intact    Awareness Appears intact    Problem Solving Appears intact      Sensation   Additional Comments Patient reports no numbness or tingling      ROM / Strength   AROM / PROM / Strength Strength;AROM      AROM   AROM Assessment Site Knee;Shoulder    Right/Left Shoulder Right;Left    Right Shoulder Flexion 74 Degrees   limited by pain   Right Shoulder ABduction 64 Degrees   limited by pain   Right Shoulder Internal Rotation --   to PSIS   Right Shoulder External Rotation --   to ear; limited by pain   Left Shoulder Flexion 124 Degrees    Left Shoulder ABduction 84 Degrees   "strain"   Left Shoulder Internal Rotation --   to T11   Left Shoulder External Rotation --   to spine of the left sacpula; sore   Right/Left Knee Right;Left    Right Knee Extension 15   limited by pain   Right Knee Flexion 108   limited by pain   Left Knee Extension 1    Left Knee Flexion 114   sore     Strength   Strength Assessment Site Shoulder;Knee    Right/Left Shoulder Right;Left    Right Shoulder Flexion 3+/5   sore   Right Shoulder ABduction 3/5   limited by pain   Right Shoulder Internal Rotation 4/5   slight pain   Right Shoulder External Rotation 4-/5    Left Shoulder Flexion 3+/5   sore   Left Shoulder ABduction 3/5   limited by pain   Left Shoulder Internal Rotation 4+/5    Left Shoulder External Rotation 4-/5    Right/Left Knee Right;Left    Right Knee Flexion 4/5   sore   Right Knee Extension 4-/5   sore   Left Knee Flexion 4-/5   sore   Left Knee Extension 4-/5      Palpation   Palpation comment TTP: right subscapularis insertion, quadriceps, lateral joint line, gastroc/soleus,  Objective measurements completed on examination: See above findings.                     PT Long Term Goals - 07/22/21 1638       PT LONG TERM GOAL #1    Title Patient will be independent with her HEP.    Time 4    Period Weeks    Status New    Target Date 08/19/21      PT LONG TERM GOAL #2   Title Patient will be able to complete her daily activities without her familiar shoulder and knee pain exceeding 5/10.    Time 4    Period Weeks    Status New    Target Date 08/19/21      PT LONG TERM GOAL #3   Title Patient will be able to demonstrate at least 120 degrees of active right shoulder flexion for improved function with overhead activities.    Time 6    Period Weeks    Status New    Target Date 08/19/21      PT LONG TERM GOAL #4   Title Patient will report being able to stand and walk for at least 40 minutes without being limited by her familiar knee pain.    Time 6    Period Weeks    Status New    Target Date 08/19/21      PT LONG TERM GOAL #5   Title Patient will be able to demonstrate active right knee extension within 5 degrees of neutral for improved gait mechanics.    Time 6    Period Weeks    Status New    Target Date 08/19/21                    Plan - 07/22/21 1628     Clinical Impression Statement Patient is a 66 year old female presenting to physical therapy with bilateral shoulder and knee pain following a fall in December 2022. She presented with moderate to high pain severity of the knees and shoulders, but the right side of both joints were more irritable than the left. This is evidenced by her increased pain with AROM and manual muscle testing and tenderness to palpation of the musculature of the right shoulder and knee. Recommend that she continue with skilled physical therapy to address her remaining impairments to return to her prior level of function.    Personal Factors and Comorbidities Comorbidity 3+;Other;Time since onset of injury/illness/exacerbation    Comorbidities HTN, DM, neuropathy, CKD    Examination-Activity Limitations Locomotion Level;Reach Overhead;Carry;Squat;Stairs;Stand;Lift     Examination-Participation Restrictions Cleaning;Meal Prep;Community Activity    Stability/Clinical Decision Making Evolving/Moderate complexity    Clinical Decision Making Moderate    Rehab Potential Fair   limited by upcoming surgery on 5/31   PT Frequency 3x / week    PT Duration 4 weeks    PT Treatment/Interventions ADLs/Self Care Home Management;Cryotherapy;Electrical Stimulation;Moist Heat;Neuromuscular re-education;Therapeutic exercise;Therapeutic activities;Functional mobility training;Patient/family education;Manual techniques;Taping;Vasopneumatic Device    PT Next Visit Plan nustep, pulleys, isometrics, and modalities as needed    Consulted and Agree with Plan of Care Patient             Patient will benefit from skilled therapeutic intervention in order to improve the following deficits and impairments:  Decreased range of motion, Difficulty walking, Impaired UE functional use, Decreased activity tolerance, Pain, Hypomobility, Decreased mobility, Decreased strength  Visit Diagnosis: Chronic pain of right  knee  Chronic right shoulder pain  Chronic pain of left knee  Chronic left shoulder pain     Problem List Patient Active Problem List   Diagnosis Date Noted   Thickened endometrium 03/24/2021   Vaginal bleeding 03/24/2021   Difficulty sleeping 02/10/2021   Mass of right kidney 02/10/2021   Positive colorectal cancer screening using Cologuard test 12/10/2020   Vitamin D insufficiency 11/27/2020   Diabetic nephropathy ([place])    CKD stage 3 due to type 2 diabetes mellitus ([place]) 03/14/2019   Type 2 diabetes mellitus with hyperglycemia, with long-term current use of insulin ([place]) 03/13/2019   Hyperlipidemia associated with type 2 diabetes mellitus ([place]) 03/13/2019   Hypertension associated with diabetes ([place]) 03/13/2019   Obesity (BMI 30-39.9) 03/13/2019   Chronic bronchitis ([place]) 03/13/2019    Darlin Coco, PT 07/22/2021, 5:56 PM  [facility] [address] [place], [place], [address] Phone: [phone]   Fax:  [phone]  Name: Brooke [name] MRN: [id number] Date of Birth: 02/17/1956

## 2021-07-24 DIAGNOSIS — R809 Proteinuria, unspecified: Secondary | ICD-10-CM | POA: Diagnosis not present

## 2021-07-24 DIAGNOSIS — N19 Unspecified kidney failure: Secondary | ICD-10-CM | POA: Diagnosis not present

## 2021-07-24 DIAGNOSIS — Z6834 Body mass index (BMI) 34.0-34.9, adult: Secondary | ICD-10-CM | POA: Diagnosis not present

## 2021-07-24 DIAGNOSIS — E1122 Type 2 diabetes mellitus with diabetic chronic kidney disease: Secondary | ICD-10-CM | POA: Diagnosis not present

## 2021-07-24 DIAGNOSIS — N2889 Other specified disorders of kidney and ureter: Secondary | ICD-10-CM | POA: Diagnosis not present

## 2021-07-24 DIAGNOSIS — I129 Hypertensive chronic kidney disease with stage 1 through stage 4 chronic kidney disease, or unspecified chronic kidney disease: Secondary | ICD-10-CM | POA: Diagnosis not present

## 2021-07-24 DIAGNOSIS — N189 Chronic kidney disease, unspecified: Secondary | ICD-10-CM | POA: Diagnosis not present

## 2021-07-24 DIAGNOSIS — E1129 Type 2 diabetes mellitus with other diabetic kidney complication: Secondary | ICD-10-CM | POA: Diagnosis not present

## 2021-07-24 DIAGNOSIS — N1832 Chronic kidney disease, stage 3b: Secondary | ICD-10-CM | POA: Diagnosis not present

## 2021-07-26 ENCOUNTER — Ambulatory Visit: Payer: Medicare HMO

## 2021-07-26 DIAGNOSIS — M25512 Pain in left shoulder: Secondary | ICD-10-CM | POA: Diagnosis not present

## 2021-07-26 DIAGNOSIS — M25511 Pain in right shoulder: Secondary | ICD-10-CM | POA: Diagnosis not present

## 2021-07-26 DIAGNOSIS — G8929 Other chronic pain: Secondary | ICD-10-CM

## 2021-07-26 DIAGNOSIS — M25562 Pain in left knee: Secondary | ICD-10-CM | POA: Diagnosis not present

## 2021-07-26 DIAGNOSIS — M25561 Pain in right knee: Secondary | ICD-10-CM | POA: Diagnosis not present

## 2021-07-26 NOTE — Therapy (Signed)
[facility] [place], [place], [address] Phone: [phone]   Fax:  [phone]  Physical Therapy Treatment  Patient Details  Name: Brooke [name] MRN: [id number] Date of Birth: [birth year] Referring Provider (PT): Blanch Media, [place]   Encounter Date: 07/26/2021   PT End of Session - 07/26/21 1439     Visit Number 2    Number of Visits 12    Date for PT Re-Evaluation 10/01/21    PT Start Time 1430    PT Stop Time 6195    PT Time Calculation (min) 45 min    Activity Tolerance Patient tolerated treatment well    Behavior During Therapy [facility] for tasks assessed/performed             Past Medical History:  Diagnosis Date   Anemia    Arthritis    Bronchitis    frequent bronchitis   Diabetes mellitus    Diabetic nephropathy ([place])    Hepatic steatosis    Hypertension    Hyperuricemia 11/27/2020   Mass of right kidney    Obesity (BMI 35.0-39.9 without comorbidity)    Vertigo    Vitamin D insufficiency 11/27/2020    Past Surgical History:  Procedure Laterality Date   BREAST SURGERY Right 1974    There were no vitals filed for this visit.   Subjective Assessment - 07/26/21 1435     Subjective Patient reports that her shoulders are a little sore today from trying to sleep on her side, but her knee has felt better in the past two days than they have in a long time.    Limitations Walking;House hold activities;Standing;Lifting    How long can you walk comfortably? about 15-20 minutes    Patient Stated Goals walk longer, reduced pain, and be able to reach overhead    Pain Score 2     Pain Location Shoulder    Pain Orientation Right;Left    Pain Descriptors / Indicators Sore    Pain Onset More than a month ago    Pain Score 6    Pain Location Knee    Pain Orientation Right;Left    Pain Onset More than a month ago                               [facility] Adult PT Treatment/Exercise - 07/26/21 0001        Exercises   Exercises Knee/Hip;Shoulder      Knee/Hip Exercises: Stretches   Passive Hamstring Stretch Right;3 reps;30 seconds      Knee/Hip Exercises: Aerobic   Nustep L3 x 16 minutes      Knee/Hip Exercises: Standing   Forward Lunges Both;20 reps   onto 6" step   Hip Abduction Both;20 reps;Knee straight    Rocker Board 1 minute   limited due to right knee discomfort     Shoulder Exercises: Standing   Retraction Both;20 reps      Shoulder Exercises: Pulleys   Flexion 5 minutes                          PT Long Term Goals - 07/22/21 1638       PT LONG TERM GOAL #1   Title Patient will be independent with her HEP.    Time 4    Period Weeks    Status New    Target Date 08/19/21  PT LONG TERM GOAL #2   Title Patient will be able to complete her daily activities without her familiar shoulder and knee pain exceeding 5/10.    Time 4    Period Weeks    Status New    Target Date 08/19/21      PT LONG TERM GOAL #3   Title Patient will be able to demonstrate at least 120 degrees of active right shoulder flexion for improved function with overhead activities.    Time 6    Period Weeks    Status New    Target Date 08/19/21      PT LONG TERM GOAL #4   Title Patient will report being able to stand and walk for at least 40 minutes without being limited by her familiar knee pain.    Time 6    Period Weeks    Status New    Target Date 08/19/21      PT LONG TERM GOAL #5   Title Patient will be able to demonstrate active right knee extension within 5 degrees of neutral for improved gait mechanics.    Time 6    Period Weeks    Status New    Target Date 08/19/21                   Plan - 07/26/21 1457     Clinical Impression Statement Treatment focused on her familiar knee pain as this was bothering her the most today. She was introduced to multiple new interventions for reduced pain and improved mobility. She required minimal cueing with  today's new interventions for proper pacing to avoid aggravating her familiar interventions. Her HEP was updated with today's activities as they were effective at reducing her familiar symptoms. She reported feeling better upon the conclusion of treatment. She continues to require skilled physical therapy to address her remaining impairments to return to her prior level of function.    Personal Factors and Comorbidities Comorbidity 3+;Other;Time since onset of injury/illness/exacerbation    Comorbidities HTN, DM, neuropathy, CKD    Examination-Activity Limitations Locomotion Level;Reach Overhead;Carry;Squat;Stairs;Stand;Lift    Examination-Participation Restrictions Cleaning;Meal Prep;Community Activity    Stability/Clinical Decision Making Evolving/Moderate complexity    Rehab Potential Fair   limited by upcoming surgery on 5/31   PT Frequency 3x / week    PT Duration 4 weeks    PT Treatment/Interventions ADLs/Self Care Home Management;Cryotherapy;Electrical Stimulation;Moist Heat;Neuromuscular re-education;Therapeutic exercise;Therapeutic activities;Functional mobility training;Patient/family education;Manual techniques;Taping;Vasopneumatic Device    PT Next Visit Plan nustep, pulleys, isometrics, and modalities as needed    PT Home Exercise Plan Access Code: [facility]  URL: [email or web address]  Date: 07/26/2021  Prepared by: Jacqulynn Cadet    Exercises  - Seated Hamstring Stretch  - 7 x weekly - 3 sets - 30 seconds hold  - Mini Lunge  - 2 x daily - 7 x weekly - 2 sets - 10 reps  - Standing Hip Abduction with Counter Support  - 2 x daily - 7 x weekly - 2 sets - 10 reps  - Seated Scapular Retraction  - 2 x daily - 7 x weekly - 2 sets - 10 reps    Consulted and Agree with Plan of Care Patient             Patient will benefit from skilled therapeutic intervention in order to improve the following deficits and impairments:  Decreased range of motion, Difficulty walking, Impaired UE  functional use, Decreased activity tolerance, Pain, Hypomobility, Decreased mobility, Decreased  strength  Visit Diagnosis: Chronic pain of right knee  Chronic right shoulder pain  Chronic pain of left knee  Chronic left shoulder pain     Problem List Patient Active Problem List   Diagnosis Date Noted   Thickened endometrium 03/24/2021   Vaginal bleeding 03/24/2021   Difficulty sleeping 02/10/2021   Mass of right kidney 02/10/2021   Positive colorectal cancer screening using Cologuard test 12/10/2020   Vitamin D insufficiency 11/27/2020   Diabetic nephropathy ([place])    CKD stage 3 due to type 2 diabetes mellitus ([place]) 03/14/2019   Type 2 diabetes mellitus with hyperglycemia, with long-term current use of insulin ([place]) 03/13/2019   Hyperlipidemia associated with type 2 diabetes mellitus ([place]) 03/13/2019   Hypertension associated with diabetes ([place]) 03/13/2019   Obesity (BMI 30-39.9) 03/13/2019   Chronic bronchitis ([place]) 03/13/2019    Darlin Coco, PT 07/26/2021, 3:36 PM  [facility] [address] [place], [place], [address] Phone: [phone]   Fax:  [phone]  Name: Brooke [name] MRN: [id number] Date of Birth: [birth year]

## 2021-07-27 DIAGNOSIS — N281 Cyst of kidney, acquired: Secondary | ICD-10-CM | POA: Diagnosis not present

## 2021-07-27 DIAGNOSIS — C641 Malignant neoplasm of right kidney, except renal pelvis: Secondary | ICD-10-CM | POA: Diagnosis not present

## 2021-07-29 ENCOUNTER — Ambulatory Visit: Payer: Medicare HMO

## 2021-07-29 DIAGNOSIS — M25512 Pain in left shoulder: Secondary | ICD-10-CM | POA: Diagnosis not present

## 2021-07-29 DIAGNOSIS — M25511 Pain in right shoulder: Secondary | ICD-10-CM | POA: Diagnosis not present

## 2021-07-29 DIAGNOSIS — G8929 Other chronic pain: Secondary | ICD-10-CM

## 2021-07-29 DIAGNOSIS — M25562 Pain in left knee: Secondary | ICD-10-CM | POA: Diagnosis not present

## 2021-07-29 DIAGNOSIS — M25561 Pain in right knee: Secondary | ICD-10-CM | POA: Diagnosis not present

## 2021-07-29 NOTE — Therapy (Signed)
[facility] [place], [place], [address] Phone: [phone]   Fax:  [phone]  Physical Therapy Treatment  Patient Details  Name: Brooke [name] MRN: [id number] Date of Birth: 20-Oct-1955 Referring Provider (PT): Blanch Media, [place]   Encounter Date: 07/29/2021   PT End of Session - 07/29/21 1143     Visit Number 3    Number of Visits 12    Date for PT Re-Evaluation 10/01/21    PT Start Time 1115    PT Stop Time 1203    PT Time Calculation (min) 48 min    Activity Tolerance Patient tolerated treatment well    Behavior During Therapy [facility] for tasks assessed/performed             Past Medical History:  Diagnosis Date   Anemia    Arthritis    Bronchitis    frequent bronchitis   Diabetes mellitus    Diabetic nephropathy ([place])    Hepatic steatosis    Hypertension    Hyperuricemia 11/27/2020   Mass of right kidney    Obesity (BMI 35.0-39.9 without comorbidity)    Vertigo    Vitamin D insufficiency 11/27/2020    Past Surgical History:  Procedure Laterality Date   BREAST SURGERY Right 1974    There were no vitals filed for this visit.   Subjective Assessment - 07/29/21 1132     Subjective Patient reports that her right knee is the main thing that is bothering her today. She notes that her shoulders feel good today. She notes that she felt good for about a day after her last appointment.    Limitations Walking;House hold activities;Standing;Lifting    How long can you walk comfortably? about 15-20 minutes    Patient Stated Goals walk longer, reduced pain, and be able to reach overhead    Currently in Pain? Yes    Pain Score 6     Pain Location Knee    Pain Orientation Right    Pain Onset More than a month ago    Pain Score 0    Pain Location Shoulder    Pain Onset More than a month ago                               [facility] Adult PT Treatment/Exercise - 07/29/21 0001       Knee/Hip  Exercises: Aerobic   Nustep L4 x 15 minutes      Knee/Hip Exercises: Standing   Hip Flexion Both;Knee bent   2 minutes     Knee/Hip Exercises: Seated   Long Arc Quad Both;Weights   2 minutes   Long Arc Quad Weight 3 lbs.      Modalities   Modalities Vasopneumatic      Vasopneumatic   Number Minutes Vasopneumatic  10 minutes    Vasopnuematic Location  Knee    Vasopneumatic Pressure Low    Vasopneumatic Temperature  34                     PT Education - 07/29/21 1243     Education Details benefits of exercise, walking, pain science    Person(s) Educated Patient    Methods Explanation    Comprehension Verbalized understanding                 PT Long Term Goals - 07/22/21 1638       PT LONG TERM GOAL #1  Title Patient will be independent with her HEP.    Time 4    Period Weeks    Status New    Target Date 08/19/21      PT LONG TERM GOAL #2   Title Patient will be able to complete her daily activities without her familiar shoulder and knee pain exceeding 5/10.    Time 4    Period Weeks    Status New    Target Date 08/19/21      PT LONG TERM GOAL #3   Title Patient will be able to demonstrate at least 120 degrees of active right shoulder flexion for improved function with overhead activities.    Time 6    Period Weeks    Status New    Target Date 08/19/21      PT LONG TERM GOAL #4   Title Patient will report being able to stand and walk for at least 40 minutes without being limited by her familiar knee pain.    Time 6    Period Weeks    Status New    Target Date 08/19/21      PT LONG TERM GOAL #5   Title Patient will be able to demonstrate active right knee extension within 5 degrees of neutral for improved gait mechanics.    Time 6    Period Weeks    Status New    Target Date 08/19/21                   Plan - 07/29/21 1228     Clinical Impression Statement Treatment focused on knee interventions for improved lower  extremity strength with moderate difficulty. She required minimal cueing with long arc quads for a full arc of motion to facilitate improved quadriceps engagement. She reported no increase in pain with any of today's interventions. She reported that her knee felt a lot better upon the conclusion of treatment. She continues to require skilled physical therapy to address her remaining impairments to maximize her functional mobility.    Personal Factors and Comorbidities Comorbidity 3+;Other;Time since onset of injury/illness/exacerbation    Comorbidities HTN, DM, neuropathy, CKD    Examination-Activity Limitations Locomotion Level;Reach Overhead;Carry;Squat;Stairs;Stand;Lift    Examination-Participation Restrictions Cleaning;Meal Prep;Community Activity    Stability/Clinical Decision Making Evolving/Moderate complexity    Rehab Potential Fair   limited by upcoming surgery on 5/31   PT Frequency 3x / week    PT Duration 4 weeks    PT Treatment/Interventions ADLs/Self Care Home Management;Cryotherapy;Electrical Stimulation;Moist Heat;Neuromuscular re-education;Therapeutic exercise;Therapeutic activities;Functional mobility training;Patient/family education;Manual techniques;Taping;Vasopneumatic Device    PT Next Visit Plan nustep, pulleys, isometrics, and modalities as needed    PT Home Exercise Plan Access Code: [facility]  URL: [email or web address]  Date: 07/26/2021  Prepared by: Jacqulynn Cadet    Exercises  - Seated Hamstring Stretch  - 7 x weekly - 3 sets - 30 seconds hold  - Mini Lunge  - 2 x daily - 7 x weekly - 2 sets - 10 reps  - Standing Hip Abduction with Counter Support  - 2 x daily - 7 x weekly - 2 sets - 10 reps  - Seated Scapular Retraction  - 2 x daily - 7 x weekly - 2 sets - 10 reps    Consulted and Agree with Plan of Care Patient             Patient will benefit from skilled therapeutic intervention in order to improve the following deficits and impairments:  Decreased  range of motion, Difficulty walking, Impaired UE functional use, Decreased activity tolerance, Pain, Hypomobility, Decreased mobility, Decreased strength  Visit Diagnosis: Chronic pain of right knee  Chronic right shoulder pain  Chronic pain of left knee  Chronic left shoulder pain     Problem List Patient Active Problem List   Diagnosis Date Noted   Thickened endometrium 03/24/2021   Vaginal bleeding 03/24/2021   Difficulty sleeping 02/10/2021   Mass of right kidney 02/10/2021   Positive colorectal cancer screening using Cologuard test 12/10/2020   Vitamin D insufficiency 11/27/2020   Diabetic nephropathy ([place])    CKD stage 3 due to type 2 diabetes mellitus ([place]) 03/14/2019   Type 2 diabetes mellitus with hyperglycemia, with long-term current use of insulin ([place]) 03/13/2019   Hyperlipidemia associated with type 2 diabetes mellitus ([place]) 03/13/2019   Hypertension associated with diabetes ([place]) 03/13/2019   Obesity (BMI 30-39.9) 03/13/2019   Chronic bronchitis ([place]) 03/13/2019    Darlin Coco, PT 07/29/2021, 12:46 PM  [facility] [address] [place], [place], [address] Phone: [phone]   Fax:  [phone]  Name: Lasandra Batley MRN: [id number] Date of Birth: [birth year]

## 2021-08-03 ENCOUNTER — Telehealth: Payer: Self-pay | Admitting: Family Medicine

## 2021-08-03 ENCOUNTER — Encounter: Payer: Self-pay | Admitting: Physical Therapy

## 2021-08-03 ENCOUNTER — Ambulatory Visit: Payer: Medicare HMO | Admitting: Physical Therapy

## 2021-08-03 DIAGNOSIS — Z833 Family history of diabetes mellitus: Secondary | ICD-10-CM | POA: Diagnosis not present

## 2021-08-03 DIAGNOSIS — I129 Hypertensive chronic kidney disease with stage 1 through stage 4 chronic kidney disease, or unspecified chronic kidney disease: Secondary | ICD-10-CM | POA: Diagnosis present

## 2021-08-03 DIAGNOSIS — M1990 Unspecified osteoarthritis, unspecified site: Secondary | ICD-10-CM | POA: Diagnosis not present

## 2021-08-03 DIAGNOSIS — E1169 Type 2 diabetes mellitus with other specified complication: Secondary | ICD-10-CM | POA: Diagnosis present

## 2021-08-03 DIAGNOSIS — Z79899 Other long term (current) drug therapy: Secondary | ICD-10-CM | POA: Diagnosis not present

## 2021-08-03 DIAGNOSIS — I10 Essential (primary) hypertension: Secondary | ICD-10-CM | POA: Diagnosis not present

## 2021-08-03 DIAGNOSIS — E669 Obesity, unspecified: Secondary | ICD-10-CM | POA: Diagnosis present

## 2021-08-03 DIAGNOSIS — E119 Type 2 diabetes mellitus without complications: Secondary | ICD-10-CM | POA: Diagnosis not present

## 2021-08-03 DIAGNOSIS — Z6833 Body mass index (BMI) 33.0-33.9, adult: Secondary | ICD-10-CM | POA: Diagnosis not present

## 2021-08-03 DIAGNOSIS — N2889 Other specified disorders of kidney and ureter: Secondary | ICD-10-CM | POA: Diagnosis present

## 2021-08-03 DIAGNOSIS — N183 Chronic kidney disease, stage 3 unspecified: Secondary | ICD-10-CM | POA: Diagnosis present

## 2021-08-03 DIAGNOSIS — E1122 Type 2 diabetes mellitus with diabetic chronic kidney disease: Secondary | ICD-10-CM | POA: Diagnosis present

## 2021-08-03 DIAGNOSIS — C641 Malignant neoplasm of right kidney, except renal pelvis: Secondary | ICD-10-CM | POA: Diagnosis present

## 2021-08-03 DIAGNOSIS — E1121 Type 2 diabetes mellitus with diabetic nephropathy: Secondary | ICD-10-CM | POA: Diagnosis present

## 2021-08-03 DIAGNOSIS — Z8049 Family history of malignant neoplasm of other genital organs: Secondary | ICD-10-CM | POA: Diagnosis not present

## 2021-08-03 DIAGNOSIS — G8929 Other chronic pain: Secondary | ICD-10-CM

## 2021-08-03 DIAGNOSIS — J449 Chronic obstructive pulmonary disease, unspecified: Secondary | ICD-10-CM | POA: Diagnosis present

## 2021-08-03 DIAGNOSIS — Z8249 Family history of ischemic heart disease and other diseases of the circulatory system: Secondary | ICD-10-CM | POA: Diagnosis not present

## 2021-08-03 DIAGNOSIS — E785 Hyperlipidemia, unspecified: Secondary | ICD-10-CM | POA: Diagnosis present

## 2021-08-03 NOTE — Telephone Encounter (Signed)
Per pt, she needs Elenor Legato ordered and placed. States that she has [employer] Free order and place the device. Tried making an appointment but lost connection.  [employer],  Pt states that she does not get the [place] through her insurance. Have you been giving her samples? Or does she have some type of assistance?  Tried calling pt back but there was no answer.

## 2021-08-03 NOTE — Telephone Encounter (Signed)
Patient is having surgery tomorrow 5/31 and is wanting to know if she needs to come in to have her Brooke [name] removed today. Please  call back and advise.

## 2021-08-03 NOTE — Therapy (Addendum)
[facility] [place], [place], [address] Phone: [phone]   Fax:  [phone]  Physical Therapy Treatment  Patient Details  Name: Brooke [name] MRN: [id number] Date of Birth: 08-Mar-1955 Referring Provider (PT): Blanch Media, [place]   Encounter Date: 08/03/2021   PT End of Session - 08/03/21 1122     Visit Number 4    Number of Visits 12    Date for PT Re-Evaluation 10/01/21    PT Start Time 1120    PT Stop Time 1206    PT Time Calculation (min) 46 min    Activity Tolerance Patient tolerated treatment well    Behavior During Therapy [facility] for tasks assessed/performed             Past Medical History:  Diagnosis Date   Anemia    Arthritis    Bronchitis    frequent bronchitis   Diabetes mellitus    Diabetic nephropathy ([place])    Hepatic steatosis    Hypertension    Hyperuricemia 11/27/2020   Mass of right kidney    Obesity (BMI 35.0-39.9 without comorbidity)    Vertigo    Vitamin D insufficiency 11/27/2020    Past Surgical History:  Procedure Laterality Date   BREAST SURGERY Right 1974    There were no vitals filed for this visit.   Subjective Assessment - 08/03/21 1120     Subjective Patient reports that she is doing better but states that she has one spot in the lateral knee that hurts intermittantly.    Limitations Walking;House hold activities;Standing;Lifting    How long can you walk comfortably? about 15-20 minutes    Patient Stated Goals walk longer, reduced pain, and be able to reach overhead    Currently in Pain? Yes    Pain Score 4     Pain Location Knee    Pain Orientation Right;Lateral    Pain Descriptors / Indicators Sharp    Pain Type Chronic pain    Pain Onset More than a month ago    Pain Frequency Intermittent    Aggravating Factors  putting on shoes, move a certain way                [facility] PT Assessment - 08/03/21 0001       Assessment   Medical Diagnosis Chronic pain of  both shoulder; chronic pain of both knees    Referring Provider (PT) Blanch Media, FNP    Hand Dominance Right    Next MD Visit None scheduled    Prior Therapy No      Precautions   Precautions None      Restrictions   Weight Bearing Restrictions No      Observation/Other Assessments-Edema    Edema Circumferential   R knee 41.3 cm, L knee 37.7 cm                          [facility] Adult PT Treatment/Exercise - 08/03/21 0001       Knee/Hip Exercises: Aerobic   Nustep L4 x 15 minutes      Knee/Hip Exercises: Standing   Hip Flexion AROM;Right;15 reps;Knee bent    Hip Abduction AROM;Right;15 reps;Knee straight      Knee/Hip Exercises: Seated   Long Arc Quad Strengthening;Right;20 reps;Weights    Long Arc Quad Weight 4 lbs.    Hamstring Curl Strengthening;Right;15 reps;Weights    Hamstring Limitations red theraband      Modalities   Modalities  Vasopneumatic      Vasopneumatic   Number Minutes Vasopneumatic  10 minutes    Vasopnuematic Location  Knee    Vasopneumatic Pressure Low    Vasopneumatic Temperature  34      Manual Therapy   Manual Therapy Myofascial release    Myofascial Release Gentle IASTW to R lateral knee, distal ITB to reduce tightnes                       PT Short Term Goals - 08/03/21 1203       PT SHORT TERM GOAL #1   Title nt               PT Long Term Goals - 07/22/21 1638       PT LONG TERM GOAL #1   Title Patient will be independent with her HEP.    Time 4    Period Weeks    Status New    Target Date 08/19/21      PT LONG TERM GOAL #2   Title Patient will be able to complete her daily activities without her familiar shoulder and knee pain exceeding 5/10.    Time 4    Period Weeks    Status New    Target Date 08/19/21      PT LONG TERM GOAL #3   Title Patient will be able to demonstrate at least 120 degrees of active right shoulder flexion for improved function with overhead activities.    Time 6     Period Weeks    Status New    Target Date 08/19/21      PT LONG TERM GOAL #4   Title Patient will report being able to stand and walk for at least 40 minutes without being limited by her familiar knee pain.    Time 6    Period Weeks    Status New    Target Date 08/19/21      PT LONG TERM GOAL #5   Title Patient will be able to demonstrate active right knee extension within 5 degrees of neutral for improved gait mechanics.    Time 6    Period Weeks    Status New    Target Date 08/19/21                   Plan - 08/03/21 1203     Clinical Impression Statement Patient presented in clinic with reports of only intermittant exacerbations of pain especially with knee IR. Patient limted with standing therex as discomfort was reported in L hip. Gentle IASTW completed to R lateral knee and distal ITB with no complaints via patient. Tightness palpable of R distal ITB. More edema noted in R knee as well today. Normal vasopneumatic response noted following removal of the modality.    Personal Factors and Comorbidities Comorbidity 3+;Other;Time since onset of injury/illness/exacerbation    Comorbidities HTN, DM, neuropathy, CKD    Examination-Activity Limitations Locomotion Level;Reach Overhead;Carry;Squat;Stairs;Stand;Lift    Examination-Participation Restrictions Cleaning;Meal Prep;Community Activity    Stability/Clinical Decision Making Evolving/Moderate complexity    Rehab Potential Fair    PT Frequency 3x / week    PT Duration 4 weeks    PT Treatment/Interventions ADLs/Self Care Home Management;Cryotherapy;Electrical Stimulation;Moist Heat;Neuromuscular re-education;Therapeutic exercise;Therapeutic activities;Functional mobility training;Patient/family education;Manual techniques;Taping;Vasopneumatic Device    PT Next Visit Plan nustep, pulleys, isometrics, and modalities as needed    PT Home Exercise Plan Access Code: [facility]  URL: [email or web address]  Date:  07/26/2021  Prepared by: Jacqulynn Cadet    Exercises  - Seated Hamstring Stretch  - 7 x weekly - 3 sets - 30 seconds hold  - Mini Lunge  - 2 x daily - 7 x weekly - 2 sets - 10 reps  - Standing Hip Abduction with Counter Support  - 2 x daily - 7 x weekly - 2 sets - 10 reps  - Seated Scapular Retraction  - 2 x daily - 7 x weekly - 2 sets - 10 reps    Consulted and Agree with Plan of Care Patient             Patient will benefit from skilled therapeutic intervention in order to improve the following deficits and impairments:  Decreased range of motion, Difficulty walking, Impaired UE functional use, Decreased activity tolerance, Pain, Hypomobility, Decreased mobility, Decreased strength  Visit Diagnosis: Chronic pain of right knee  Chronic right shoulder pain     Problem List Patient Active Problem List   Diagnosis Date Noted   Thickened endometrium 03/24/2021   Vaginal bleeding 03/24/2021   Difficulty sleeping 02/10/2021   Mass of right kidney 02/10/2021   Positive colorectal cancer screening using Cologuard test 12/10/2020   Vitamin D insufficiency 11/27/2020   Diabetic nephropathy ([place])    CKD stage 3 due to type 2 diabetes mellitus ([place]) 03/14/2019   Type 2 diabetes mellitus with hyperglycemia, with long-term current use of insulin ([place]) 03/13/2019   Hyperlipidemia associated with type 2 diabetes mellitus ([place]) 03/13/2019   Hypertension associated with diabetes ([place]) 03/13/2019   Obesity (BMI 30-39.9) 03/13/2019   Chronic bronchitis ([place]) 03/13/2019    Standley Brooking, PTA 08/03/2021, 12:08 PM  [facility] [address] [place], [place], [address] Phone: [phone]   Fax:  [phone]  Name: Brooke [name] MRN: [id number] Date of Birth: Apr 12, 1955  PHYSICAL THERAPY DISCHARGE SUMMARY  Visits from Start of Care: 4  Current functional level related to goals / functional outcomes: Patient failed to complete their  recommended POC and is being discharged at this time.    Remaining deficits: No significant change from evaluation    Education / Equipment: HEP   Patient agrees to discharge. Patient goals were not met. Patient is being discharged due to not returning since the last visit.  Jacqulynn Cadet, PT, DPT

## 2021-08-03 NOTE — Telephone Encounter (Signed)
R/C to [facility]. They got disconnected.

## 2021-08-03 NOTE — Anesthesia Preprocedure Evaluation (Signed)
Anesthesia Evaluation  Patient identified by MRN, date of birth, ID band Patient awake    Reviewed: Allergy & Precautions, NPO status , Patient's Chart, lab work & pertinent test results  Airway Mallampati: II  TM Distance: >3 FB Neck ROM: Full    Dental no notable dental hx. (+) Missing, Loose, Poor Dentition,    Pulmonary COPD,  COPD inhaler,  Bronchitis   Pulmonary exam normal breath sounds clear to auscultation       Cardiovascular hypertension, Pt. on medications and Pt. on home beta blockers Normal cardiovascular exam Rhythm:Regular Rate:Normal  07-20-21 EKG SR R 88   Neuro/Psych    GI/Hepatic negative GI ROS, Neg liver ROS,   Endo/Other  diabetes, Type 2  Renal/GU Renal diseaseR renal mass  Results      Component                Value               Date                      CREATININE               2.05 (H)            07/20/2021              K                        4.8                 07/20/2021                    Musculoskeletal  (+) Arthritis ,   Abdominal   Peds  Hematology Lab Results      Component                Value               Date                            HGB                      13.0                07/20/2021                HCT                      41.2                07/20/2021                     PLT                      434 (H)             07/20/2021              Anesthesia Other Findings   Reproductive/Obstetrics                           Anesthesia Physical Anesthesia Plan  ASA: 3  Anesthesia Plan: General   Post-op Pain Management: Lidocaine infusion*, Ofirmev IV (intra-op)* and Ketamine IV*   Induction: Intravenous  PONV Risk Score  and Plan: 4 or greater and Treatment may vary due to age or medical condition, Midazolam, Ondansetron and Dexamethasone  Airway Management Planned: Oral ETT  Additional Equipment: None  Intra-op Plan:   Post-operative  Plan: Extubation in OR  Informed Consent: I have reviewed the patients History and Physical, chart, labs and discussed the procedure including the risks, benefits and alternatives for the proposed anesthesia with the patient or authorized representative who has indicated his/her understanding and acceptance.     Dental advisory given  Plan Discussed with: CRNA and Anesthesiologist  Anesthesia Plan Comments:        Anesthesia Quick Evaluation

## 2021-08-04 ENCOUNTER — Inpatient Hospital Stay (HOSPITAL_COMMUNITY)
Admission: RE | Admit: 2021-08-04 | Discharge: 2021-08-06 | DRG: 658 | Disposition: A | Discharge status: Home / Self Care | Payer: Medicare HMO | Attending: Urology | Admitting: Urology

## 2021-08-04 ENCOUNTER — Ambulatory Visit (HOSPITAL_COMMUNITY)
Admission: RE | Admit: 2021-08-04 | Discharge: 2021-08-04 | Disposition: A | Discharge status: Home / Self Care | Payer: Medicare HMO | Source: Ambulatory Visit | Attending: Urology | Admitting: Urology

## 2021-08-04 ENCOUNTER — Ambulatory Visit (HOSPITAL_BASED_OUTPATIENT_CLINIC_OR_DEPARTMENT_OTHER): Payer: Medicare HMO | Admitting: Anesthesiology

## 2021-08-04 ENCOUNTER — Encounter (HOSPITAL_COMMUNITY): Payer: Self-pay | Admitting: Urology

## 2021-08-04 ENCOUNTER — Encounter (HOSPITAL_COMMUNITY): Payer: Self-pay

## 2021-08-04 ENCOUNTER — Ambulatory Visit (HOSPITAL_COMMUNITY): Payer: Medicare HMO | Admitting: Physician Assistant

## 2021-08-04 ENCOUNTER — Other Ambulatory Visit: Payer: Self-pay

## 2021-08-04 ENCOUNTER — Encounter (HOSPITAL_COMMUNITY)
Admission: RE | Disposition: A | Discharge status: Home / Self Care | Payer: Self-pay | Source: Home / Self Care | Attending: Urology

## 2021-08-04 DIAGNOSIS — E119 Type 2 diabetes mellitus without complications: Secondary | ICD-10-CM | POA: Diagnosis not present

## 2021-08-04 DIAGNOSIS — Z6833 Body mass index (BMI) 33.0-33.9, adult: Secondary | ICD-10-CM

## 2021-08-04 DIAGNOSIS — Z8249 Family history of ischemic heart disease and other diseases of the circulatory system: Secondary | ICD-10-CM | POA: Diagnosis not present

## 2021-08-04 DIAGNOSIS — J449 Chronic obstructive pulmonary disease, unspecified: Secondary | ICD-10-CM | POA: Diagnosis present

## 2021-08-04 DIAGNOSIS — I129 Hypertensive chronic kidney disease with stage 1 through stage 4 chronic kidney disease, or unspecified chronic kidney disease: Secondary | ICD-10-CM | POA: Diagnosis present

## 2021-08-04 DIAGNOSIS — N2889 Other specified disorders of kidney and ureter: Secondary | ICD-10-CM | POA: Diagnosis present

## 2021-08-04 DIAGNOSIS — Z833 Family history of diabetes mellitus: Secondary | ICD-10-CM

## 2021-08-04 DIAGNOSIS — I10 Essential (primary) hypertension: Secondary | ICD-10-CM

## 2021-08-04 DIAGNOSIS — Z8049 Family history of malignant neoplasm of other genital organs: Secondary | ICD-10-CM | POA: Diagnosis not present

## 2021-08-04 DIAGNOSIS — M1990 Unspecified osteoarthritis, unspecified site: Secondary | ICD-10-CM

## 2021-08-04 DIAGNOSIS — E1121 Type 2 diabetes mellitus with diabetic nephropathy: Secondary | ICD-10-CM | POA: Diagnosis present

## 2021-08-04 DIAGNOSIS — N183 Chronic kidney disease, stage 3 unspecified: Secondary | ICD-10-CM | POA: Diagnosis present

## 2021-08-04 DIAGNOSIS — E1122 Type 2 diabetes mellitus with diabetic chronic kidney disease: Secondary | ICD-10-CM | POA: Diagnosis present

## 2021-08-04 DIAGNOSIS — Z85528 Personal history of other malignant neoplasm of kidney: Secondary | ICD-10-CM

## 2021-08-04 DIAGNOSIS — E1169 Type 2 diabetes mellitus with other specified complication: Secondary | ICD-10-CM | POA: Diagnosis present

## 2021-08-04 DIAGNOSIS — Z79899 Other long term (current) drug therapy: Secondary | ICD-10-CM | POA: Diagnosis not present

## 2021-08-04 DIAGNOSIS — E669 Obesity, unspecified: Secondary | ICD-10-CM | POA: Diagnosis present

## 2021-08-04 DIAGNOSIS — C641 Malignant neoplasm of right kidney, except renal pelvis: Principal | ICD-10-CM | POA: Diagnosis present

## 2021-08-04 DIAGNOSIS — Z01818 Encounter for other preprocedural examination: Secondary | ICD-10-CM

## 2021-08-04 DIAGNOSIS — E785 Hyperlipidemia, unspecified: Secondary | ICD-10-CM | POA: Diagnosis present

## 2021-08-04 DIAGNOSIS — J410 Simple chronic bronchitis: Secondary | ICD-10-CM

## 2021-08-04 HISTORY — DX: Personal history of other malignant neoplasm of kidney: Z85.528

## 2021-08-04 HISTORY — DX: Other specified disorders of kidney and ureter: N28.89

## 2021-08-04 HISTORY — PX: ROBOTIC ASSITED PARTIAL NEPHRECTOMY: SHX6087

## 2021-08-04 HISTORY — PX: OPERATIVE ULTRASOUND: SHX5996

## 2021-08-04 LAB — GLUCOSE, CAPILLARY
Glucose-Capillary: 141 mg/dL — ABNORMAL HIGH (ref 70–99)
Glucose-Capillary: 218 mg/dL — ABNORMAL HIGH (ref 70–99)
Glucose-Capillary: 163 mg/dL — ABNORMAL HIGH (ref 70–99)
Glucose-Capillary: 197 mg/dL — ABNORMAL HIGH (ref 70–99)

## 2021-08-04 LAB — ABO/RH: ABO/RH(D): A POS

## 2021-08-04 LAB — HEMOGLOBIN AND HEMATOCRIT, BLOOD
HCT: 38.3 % (ref 36.0–46.0)
Hemoglobin: 11.4 g/dL — ABNORMAL LOW (ref 12.0–15.0)

## 2021-08-04 LAB — TYPE AND SCREEN
ABO/RH(D): A POS
Antibody Screen: NEGATIVE

## 2021-08-04 SURGERY — NEPHRECTOMY, PARTIAL, ROBOT-ASSISTED
Anesthesia: General | Site: Abdomen | Laterality: Right

## 2021-08-04 MED ORDER — CEFAZOLIN SODIUM-DEXTROSE 2-4 GM/100ML-% IV SOLN
2.0000 g | INTRAVENOUS | Status: AC
  Administered 2021-08-04: 2 g via INTRAVENOUS
  Filled 2021-08-04: qty 100

## 2021-08-04 MED ORDER — HYDROMORPHONE HCL 1 MG/ML IJ SOLN
0.5000 mg | INTRAMUSCULAR | Status: DC | PRN
  Administered 2021-08-04: 0.5 mg via INTRAVENOUS
  Filled 2021-08-04: qty 1

## 2021-08-04 MED ORDER — INSULIN ASPART 100 UNIT/ML IJ SOLN
0.0000 [IU] | Freq: Three times a day (TID) | INTRAMUSCULAR | Status: DC
  Administered 2021-08-04: 3 [IU] via SUBCUTANEOUS
  Administered 2021-08-04: 5 [IU] via SUBCUTANEOUS
  Administered 2021-08-05: 3 [IU] via SUBCUTANEOUS
  Administered 2021-08-05 (×2): 2 [IU] via SUBCUTANEOUS
  Administered 2021-08-06: 3 [IU] via SUBCUTANEOUS

## 2021-08-04 MED ORDER — ONDANSETRON HCL 4 MG/2ML IJ SOLN
4.0000 mg | INTRAMUSCULAR | Status: DC | PRN
  Administered 2021-08-04: 4 mg via INTRAVENOUS
  Filled 2021-08-04 (×2): qty 2

## 2021-08-04 MED ORDER — PHENYLEPHRINE HCL-NACL 20-0.9 MG/250ML-% IV SOLN
INTRAVENOUS | Status: DC | PRN
  Administered 2021-08-04: 30 ug/min via INTRAVENOUS

## 2021-08-04 MED ORDER — LIDOCAINE HCL (PF) 2 % IJ SOLN
INTRAMUSCULAR | Status: AC
  Filled 2021-08-04: qty 5

## 2021-08-04 MED ORDER — LIDOCAINE HCL (PF) 2 % IJ SOLN
INTRAMUSCULAR | Status: DC | PRN
  Administered 2021-08-04: 1.5 mg/kg/h via INTRADERMAL

## 2021-08-04 MED ORDER — AMISULPRIDE (ANTIEMETIC) 5 MG/2ML IV SOLN: 10.0000 mg | Freq: Once | INTRAVENOUS | Status: DC | PRN

## 2021-08-04 MED ORDER — SUGAMMADEX SODIUM 200 MG/2ML IV SOLN
INTRAVENOUS | Status: DC | PRN
  Administered 2021-08-04: 200 mg via INTRAVENOUS

## 2021-08-04 MED ORDER — PHENYLEPHRINE HCL-NACL 20-0.9 MG/250ML-% IV SOLN
INTRAVENOUS | Status: AC
  Filled 2021-08-04: qty 250

## 2021-08-04 MED ORDER — ONDANSETRON HCL 4 MG/2ML IJ SOLN
INTRAMUSCULAR | Status: DC | PRN
  Administered 2021-08-04: 4 mg via INTRAVENOUS

## 2021-08-04 MED ORDER — HYDROCODONE-ACETAMINOPHEN 5-325 MG PO TABS
1.0000 | ORAL_TABLET | Freq: Four times a day (QID) | ORAL | 0 refills | Status: DC | PRN
Start: 2021-08-04 — End: 2022-04-07

## 2021-08-04 MED ORDER — ACETAMINOPHEN 10 MG/ML IV SOLN
INTRAVENOUS | Status: DC | PRN
  Administered 2021-08-04: 1000 mg via INTRAVENOUS

## 2021-08-04 MED ORDER — DEXAMETHASONE SODIUM PHOSPHATE 10 MG/ML IJ SOLN
INTRAMUSCULAR | Status: AC
  Filled 2021-08-04: qty 1

## 2021-08-04 MED ORDER — ONDANSETRON HCL 4 MG/2ML IJ SOLN
INTRAMUSCULAR | Status: AC
Start: 2021-08-04 — End: ?
  Filled 2021-08-04: qty 2

## 2021-08-04 MED ORDER — MOMETASONE FURO-FORMOTEROL FUM 200-5 MCG/ACT IN AERO
2.0000 | INHALATION_SPRAY | Freq: Two times a day (BID) | RESPIRATORY_TRACT | Status: DC
  Administered 2021-08-04 – 2021-08-06 (×4): 2 via RESPIRATORY_TRACT
  Filled 2021-08-04: qty 8.8

## 2021-08-04 MED ORDER — HYOSCYAMINE SULFATE 0.125 MG SL SUBL: 0.1250 mg | SUBLINGUAL_TABLET | SUBLINGUAL | Status: DC | PRN

## 2021-08-04 MED ORDER — PROPOFOL 10 MG/ML IV BOLUS
INTRAVENOUS | Status: DC | PRN
  Administered 2021-08-04: 160 mg via INTRAVENOUS

## 2021-08-04 MED ORDER — ACETAMINOPHEN 10 MG/ML IV SOLN
INTRAVENOUS | Status: AC
  Filled 2021-08-04: qty 100

## 2021-08-04 MED ORDER — LIDOCAINE HCL 2 % IJ SOLN
INTRAMUSCULAR | Status: AC
  Filled 2021-08-04: qty 20

## 2021-08-04 MED ORDER — ROCURONIUM BROMIDE 10 MG/ML (PF) SYRINGE
PREFILLED_SYRINGE | INTRAVENOUS | Status: AC
  Filled 2021-08-04: qty 10

## 2021-08-04 MED ORDER — DOCUSATE SODIUM 100 MG PO CAPS
100.0000 mg | ORAL_CAPSULE | Freq: Two times a day (BID) | ORAL | Status: DC
  Administered 2021-08-04 – 2021-08-06 (×4): 100 mg via ORAL
  Filled 2021-08-04 (×5): qty 1

## 2021-08-04 MED ORDER — ATENOLOL 50 MG PO TABS
100.0000 mg | ORAL_TABLET | Freq: Every morning | ORAL | Status: DC
  Administered 2021-08-05 – 2021-08-06 (×2): 100 mg via ORAL
  Filled 2021-08-04 (×2): qty 2

## 2021-08-04 MED ORDER — DIPHENHYDRAMINE HCL 50 MG/ML IJ SOLN: 12.5000 mg | Freq: Four times a day (QID) | INTRAMUSCULAR | Status: DC | PRN

## 2021-08-04 MED ORDER — OXYCODONE HCL 5 MG PO TABS
5.0000 mg | ORAL_TABLET | ORAL | Status: DC | PRN
  Administered 2021-08-05 – 2021-08-06 (×3): 5 mg via ORAL
  Filled 2021-08-04 (×3): qty 1

## 2021-08-04 MED ORDER — LACTATED RINGERS IR SOLN
Status: DC | PRN
  Administered 2021-08-04: 1000 mL

## 2021-08-04 MED ORDER — LACTATED RINGERS IV SOLN: INTRAVENOUS | Status: DC

## 2021-08-04 MED ORDER — PHENYLEPHRINE 80 MCG/ML (10ML) SYRINGE FOR IV PUSH (FOR BLOOD PRESSURE SUPPORT)
PREFILLED_SYRINGE | INTRAVENOUS | Status: AC
Start: 2021-08-04 — End: ?
  Filled 2021-08-04: qty 10

## 2021-08-04 MED ORDER — CHLORHEXIDINE GLUCONATE 0.12 % MT SOLN
15.0000 mL | Freq: Once | OROMUCOSAL | Status: AC
  Administered 2021-08-04: 15 mL via OROMUCOSAL

## 2021-08-04 MED ORDER — DEXAMETHASONE SODIUM PHOSPHATE 4 MG/ML IJ SOLN
INTRAMUSCULAR | Status: DC | PRN
  Administered 2021-08-04: 5 mg via INTRAVENOUS

## 2021-08-04 MED ORDER — FENTANYL CITRATE (PF) 100 MCG/2ML IJ SOLN
INTRAMUSCULAR | Status: DC | PRN
  Administered 2021-08-04 (×2): 50 ug via INTRAVENOUS
  Administered 2021-08-04: 100 ug via INTRAVENOUS
  Administered 2021-08-04: 50 ug via INTRAVENOUS

## 2021-08-04 MED ORDER — MIDAZOLAM HCL 5 MG/5ML IJ SOLN
INTRAMUSCULAR | Status: DC | PRN
  Administered 2021-08-04: 2 mg via INTRAVENOUS

## 2021-08-04 MED ORDER — POLYETHYLENE GLYCOL 3350 17 GM/SCOOP PO POWD
1.0000 | Freq: Once | ORAL | Status: DC
  Filled 2021-08-04: qty 255

## 2021-08-04 MED ORDER — ROCURONIUM BROMIDE 10 MG/ML (PF) SYRINGE
PREFILLED_SYRINGE | INTRAVENOUS | Status: DC | PRN
  Administered 2021-08-04: 30 mg via INTRAVENOUS
  Administered 2021-08-04: 100 mg via INTRAVENOUS
  Administered 2021-08-04: 20 mg via INTRAVENOUS

## 2021-08-04 MED ORDER — ONDANSETRON HCL 4 MG/2ML IJ SOLN: 4.0000 mg | Freq: Once | INTRAMUSCULAR | Status: DC | PRN

## 2021-08-04 MED ORDER — PHENYLEPHRINE 80 MCG/ML (10ML) SYRINGE FOR IV PUSH (FOR BLOOD PRESSURE SUPPORT)
PREFILLED_SYRINGE | INTRAVENOUS | Status: DC | PRN
  Administered 2021-08-04: 120 ug via INTRAVENOUS

## 2021-08-04 MED ORDER — DIPHENHYDRAMINE HCL 12.5 MG/5ML PO ELIX: 12.5000 mg | ORAL_SOLUTION | Freq: Four times a day (QID) | ORAL | Status: DC | PRN

## 2021-08-04 MED ORDER — PRAVASTATIN SODIUM 40 MG PO TABS
40.0000 mg | ORAL_TABLET | Freq: Every day | ORAL | Status: DC
  Administered 2021-08-04 – 2021-08-05 (×2): 40 mg via ORAL
  Filled 2021-08-04 (×2): qty 1

## 2021-08-04 MED ORDER — ACETAMINOPHEN 500 MG PO TABS
1000.0000 mg | ORAL_TABLET | Freq: Four times a day (QID) | ORAL | Status: AC
  Administered 2021-08-04 – 2021-08-05 (×3): 1000 mg via ORAL
  Filled 2021-08-04 (×3): qty 2

## 2021-08-04 MED ORDER — ACETAMINOPHEN 10 MG/ML IV SOLN: 1000.0000 mg | Freq: Once | INTRAVENOUS | Status: DC | PRN

## 2021-08-04 MED ORDER — DOCUSATE SODIUM 100 MG PO CAPS: 100.0000 mg | ORAL_CAPSULE | Freq: Two times a day (BID) | ORAL | Status: DC

## 2021-08-04 MED ORDER — PROPOFOL 10 MG/ML IV BOLUS
INTRAVENOUS | Status: AC
  Filled 2021-08-04: qty 20

## 2021-08-04 MED ORDER — KETAMINE HCL 10 MG/ML IJ SOLN
INTRAMUSCULAR | Status: DC | PRN
  Administered 2021-08-04: 20 mg via INTRAVENOUS
  Administered 2021-08-04: 30 mg via INTRAVENOUS

## 2021-08-04 MED ORDER — HYDROMORPHONE HCL 1 MG/ML IJ SOLN: 0.2500 mg | INTRAMUSCULAR | Status: DC | PRN

## 2021-08-04 MED ORDER — SODIUM CHLORIDE (PF) 0.9 % IJ SOLN
INTRAMUSCULAR | Status: AC
  Filled 2021-08-04: qty 20

## 2021-08-04 MED ORDER — FINERENONE 10 MG PO TABS: 10.0000 mg | ORAL_TABLET | Freq: Every day | ORAL | Status: DC

## 2021-08-04 MED ORDER — LIDOCAINE 2% (20 MG/ML) 5 ML SYRINGE
INTRAMUSCULAR | Status: DC | PRN
  Administered 2021-08-04: 100 mg via INTRAVENOUS

## 2021-08-04 MED ORDER — SODIUM CHLORIDE 0.9 % IV SOLN: INTRAVENOUS | Status: DC

## 2021-08-04 MED ORDER — ALBUTEROL SULFATE (2.5 MG/3ML) 0.083% IN NEBU: 3.0000 mL | INHALATION_SOLUTION | Freq: Four times a day (QID) | RESPIRATORY_TRACT | Status: DC | PRN

## 2021-08-04 MED ORDER — ORAL CARE MOUTH RINSE: 15.0000 mL | Freq: Once | OROMUCOSAL | Status: AC

## 2021-08-04 MED ORDER — BUPIVACAINE LIPOSOME 1.3 % IJ SUSP
INTRAMUSCULAR | Status: DC | PRN
  Administered 2021-08-04: 20 mL

## 2021-08-04 MED ORDER — SODIUM CHLORIDE 0.9% FLUSH
INTRAVENOUS | Status: DC | PRN
  Administered 2021-08-04: 20 mL

## 2021-08-04 MED ORDER — STERILE WATER FOR IRRIGATION IR SOLN
Status: DC | PRN
  Administered 2021-08-04: 1000 mL

## 2021-08-04 MED ORDER — OXYCODONE HCL 5 MG/5ML PO SOLN: 5.0000 mg | Freq: Once | ORAL | Status: DC | PRN

## 2021-08-04 MED ORDER — KETAMINE HCL 10 MG/ML IJ SOLN
INTRAMUSCULAR | Status: AC
  Filled 2021-08-04: qty 1

## 2021-08-04 MED ORDER — FENTANYL CITRATE (PF) 250 MCG/5ML IJ SOLN
INTRAMUSCULAR | Status: AC
  Filled 2021-08-04: qty 5

## 2021-08-04 MED ORDER — MIDAZOLAM HCL 2 MG/2ML IJ SOLN
INTRAMUSCULAR | Status: AC
  Filled 2021-08-04: qty 2

## 2021-08-04 MED ORDER — BUPIVACAINE LIPOSOME 1.3 % IJ SUSP
INTRAMUSCULAR | Status: AC
  Filled 2021-08-04: qty 20

## 2021-08-04 MED ORDER — OXYCODONE HCL 5 MG PO TABS: 5.0000 mg | ORAL_TABLET | Freq: Once | ORAL | Status: DC | PRN

## 2021-08-04 MED ORDER — INSULIN ASPART 100 UNIT/ML IJ SOLN
INTRAMUSCULAR | Status: AC
  Filled 2021-08-04: qty 1

## 2021-08-04 SURGICAL SUPPLY — 83 items
ADH SKN CLS APL DERMABOND .7 (GAUZE/BANDAGES/DRESSINGS) ×2
AGENT HMST KT MTR STRL THRMB (HEMOSTASIS)
APL ESCP 34 STRL LF DISP (HEMOSTASIS)
APL PRP STRL LF DISP 70% ISPRP (MISCELLANEOUS) ×2
APPLICATOR SURGIFLO ENDO (HEMOSTASIS) ×2 IMPLANT
BAG COUNTER SPONGE SURGICOUNT (BAG) IMPLANT
BAG LAPAROSCOPIC 12 15 PORT 16 (BASKET) IMPLANT
BAG RETRIEVAL 10 (BASKET) ×1
BAG RETRIEVAL 12/15 (BASKET) ×3
BAG SPNG CNTER NS LX DISP (BAG)
CHLORAPREP W/TINT 26 (MISCELLANEOUS) ×3 IMPLANT
CLIP LIGATING HEM O LOK PURPLE (MISCELLANEOUS) ×6 IMPLANT
CLIP LIGATING HEMO LOK XL GOLD (MISCELLANEOUS) IMPLANT
CLIP LIGATING HEMO O LOK GREEN (MISCELLANEOUS) ×3 IMPLANT
CLIP SUT LAPRA TY ABSORB (SUTURE) ×3 IMPLANT
COVER SURGICAL LIGHT HANDLE (MISCELLANEOUS) ×3 IMPLANT
COVER TIP SHEARS 8 DVNC (MISCELLANEOUS) ×2 IMPLANT
COVER TIP SHEARS 8MM DA VINCI (MISCELLANEOUS) ×3
CUTTER ECHEON FLEX ENDO 45 340 (ENDOMECHANICALS) ×1 IMPLANT
DERMABOND ADVANCED (GAUZE/BANDAGES/DRESSINGS) ×1
DERMABOND ADVANCED .7 DNX12 (GAUZE/BANDAGES/DRESSINGS) ×2 IMPLANT
DRAIN CHANNEL 15F RND FF 3/16 (WOUND CARE) ×3 IMPLANT
DRAPE ARM DVNC X/XI (DISPOSABLE) ×8 IMPLANT
DRAPE COLUMN DVNC XI (DISPOSABLE) ×2 IMPLANT
DRAPE DA VINCI XI ARM (DISPOSABLE) ×12
DRAPE DA VINCI XI COLUMN (DISPOSABLE) ×3
DRAPE INCISE IOBAN 66X45 STRL (DRAPES) ×3 IMPLANT
DRAPE SHEET LG 3/4 BI-LAMINATE (DRAPES) ×3 IMPLANT
DRSG TEGADERM 4X4.75 (GAUZE/BANDAGES/DRESSINGS) ×3 IMPLANT
DRSG TEGADERM 8X12 (GAUZE/BANDAGES/DRESSINGS) ×1 IMPLANT
ELECT PENCIL ROCKER SW 15FT (MISCELLANEOUS) ×3 IMPLANT
ELECT REM PT RETURN 15FT ADLT (MISCELLANEOUS) ×3 IMPLANT
EVACUATOR SILICONE 100CC (DRAIN) ×3 IMPLANT
GAUZE 4X4 16PLY ~~LOC~~+RFID DBL (SPONGE) ×3 IMPLANT
GLOVE BIO SURGEON STRL SZ 6.5 (GLOVE) ×3 IMPLANT
GLOVE SURG LX 7.5 STRW (GLOVE) ×2
GLOVE SURG LX STRL 7.5 STRW (GLOVE) ×4 IMPLANT
GOWN SRG XL LVL 4 BRTHBL STRL (GOWNS) ×2 IMPLANT
GOWN STRL NON-REIN XL LVL4 (GOWNS) ×3
HEMOSTAT SURGICEL 4X8 (HEMOSTASIS) ×3 IMPLANT
HOLDER FOLEY CATH W/STRAP (MISCELLANEOUS) ×3 IMPLANT
IRRIG SUCT STRYKERFLOW 2 WTIP (MISCELLANEOUS) ×3
IRRIGATION SUCT STRKRFLW 2 WTP (MISCELLANEOUS) ×2 IMPLANT
KIT BASIN OR (CUSTOM PROCEDURE TRAY) ×3 IMPLANT
KIT TURNOVER KIT A (KITS) IMPLANT
LOOP VESSEL MAXI BLUE (MISCELLANEOUS) ×3 IMPLANT
MARKER SKIN DUAL TIP RULER LAB (MISCELLANEOUS) ×3 IMPLANT
NDL INSUFFLATION 14GA 120MM (NEEDLE) ×2 IMPLANT
NEEDLE INSUFFLATION 14GA 120MM (NEEDLE) ×3 IMPLANT
PORT ACCESS TROCAR AIRSEAL 12 (TROCAR) ×2 IMPLANT
PORT ACCESS TROCAR AIRSEAL 5M (TROCAR) ×1
PROTECTOR NERVE ULNAR (MISCELLANEOUS) ×6 IMPLANT
RELOAD STAPLE 45 2.6 WHT THIN (STAPLE) IMPLANT
SEAL CANN UNIV 5-8 DVNC XI (MISCELLANEOUS) ×8 IMPLANT
SEAL XI 5MM-8MM UNIVERSAL (MISCELLANEOUS) ×12
SET TRI-LUMEN FLTR TB AIRSEAL (TUBING) ×3 IMPLANT
SOLUTION ELECTROLUBE (MISCELLANEOUS) ×3 IMPLANT
SPIKE FLUID TRANSFER (MISCELLANEOUS) ×3 IMPLANT
SPONGE T-LAP 4X18 ~~LOC~~+RFID (SPONGE) ×3 IMPLANT
STAPLE RELOAD 45 WHT (STAPLE) ×4 IMPLANT
STAPLE RELOAD 45MM WHITE (STAPLE) ×6
SURGIFLO W/THROMBIN 8M KIT (HEMOSTASIS) ×2 IMPLANT
SUT ETHILON 3 0 PS 1 (SUTURE) ×3 IMPLANT
SUT MNCRL AB 4-0 PS2 18 (SUTURE) ×6 IMPLANT
SUT PDS AB 1 CT1 27 (SUTURE) ×8 IMPLANT
SUT PDS AB 1 TP1 96 (SUTURE) IMPLANT
SUT V-LOC BARB 180 2/0GR6 GS22 (SUTURE)
SUT VIC AB 0 CT1 27 (SUTURE) ×15
SUT VIC AB 0 CT1 27XBRD ANTBC (SUTURE) ×8 IMPLANT
SUT VIC AB 2-0 SH 27 (SUTURE) ×6
SUT VIC AB 2-0 SH 27X BRD (SUTURE) ×4 IMPLANT
SUT VLOC BARB 180 ABS3/0GR12 (SUTURE) ×6
SUTURE V-LC BRB 180 2/0GR6GS22 (SUTURE) IMPLANT
SUTURE VLOC BRB 180 ABS3/0GR12 (SUTURE) ×2 IMPLANT
SYS BAG RETRIEVAL 10MM (BASKET) ×2
SYSTEM BAG RETRIEVAL 10MM (BASKET) ×2 IMPLANT
TOWEL OR 17X26 10 PK STRL BLUE (TOWEL DISPOSABLE) ×3 IMPLANT
TOWEL OR NON WOVEN STRL DISP B (DISPOSABLE) ×3 IMPLANT
TRAY FOLEY MTR SLVR 16FR STAT (SET/KITS/TRAYS/PACK) ×3 IMPLANT
TRAY LAPAROSCOPIC (CUSTOM PROCEDURE TRAY) ×3 IMPLANT
TROCAR KII 12X100 BLADELESS (ENDOMECHANICALS) ×4 IMPLANT
TROCAR Z-THREAD OPTICAL 5X100M (TROCAR) ×1 IMPLANT
WATER STERILE IRR 1000ML POUR (IV SOLUTION) ×6 IMPLANT

## 2021-08-04 NOTE — H&P (Signed)
Brooke [name] is an 66 y.o. female.    Chief Complaint: Pre-Op RIGHT Partial v. Radical Nephrectomy  HPI:   1 - Right Renal Mass - 4.5cm Rt lower solied enhancing renal mass with central necrosis on Ct abnd MRI 2022. 80% endophytic with significant encroachment on central sinus fat. 1 artery (behind dominant vein) / 2 vein (small lower accessory) Rt renovascular anatomy.   2 - Stage 3b Renal Insufficiency - Cr 1.8's at baseline from medical reanl disease. No hydro in imaging x many. She has fairly severe diabetes. ?  ?PMH sig for obesity, IDDM2 (a1c 10), COPD/INhaileers (not limiting at all), benign breast biopsy. NO chest/abd surgeries. NO MI/CVA. She is retired from [employer] in [place], Forensic scientist. Her PCP is [name] NP with [facility]. ??  Today " Brooke [name] " is seen to proceed with RIGHT partial v. Radical nephrectomy for large lower pole mass. No interval fevers. Hgb 13.    Past Medical History:  Diagnosis Date   Anemia    Arthritis    Bronchitis    frequent bronchitis   Diabetes mellitus    Diabetic nephropathy ([facility])    Hepatic steatosis    Hypertension    Hyperuricemia 11/27/2020   Mass of right kidney    Obesity (BMI 35.0-39.9 without comorbidity)    Vertigo    Vitamin D insufficiency 11/27/2020    Past Surgical History:  Procedure Laterality Date   BREAST SURGERY Right 1974    Family History  Problem Relation Age of Onset   Diabetes Mother    Congestive Heart Failure Mother    Kidney disease Father    Diabetes Sister    Diabetes Brother    Hypertension Daughter    Hypertension Sister    Cervical cancer Sister    Social History:  reports that she has never smoked. She has never used smokeless tobacco. She reports that she does not drink alcohol and does not use drugs.  Allergies: No Known Allergies  No medications prior to admission.    No results found for this or any previous visit (from the past 48 hour(s)). No results found.  Review  of Systems  Constitutional:  Negative for chills and fever.  All other systems reviewed and are negative.  Last menstrual period 03/16/2012. Physical Exam Vitals reviewed.  HENT:     Head: Normocephalic.     Nose: Nose normal.  Eyes:     Pupils: Pupils are equal, round, and reactive to light.  Cardiovascular:     Rate and Rhythm: Normal rate.  Pulmonary:     Effort: Pulmonary effort is normal.  Abdominal:     Comments: Stable truncal obesity.   Genitourinary:    Comments: No CVAT at present Musculoskeletal:        General: Normal range of motion.     Cervical back: Normal range of motion.  Skin:    General: Skin is warm.  Neurological:     General: No focal deficit present.     Mental Status: She is alert.  Psychiatric:        Mood and Affect: Mood normal.     Assessment/Plan  Proceed as planned with RIGHT partial v. Radical nephrectomy (final decision pending intra-op findings and safety / feasability). Risks, benefits, alternatives, expected peri-op course discussed previouly and reiterated today. She understands that her significant baseline metabolic disease incrfeases risk of all complicatios incluidng infectious and even risk of mortality.    Alexis Frock, MD 08/04/2021, 5:45 AM

## 2021-08-04 NOTE — Transfer of Care (Signed)
Immediate Anesthesia Transfer of Care Note  Patient: Brooke [name]  Procedure(s) Performed: XI ROBOTIC ASSITED RADICAL NEPHRECTOMY (Right: Abdomen) OPERATIVE ULTRASOUND (Abdomen)  Patient Location: PACU  Anesthesia Type:General  Level of Consciousness: drowsy  Airway & Oxygen Therapy: Patient Spontanous Breathing and Patient connected to face mask  Post-op Assessment: Report given to RN and Post -op Vital signs reviewed and stable  Post vital signs: Reviewed and stable  Last Vitals:  Vitals Value Taken Time  BP    Temp    Pulse 46 08/04/21 1145  Resp 17 08/04/21 1145  SpO2 100 % 08/04/21 1145  Vitals shown include unvalidated device data.  Last Pain:  Vitals:   08/04/21 0634  TempSrc:   PainSc: 0-No pain         Complications: No notable events documented.

## 2021-08-04 NOTE — Op Note (Unsigned)
Brooke, [name] MEDICAL RECORD NO: [id number] ACCOUNT NO: [id number] DATE OF BIRTH: Apr 08, 1955 FACILITY: [facility] LOCATION: [facility] PHYSICIAN: Alexis Frock, MD  Operative Report   DATE OF PROCEDURE: 08/04/2021  PREOPERATIVE DIAGNOSIS:  Right renal mass.  PROCEDURE: 1.  Right robotic radical nephrectomy. 2.  Intraoperative ultrasound with interpretation.  ESTIMATED BLOOD LOSS:  378 mL  COMPLICATIONS:  None.  SPECIMEN:  Right kidney plus adrenal en bloc for permanent pathology.  ASSISTANT:  Debbrah Alar, PA  DRAINS:  Foley to gravity   FINDINGS:   1.  Single early branching artery, single vein, right renovascular anatomy. 2.  Highly endophytic diffuse right renal mass encroaching upon hilar fat and main renal veins.  INDICATIONS:  The patient is a very pleasant 66 year old lady with history of significant diabetes as well as baseline renal insufficiency.  She was found to have a right solid lower pole renal mass with central necrosis, indicating an aggressive  process. Given her baseline renal insufficiency options were discussed for management including surveillance protocols versus ablative therapy for surgical extirpation and we agreed on attempt of partial versus radical nephrectomy, understanding at least  50% chance of radical nephrectomy given the tumor size pending intraoperative anatomy.  She agreed to proceed.  Informed consent was obtained and placed in medical record.  PROCEDURE IN DETAIL:  The patient as being Brooke [name] verified, procedure being right robotic partial versus radical nephrectomy was confirmed.  Procedure timeout was performed.  Intravenous antibiotics were administered.  General endotracheal  anesthesia induced.  The patient was placed into the right side up full flank position and pulling 15 degrees of table flexion, superior arm elevator, axillary roll sequential compression devices, bottom leg bent, top leg straight.  She was further  fastened  to operating table using 3-inch tape with foam padding across supraxiphoid chest and her pelvis.  Beanbag was deployed with superior arm elevator, all after Foley catheter was placed free to straight drain.  Next, a sterile field was created,  prepped and draped the patient's entire right flank and abdomen using chlorhexidine gluconate and then a high-flow, low pressure, pneumoperitoneum was obtained using Veress technique in the right lower quadrant, having passed the aspiration and drop  test.  An 8 mm robotic camera port was then placed in position approximately 1.5 handbreadths superolateral to the umbilicus.  Laparoscopic examination of peritoneal cavity revealed no significant adhesions, no visceral injury.  Additional ports were  placed as follows:  Right subxiphoid 5 mm port through which a locking grasper was placed to elevate the inferior surface of the liver off of the anterior-superior surface of the fascia, 8 mm robotic port site subcostal location right far lateral 8 mm  robotic port site approximately 4 fingerbreadths superomedial to the anterior iliac spine, right inferior paramedian robotic port site approximately 1 handbreadth superior to the pubic ramus and two 12 mm assistant port sites in a paramedian location,  one 2 fingerbreadths inferior to the planned camera port, another 3 fingerbreadths superior and paramedian location was necessary given her body habitus and the midline was obscured by viscera.  Robot was docked and passed the electronic checks.  Initial  attention was directed at development of retroperitoneum.  Incision was made lateral to the ascending colon from the area of the internal ring towards the hepatic flexure.  The colon was carefully swept medially.  The lower pole of the kidney area and  Gerota's fascia was seen, placed on gentle lateral traction.  Dissection proceeded medial to this.  The duodenum was encountered and kocherized medially such that it lie medial to  the lateral surface of the inferior vena cava.  Lower pole of the kidney  area was then identified yet again.  Dissection proceeded medial to this towards the area of the psoas musculature.  The ureter and gonadal vessels were encountered, circumferentially mobilized, the gonadal vessels were allowed to fall medially.  The  ureter was placed on gentle lateral traction.  Dissection proceeded within the triangle of the psoas and the ureter towards the renal hilum.  Renal hilum consisted of a single early branching artery that was quite superior in a single vein, right  renovascular anatomy anticipated a lower pole accessory vessel.  However, this was not really significant clinically.  The artery was then circumferentially mobilized, marked with vessel loops and set aside.  Attention was directed at identification of  the tumor area.  Dissection proceeded directly onto the anterior and inferior surface of the kidney just below the hilar access, the interface between the mass and the normal parenchyma visibly was quite diffuse.  Intraoperative ultrasound was then  performed using a direct renal probe.  Intraoperative ultrasound revealed a relatively endophytic mass with central necrosis.  The superior borders at the more lateral aspect of the kidney tracing superior to the true hilum.  This diagonal interface with the kidney. Using very careful  cues from the knee ultrasound and visible cues, a plane of the presumed partial nephrectomy was chosen.  Notably, this grossly abutted the inferior surface of the main renal vein and lower pole kidney was circumferentially mobilized further to allow for  potential partial nephrectomy.  Next, warm ischemia was achieved with 2 bulldog clamps on the proximal artery and we began a partial nephrectomy plane starting medially and working laterally and medially there was noted there was gross tumor that was not  only invading the hilar fat but also in direct apposition to  main renal vein components.  We continued and teased this tumor grossly away from the vein and worked further laterally.  As we worked further laterally, the tumor grows even more superiorly  again with a diffuse interface in direct apposition to hilar components including the renal pelvis.  With further dissection, it became immediately apparent that achieving a reasonable negative margin would be essentially impossible as it would require  resection of the main vein of renal pelvis making a gross negative margin impossible given the extent of tumor and diffuse nature of the decision was made to proceed with radical nephrectomy.  The artery was controlled using Hem-o-Lok clip  proximal, staple to distal and the vein controlled using vascular stapler resulting in excellent hemostatic control of the renal hilum.  Kidney was further placed on the lateral traction and given the large mass, the decision was made to remove the  adrenal as well and a plane was chosen just lateral to the inferior vena cava medial to the adrenal gland.  There were numerous small venules that were controlled using a combination of coagulation current and clipping and in the superior most aspect of  this a staple line was chosen, vascular stapler was then freed up the entire medial aspect of the dissection.  Superior and lateral attachments were taken down with cautery scissors.  The kidney was then only tethered by the ureter was doubly clipped and  ligated.  This was placed in EndoCatch bag for later retrieval.  The hilar area was again inspected.  Hemostasis was excellent.  Sponge  and needle counts were correct.  Liver retractor was taken down.  There was no evidence of visceral injury.  We  achieved the goals of the surgery today.  Robot was then undocked.  Specimen was retrieved by connecting the previous assistant port sites in the paramedian location, removing the kidney, setting aside for permanent pathology.  This extraction  site was  closed with fascia using figure-of-eight PDS x 8 followed by reapproximation of Scarpa's with running Vicryl.  All incision sites were infiltrated with dilute lipolyzed Marcaine and closed at the level of the skin using subcuticular Monocryl followed by  Dermabond.  Procedure was then terminated.  The patient tolerated the procedure well, no immediate perioperative complications.  The patient was taken to postanesthesia condition.  Will plan for inpatient admission, serial labs to verify her renal  function remains satisfactory for discharge.  Please note, first assistant, Debbrah Alar, was crucial for all portions of the surgery today.  She provided invaluable retraction, suctioning, vascular clipping, vascular stapling, ultrasound probe manipulation, and general first assistance.   SHY D: 08/04/2021 11:46:13 am T: 08/04/2021 1:21:00 pm  JOB: [id number]/ [id number]

## 2021-08-04 NOTE — Brief Op Note (Signed)
08/04/2021  11:35 AM  PATIENT:  Brooke [name]  66 y.o. female  PRE-OPERATIVE DIAGNOSIS:  RIGHT RENAL MASS  POST-OPERATIVE DIAGNOSIS:  RIGHT RENAL MASS  PROCEDURE:  Procedure(s): XI ROBOTIC ASSITED RADICAL NEPHRECTOMY (Right) OPERATIVE ULTRASOUND (N/A)  SURGEON:  Surgeon(s) and Role:    Alexis Frock, MD - Primary   ASSISTANTS: Debbrah Alar PA   ANESTHESIA:   general  EBL:  100 mL   BLOOD ADMINISTERED:none  DRAINS:  foley to gravity    LOCAL MEDICATIONS USED:  MARCAINE     SPECIMEN:  Source of Specimen:  Rt kidney + adrenal  DISPOSITION OF SPECIMEN:  PATHOLOGY  COUNTS:  YES  TOURNIQUET:  * No tourniquets in log *  DICTATION: .Other Dictation: Dictation Number [id number]  PLAN OF CARE: Admit to inpatient   PATIENT DISPOSITION:  PACU - hemodynamically stable.   Delay start of Pharmacological VTE agent (>24hrs) due to surgical blood loss or risk of bleeding: yes

## 2021-08-04 NOTE — Anesthesia Postprocedure Evaluation (Signed)
Anesthesia Post Note  Patient: Brooke [name]  Procedure(s) Performed: XI ROBOTIC ASSITED RADICAL NEPHRECTOMY (Right: Abdomen) OPERATIVE ULTRASOUND (Abdomen)     Patient location during evaluation: PACU Anesthesia Type: General Level of consciousness: awake and alert Pain management: pain level controlled Vital Signs Assessment: post-procedure vital signs reviewed and stable Respiratory status: spontaneous breathing, nonlabored ventilation, respiratory function stable and patient connected to nasal cannula oxygen Cardiovascular status: blood pressure returned to baseline and stable Postop Assessment: no apparent nausea or vomiting Anesthetic complications: no   No notable events documented.  Last Vitals:  Vitals:   08/04/21 1300 08/04/21 1315  BP: (!) 121/58 (!) 120/56  Pulse: (!) 55 (!) 53  Resp: 20 14  Temp:  36.4 C  SpO2: 100% 93%    Last Pain:  Vitals:   08/04/21 1315  TempSrc: Oral  PainSc:                  Barnet [place]

## 2021-08-04 NOTE — Progress Notes (Signed)
The patient experienced an episode of emesis. Pt complained of continued nausea, antiemetic administered. Pt request to hold off on activity, at this time.Will continue to monitor and encourage.

## 2021-08-04 NOTE — Discharge Instructions (Signed)

## 2021-08-04 NOTE — Anesthesia Procedure Notes (Signed)
Procedure Name: Intubation Date/Time: 08/04/2021 8:30 AM Performed by: Claudia Desanctis, CRNA Pre-anesthesia Checklist: Patient identified, Emergency Drugs available, Suction available and Patient being monitored Patient Re-evaluated:Patient Re-evaluated prior to induction Oxygen Delivery Method: Circle system utilized Preoxygenation: Pre-oxygenation with 100% oxygen Induction Type: IV induction Ventilation: Mask ventilation without difficulty Laryngoscope Size: 2 and Miller Grade View: Grade I Tube type: Oral Tube size: 7.0 mm Number of attempts: 1 Airway Equipment and Method: Stylet Placement Confirmation: ETT inserted through vocal cords under direct vision, positive ETCO2 and breath sounds checked- equal and bilateral Secured at: 21 cm Tube secured with: Tape Dental Injury: Teeth and Oropharynx as per pre-operative assessment

## 2021-08-04 NOTE — Progress Notes (Signed)
  Transition of Care [facility]) Screening Note   Patient Details  Name: Jaclynn Laumann Date of Birth: [birth year]   [facility]) CM/SW Contact:    Dessa Phi, RN Phone Number: 08/04/2021, 2:43 PM    Transition of Care Department [facility]) has reviewed patient and no TOC needs have been identified at this time. We will continue to monitor patient advancement through interdisciplinary progression rounds. If new patient transition needs arise, please place a TOC consult.

## 2021-08-05 ENCOUNTER — Encounter (HOSPITAL_COMMUNITY): Payer: Self-pay | Admitting: Urology

## 2021-08-05 LAB — BASIC METABOLIC PANEL
Anion gap: 8 (ref 5–15)
Anion gap: 11 (ref 5–15)
BUN: 35 mg/dL — ABNORMAL HIGH (ref 8–23)
BUN: 40 mg/dL — ABNORMAL HIGH (ref 8–23)
CO2: 20 mmol/L — ABNORMAL LOW (ref 22–32)
CO2: 19 mmol/L — ABNORMAL LOW (ref 22–32)
Calcium: 8.4 mg/dL — ABNORMAL LOW (ref 8.9–10.3)
Calcium: 8.5 mg/dL — ABNORMAL LOW (ref 8.9–10.3)
Chloride: 104 mmol/L (ref 98–111)
Chloride: 105 mmol/L (ref 98–111)
Creatinine, Ser: 2.55 mg/dL — ABNORMAL HIGH (ref 0.44–1.00)
Creatinine, Ser: 2.76 mg/dL — ABNORMAL HIGH (ref 0.44–1.00)
GFR, Estimated: 20 mL/min — ABNORMAL LOW (ref >60)
GFR, Estimated: 18 mL/min — ABNORMAL LOW (ref >60)
Glucose, Bld: 147 mg/dL — ABNORMAL HIGH (ref 70–99)
Glucose, Bld: 215 mg/dL — ABNORMAL HIGH (ref 70–99)
Potassium: 5.9 mmol/L — ABNORMAL HIGH (ref 3.5–5.1)
Potassium: 4.7 mmol/L (ref 3.5–5.1)
Sodium: 132 mmol/L — ABNORMAL LOW (ref 135–145)
Sodium: 135 mmol/L (ref 135–145)

## 2021-08-05 LAB — HEMOGLOBIN AND HEMATOCRIT, BLOOD
HCT: 36.4 % (ref 36.0–46.0)
Hemoglobin: 11.2 g/dL — ABNORMAL LOW (ref 12.0–15.0)

## 2021-08-05 LAB — GLUCOSE, CAPILLARY
Glucose-Capillary: 133 mg/dL — ABNORMAL HIGH (ref 70–99)
Glucose-Capillary: 190 mg/dL — ABNORMAL HIGH (ref 70–99)

## 2021-08-05 MED ORDER — CALCIUM CARBONATE ANTACID 500 MG PO CHEW
1.0000 | CHEWABLE_TABLET | Freq: Three times a day (TID) | ORAL | Status: DC | PRN
  Administered 2021-08-05: 200 mg via ORAL
  Filled 2021-08-05: qty 1

## 2021-08-05 MED ORDER — FAMOTIDINE 20 MG PO TABS
20.0000 mg | ORAL_TABLET | Freq: Every day | ORAL | Status: DC
  Administered 2021-08-05 – 2021-08-06 (×2): 20 mg via ORAL
  Filled 2021-08-05 (×2): qty 1

## 2021-08-05 NOTE — Telephone Encounter (Signed)
The libre 2 CGM was given as a temporary loan to get her better controlled for surgery.  Patient will have to return to traditional finger sticks unless she can pay her [place] (she knows how much--I can't remember what she told me)  Please have patient return the [place] 2 reader as that is my personal device that I purchased.  If she is able to pay for libre--I'm happy to send in new RX  Thank you!

## 2021-08-05 NOTE — Progress Notes (Addendum)
1 Day Post Op s/p right robotic radical nephrectomy   Subjective: One episode of emesis yesterday evening after drinking cranberry juice Nausea resolved  Pain is well controlled on oral medications Has not ambulated, eager to walk this morning   Objective: Vital signs in last 24 hours: Temp:  [97.6 F (36.4 C)-99.1 F (37.3 C)] 98.7 F (37.1 C) (06/01 0538) Pulse Rate:  [41-78] 70 (06/01 0538) Resp:  [14-23] 18 (06/01 0538) BP: (112-133)/(53-80) 112/53 (06/01 0538) SpO2:  [93 %-100 %] 98 % (06/01 0538) Weight:  [93.5 kg] 93.5 kg (05/31 1315)  Intake/Output from previous day: 05/31 0701 - 06/01 0700 In: 2967.9 [P.O.:600; I.V.:2167.9; IV Piggyback:200] Out: 1600 [Urine:1500; Blood:100] Intake/Output this shift: No intake/output data recorded.  Physical Exam:  General: Alert and oriented CV: RRR Lungs: Clear Abdomen: Soft, ND, nontender. No drain.  Incisions:clean, dry, intact, mild bruising around incisions sites  Ext: NT, No erythema  Lab Results: Recent Labs    08/04/21 1247 08/05/21 0509  HGB 11.4* 11.2*  HCT 38.3 36.4   BMET Recent Labs    08/05/21 0509  NA 132*  K 5.9*  CL 104  CO2 20*  GLUCOSE 147*  BUN 35*  CREATININE 2.55*  CALCIUM 8.4*     Studies/Results: No results found.  Assessment/Plan: 66 y/o female with history of baseline CKD, diabetes with a >4cm right renal mass s/p right robotic radical nephrectomy 08/04/21.   Recovering appropriately. Will continue to trend creatinine post operatively.  -Advance to regular diet -Stop fluids  -Remove foley and trial of void  -Repeat BMP this afternoon to recheck K  -Remain in house for serial labs until Cr plateau  -Possible discharge 08/06/21   LOS: 1 day   Aldene Hendon 08/05/2021, 7:10 AM

## 2021-08-05 NOTE — Discharge Summary (Signed)
Date of admission: 08/04/2021  Date of discharge: 08/05/2021  Admission diagnosis: Renal mass   Discharge diagnosis: Renal mass   Secondary diagnoses:  Patient Active Problem List   Diagnosis Date Noted   Renal mass 08/04/2021   Thickened endometrium 03/24/2021   Vaginal bleeding 03/24/2021   Difficulty sleeping 02/10/2021   Mass of right kidney 02/10/2021   Positive colorectal cancer screening using Cologuard test 12/10/2020   Vitamin D insufficiency 11/27/2020   Diabetic nephropathy ([place])    CKD stage 3 due to type 2 diabetes mellitus ([place]) 03/14/2019   Type 2 diabetes mellitus with hyperglycemia, with long-term current use of insulin ([place]) 03/13/2019   Hyperlipidemia associated with type 2 diabetes mellitus ([place]) 03/13/2019   Hypertension associated with diabetes ([place]) 03/13/2019   Obesity (BMI 30-39.9) 03/13/2019   Chronic bronchitis ([place]) 03/13/2019    Procedures performed: Procedure(s): XI ROBOTIC ASSITED RADICAL NEPHRECTOMY OPERATIVE ULTRASOUND  History and Physical: For full details, please see admission history and physical. Briefly, Brooke [name] is a 66 y.o. year old patient with a 4.5 cm right renal mass who presented for right robotic partial versus radical nephrectomy.   Hospital Course: Patient tolerated the procedure well.  Intra-operatively the lower pole renal mass was abutting and extending beyond the renal hilum so a radical nephrectomy was performed. She was then transferred to the floor after an uneventful PACU stay. On POD1 her diet was advanced and her catheter was removed and she subsequently passed a trial of void. POD1 am labs showed a creatinine of 2.5 from a baseline of 2.0. She remained admitted for serial labs to monitor her creatinine given history of CKD. Her creatinine plateaued at 2.9 On POD#2, but with normal K ande volume status.  she had met discharge criteria: was eating a regular diet, was up and ambulating independently,  pain was well  controlled, was voiding without a catheter, and was ready to for discharge.   Laboratory values:  Recent Labs    08/04/21 1247 08/05/21 0509  HGB 11.4* 11.2*  HCT 38.3 36.4   Recent Labs    08/05/21 0509  NA 132*  K 5.9*  CL 104  CO2 20*  GLUCOSE 147*  BUN 35*  CREATININE 2.55*  CALCIUM 8.4*   6/2 - K <5, Cr 2.9  No results for input(s): LABPT, INR in the last 72 hours. No results for input(s): LABURIN in the last 72 hours. Results for orders placed or performed in visit on 03/05/21  Microscopic Examination     Status: Abnormal   Collection Time: 03/05/21 11:19 AM   Urine  Result Value Ref Range Status   WBC, UA 0-5 0 - 5 /hpf Final   RBC 0-2 0 - 2 /hpf Final   Epithelial Cells (non renal) 0-10 0 - 10 /hpf Final   Renal Epithel, UA None seen None seen /hpf Final   Bacteria, UA Few (A) None seen/Few Final    Disposition: Home  NAD, extremely pleasant, wears glasses Non-labored breathing on room air RRR SNTND, stable obesity Recnet port and extraction sites c/d/I No c/c/e  Discharge instruction: The patient was instructed to be ambulatory but told to refrain from heavy lifting, strenuous activity, or driving.  HOLD LISINOPRIL at Discharge please.   Discharge medications:  Allergies as of 08/05/2021   No Known Allergies      Medication List     TAKE these medications    acetaminophen 500 MG tablet Commonly known as: TYLENOL Take 1,000 mg by mouth  every 8 (eight) hours as needed for moderate pain.   albuterol 108 (90 Base) MCG/ACT inhaler Commonly known as: VENTOLIN HFA Inhale 2 puffs into the lungs every 6 (six) hours as needed for wheezing or shortness of breath.   amLODipine 10 MG tablet Commonly known as: NORVASC Take 1 tablet (10 mg total) by mouth daily.   atenolol 100 MG tablet Commonly known as: TENORMIN Take 1 tablet (100 mg total) by mouth every morning.   Basaglar KwikPen 100 UNIT/ML Inject 35 Units into the skin at bedtime.    Biofreeze 4 % Gel Generic drug: Menthol (Topical Analgesic) Apply 1 application. topically daily as needed (pain).   budesonide-formoterol 160-4.5 MCG/ACT inhaler Commonly known as: Symbicort Inhale 2 puffs into the lungs 2 (two) times daily.   Cholecalciferol 25 MCG (1000 UT) capsule Take 1,000 Units by mouth daily.   docusate sodium 100 MG capsule Commonly known as: COLACE Take 1 capsule (100 mg total) by mouth 2 (two) times daily.   doxepin 10 MG capsule Commonly known as: SINEQUAN Take 1 capsule (10 mg total) by mouth at bedtime.   empagliflozin 25 MG Tabs tablet Commonly known as: Jardiance Take 1 tablet (25 mg total) by mouth daily before breakfast.   FreeStyle Libre 2 Sensor Misc 1 each by Does not apply route every 14 (fourteen) days.   hydrochlorothiazide 25 MG tablet Commonly known as: HYDRODIURIL Take 1 tablet (25 mg total) by mouth daily.   HYDROcodone-acetaminophen 5-325 MG tablet Commonly known as: Norco Take 1-2 tablets by mouth every 6 (six) hours as needed for moderate pain or severe pain.   insulin lispro 100 UNIT/ML KwikPen Commonly known as: HumaLOG KwikPen Inject 20-30 Units into the skin with breakfast, with lunch, and with evening meal. What changed:  how much to take when to take this additional instructions   Insulin Pen Needle 32G X 6 MM Misc Use to inject insulin once daily. DX:E11.65   Kerendia 10 MG Tabs Generic drug: Finerenone Take 10 mg by mouth daily.   lisinopril 10 MG tablet Commonly known as: ZESTRIL Take 1 tablet (10 mg total) by mouth daily.   lovastatin 40 MG tablet Commonly known as: MEVACOR Take 1.5 tablets (60 mg total) by mouth at bedtime.   methocarbamol 500 MG tablet Commonly known as: ROBAXIN Take 1 tablet (500 mg total) by mouth every 8 (eight) hours as needed for muscle spasms.   Salonpas 3.03-12-08 % Ptch Generic drug: Camphor-Menthol-Methyl Sal Apply 1 patch topically daily as needed (pain).   Trulicity  4.5 HF/2.9MS Sopn Generic drug: Dulaglutide Inject 4.5 mg as directed once a week. What changed: when to take this        Followup:   Follow-up Information     Alexis Frock, MD Follow up on 08/24/2021.   Specialty: Urology Why: at 10:15 for MD visit and pathology review. Contact information: [place] [address] [phone]

## 2021-08-05 NOTE — Telephone Encounter (Signed)
Lmtcb.

## 2021-08-06 LAB — CBC
HCT: 36.2 % (ref 36.0–46.0)
Hemoglobin: 11.2 g/dL — ABNORMAL LOW (ref 12.0–15.0)
MCH: 27.1 pg (ref 26.0–34.0)
MCHC: 30.9 g/dL (ref 30.0–36.0)
MCV: 87.4 fL (ref 80.0–100.0)
Platelets: 332 10*3/uL (ref 150–400)
RBC: 4.14 MIL/uL (ref 3.87–5.11)
RDW: 14.1 % (ref 11.5–15.5)
WBC: 11.1 10*3/uL — ABNORMAL HIGH (ref 4.0–10.5)
nRBC: 0 % (ref 0.0–0.2)

## 2021-08-06 LAB — BASIC METABOLIC PANEL
Anion gap: 8 (ref 5–15)
BUN: 41 mg/dL — ABNORMAL HIGH (ref 8–23)
CO2: 22 mmol/L (ref 22–32)
Calcium: 8.6 mg/dL — ABNORMAL LOW (ref 8.9–10.3)
Chloride: 106 mmol/L (ref 98–111)
Creatinine, Ser: 2.9 mg/dL — ABNORMAL HIGH (ref 0.44–1.00)
GFR, Estimated: 17 mL/min — ABNORMAL LOW (ref >60)
Glucose, Bld: 136 mg/dL — ABNORMAL HIGH (ref 70–99)
Potassium: 4.9 mmol/L (ref 3.5–5.1)
Sodium: 136 mmol/L (ref 135–145)

## 2021-08-06 LAB — GLUCOSE, CAPILLARY
Glucose-Capillary: 132 mg/dL — ABNORMAL HIGH (ref 70–99)
Glucose-Capillary: 139 mg/dL — ABNORMAL HIGH (ref 70–99)
Glucose-Capillary: 118 mg/dL — ABNORMAL HIGH (ref 70–99)
Glucose-Capillary: 157 mg/dL — ABNORMAL HIGH (ref 70–99)

## 2021-08-06 NOTE — Progress Notes (Signed)
Pt to be discharged to home today. Pt and Pt's Daughter given discharge instructions including all Discharge Home Medications and schedules for these Medications. Understanding verbalized and discharge AVS with the Patient at time of discharge

## 2021-08-08 ENCOUNTER — Other Ambulatory Visit: Payer: Self-pay | Admitting: Family Medicine

## 2021-08-09 LAB — SURGICAL PATHOLOGY

## 2021-08-13 ENCOUNTER — Telehealth: Payer: Self-pay | Admitting: Family Medicine

## 2021-08-13 NOTE — Telephone Encounter (Signed)
Refer to phone call from 6/9

## 2021-08-13 NOTE — Telephone Encounter (Signed)
Lmtcb.

## 2021-08-18 NOTE — Telephone Encounter (Signed)
Attempted to contact- NA  No call back This encounter will be closed

## 2021-08-19 ENCOUNTER — Telehealth: Payer: Medicare HMO

## 2021-08-22 ENCOUNTER — Other Ambulatory Visit: Payer: Self-pay | Admitting: Family Medicine

## 2021-08-22 DIAGNOSIS — I152 Hypertension secondary to endocrine disorders: Secondary | ICD-10-CM

## 2021-08-24 DIAGNOSIS — C641 Malignant neoplasm of right kidney, except renal pelvis: Secondary | ICD-10-CM | POA: Diagnosis not present

## 2021-08-26 ENCOUNTER — Telehealth: Payer: Self-pay | Admitting: Family Medicine

## 2021-08-26 NOTE — Telephone Encounter (Signed)
Patient wants to talk to Almyra Free about Elenor [place] 2, No other information given. Please call back.

## 2021-09-02 ENCOUNTER — Telehealth: Payer: Self-pay | Admitting: Family Medicine

## 2021-09-03 NOTE — Telephone Encounter (Signed)
Please schedule patient for f/u She stated she had some questions For urgent needs --she can call PCP

## 2021-09-06 ENCOUNTER — Telehealth: Payer: Self-pay | Admitting: Family Medicine

## 2021-09-06 NOTE — Telephone Encounter (Signed)
Pt called requesting to speak with Brooke [name].  Says she is still having a lot of trouble with her insulin pens getting jammed, so she hasnt been getting the insulin that she needs.  Wants to know if a Rx can be sent in for her to get needles to draw out the insulin from the pens whenever they get jammed.  Please advise and call patient.

## 2021-09-14 ENCOUNTER — Telehealth: Payer: Self-pay | Admitting: Family Medicine

## 2021-09-14 NOTE — Telephone Encounter (Signed)
Left message informing pt that we do not have samples. We do have rebate cards. Advised pt to call back if she would like to have one.

## 2021-09-17 ENCOUNTER — Encounter: Payer: Self-pay | Admitting: Family Medicine

## 2021-09-17 ENCOUNTER — Telehealth: Payer: Self-pay

## 2021-09-17 ENCOUNTER — Ambulatory Visit (INDEPENDENT_AMBULATORY_CARE_PROVIDER_SITE_OTHER): Payer: Medicare HMO | Admitting: Family Medicine

## 2021-09-17 ENCOUNTER — Ambulatory Visit (INDEPENDENT_AMBULATORY_CARE_PROVIDER_SITE_OTHER): Payer: Medicare HMO

## 2021-09-17 VITALS — BP 140/78 | HR 66 | Temp 97.1°F | Ht 66.0 in | Wt 207.8 lb

## 2021-09-17 DIAGNOSIS — E1122 Type 2 diabetes mellitus with diabetic chronic kidney disease: Secondary | ICD-10-CM | POA: Diagnosis not present

## 2021-09-17 DIAGNOSIS — E1169 Type 2 diabetes mellitus with other specified complication: Secondary | ICD-10-CM | POA: Diagnosis not present

## 2021-09-17 DIAGNOSIS — M25762 Osteophyte, left knee: Secondary | ICD-10-CM | POA: Diagnosis not present

## 2021-09-17 DIAGNOSIS — M25562 Pain in left knee: Secondary | ICD-10-CM

## 2021-09-17 DIAGNOSIS — E1165 Type 2 diabetes mellitus with hyperglycemia: Secondary | ICD-10-CM

## 2021-09-17 DIAGNOSIS — Z794 Long term (current) use of insulin: Secondary | ICD-10-CM | POA: Diagnosis not present

## 2021-09-17 DIAGNOSIS — E1159 Type 2 diabetes mellitus with other circulatory complications: Secondary | ICD-10-CM | POA: Diagnosis not present

## 2021-09-17 DIAGNOSIS — M25761 Osteophyte, right knee: Secondary | ICD-10-CM | POA: Diagnosis not present

## 2021-09-17 DIAGNOSIS — E669 Obesity, unspecified: Secondary | ICD-10-CM

## 2021-09-17 DIAGNOSIS — Z905 Acquired absence of kidney: Secondary | ICD-10-CM

## 2021-09-17 DIAGNOSIS — E785 Hyperlipidemia, unspecified: Secondary | ICD-10-CM

## 2021-09-17 DIAGNOSIS — M25561 Pain in right knee: Secondary | ICD-10-CM | POA: Diagnosis not present

## 2021-09-17 DIAGNOSIS — N183 Chronic kidney disease, stage 3 unspecified: Secondary | ICD-10-CM

## 2021-09-17 DIAGNOSIS — G8929 Other chronic pain: Secondary | ICD-10-CM

## 2021-09-17 DIAGNOSIS — I152 Hypertension secondary to endocrine disorders: Secondary | ICD-10-CM | POA: Diagnosis not present

## 2021-09-17 LAB — BAYER DCA HB A1C WAIVED: HB A1C (BAYER DCA - WAIVED): 7.8 % — ABNORMAL HIGH (ref 4.8–5.6)

## 2021-09-17 NOTE — Progress Notes (Unsigned)
Assessment & Plan:  1. Type 2 diabetes mellitus with hyperglycemia, with long-term current use of insulin (HCC) A1c pending. - Home glucose monitoring: continue monitoring. - Patient is currently taking a statin. Patient is not taking an ACE-inhibitor/ARB after nephrectomy.  - Instruction/counseling given: reminded to bring blood glucose meter & log to each visit and discussed the need for weight loss  Diabetes Health Maintenance Due  Topic Date Due   HEMOGLOBIN A1C  03/20/2022   OPHTHALMOLOGY EXAM  04/13/2022   FOOT EXAM  05/13/2022   URINE MICROALBUMIN  05/13/2022    Lab Results  Component Value Date   LABMICR 24.2 05/12/2021   LABMICR See below: 03/05/2021   - CBC with Differential/Platelet - CMP14+EGFR - Lipid panel - Bayer DCA Hb A1c Waived  2. Hypertension associated with diabetes ([place]) Well controlled on current regimen.  - CBC with Differential/Platelet - CMP14+EGFR - Lipid panel  3. CKD stage 3 due to type 2 diabetes mellitus ([place]) Continue Jardiance and [place]. Avoid all NSAIDs. - CMP14+EGFR  4. Hyperlipidemia associated with type 2 diabetes mellitus ([place]) Well controlled on current regimen.  - CMP14+EGFR - Lipid panel  5. History of right nephrectomy - CMP14+EGFR  6. Obesity (BMI 30-39.9) Encouraged healthy eating and exercise. - CBC with Differential/Platelet - CMP14+EGFR - Lipid panel  7. Chronic pain of both knees Discussed completing x-rays first. If no contraindication, will bring patient back for a knee injection. Tylenol for pain. - DG Knee 1-2 Views Left - DG Knee 1-2 Views Right   Return as directed after labs result.  [name], MSN, APRN, FNP-C [facility]  Subjective:    Patient ID: Brooke [name], female    DOB: [birth year], 66 y.o.   MRN: [id number]  Patient Care Team: [name], FNP as PCP - General (Family Medicine) Lavera Guise, [facility] (Pharmacist) Celestia Khat, [place] (Optometry)    Chief Complaint:  Chief Complaint  Patient presents with   Diabetes    Follow up   Joint Swelling    Right knee swelling that has been ongoing and states it is no better.     HPI: Brooke [name] is a 66 y.o. female presenting on 09/17/2021 for Diabetes (Follow up) and Joint Swelling (Right knee swelling that has been ongoing and states it is no better. )  Diabetes: Patient presents for follow up of diabetes. Current symptoms include: hyperglycemia. Known diabetic complications: nephropathy and cardiovascular disease. Medication compliance: Yes, she is on Jardiance 25 mg, Trulicity 4.5 mg, Humalog 10-15 units ACHS, and Basaglar 35 units QHS, stopped metformin due to kidney function. Current diet: in general, an "unhealthy" diet, but has made attempts to reduce her sugar intake .  Current exercise: walking. Home blood sugar records:  90-150 fasting . Is she  on ACE inhibitor or angiotensin II receptor blocker? No, Lisinopril was discontinued after nephrectomy. Is she on a statin? Yes (Lovastatin). She is in need of quick lowering of her A1c in order to undergo a partial vs radial nephrectomy on 08/04/2021 due to kidney cancer.  New complaints:  Patient reports right knee pain that is ongoing and getting worse. She was able to do physical therapy three times before having her kidney removed, which she did feel was somewhat helpful. The pain gets worse with walking or turning. She has been sitting in the house crying due to not being able to do things she wants/needs because of the pain. She also feels she is gaining weight since she  is not able to exercise due to the pain. She has not had previous imaging.    Social history:  Relevant past medical, surgical, family and social history reviewed and updated as indicated. Interim medical history since our last visit reviewed.  Allergies and medications reviewed and updated.  DATA REVIEWED: CHART IN EPIC  ROS: Negative unless specifically  indicated above in HPI.    Current Outpatient Medications:    acetaminophen (TYLENOL) 500 MG tablet, Take 1,000 mg by mouth every 8 (eight) hours as needed for moderate pain., Disp: , Rfl:    albuterol (VENTOLIN HFA) 108 (90 Base) MCG/ACT inhaler, Inhale 2 puffs into the lungs every 6 (six) hours as needed for wheezing or shortness of breath., Disp: 18 g, Rfl: 2   amLODipine (NORVASC) 10 MG tablet, Take 1 tablet (10 mg total) by mouth daily., Disp: 90 tablet, Rfl: 1   atenolol (TENORMIN) 100 MG tablet, Take 1 tablet (100 mg total) by mouth every morning., Disp: 90 tablet, Rfl: 1   budesonide-formoterol (SYMBICORT) 160-4.5 MCG/ACT inhaler, Inhale 2 puffs into the lungs 2 (two) times daily., Disp: 3 each, Rfl: 5   Camphor-Menthol-Methyl Sal (SALONPAS) 3.03-12-08 % PTCH, Apply 1 patch topically daily as needed (pain)., Disp: , Rfl:    Cholecalciferol 25 MCG (1000 UT) capsule, Take 1,000 Units by mouth daily., Disp: , Rfl:    Continuous Blood Gluc Sensor (FREESTYLE LIBRE 2 SENSOR) MISC, 1 each by Does not apply route every 14 (fourteen) days., Disp: 2 each, Rfl: 5   docusate sodium (COLACE) 100 MG capsule, Take 1 capsule (100 mg total) by mouth 2 (two) times daily., Disp: , Rfl:    doxepin (SINEQUAN) 10 MG capsule, Take 1 capsule (10 mg total) by mouth at bedtime., Disp: 30 capsule, Rfl: 2   Dulaglutide (TRULICITY) 4.5 TX/7.7SF SOPN, Inject 4.5 mg as directed once a week. (Patient taking differently: Inject 4.5 mg as directed every Sunday.), Disp: 6 mL, Rfl: 1   empagliflozin (JARDIANCE) 25 MG TABS tablet, Take 1 tablet (25 mg total) by mouth daily before breakfast., Disp: 90 tablet, Rfl: 1   Finerenone (KERENDIA) 10 MG TABS, Take 10 mg by mouth daily., Disp: 90 tablet, Rfl: 1   hydrochlorothiazide (HYDRODIURIL) 25 MG tablet, Take 1 tablet by mouth once daily, Disp: 90 tablet, Rfl: 0   HYDROcodone-acetaminophen (NORCO) 5-325 MG tablet, Take 1-2 tablets by mouth every 6 (six) hours as needed for moderate  pain or severe pain., Disp: 20 tablet, Rfl: 0   Insulin Glargine (BASAGLAR KWIKPEN) 100 UNIT/ML, Inject 35 Units into the skin at bedtime., Disp: , Rfl:    insulin lispro (HUMALOG KWIKPEN) 100 UNIT/ML KwikPen, Inject 20-30 Units into the skin with breakfast, with lunch, and with evening meal. (Patient taking differently: Inject 10-15 Units into the skin See admin instructions. 10 units twice daily, 15 units at bedtime), Disp: 45 mL, Rfl: 11   Insulin Pen Needle (RELION PEN NEEDLES) 31G X 6 MM MISC, Use with insulin daily Dx E11.65, Disp: 100 each, Rfl: 3   lovastatin (MEVACOR) 40 MG tablet, Take 1.5 tablets (60 mg total) by mouth at bedtime., Disp: 135 tablet, Rfl: 1   Menthol, Topical Analgesic, (BIOFREEZE) 4 % GEL, Apply 1 application. topically daily as needed (pain)., Disp: , Rfl:    methocarbamol (ROBAXIN) 500 MG tablet, Take 1 tablet (500 mg total) by mouth every 8 (eight) hours as needed for muscle spasms., Disp: 60 tablet, Rfl: 1   No Known Allergies Past Medical History:  Diagnosis Date   Anemia    Arthritis    Bronchitis    frequent bronchitis   Diabetes mellitus    Diabetic nephropathy ([place])    Hepatic steatosis    Hypertension    Hyperuricemia 11/27/2020   Mass of right kidney    Obesity (BMI 35.0-39.9 without comorbidity)    Vertigo    Vitamin D insufficiency 11/27/2020    Past Surgical History:  Procedure Laterality Date   BREAST SURGERY Right 1974   OPERATIVE ULTRASOUND N/A 08/04/2021   Procedure: OPERATIVE ULTRASOUND;  Surgeon: Alexis Frock, MD;  Location: [facility];  Service: Urology;  Laterality: N/A;   ROBOTIC ASSITED PARTIAL NEPHRECTOMY Right 08/04/2021   Procedure: XI ROBOTIC ASSITED RADICAL NEPHRECTOMY;  Surgeon: Alexis Frock, MD;  Location: [facility];  Service: Urology;  Laterality: Right;    Social History   Socioeconomic History   Marital status: Married    Spouse name: Donnie   Number of children: 1   Years of education: Not on file   Highest education  level: Not on file  Occupational History   Not on file  Tobacco Use   Smoking status: Never   Smokeless tobacco: Never  Vaping Use   Vaping Use: Never used  Substance and Sexual Activity   Alcohol use: No   Drug use: No   Sexual activity: Never  Other Topics Concern   Not on file  Social History Narrative   1 daughter    1 grandson   Social Determinants of Health   Financial Resource Strain: Medium Risk (02/19/2021)   Overall Financial Resource Strain (CARDIA)    Difficulty of Paying Living Expenses: Somewhat hard  Food Insecurity: Food Insecurity Present (02/19/2021)   Hunger Vital Sign    Worried About Running Out of Food in the Last Year: Sometimes true    Ran Out of Food in the Last Year: Sometimes true  Transportation Needs: No Transportation Needs (02/01/2021)   PRAPARE - Hydrologist (Medical): No    Lack of Transportation (Non-Medical): No  Physical Activity: Inactive (02/01/2021)   Exercise Vital Sign    Days of Exercise per Week: 0 days    Minutes of Exercise per Session: 0 min  Stress: No Stress Concern Present (02/01/2021)   [place]    Feeling of Stress : Only a little  Social Connections: Socially Integrated (02/01/2021)   Social Connection and Isolation Panel [NHANES]    Frequency of Communication with Friends and Family: More than three times a week    Frequency of Social Gatherings with Friends and Family: More than three times a week    Attends Religious Services: More than 4 times per year    Active Member of [employer] or Organizations: Yes    Attends Music therapist: More than 4 times per year    Marital Status: Married  Human resources officer Violence: Not At Risk (02/01/2021)   Humiliation, Afraid, Rape, and Kick questionnaire    Fear of Current or Ex-Partner: No    Emotionally Abused: No    Physically Abused: No    Sexually Abused: No         Objective:    BP 140/78   Pulse 66   Temp (!) 97.1 F (36.2 C) (Temporal)   Ht '5\' 6"'  (1.676 m)   Wt 207 lb 12.8 oz (94.3 kg)   LMP 03/16/2012   SpO2 99%   BMI 33.54 kg/m  Wt Readings from Last 3 Encounters:  09/17/21 207 lb 12.8 oz (94.3 kg)  08/04/21 206 lb 2.1 oz (93.5 kg)  07/20/21 206 lb 3.2 oz (93.5 kg)    Physical Exam Vitals reviewed.  Constitutional:      General: She is not in acute distress.    Appearance: Normal appearance. She is obese. She is not ill-appearing, toxic-appearing or diaphoretic.  HENT:     Head: Normocephalic and atraumatic.  Eyes:     General: No scleral icterus.       Right eye: No discharge.        Left eye: No discharge.     Conjunctiva/sclera: Conjunctivae normal.  Cardiovascular:     Rate and Rhythm: Normal rate and regular rhythm.     Heart sounds: Normal heart sounds. No murmur heard.    No friction rub. No gallop.  Pulmonary:     Effort: Pulmonary effort is normal. No respiratory distress.     Breath sounds: Normal breath sounds. No stridor. No wheezing, rhonchi or rales.  Musculoskeletal:        General: Normal range of motion.     Cervical back: Normal range of motion.     Right knee: Swelling present. No deformity, effusion, erythema, ecchymosis or lacerations. Normal range of motion. Tenderness present. Normal pulse.     Instability Tests: Anterior drawer test negative. Posterior drawer test negative. Anterior Lachman test negative. Medial McMurray test negative and lateral McMurray test negative.  Skin:    General: Skin is warm and dry.     Capillary Refill: Capillary refill takes less than 2 seconds.  Neurological:     General: No focal deficit present.     Mental Status: She is alert and oriented to person, place, and time. Mental status is at baseline.  Psychiatric:        Mood and Affect: Mood normal.        Behavior: Behavior normal.        Thought Content: Thought content normal.        Judgment: Judgment normal.      Lab Results  Component Value Date   TSH 1.880 03/13/2019   Lab Results  Component Value Date   WBC 11.1 (H) 08/06/2021   HGB 11.2 (L) 08/06/2021   HCT 36.2 08/06/2021   MCV 87.4 08/06/2021   PLT 332 08/06/2021   Lab Results  Component Value Date   NA 136 08/06/2021   K 4.9 08/06/2021   CO2 22 08/06/2021   GLUCOSE 136 (H) 08/06/2021   BUN 41 (H) 08/06/2021   CREATININE 2.90 (H) 08/06/2021   BILITOT 0.7 07/20/2021   ALKPHOS 72 07/20/2021   AST 15 07/20/2021   ALT 17 07/20/2021   PROT 7.9 07/20/2021   ALBUMIN 3.9 07/20/2021   CALCIUM 8.6 (L) 08/06/2021   ANIONGAP 8 08/06/2021   EGFR 31 (L) 05/12/2021   Lab Results  Component Value Date   CHOL 187 05/12/2021   Lab Results  Component Value Date   HDL 43 05/12/2021   Lab Results  Component Value Date   LDLCALC 93 05/12/2021   Lab Results  Component Value Date   TRIG 304 (H) 05/12/2021   Lab Results  Component Value Date   CHOLHDL 4.3 05/12/2021   Lab Results  Component Value Date   HGBA1C 9.5 (H) 07/20/2021

## 2021-09-17 NOTE — Chronic Care Management (AMB) (Signed)
  Chronic Care Management Note  09/17/2021 Name: Brooke [name] MRN: [id number] DOB: 1955/05/31  Brooke [name] is a 66 y.o. year old female who is a primary care patient of [name], FNP and is actively engaged with the care management team. I reached out to Brooke [name] by phone today to assist with re-scheduling a follow up visit with the Pharmacist  Follow up plan: Unsuccessful telephone outreach attempt made. A HIPAA compliant phone message was left for the patient providing contact information and requesting a return call.  The care management team will reach out to the patient again over the next 5 days.  If patient returns call to provider office, please advise to call [facility]  at [place], [place], [place] [address] Direct Dial: [phone] [email or web address]

## 2021-09-17 NOTE — Chronic Care Management (AMB) (Signed)
  Chronic Care Management Note  09/17/2021 Name: Brooke [name] MRN: [id number] DOB: 12-Nov-1955  Brooke [name] is a 66 y.o. year old female who is a primary care patient of Brooke Brooklyn, FNP and is actively engaged with the care management team. I reached out to Brooke [name] by phone today to assist with re-scheduling a follow up visit with the Pharmacist  Follow up plan: Telephone appointment with care management team member scheduled for:10/08/2021  Noreene Larsson, [place], [place] [address] Direct Dial: [phone] [email or web address]

## 2021-09-18 LAB — CBC WITH DIFFERENTIAL/PLATELET
Basophils Absolute: 0.1 10*3/uL (ref 0.0–0.2)
Basos: 1 %
EOS (ABSOLUTE): 1.2 10*3/uL — ABNORMAL HIGH (ref 0.0–0.4)
Eos: 14 %
Hematocrit: 36.3 % (ref 34.0–46.6)
Hemoglobin: 11.5 g/dL (ref 11.1–15.9)
Immature Grans (Abs): 0 10*3/uL (ref 0.0–0.1)
Immature Granulocytes: 0 %
Lymphocytes Absolute: 2.7 10*3/uL (ref 0.7–3.1)
Lymphs: 32 %
MCH: 26 pg — ABNORMAL LOW (ref 26.6–33.0)
MCHC: 31.7 g/dL (ref 31.5–35.7)
MCV: 82 fL (ref 79–97)
Monocytes Absolute: 0.5 10*3/uL (ref 0.1–0.9)
Monocytes: 6 %
Neutrophils Absolute: 4 10*3/uL (ref 1.4–7.0)
Neutrophils: 47 %
Platelets: 414 10*3/uL (ref 150–450)
RBC: 4.42 x10E6/uL (ref 3.77–5.28)
RDW: 14.1 % (ref 11.7–15.4)
WBC: 8.5 10*3/uL (ref 3.4–10.8)

## 2021-09-18 LAB — CMP14+EGFR
ALT: 12 IU/L (ref 0–32)
AST: 14 IU/L (ref 0–40)
Albumin/Globulin Ratio: 1.4 (ref 1.2–2.2)
Albumin: 4.1 g/dL (ref 3.9–4.9)
Alkaline Phosphatase: 96 IU/L (ref 44–121)
BUN/Creatinine Ratio: 15 (ref 12–28)
BUN: 42 mg/dL — ABNORMAL HIGH (ref 8–27)
Bilirubin Total: 0.3 mg/dL (ref 0.0–1.2)
CO2: 18 mmol/L — ABNORMAL LOW (ref 20–29)
Calcium: 9 mg/dL (ref 8.7–10.3)
Chloride: 106 mmol/L (ref 96–106)
Creatinine, Ser: 2.86 mg/dL — ABNORMAL HIGH (ref 0.57–1.00)
Globulin, Total: 2.9 g/dL (ref 1.5–4.5)
Glucose: 118 mg/dL — ABNORMAL HIGH (ref 70–99)
Potassium: 5.1 mmol/L (ref 3.5–5.2)
Sodium: 137 mmol/L (ref 134–144)
Total Protein: 7 g/dL (ref 6.0–8.5)
eGFR: 17 mL/min/{1.73_m2} — ABNORMAL LOW (ref >59)

## 2021-09-18 LAB — LIPID PANEL
Chol/HDL Ratio: 5.3 ratio — ABNORMAL HIGH (ref 0.0–4.4)
Cholesterol, Total: 189 mg/dL (ref 100–199)
HDL: 36 mg/dL — ABNORMAL LOW (ref >39)
LDL Chol Calc (NIH): 111 mg/dL — ABNORMAL HIGH (ref 0–99)
Triglycerides: 241 mg/dL — ABNORMAL HIGH (ref 0–149)
VLDL Cholesterol Cal: 42 mg/dL — ABNORMAL HIGH (ref 5–40)

## 2021-09-20 ENCOUNTER — Ambulatory Visit (INDEPENDENT_AMBULATORY_CARE_PROVIDER_SITE_OTHER): Payer: Medicare HMO | Admitting: Nurse Practitioner

## 2021-09-20 ENCOUNTER — Encounter: Payer: Self-pay | Admitting: Nurse Practitioner

## 2021-09-20 VITALS — BP 125/65 | HR 73 | Ht 66.0 in | Wt 209.6 lb

## 2021-09-20 DIAGNOSIS — M25561 Pain in right knee: Secondary | ICD-10-CM

## 2021-09-20 DIAGNOSIS — G8929 Other chronic pain: Secondary | ICD-10-CM

## 2021-09-20 MED ORDER — METHYLPREDNISOLONE ACETATE 80 MG/ML IJ SUSP
80.0000 mg | Freq: Once | INTRAMUSCULAR | Status: AC
  Administered 2021-09-20: 80 mg via INTRAMUSCULAR

## 2021-09-20 NOTE — Progress Notes (Signed)
Acute Office Visit  Subjective:     Patient ID: Brooke [name], female    DOB: 08-24-1955, 66 y.o.   MRN: [id number]  Chief Complaint  Patient presents with   Knee Pain    Patient would like to get knee injection    Knee Pain  The incident occurred 5 to 7 days ago. The incident occurred at home. There was no injury mechanism. The pain is present in the right knee. The quality of the pain is described as aching. The pain is at a severity of 5/10. The pain is moderate. The pain has been Constant since onset. She reports no foreign bodies present. Nothing aggravates the symptoms. She has tried acetaminophen for the symptoms.   Joint Injection/Arthrocentesis  Date/Time: 09/20/2021 1:22 PM  Performed by: Ivy Lynn, NP Authorized by: Ivy Lynn, NP  Indications: pain  Body area: knee Joint: right knee Local anesthesia used: yes  Anesthesia: Local anesthesia used: yes Local Anesthetic: lidocaine 2% without epinephrine  Sedation: Patient sedated: no  Needle gauge: 25. Approach: lateral Methylprednisolone amount: 80 mg Lidocaine 2% amount: 2 mL Patient tolerance: patient tolerated the procedure well with no immediate complications       Review of Systems  Constitutional: Negative.   HENT: Negative.    Respiratory: Negative.    Cardiovascular: Negative.   Genitourinary: Negative.   Skin: Negative.  Negative for rash.  All other systems reviewed and are negative.       Objective:    BP 125/65   Pulse 73   Ht '5\' 6"'$  (1.676 m)   Wt 209 lb 9.6 oz (95.1 kg)   LMP 03/16/2012   SpO2 96%   BMI 33.83 kg/m  BP Readings from Last 3 Encounters:  09/20/21 125/65  09/17/21 140/78  08/06/21 117/62   Wt Readings from Last 3 Encounters:  09/20/21 209 lb 9.6 oz (95.1 kg)  09/17/21 207 lb 12.8 oz (94.3 kg)  08/04/21 206 lb 2.1 oz (93.5 kg)      Physical Exam Vitals and nursing note reviewed.  Constitutional:      Appearance: Normal appearance.   HENT:     Head: Normocephalic.     Right Ear: External ear normal.     Left Ear: External ear normal.     Nose: Nose normal.     Mouth/Throat:     Mouth: Mucous membranes are moist.     Pharynx: Oropharynx is clear.  Eyes:     Conjunctiva/sclera: Conjunctivae normal.  Cardiovascular:     Rate and Rhythm: Normal rate and regular rhythm.     Pulses: Normal pulses.     Heart sounds: Normal heart sounds.  Pulmonary:     Effort: Pulmonary effort is normal.     Breath sounds: Normal breath sounds.  Abdominal:     General: Bowel sounds are normal.  Musculoskeletal:     Right knee: Normal range of motion. Tenderness present. No MCL, LCL, ACL or PCL tenderness.     Comments: Right knee pain   Skin:    General: Skin is warm.  Neurological:     General: No focal deficit present.     Mental Status: She is alert and oriented to person, place, and time.  Psychiatric:        Mood and Affect: Mood normal.        Behavior: Behavior normal.     No results found for any visits on 09/20/21.      Assessment & Plan:  Right knee pain not well controlled.  Completed knee injection.  Patient tolerated procedure well.  Education provided to patient to watch for signs and symptoms of infection, increase in blood sugar/blood glucose.  Patient verbalized understanding.  Patient knows to follow-up with worsening unresolved symptoms. Problem List Items Addressed This Visit   None Visit Diagnoses     Chronic pain of right knee    -  Primary   Relevant Medications   methylPREDNISolone acetate (DEPO-MEDROL) injection 80 mg (Completed)       Meds ordered this encounter  Medications   methylPREDNISolone acetate (DEPO-MEDROL) injection 80 mg    Return if symptoms worsen or fail to improve.  Ivy Lynn, NP

## 2021-09-20 NOTE — Patient Instructions (Signed)
Acute Knee Pain, Adult Many things can cause knee pain. Sometimes, knee pain is sudden (acute) and may be caused by damage, swelling, or irritation of the muscles and tissues that support your knee. The pain often goes away on its own with time and rest. If the pain does not go away, tests may be done to find out what is causing the pain. Follow these instructions at home: If you have a knee sleeve or brace:  Wear the knee sleeve or brace as told by your doctor. Take it off only as told by your doctor. Loosen it if your toes: Tingle. Become numb. Turn cold and blue. Keep it clean. If the knee sleeve or brace is not waterproof: Do not let it get wet. Cover it with a watertight covering when you take a bath or shower. Activity Rest your knee. Do not do things that cause pain or make pain worse. Avoid activities where both feet leave the ground at the same time (high-impact activities). Examples are running, jumping rope, and doing jumping jacks. Work with a physical therapist to make a safe exercise program, as told by your doctor. Managing pain, stiffness, and swelling  If told, put ice on the knee. To do this: If you have a removable knee sleeve or brace, take it off as told by your doctor. Put ice in a plastic bag. Place a towel between your skin and the bag. Leave the ice on for 20 minutes, 2-3 times a day. Take off the ice if your skin turns bright red. This is very important. If you cannot feel pain, heat, or cold, you have a greater risk of damage to the area. If told, use an elastic bandage to put pressure (compression) on your injured knee. Raise your knee above the level of your heart while you are sitting or lying down. Sleep with a pillow under your knee. General instructions Take over-the-counter and prescription medicines only as told by your doctor. Do not smoke or use any products that contain nicotine or tobacco. If you need help quitting, ask your doctor. If you are  overweight, work with your doctor and a food expert (dietitian) to set goals to lose weight. Being overweight can make your knee hurt more. Watch for any changes in your symptoms. Keep all follow-up visits. Contact a doctor if: The knee pain does not stop. The knee pain changes or gets worse. You have a fever along with knee pain. Your knee is red or feels warm when you touch it. Your knee gives out or locks up. Get help right away if: Your knee swells, and the swelling gets worse. You cannot move your knee. You have very bad knee pain that does not get better with pain medicine. Summary Many things can cause knee pain. The pain often goes away on its own with time and rest. Your doctor may do tests to find out the cause of the pain. Watch for any changes in your symptoms. Relieve your pain with rest, medicines, light activity, and use of ice. Get help right away if you cannot move your knee or your knee pain is very bad. This information is not intended to replace advice given to you by your health care provider. Make sure you discuss any questions you have with your health care provider. Document Revised: 08/07/2019 Document Reviewed: 08/07/2019 Elsevier Patient Education  2023 Elsevier Inc.  

## 2021-09-21 DIAGNOSIS — R809 Proteinuria, unspecified: Secondary | ICD-10-CM | POA: Diagnosis not present

## 2021-09-21 DIAGNOSIS — E1129 Type 2 diabetes mellitus with other diabetic kidney complication: Secondary | ICD-10-CM | POA: Diagnosis not present

## 2021-09-21 DIAGNOSIS — E1122 Type 2 diabetes mellitus with diabetic chronic kidney disease: Secondary | ICD-10-CM | POA: Diagnosis not present

## 2021-09-21 DIAGNOSIS — N2889 Other specified disorders of kidney and ureter: Secondary | ICD-10-CM | POA: Diagnosis not present

## 2021-09-21 DIAGNOSIS — N189 Chronic kidney disease, unspecified: Secondary | ICD-10-CM | POA: Diagnosis not present

## 2021-09-21 DIAGNOSIS — I129 Hypertensive chronic kidney disease with stage 1 through stage 4 chronic kidney disease, or unspecified chronic kidney disease: Secondary | ICD-10-CM | POA: Diagnosis not present

## 2021-09-21 DIAGNOSIS — N19 Unspecified kidney failure: Secondary | ICD-10-CM | POA: Diagnosis not present

## 2021-09-22 ENCOUNTER — Encounter: Payer: Self-pay | Admitting: Family Medicine

## 2021-09-22 ENCOUNTER — Other Ambulatory Visit: Payer: Self-pay | Admitting: Family Medicine

## 2021-09-22 DIAGNOSIS — Z905 Acquired absence of kidney: Secondary | ICD-10-CM | POA: Insufficient documentation

## 2021-09-22 DIAGNOSIS — E1169 Type 2 diabetes mellitus with other specified complication: Secondary | ICD-10-CM

## 2021-09-22 HISTORY — DX: Acquired absence of kidney: Z90.5

## 2021-09-23 ENCOUNTER — Telehealth: Payer: Self-pay | Admitting: Pharmacist

## 2021-09-23 NOTE — Telephone Encounter (Signed)
Please let patient know: Samples of kerendia and her financial forms are ready for pick up at front Placed in the same bag

## 2021-09-23 NOTE — Telephone Encounter (Signed)
LM to [facility]  

## 2021-09-28 ENCOUNTER — Other Ambulatory Visit: Payer: Self-pay | Admitting: Family Medicine

## 2021-09-28 ENCOUNTER — Telehealth: Payer: Self-pay | Admitting: Pharmacist

## 2021-09-28 DIAGNOSIS — I152 Hypertension secondary to endocrine disorders: Secondary | ICD-10-CM

## 2021-09-28 NOTE — Telephone Encounter (Signed)
PATIENT ASSISTANCE APPLICATION SUBMITTED FOR KERENDIA '10MG'$ --WILL LOOK INTO INCREASING TO '20MG'$  DAILY ([facility] PATIENT ASSISTANCE PROGRAM) NEPHRO ON BOARD AS WELL  1- MONTH OF SAMPLES GIVEN TO PATIENT TOLERATING OKAY LABS REVIEWED

## 2021-10-02 DIAGNOSIS — D631 Anemia in chronic kidney disease: Secondary | ICD-10-CM | POA: Diagnosis not present

## 2021-10-02 DIAGNOSIS — E875 Hyperkalemia: Secondary | ICD-10-CM | POA: Diagnosis not present

## 2021-10-02 DIAGNOSIS — E1122 Type 2 diabetes mellitus with diabetic chronic kidney disease: Secondary | ICD-10-CM | POA: Diagnosis not present

## 2021-10-02 DIAGNOSIS — I129 Hypertensive chronic kidney disease with stage 1 through stage 4 chronic kidney disease, or unspecified chronic kidney disease: Secondary | ICD-10-CM | POA: Diagnosis not present

## 2021-10-02 DIAGNOSIS — N189 Chronic kidney disease, unspecified: Secondary | ICD-10-CM | POA: Diagnosis not present

## 2021-10-02 DIAGNOSIS — Z905 Acquired absence of kidney: Secondary | ICD-10-CM | POA: Diagnosis not present

## 2021-10-02 DIAGNOSIS — E1129 Type 2 diabetes mellitus with other diabetic kidney complication: Secondary | ICD-10-CM | POA: Diagnosis not present

## 2021-10-02 DIAGNOSIS — R809 Proteinuria, unspecified: Secondary | ICD-10-CM | POA: Diagnosis not present

## 2021-10-05 ENCOUNTER — Telehealth: Payer: Medicare HMO

## 2021-10-08 ENCOUNTER — Ambulatory Visit (INDEPENDENT_AMBULATORY_CARE_PROVIDER_SITE_OTHER): Payer: Medicare HMO | Admitting: Pharmacist

## 2021-10-08 DIAGNOSIS — E1165 Type 2 diabetes mellitus with hyperglycemia: Secondary | ICD-10-CM

## 2021-10-08 DIAGNOSIS — N183 Chronic kidney disease, stage 3 unspecified: Secondary | ICD-10-CM

## 2021-10-14 NOTE — Patient Instructions (Addendum)
Visit Information  Following are the goals we discussed today:  Current Barriers:  Unable to independently afford treatment regimen Unable to achieve control of T2DM, CKD   Pharmacist Clinical Goal(s):  Over the next 90 days, patient will verbalize ability to afford treatment regimen maintain control of T2DM as evidenced by GOAL A1C<7%  through collaboration with PharmD and provider.    Interventions: 1:1 collaboration with [name], FNP regarding development and update of comprehensive plan of care as evidenced by provider attestation and co-signature Inter-disciplinary care team collaboration (see longitudinal plan of care) Comprehensive medication review performed; medication list updated in electronic medical record  Diabetes: Uncontrolled A1c 10.9%--> 7.8%; current treatment: BASAGLAR 16-01 UNITS, TRULICITY 4.'5MG'$ , JARDIANCE '25mg'$  daily; Humalog added for meal times (5-10 units with meals) PAST meds: Metformin was discontinued due to GFR 28-->new start Kerendia (pt sees nephro next week, repeat labs were within normal limits)--trying to get, too expensive Continues to work on controlling her diabetes, but reports multiple health stressors Denies personal and family history of Medullary thyroid cancer (MTC) Current glucose readings: fasting glucose: <165, post prandial glucose: <220 Patient can't afford Libre CGM--$75 based on 20% copay [employer] DME Denies hypoglycemic/hyperglycemic symptoms Discussed meal planning options and Plate method for healthy eating Avoid sugary drinks and desserts Incorporate balanced protein, non starchy veggies, 1 serving of carbohydrate with each meal Increase water intake Increase physical activity as able Current exercise: N/A Educated on [place], BG READINGS; [place] patient finances. Enrolled in  the [place] cares patient assistance for basaglar/trulicity/humalog; enrollment sent for BI cares (jardiance); and [place]  (symbicort) patient assistance; kerendia--approved for bayer patient assistance  Patient Goals/Self-Care Activities Over the next 90 days, patient will:  - take medications as prescribed check glucose DAILY, document, and provide at future appointments  Follow Up Plan: Telephone follow up appointment with care management team member scheduled for: 3 months   Plan: Telephone follow up appointment with care management team member scheduled for:  3 months  Signature Regina Eck, PharmD, BCPS Clinical Pharmacist, [place]  II Phone [phone]   Please call the care guide team at [phone] if you need to cancel or reschedule your appointment.   The patient verbalized understanding of instructions, educational materials, and care plan provided today and DECLINED offer to receive copy of patient instructions, educational materials, and care plan.

## 2021-10-14 NOTE — Telephone Encounter (Signed)
Encounter is 68 weeks old, encounter closed

## 2021-10-14 NOTE — Progress Notes (Signed)
Chronic Care Management Pharmacy Note  10/08/2021 Name:  Brooke [name] MRN:  [id number] DOB:  10-16-1955  Summary: Diabetes: Uncontrolled A1c 10.9%--> 7.8%; current treatment: BASAGLAR 73-42 UNITS, TRULICITY 8.7GO, JARDIANCE 69m daily; Humalog added for meal times (5-10 units with meals) PAST meds: Metformin was discontinued due to GFR 28-->new start KCarrington Clamp Continues to work on controlling her diabetes, but reports multiple health stressors Denies personal and family history of Medullary thyroid cancer (MTC) Current glucose readings: fasting glucose: <165, post prandial glucose: <220 Patient can't afford Libre CGM--$75 based on 20% copay human[employer] DME Denies hypoglycemic/hyperglycemic symptoms Discussed meal planning options and Plate method for healthy eating Avoid sugary drinks and desserts Incorporate balanced protein, non starchy veggies, 1 serving of carbohydrate with each meal Increase water intake Increase physical activity as able Current exercise: N/A [place]. [place] in  the [place] cares patient assistance for basaglar/trulicity/humalog; enrollment sent for BI cares (jardiance); and [place]&me (symbicort) patient assistance; kerendia--approved for bayer patient assistance  Patient Goals/Self-Care Activities Over the next 90 days, patient will:  - take medications as prescribed check glucose DAILY, document, and provide at future appointments  Follow Up Plan: Telephone follow up appointment with care management team member scheduled for: 3 months  Subjective: Brooke Rogoffis an 66y.o. year old female who is a primary patient of Brooke [name].  The CCM team was consulted for assistance with disease management and care coordination needs.    Engaged with patient by telephone for follow up visit in response to provider referral for pharmacy case management and/or care coordination services.    Consent to Services:  The patient was given information about Chronic Care Management services, agreed to services, and gave verbal consent prior to initiation of services.  Please see initial visit note for detailed documentation.   Patient Care Team: Brooke [name] as PCP - General (Family Medicine) [name]) JCelestia [name] OD (Optometry)  Objective:  Lab Results  Component Value Date   CREATININE 2.86 (H) 09/17/2021   CREATININE 2.90 (H) 08/06/2021   CREATININE 2.76 (H) 08/05/2021    Lab Results  Component Value Date   HGBA1C 7.8 (H) 09/17/2021   Last diabetic Eye exam:  Lab Results  Component Value Date/Time   HMDIABEYEEXA No Retinopathy 04/13/2021 12:00 AM    Last diabetic Foot exam: No results found for: "HMDIABFOOTEX"      Component Value Date/Time   CHOL 189 09/17/2021 0942   TRIG 241 (H) 09/17/2021 0942   HDL 36 (L) 09/17/2021 0942   CHOLHDL 5.3 (H) 09/17/2021 0942   LDLCALC 111 (H) 09/17/2021 0942       Latest Ref Rng & Units 09/17/2021    9:42 AM 07/20/2021    1:24 PM 05/12/2021    3:27 PM  Hepatic Function  Total Protein 6.0 - 8.5 g/dL 7.0  7.9  7.1   Albumin 3.9 - 4.9 g/dL 4.1  3.9  4.0   AST 0 - 40 IU/L _0 ALT 0 - 32 IU/L _1 Alk Phosphatase 44 - 121 IU/L 96  72  93   Total Bilirubin 0.0 - 1.2 mg/dL 0.3  0.7  0.3     Lab Results  Component Value Date/Time   TSH 1.880 03/13/2019 09:55 AM       Latest Ref Rng & Units 09/17/2021    9:42  AM 08/06/2021    4:25 AM 08/05/2021    5:09 AM  CBC  WBC 3.4 - 10.8 x10E3/uL 8.5  11.1    Hemoglobin 11.1 - 15.9 g/dL 11.5  11.2  11.2   Hematocrit 34.0 - 46.6 % 36.3  36.2  36.4   Platelets 150 - 450 x10E3/uL 414  332      No results found for: "VD25OH"  Clinical ASCVD: No  The 10-year ASCVD risk score (Arnett DK, et al., 2019) is: 26.1%   Values used to calculate the score:     Age: 66 years     Sex: Female     Is Non-Hispanic African American: Yes      Diabetic: Yes     Tobacco smoker: No     Systolic Blood Pressure: 364 mmHg     Is BP treated: Yes     HDL Cholesterol: 36 mg/dL     Total Cholesterol: 189 mg/dL    Other: (CHADS2VASc if Afib, PHQ9 if depression, MMRC or CAT for COPD, ACT, DEXA)  Social History   Tobacco Use  Smoking Status Never  Smokeless Tobacco Never   BP Readings from Last 3 Encounters:  09/20/21 125/65  09/17/21 140/78  08/06/21 117/62   Pulse Readings from Last 3 Encounters:  09/20/21 73  09/17/21 66  08/06/21 74   Wt Readings from Last 3 Encounters:  09/20/21 209 lb 9.6 oz (95.1 kg)  09/17/21 207 lb 12.8 oz (94.3 kg)  08/04/21 206 lb 2.1 oz (93.5 kg)    Assessment: Review of patient past medical history, allergies, medications, health status, including review of consultants reports, laboratory and other test data, was performed as part of comprehensive evaluation and provision of chronic care management services.   SDOH:  (Social Determinants of Health) assessments and interventions performed:    CCM Care Plan  No Known Allergies  Medications Reviewed Today     Reviewed by Lavera [name], [facility] (Pharmacist) on 10/14/21 at [place] List Status: <None>   Medication Order Taking? Sig Documenting Provider Last Dose Status Informant  acetaminophen (TYLENOL) 500 MG tablet [id number] No Take 1,000 mg by mouth every 8 (eight) hours as needed for moderate pain. [provider] Taking Active Self  albuterol (VENTOLIN HFA) 108 (90 Base) MCG/ACT inhaler [id number] No Inhale 2 puffs into the lungs every 6 (six) hours as needed for wheezing or shortness of breath. [name], [name] Taking Active Self  amLODipine (NORVASC) 10 MG tablet [id number] No Take 1 tablet (10 mg total) by mouth daily. [name], [name] Taking Active Self  atenolol (TENORMIN) 100 MG tablet [id number] No Take 1 tablet (100 mg total) by mouth every morning. [name], [name] Taking Active Self  budesonide-formoterol  [facility]) 160-4.5 MCG/ACT inhaler [id number] No Inhale 2 puffs into the lungs 2 (two) times daily. [name], [name] Taking Active Self           Med Note Blanca Friend, Royce Macadamia   Tue Sep 28, 2021  1:26 PM) Via [place] patient assistance program   Camphor-Menthol-Methyl Sal (SALONPAS) 3.03-12-08 % [facility] [id number] No Apply 1 patch topically daily as needed (pain). [provider] Taking Active Self  Cholecalciferol 25 MCG (1000 UT) capsule [id number] No Take 1,000 Units by mouth daily. [provider] Taking Active Self  docusate sodium (COLACE) 100 MG capsule [id number] No Take 1 capsule (100 mg total) by mouth 2 (two) times daily. Debbrah Alar, PA-C Taking Active   doxepin [facility])  10 MG capsule [id number] No Take 1 capsule (10 mg total) by mouth at bedtime. [name], [name] Taking Active Self  Dulaglutide (TRULICITY) 4.5 UV/2.5DG SOPN [id number] No Inject 4.5 mg as directed once a week.  Patient taking differently: Inject 4.5 mg as directed every Sunday.   [name], [name] Taking Active Self           Med Note Blanca Friend, Laterria Lasota D   Wed Jun 09, 2021 10:30 AM) Via [place] Cares patient assistance program    empagliflozin (JARDIANCE) 25 MG TABS tablet [id number] No Take 1 tablet (25 mg total) by mouth daily before breakfast. [name], [name] Taking Active Self  Finerenone (KERENDIA) 10 MG TABS [id number] No Take 10 mg by mouth daily. [name], [name] Taking Active Self           Med Note Blanca Friend, Royce Macadamia   Tue Sep 28, 2021  1:27 PM) VIA [place] PATIENT ASSISTANCE PROGRAM  hydrochlorothiazide (HYDRODIURIL) 25 MG tablet [id number] No Take 1 tablet by mouth once daily [name], [name] Taking Active   HYDROcodone-acetaminophen (NORCO) 5-325 MG tablet [id number] No Take 1-2 tablets by mouth every 6 (six) hours as needed for moderate pain or severe pain. Debbrah Alar, PA-C Taking Active   Insulin Glargine (BASAGLAR KWIKPEN) 100 UNIT/ML [id number] No Inject 35 Units into the skin  at bedtime. [provider] Taking Active Self           Med Note Parthenia Ames Dec 31, 2020 12:05 PM) Via lilly cares patient assistance program  insulin lispro (HUMALOG KWIKPEN) 100 UNIT/ML KwikPen [id number] No Inject 20-30 Units into the skin with breakfast, with lunch, and with evening meal.  Patient taking differently: Inject 10-15 Units into the skin See admin instructions. 10 units twice daily, 15 units at bedtime   [name] F, [name] Taking Active Self           Med Note ([place] Aug 04, 2021  6:30 AM) Took 5 units this morning at 0415  Insulin Pen Needle (RELION PEN NEEDLES) 31G X 6 MM MISC [id number] No Use with insulin daily Dx E11.65 [name], [name] Taking Active   lisinopril (ZESTRIL) 10 MG tablet [id number]  Take 1 tablet by mouth once daily [name], [name]  Active   lovastatin (MEVACOR) 40 MG tablet [id number] No Take 1.5 tablets (60 mg total) by mouth at bedtime. [name], [name] Taking Active Self  Menthol, Topical Analgesic, (BIOFREEZE) 4 % GEL [id number] No Apply 1 application. topically daily as needed (pain). [provider] Taking Active Self  methocarbamol (ROBAXIN) 500 MG tablet [id number] No Take 1 tablet (500 mg total) by mouth every 8 (eight) hours as needed for muscle spasms. [name], [name] Taking Active Self  Med List Note Lavera [name] [facility] 78/93/81 0175): trulicity, basaglar, humalog escribe to [facility] specialty mail order symbicort-escribe to [facility] mail order jardiance-escribe to pharmacord mail order             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   Thickened endometrium 03/24/2021   Vaginal bleeding 03/24/2021   Difficulty sleeping 02/10/2021   Positive colorectal cancer screening using Cologuard test 12/10/2020   Vitamin D insufficiency 11/27/2020   Diabetic nephropathy ([place])    CKD stage 3 due to type 2 diabetes mellitus  ([place]) 03/14/2019   Type 2 diabetes mellitus  with hyperglycemia, with long-term current use of insulin ([place]) 03/13/2019   Hyperlipidemia associated with type 2 diabetes mellitus ([place]) 03/13/2019   Hypertension associated with diabetes ([place]) 03/13/2019   Obesity (BMI 30-39.9) 03/13/2019   Chronic bronchitis ([place]) 03/13/2019    Immunization History  Administered Date(s) Administered   Fluad Quad(high Dose 65+) 03/13/2019, 11/28/2019, 12/10/2020   PFIZER(Purple Top)SARS-COV-2 Vaccination 04/12/2019, 05/03/2019, 01/14/2020    Conditions to be addressed/monitored: DMII and CKD Stage 3b  Care Plan : PHARMD MEDICATION MANAGEMENT  Updates made by Lavera [name], RPH since 10/14/2021 12:00 AM     Problem: DISEASE PROGRESSION PREVENTION      Long-Range Goal: T2DM, CKD PharmD goal   Recent Progress: Not on track  Priority: High  Note:   Current Barriers:  Unable to independently afford treatment regimen Unable to achieve control of T2DM, CKD   Pharmacist Clinical Goal(s):  Over the next 90 days, patient will verbalize ability to afford treatment regimen maintain control of T2DM as evidenced by GOAL A1C<7%  through collaboration with PharmD and provider.    Interventions: 1:1 collaboration with [name], [name] regarding development and update of comprehensive plan of care as evidenced by provider attestation and co-signature Inter-disciplinary care team collaboration (see longitudinal plan of care) Comprehensive medication review performed; medication list updated in electronic medical record  Diabetes: Uncontrolled A1c 10.9%--> 7.8%; current treatment: BASAGLAR 62-03 UNITS, TRULICITY 5.5HR, JARDIANCE 55m daily; Humalog added for meal times (5-10 units with meals) PAST meds: Metformin was discontinued due to GFR 28-->new start Kerendia (pt sees nephro next week, repeat labs were within normal limits)--trying to get, too expensive Continues to work on controlling her diabetes,  but reports multiple health stressors Denies personal and family history of Medullary thyroid cancer (MTC) Current glucose readings: fasting glucose: <165, post prandial glucose: <220 Patient can't afford Libre CGM--$75 based on 20% copay humana DME Denies hypoglycemic/hyperglycemic symptoms Discussed meal planning options and Plate method for healthy eating Avoid sugary drinks and desserts Incorporate balanced protein, non starchy veggies, 1 serving of carbohydrate with each meal Increase water intake Increase physical activity as able Current exercise: N/A [place]. En[place] in  the [place]illy cares patient assistance for basaglar/trulicity/humalog; enrollment sent for BI cares (jardiance); and AZ&me (symbicort) patient assistance; kerendia--approved for bayer patient assistance  Patient Goals/Self-Care Activities Over the next 90 days, patient will:  - take medications as prescribed check glucose DAILY, document, and provide at future appointments  Follow Up Plan: Telephone follow up appointment with care management team member scheduled for: 3 months     Plan: Telephone follow up appointment with care management team member scheduled for:  3 months   JRegina Eck PharmD, BCPS Clinical Pharmacist, [place] Phone [phone]

## 2021-10-16 ENCOUNTER — Emergency Department (HOSPITAL_COMMUNITY)
Admission: EM | Admit: 2021-10-16 | Discharge: 2021-10-16 | Disposition: A | Discharge status: Home / Self Care | Payer: Medicare HMO | Attending: Emergency Medicine | Admitting: Emergency Medicine

## 2021-10-16 ENCOUNTER — Emergency Department (HOSPITAL_COMMUNITY): Payer: Medicare HMO

## 2021-10-16 ENCOUNTER — Other Ambulatory Visit: Payer: Self-pay

## 2021-10-16 ENCOUNTER — Encounter (HOSPITAL_COMMUNITY): Payer: Self-pay | Admitting: *Deleted

## 2021-10-16 DIAGNOSIS — R509 Fever, unspecified: Secondary | ICD-10-CM | POA: Diagnosis not present

## 2021-10-16 DIAGNOSIS — E1122 Type 2 diabetes mellitus with diabetic chronic kidney disease: Secondary | ICD-10-CM | POA: Diagnosis not present

## 2021-10-16 DIAGNOSIS — N189 Chronic kidney disease, unspecified: Secondary | ICD-10-CM | POA: Diagnosis not present

## 2021-10-16 DIAGNOSIS — Z79899 Other long term (current) drug therapy: Secondary | ICD-10-CM | POA: Insufficient documentation

## 2021-10-16 DIAGNOSIS — R5383 Other fatigue: Secondary | ICD-10-CM | POA: Diagnosis present

## 2021-10-16 DIAGNOSIS — Z85528 Personal history of other malignant neoplasm of kidney: Secondary | ICD-10-CM | POA: Insufficient documentation

## 2021-10-16 DIAGNOSIS — Z794 Long term (current) use of insulin: Secondary | ICD-10-CM | POA: Insufficient documentation

## 2021-10-16 DIAGNOSIS — I129 Hypertensive chronic kidney disease with stage 1 through stage 4 chronic kidney disease, or unspecified chronic kidney disease: Secondary | ICD-10-CM | POA: Diagnosis not present

## 2021-10-16 DIAGNOSIS — U071 COVID-19: Secondary | ICD-10-CM | POA: Insufficient documentation

## 2021-10-16 DIAGNOSIS — R059 Cough, unspecified: Secondary | ICD-10-CM | POA: Diagnosis not present

## 2021-10-16 LAB — RESP PANEL BY RT-PCR (FLU A&B, COVID) ARPGX2
Influenza A by PCR: NEGATIVE
Influenza B by PCR: NEGATIVE
SARS Coronavirus 2 by RT PCR: POSITIVE — AB

## 2021-10-16 MED ORDER — MOLNUPIRAVIR EUA 200MG CAPSULE: 4.0000 | ORAL_CAPSULE | Freq: Two times a day (BID) | ORAL | 0 refills | Status: AC

## 2021-10-16 MED ORDER — BENZONATATE 200 MG PO CAPS: 200.0000 mg | ORAL_CAPSULE | Freq: Three times a day (TID) | ORAL | 0 refills | Status: DC | PRN

## 2021-10-16 NOTE — ED Triage Notes (Signed)
Pt c/o generalized weakness and fatigue since yesterday. + coughing-productive, white yellow in color per pt.    100.3 noted in triage.

## 2021-10-16 NOTE — ED Provider Notes (Signed)
[facility] EMERGENCY DEPARTMENT Provider Note   CSN: [id number] Arrival date & time: 10/16/21  1604     History {Add pertinent medical, surgical, social history, OB history to HPI:1} Chief Complaint  Patient presents with   Fatigue    Brooke [name] is a 66 y.o. female.  HPI      Brooke [name] is a 66 y.o. female with past medical history of type 2 diabetes, hypertension, CKD and prior history of renal cell carcinoma with right nephrectomy who presents to the Emergency Department complaining of generalized weakness and fatigue with cough and sneezing.  Cough has been persistent x1 week.  Sneezing and generalized weakness since yesterday.  She is concerned that she may be dehydrated.  States at times cough is productive of colored sputum.  Sneezing has been intermittent.  She denies feeling any nasal congestion.  Does feel that her chest is congested.  She denies any shortness of breath.  She endorses some loose stools today without vomiting or nausea.  No reported fever.  She denies any COVID exposures, but states she is fully vaccinated.  She is here with her sister who has similar symptoms.  Sister states that they are pastor recently tested positive for COVID.  Home Medications Prior to Admission medications   Medication Sig Start Date End Date Taking? Authorizing Provider  acetaminophen (TYLENOL) 500 MG tablet Take 1,000 mg by mouth every 8 (eight) hours as needed for moderate pain.    [provider]  albuterol (VENTOLIN HFA) 108 (90 Base) MCG/ACT inhaler Inhale 2 puffs into the lungs every 6 (six) hours as needed for wheezing or shortness of breath. 06/26/19   [name], FNP  amLODipine (NORVASC) 10 MG tablet Take 1 tablet (10 mg total) by mouth daily. 05/12/21   [name], FNP  atenolol (TENORMIN) 100 MG tablet Take 1 tablet (100 mg total) by mouth every morning. 05/12/21   [name], FNP  budesonide-formoterol [facility]) 160-4.5 MCG/ACT inhaler  Inhale 2 puffs into the lungs 2 (two) times daily. 07/09/21   [name], FNP  Camphor-Menthol-Methyl Sal (SALONPAS) 3.03-12-08 % [facility] Apply 1 patch topically daily as needed (pain).    [provider]  Cholecalciferol 25 MCG (1000 UT) capsule Take 1,000 Units by mouth daily. 01/07/21   [provider]  docusate sodium (COLACE) 100 MG capsule Take 1 capsule (100 mg total) by mouth 2 (two) times daily. 08/04/21   Debbrah Alar, PA-C  doxepin (SINEQUAN) 10 MG capsule Take 1 capsule (10 mg total) by mouth at bedtime. 05/12/21   [name], FNP  Dulaglutide (TRULICITY) 4.5 YP/9.5KD SOPN Inject 4.5 mg as directed once a week. Patient taking differently: Inject 4.5 mg as directed every Sunday. 05/12/21   [name], FNP  empagliflozin (JARDIANCE) 25 MG TABS tablet Take 1 tablet (25 mg total) by mouth daily before breakfast. 05/12/21   [name], FNP  Finerenone (KERENDIA) 10 MG TABS Take 10 mg by mouth daily. 02/11/21   [name], FNP  hydrochlorothiazide (HYDRODIURIL) 25 MG tablet Take 1 tablet by mouth once daily 08/23/21   [name], FNP  HYDROcodone-acetaminophen (NORCO) 5-325 MG tablet Take 1-2 tablets by mouth every 6 (six) hours as needed for moderate pain or severe pain. 08/04/21   Debbrah Alar, PA-C  Insulin Glargine (BASAGLAR KWIKPEN) 100 UNIT/ML Inject 35 Units into the skin at bedtime.    [provider]  insulin lispro (HUMALOG KWIKPEN) 100 UNIT/ML KwikPen Inject  20-30 Units into the skin with breakfast, with lunch, and with evening meal. Patient taking differently: Inject 10-15 Units into the skin See admin instructions. 10 units twice daily, 15 units at bedtime 06/17/21   [name] F, FNP  Insulin Pen Needle (RELION PEN NEEDLES) 31G X 6 MM MISC Use with insulin daily Dx E11.65 08/09/21   [name], FNP  lisinopril (ZESTRIL) 10 MG tablet Take 1 tablet by mouth once daily 09/28/21   [name], FNP  lovastatin (MEVACOR) 40 MG  tablet Take 1.5 tablets (60 mg total) by mouth at bedtime. 05/12/21   [name], FNP  Menthol, Topical Analgesic, (BIOFREEZE) 4 % GEL Apply 1 application. topically daily as needed (pain).    [provider]  methocarbamol (ROBAXIN) 500 MG tablet Take 1 tablet (500 mg total) by mouth every 8 (eight) hours as needed for muscle spasms. 07/06/21   [name], FNP      Allergies    Patient has no known allergies.    Review of Systems   Review of Systems  Constitutional:  Positive for fatigue. Negative for appetite change, chills and fever.  HENT:  Positive for sneezing. Negative for ear pain, sore throat and trouble swallowing.   Respiratory:  Positive for cough. Negative for chest tightness, shortness of breath and wheezing.   Cardiovascular:  Negative for chest pain.  Gastrointestinal:  Positive for diarrhea. Negative for abdominal pain, nausea and vomiting.  Genitourinary:  Negative for dysuria.  Musculoskeletal:  Negative for neck pain and neck stiffness.  Skin:  Negative for rash.  Neurological:  Negative for dizziness, weakness, numbness and headaches.  Psychiatric/Behavioral:  Negative for confusion.     Physical Exam Updated Vital Signs BP 132/73 (BP Location: Right Arm)   Pulse 73   Temp 100.3 F (37.9 C) (Oral)   Resp 18   Ht '5\' 6"'$  (1.676 m)   Wt 94.8 kg   LMP 03/16/2012   SpO2 99%   BMI 33.73 kg/m  Physical Exam Vitals and nursing note reviewed.  Constitutional:      General: She is not in acute distress.    Appearance: Normal appearance. She is not toxic-appearing.  HENT:     Mouth/Throat:     Mouth: Mucous membranes are moist.     Pharynx: Oropharynx is clear. No oropharyngeal exudate or posterior oropharyngeal erythema.  Cardiovascular:     Rate and Rhythm: Normal rate and regular rhythm.     Pulses: Normal pulses.  Pulmonary:     Effort: Pulmonary effort is normal. No respiratory distress.     Breath sounds: No rales.  Chest:     Chest  wall: No tenderness.  Musculoskeletal:     Right lower leg: No edema.     Left lower leg: No edema.  Lymphadenopathy:     Cervical: No cervical adenopathy.  Skin:    General: Skin is warm.     Capillary Refill: Capillary refill takes less than 2 seconds.     Findings: No rash.  Neurological:     General: No focal deficit present.     Mental Status: She is alert.     Sensory: No sensory deficit.     Motor: No weakness.     ED Results / Procedures / Treatments   Labs (all labs ordered are listed, but only abnormal results are displayed) Labs Reviewed  RESP PANEL BY RT-PCR (FLU A&B, COVID) [id number]    EKG None  Radiology DG Chest  2 View  Result Date: 10/16/2021 CLINICAL DATA:  Cough, fever EXAM: CHEST - 2 VIEW COMPARISON:  Radiographs 05/03/2018 FINDINGS: No focal consolidation, pleural effusion, or pneumothorax. Normal cardiomediastinal silhouette. No acute osseous abnormality. IMPRESSION: No active cardiopulmonary disease. Electronically Signed   By: Placido Sou M.D.   On: 10/16/2021 17:14    Procedures Procedures  {Document cardiac monitor, telemetry assessment procedure when appropriate:1}  Medications Ordered in ED Medications - No data to display  ED Course/ Medical Decision Making/ A&P                           Medical Decision Making Patient here for evaluation of cough x1 week with sneezing and generalized weakness that began yesterday.  Loose stools today.  Denies chest pain or shortness of breath.  Possible COVID exposure per patient's sister although patient denies any known exposures.  She is fully vaccinated.  On exam, patient well-appearing nontoxic.  Low-grade fever without tachycardia tachypnea or hypoxia.  Differential at this time would include but not limited to viral process, pneumonia, PE, ACS  Amount and/or Complexity of Data Reviewed Labs: ordered.    Details: COVID and flu test Radiology: ordered.    Details: Chest x-ray shows no active  cardiopulmonary disease.   ***  {Document critical care time when appropriate:1} {Document review of labs and clinical decision tools ie heart score, Chads2Vasc2 etc:1}  {Document your independent review of radiology images, and any outside records:1} {Document your discussion with family members, caretakers, and with consultants:1} {Document social determinants of health affecting pt's care:1} {Document your decision making why or why not admission, treatments were needed:1} Final Clinical Impression(s) / ED Diagnoses Final diagnoses:  None    Rx / DC Orders ED Discharge Orders     None

## 2021-10-16 NOTE — Discharge Instructions (Signed)
Your COVID test today was positive.  I recommend that you isolate at home and wear a mask while around others.  You may take Tylenol if needed for fever and/or body aches.  Start the antiviral medication as soon as possible.  Please follow-up with your primary care provider for recheck or return to the emergency department for any new or worsening symptoms.

## 2021-10-26 ENCOUNTER — Other Ambulatory Visit: Payer: Self-pay | Admitting: Family Medicine

## 2021-10-26 DIAGNOSIS — E1159 Type 2 diabetes mellitus with other circulatory complications: Secondary | ICD-10-CM

## 2021-10-28 ENCOUNTER — Other Ambulatory Visit: Payer: Self-pay | Admitting: Family Medicine

## 2021-10-28 DIAGNOSIS — E1159 Type 2 diabetes mellitus with other circulatory complications: Secondary | ICD-10-CM

## 2021-11-04 DIAGNOSIS — Z794 Long term (current) use of insulin: Secondary | ICD-10-CM

## 2021-11-04 DIAGNOSIS — E1165 Type 2 diabetes mellitus with hyperglycemia: Secondary | ICD-10-CM

## 2021-11-04 DIAGNOSIS — E1122 Type 2 diabetes mellitus with diabetic chronic kidney disease: Secondary | ICD-10-CM

## 2021-11-04 DIAGNOSIS — N183 Chronic kidney disease, stage 3 unspecified: Secondary | ICD-10-CM

## 2021-11-10 DIAGNOSIS — Z905 Acquired absence of kidney: Secondary | ICD-10-CM | POA: Diagnosis not present

## 2021-11-10 DIAGNOSIS — I129 Hypertensive chronic kidney disease with stage 1 through stage 4 chronic kidney disease, or unspecified chronic kidney disease: Secondary | ICD-10-CM | POA: Diagnosis not present

## 2021-11-10 DIAGNOSIS — E1129 Type 2 diabetes mellitus with other diabetic kidney complication: Secondary | ICD-10-CM | POA: Diagnosis not present

## 2021-11-10 DIAGNOSIS — R809 Proteinuria, unspecified: Secondary | ICD-10-CM | POA: Diagnosis not present

## 2021-11-10 DIAGNOSIS — E1122 Type 2 diabetes mellitus with diabetic chronic kidney disease: Secondary | ICD-10-CM | POA: Diagnosis not present

## 2021-11-10 DIAGNOSIS — N19 Unspecified kidney failure: Secondary | ICD-10-CM | POA: Diagnosis not present

## 2021-11-10 DIAGNOSIS — N189 Chronic kidney disease, unspecified: Secondary | ICD-10-CM | POA: Diagnosis not present

## 2021-11-10 DIAGNOSIS — E875 Hyperkalemia: Secondary | ICD-10-CM | POA: Diagnosis not present

## 2021-11-15 ENCOUNTER — Telehealth: Payer: Self-pay | Admitting: Family Medicine

## 2021-11-17 ENCOUNTER — Other Ambulatory Visit: Payer: Self-pay | Admitting: Family Medicine

## 2021-11-17 DIAGNOSIS — R809 Proteinuria, unspecified: Secondary | ICD-10-CM | POA: Diagnosis not present

## 2021-11-17 DIAGNOSIS — E875 Hyperkalemia: Secondary | ICD-10-CM | POA: Diagnosis not present

## 2021-11-17 DIAGNOSIS — I129 Hypertensive chronic kidney disease with stage 1 through stage 4 chronic kidney disease, or unspecified chronic kidney disease: Secondary | ICD-10-CM | POA: Diagnosis not present

## 2021-11-17 DIAGNOSIS — N189 Chronic kidney disease, unspecified: Secondary | ICD-10-CM | POA: Diagnosis not present

## 2021-11-17 DIAGNOSIS — E8722 Chronic metabolic acidosis: Secondary | ICD-10-CM | POA: Diagnosis not present

## 2021-11-17 DIAGNOSIS — Z905 Acquired absence of kidney: Secondary | ICD-10-CM | POA: Diagnosis not present

## 2021-11-17 DIAGNOSIS — D638 Anemia in other chronic diseases classified elsewhere: Secondary | ICD-10-CM | POA: Diagnosis not present

## 2021-11-17 DIAGNOSIS — E211 Secondary hyperparathyroidism, not elsewhere classified: Secondary | ICD-10-CM | POA: Diagnosis not present

## 2021-11-17 DIAGNOSIS — I152 Hypertension secondary to endocrine disorders: Secondary | ICD-10-CM

## 2021-11-17 NOTE — Telephone Encounter (Signed)
Pt says she does need refill on her HCTZ but found her Atenolol Rx so she doesn't need refill on that one.

## 2021-11-19 DIAGNOSIS — M1711 Unilateral primary osteoarthritis, right knee: Secondary | ICD-10-CM | POA: Diagnosis not present

## 2021-12-07 DIAGNOSIS — M1711 Unilateral primary osteoarthritis, right knee: Secondary | ICD-10-CM | POA: Diagnosis not present

## 2021-12-14 DIAGNOSIS — M1711 Unilateral primary osteoarthritis, right knee: Secondary | ICD-10-CM | POA: Diagnosis not present

## 2021-12-14 DIAGNOSIS — N19 Unspecified kidney failure: Secondary | ICD-10-CM | POA: Diagnosis not present

## 2021-12-14 DIAGNOSIS — I129 Hypertensive chronic kidney disease with stage 1 through stage 4 chronic kidney disease, or unspecified chronic kidney disease: Secondary | ICD-10-CM | POA: Diagnosis not present

## 2021-12-14 DIAGNOSIS — E211 Secondary hyperparathyroidism, not elsewhere classified: Secondary | ICD-10-CM | POA: Diagnosis not present

## 2021-12-14 DIAGNOSIS — E875 Hyperkalemia: Secondary | ICD-10-CM | POA: Diagnosis not present

## 2021-12-14 DIAGNOSIS — N189 Chronic kidney disease, unspecified: Secondary | ICD-10-CM | POA: Diagnosis not present

## 2021-12-14 DIAGNOSIS — E1122 Type 2 diabetes mellitus with diabetic chronic kidney disease: Secondary | ICD-10-CM | POA: Diagnosis not present

## 2021-12-14 DIAGNOSIS — R809 Proteinuria, unspecified: Secondary | ICD-10-CM | POA: Diagnosis not present

## 2021-12-14 DIAGNOSIS — E1129 Type 2 diabetes mellitus with other diabetic kidney complication: Secondary | ICD-10-CM | POA: Diagnosis not present

## 2021-12-20 ENCOUNTER — Ambulatory Visit: Payer: Medicare HMO | Admitting: Nurse Practitioner

## 2021-12-21 DIAGNOSIS — N184 Chronic kidney disease, stage 4 (severe): Secondary | ICD-10-CM | POA: Diagnosis not present

## 2021-12-21 DIAGNOSIS — N2581 Secondary hyperparathyroidism of renal origin: Secondary | ICD-10-CM | POA: Diagnosis not present

## 2021-12-21 DIAGNOSIS — E1129 Type 2 diabetes mellitus with other diabetic kidney complication: Secondary | ICD-10-CM | POA: Diagnosis not present

## 2021-12-21 DIAGNOSIS — M1711 Unilateral primary osteoarthritis, right knee: Secondary | ICD-10-CM | POA: Diagnosis not present

## 2021-12-21 DIAGNOSIS — E211 Secondary hyperparathyroidism, not elsewhere classified: Secondary | ICD-10-CM | POA: Diagnosis not present

## 2021-12-21 DIAGNOSIS — E1122 Type 2 diabetes mellitus with diabetic chronic kidney disease: Secondary | ICD-10-CM | POA: Diagnosis not present

## 2021-12-21 DIAGNOSIS — N189 Chronic kidney disease, unspecified: Secondary | ICD-10-CM | POA: Diagnosis not present

## 2021-12-21 DIAGNOSIS — Z905 Acquired absence of kidney: Secondary | ICD-10-CM | POA: Diagnosis not present

## 2021-12-21 DIAGNOSIS — Z6834 Body mass index (BMI) 34.0-34.9, adult: Secondary | ICD-10-CM | POA: Diagnosis not present

## 2021-12-21 DIAGNOSIS — R809 Proteinuria, unspecified: Secondary | ICD-10-CM | POA: Diagnosis not present

## 2021-12-21 DIAGNOSIS — I129 Hypertensive chronic kidney disease with stage 1 through stage 4 chronic kidney disease, or unspecified chronic kidney disease: Secondary | ICD-10-CM | POA: Diagnosis not present

## 2021-12-27 ENCOUNTER — Ambulatory Visit (INDEPENDENT_AMBULATORY_CARE_PROVIDER_SITE_OTHER): Payer: Medicare HMO | Admitting: Nurse Practitioner

## 2021-12-27 ENCOUNTER — Encounter: Payer: Self-pay | Admitting: Nurse Practitioner

## 2021-12-27 ENCOUNTER — Other Ambulatory Visit: Payer: Self-pay | Admitting: Family Medicine

## 2021-12-27 VITALS — BP 152/74 | HR 79 | Temp 98.6°F | Ht 66.0 in | Wt 206.0 lb

## 2021-12-27 DIAGNOSIS — E1169 Type 2 diabetes mellitus with other specified complication: Secondary | ICD-10-CM

## 2021-12-27 DIAGNOSIS — E785 Hyperlipidemia, unspecified: Secondary | ICD-10-CM | POA: Diagnosis not present

## 2021-12-27 DIAGNOSIS — E1165 Type 2 diabetes mellitus with hyperglycemia: Secondary | ICD-10-CM

## 2021-12-27 DIAGNOSIS — Z794 Long term (current) use of insulin: Secondary | ICD-10-CM | POA: Diagnosis not present

## 2021-12-27 DIAGNOSIS — R21 Rash and other nonspecific skin eruption: Secondary | ICD-10-CM

## 2021-12-27 DIAGNOSIS — E538 Deficiency of other specified B group vitamins: Secondary | ICD-10-CM | POA: Diagnosis not present

## 2021-12-27 DIAGNOSIS — I152 Hypertension secondary to endocrine disorders: Secondary | ICD-10-CM

## 2021-12-27 DIAGNOSIS — E1159 Type 2 diabetes mellitus with other circulatory complications: Secondary | ICD-10-CM | POA: Diagnosis not present

## 2021-12-27 DIAGNOSIS — E669 Obesity, unspecified: Secondary | ICD-10-CM | POA: Diagnosis not present

## 2021-12-27 DIAGNOSIS — J069 Acute upper respiratory infection, unspecified: Secondary | ICD-10-CM | POA: Diagnosis not present

## 2021-12-27 LAB — BAYER DCA HB A1C WAIVED: HB A1C (BAYER DCA - WAIVED): 7.1 % — ABNORMAL HIGH (ref 4.8–5.6)

## 2021-12-27 MED ORDER — GUAIFENESIN ER 600 MG PO TB12: 600.0000 mg | ORAL_TABLET | Freq: Two times a day (BID) | ORAL | 0 refills | Status: DC

## 2021-12-27 MED ORDER — TRIAMCINOLONE ACETONIDE 0.1 % EX CREA: 1.0000 | TOPICAL_CREAM | Freq: Two times a day (BID) | CUTANEOUS | 0 refills | Status: DC

## 2021-12-27 MED ORDER — PREDNISONE 20 MG PO TABS: 20.0000 mg | ORAL_TABLET | Freq: Every day | ORAL | 0 refills | Status: DC

## 2021-12-27 MED ORDER — AMOXICILLIN-POT CLAVULANATE 875-125 MG PO TABS: 1.0000 | ORAL_TABLET | Freq: Two times a day (BID) | ORAL | 0 refills | Status: DC

## 2021-12-27 MED ORDER — ATENOLOL 100 MG PO TABS: 100.0000 mg | ORAL_TABLET | Freq: Every morning | ORAL | 1 refills | Status: DC

## 2021-12-27 MED ORDER — HYDROCHLOROTHIAZIDE 25 MG PO TABS: 25.0000 mg | ORAL_TABLET | Freq: Every day | ORAL | 1 refills | Status: DC

## 2021-12-27 MED ORDER — BENZONATATE 200 MG PO CAPS: 200.0000 mg | ORAL_CAPSULE | Freq: Three times a day (TID) | ORAL | 0 refills | Status: DC | PRN

## 2021-12-27 MED ORDER — AMLODIPINE BESYLATE 10 MG PO TABS: 10.0000 mg | ORAL_TABLET | Freq: Every day | ORAL | 1 refills | Status: DC

## 2021-12-27 MED ORDER — LISINOPRIL 10 MG PO TABS: 10.0000 mg | ORAL_TABLET | Freq: Every day | ORAL | 1 refills | Status: DC

## 2021-12-27 NOTE — Progress Notes (Signed)
Established Patient Office Visit  Subjective   Patient ID: Brooke [name], female    DOB: [birth year]  Age: 66 y.o. MRN: [id number]  Chief Complaint  Patient presents with   Medical Management of Chronic Issues   Wheezing    Couple weeks    Nasal Congestion   Cough    Coughing up clear     URI  This is a new problem. The current episode started in the past 7 days (in the past 4-6 days). The problem has been gradually worsening. There has been no fever. Associated symptoms include congestion, coughing, a rash and wheezing. She has tried nothing for the symptoms.  Rash This is a recurrent problem. The current episode started more than 1 month ago. The problem is unchanged. The affected locations include the back. The rash is characterized by dryness, redness and itchiness. She was exposed to nothing. Associated symptoms include congestion and coughing. Pertinent negatives include no shortness of breath. Past treatments include nothing.   The patient presents with history of type II diabetes mellitus without complications. Patient was diagnosed in 03/13/2019. Compliance with treatment has been good; the patient takes medication as directed , maintains a diabetic diet and an exercise regimen , follows up as directed , and is keeping a glucose diary. Sugars runs 120-130 before meals and 150-190 after meals. Patient specifically denies associated symptoms, including blurred vision, fatigue, polydipsia, polyphagia and polyuria . Patient denies hypoglycemia. In regard to preventative care, the patient performs foot self-exams daily and last ophthalmology exam was in about a year ago.   Mixed hyperlipidemia  Pt presents with hyperlipidemia. Patient was diagnosed in 03/13/2019.  Compliance with treatment has been good.  The patient is compliant with medications, maintains a low cholesterol diet , follows up as directed , and maintains an exercise regimen . The patient denies experiencing any  hypercholesterolemia related symptoms.      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   Thickened endometrium 03/24/2021   Vaginal bleeding 03/24/2021   Difficulty sleeping 02/10/2021   Positive colorectal cancer screening using Cologuard test 12/10/2020   Vitamin D insufficiency 11/27/2020   Diabetic nephropathy ([place])    CKD stage 3 due to type 2 diabetes mellitus ([place]) 03/14/2019   Type 2 diabetes mellitus with hyperglycemia, with long-term current use of insulin ([place]) 03/13/2019   Hyperlipidemia associated with type 2 diabetes mellitus ([place]) 03/13/2019   Hypertension associated with diabetes ([place]) 03/13/2019   Obesity (BMI 30-39.9) 03/13/2019   Chronic bronchitis ([place]) 03/13/2019   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   Hypertension    Hyperuricemia 11/27/2020   Obesity (BMI 35.0-39.9 without comorbidity)    Renal mass 08/04/2021   Vertigo    Vitamin D insufficiency 11/27/2020   Past Surgical History:  Procedure Laterality Date   BREAST SURGERY Right 1974   OPERATIVE ULTRASOUND N/A 08/04/2021   Procedure: OPERATIVE ULTRASOUND;  Surgeon: Alexis Frock, MD;  Location: [facility];  Service: Urology;  Laterality: N/A;   ROBOTIC ASSITED PARTIAL NEPHRECTOMY Right 08/04/2021   Procedure: XI ROBOTIC ASSITED RADICAL NEPHRECTOMY;  Surgeon: Alexis Frock,  MD;  Location: [facility];  Service: Urology;  Laterality: Right;   Social History   Tobacco Use   Smoking status: Never   Smokeless tobacco: Never  Vaping Use   Vaping Use: Never used  Substance Use Topics   Alcohol use: No   Drug use: No   Social History   Socioeconomic History   Marital  status: Married    Spouse name: Donnie   Number of children: 1   Years of education: Not on file   Highest education level: Not on file  Occupational History   Not on file  Tobacco Use   Smoking status: Never   Smokeless tobacco: Never  Vaping Use   Vaping Use: Never used  Substance and Sexual Activity   Alcohol use: No   Drug use: No   Sexual activity: Never  Other Topics Concern   Not on file  Social History Narrative   1 daughter    1 grandson   Social Determinants of Health   Financial Resource Strain: Medium Risk (02/19/2021)   Overall Financial Resource Strain (CARDIA)    Difficulty of Paying Living Expenses: Somewhat hard  Food Insecurity: Food Insecurity Present (02/19/2021)   Hunger Vital Sign    Worried About Running Out of Food in the Last Year: Sometimes true    Ran Out of Food in the Last Year: Sometimes true  Transportation Needs: No Transportation Needs (02/01/2021)   PRAPARE - Hydrologist (Medical): No    Lack of Transportation (Non-Medical): No  Physical Activity: Inactive (02/01/2021)   Exercise Vital Sign    Days of Exercise per Week: 0 days    Minutes of Exercise per Session: 0 min  Stress: No Stress Concern Present (02/01/2021)   [place]    Feeling of Stress : Only a little  Social Connections: Socially Integrated (02/01/2021)   Social Connection and Isolation Panel [NHANES]    Frequency of Communication with Friends and Family: More than three times a week    Frequency of Social Gatherings with Friends and Family: More than three times a week    Attends Religious Services: More than 4 times per year    Active Member of [employer] or Organizations: Yes    Attends Music therapist: More than 4 times per year    Marital Status: Married  Human resources officer Violence: Not At Risk (02/01/2021)   Humiliation, Afraid, Rape, and Kick questionnaire     Fear of Current or Ex-Partner: No    Emotionally Abused: No    Physically Abused: No    Sexually Abused: No   Family Status  Relation Name Status   Mother  Deceased   Father  Deceased   Sister  Alive   Brother  Alive   Daughter  Alive   Sister  Deceased   Family History  Problem Relation Age of Onset   Diabetes Mother    Congestive Heart Failure Mother    Kidney disease Father    Diabetes Sister    Diabetes Brother    Hypertension Daughter    Hypertension Sister    Cervical cancer Sister    No Known Allergies    Review of Systems  Constitutional: Negative.   HENT:  Positive for congestion.   Eyes: Negative.   Respiratory:  Positive for cough and wheezing. Negative for shortness of breath.   Cardiovascular: Negative.   Gastrointestinal: Negative.   Genitourinary: Negative.  Musculoskeletal: Negative.   Skin:  Positive for rash. Negative for itching.  All other systems reviewed and are negative.     Objective:     BP (!) 152/74   Pulse 79   Temp 98.6 F (37 C)   Ht _0  (1.676 m)   Wt 206 lb (93.4 kg)   LMP 03/16/2012   SpO2 95%   BMI 33.25 kg/m  BP Readings from Last 3 Encounters:  12/27/21 (!) 152/74  10/16/21 127/69  09/20/21 125/65   Wt Readings from Last 3 Encounters:  12/27/21 206 lb (93.4 kg)  10/16/21 209 lb (94.8 kg)  09/20/21 209 lb 9.6 oz (95.1 kg)      Physical Exam Vitals and nursing note reviewed.  Constitutional:      Appearance: Normal appearance.  HENT:     Head: Normocephalic.     Right Ear: External ear normal.     Left Ear: External ear normal.     Nose: Nose normal.  Cardiovascular:     Rate and Rhythm: Normal rate and regular rhythm.     Pulses: Normal pulses.     Heart sounds: Normal heart sounds.  Pulmonary:     Effort: Pulmonary effort is normal.     Breath sounds: Normal breath sounds.  Abdominal:     General: Bowel sounds are normal.  Skin:    General: Skin is warm.     Findings: Rash present. No  erythema. Rash is urticarial.          Comments: Rash mid back  Neurological:     General: No focal deficit present.     Mental Status: She is alert and oriented to person, place, and time.  Psychiatric:        Mood and Affect: Mood normal.        Behavior: Behavior normal.      No results found for any visits on 12/27/21.  Last CBC Lab Results  Component Value Date   WBC 8.5 09/17/2021   HGB 11.5 09/17/2021   HCT 36.3 09/17/2021   MCV 82 09/17/2021   MCH 26.0 (L) 09/17/2021   RDW 14.1 09/17/2021   PLT 414 23/36/1224   Last metabolic panel Lab Results  Component Value Date   GLUCOSE 118 (H) 09/17/2021   NA 137 09/17/2021   K 5.1 09/17/2021   CL 106 09/17/2021   CO2 18 (L) 09/17/2021   BUN 42 (H) 09/17/2021   CREATININE 2.86 (H) 09/17/2021   EGFR 17 (L) 09/17/2021   CALCIUM 9.0 09/17/2021   PROT 7.0 09/17/2021   ALBUMIN 4.1 09/17/2021   LABGLOB 2.9 09/17/2021   AGRATIO 1.4 09/17/2021   BILITOT 0.3 09/17/2021   ALKPHOS 96 09/17/2021   AST 14 09/17/2021   ALT 12 09/17/2021   ANIONGAP 8 08/06/2021   Last lipids Lab Results  Component Value Date   CHOL 189 09/17/2021   HDL 36 (L) 09/17/2021   LDLCALC 111 (H) 09/17/2021   TRIG 241 (H) 09/17/2021   CHOLHDL 5.3 (H) 09/17/2021   Last hemoglobin A1c Lab Results  Component Value Date   HGBA1C 7.8 (H) 09/17/2021   Last thyroid functions Lab Results  Component Value Date   TSH 1.880 03/13/2019   T4TOTAL 9.1 03/13/2019   Last vitamin D No results found for: "25OHVITD2", "25OHVITD3", "VD25OH" triamcinolone Last vitamin B12 and Folate No results found for: "VITAMINB12", "FOLATE"    The 10-year ASCVD risk score (Arnett DK, et al., 2019) is: 31.4%  Assessment & Plan:   Mid back rash advised patient to avoid hot showers, avoid scratching with nails, triamcinolone topical cream.  Follow-up with unresolved symptoms. Problem List Items Addressed This Visit       Cardiovascular and Mediastinum    Hypertension associated with diabetes ([place])   Relevant Medications   amLODipine (NORVASC) 10 MG tablet   atenolol (TENORMIN) 100 MG tablet   hydrochlorothiazide (HYDRODIURIL) 25 MG tablet   lisinopril (ZESTRIL) 10 MG tablet     Endocrine   Type 2 diabetes mellitus with hyperglycemia, with long-term current use of insulin (HCC) - Primary    Diabetes well controlled on current regimen no changes necessary.  Completed labs results pending.  Follow-up in 3 months.  Encourage patient to continue diabetic diet, exercise and weight loss.      Relevant Medications   lisinopril (ZESTRIL) 10 MG tablet   Other Relevant Orders   CBC with Differential/Platelet   CMP14+EGFR   Bayer DCA Hb A1c Waived   Hyperlipidemia associated with type 2 diabetes mellitus ([place])    Cholesterol well controlled on current regimen.  No changes necessary. Completed labs results pending. Follow-up in 3 to 6 months.      Relevant Medications   amLODipine (NORVASC) 10 MG tablet   atenolol (TENORMIN) 100 MG tablet   hydrochlorothiazide (HYDRODIURIL) 25 MG tablet   lisinopril (ZESTRIL) 10 MG tablet   Other Relevant Orders   Lipid panel     Other   Obesity (BMI 30-39.9)    Encouraged diet and exercise.      Relevant Medications   sodium bicarbonate 650 MG tablet   Other Visit Diagnoses     B12 deficiency       Relevant Orders   Vitamin B12   Upper respiratory tract infection, unspecified type       Relevant Medications   amoxicillin-clavulanate (AUGMENTIN) 875-125 MG tablet   predniSONE (DELTASONE) 20 MG tablet   guaiFENesin (MUCINEX) 600 MG 12 hr tablet   benzonatate (TESSALON) 200 MG capsule   Rash       Relevant Medications   triamcinolone cream (KENALOG) 0.1 %       Return in about 3 months (around 03/29/2022) for chronic disease management.    Ivy Lynn, NP

## 2021-12-27 NOTE — Patient Instructions (Signed)
Upper Respiratory Infection, Adult An upper respiratory infection (URI) affects the nose, throat, and upper airways that lead to the lungs. The most common type of URI is often called the common cold. URIs usually get better on their own, without medical treatment. What are the causes? A URI is caused by a germ (virus). You may catch these germs by: Breathing in droplets from an infected person's cough or sneeze. Touching something that has the germ on it (is contaminated) and then touching your mouth, nose, or eyes. What increases the risk? You are more likely to get a URI if: You are very young or very old. You have close contact with others, such as at work, school, or a health care facility. You smoke. You have long-term (chronic) heart or lung disease. You have a weakened disease-fighting system (immune system). You have nasal allergies or asthma. You have a lot of stress. You have poor nutrition. What are the signs or symptoms? Runny or stuffy (congested) nose. Cough. Sneezing. Sore throat. Headache. Feeling tired (fatigue). Fever. Not wanting to eat as much as usual. Pain in your forehead, behind your eyes, and over your cheekbones (sinus pain). Muscle aches. Redness or irritation of the eyes. Pressure in the ears or face. How is this treated? URIs usually get better on their own within 7-10 days. Medicines cannot cure URIs, but your doctor may recommend certain medicines to help relieve symptoms, such as: Over-the-counter cold medicines. Medicines to reduce coughing (cough suppressants). Coughing is a type of defense against infection that helps to clear the nose, throat, windpipe, and lungs (respiratory system). Take these medicines only as told by your doctor. Medicines to lower your fever. Follow these instructions at home: Activity Rest as needed. If you have a fever, stay home from work or school until your fever is gone, or until your doctor says you may return to  work or school. You should stay home until you cannot spread the infection anymore (you are not contagious). Your doctor may have you wear a face mask so you have less risk of spreading the infection. Relieving symptoms Rinse your mouth often with salt water. To make salt water, dissolve -1 tsp (3-6 g) of salt in 1 cup (237 mL) of warm water. Use a cool-mist humidifier to add moisture to the air. This can help you breathe more easily. Eating and drinking  Drink enough fluid to keep your pee (urine) pale yellow. Eat soups and other clear broths. General instructions  Take over-the-counter and prescription medicines only as told by your doctor. Do not smoke or use any products that contain nicotine or tobacco. If you need help quitting, ask your doctor. Avoid being where people are smoking (avoid secondhand smoke). Stay up to date on all your shots (immunizations), and get the flu shot every year. Keep all follow-up visits. How to prevent the spread of infection to others  Wash your hands with soap and water for at least 20 seconds. If you cannot use soap and water, use hand sanitizer. Avoid touching your mouth, face, eyes, or nose. Cough or sneeze into a tissue or your sleeve or elbow. Do not cough or sneeze into your hand or into the air. Contact a doctor if: You are getting worse, not better. You have any of these: A fever or chills. Brown or red mucus in your nose. Yellow or brown fluid (discharge)coming from your nose. Pain in your face, especially when you bend forward. Swollen neck glands. Pain when you swallow.   White areas in the back of your throat. Get help right away if: You have shortness of breath that gets worse. You have very bad or constant: Headache. Ear pain. Pain in your forehead, behind your eyes, and over your cheekbones (sinus pain). Chest pain. You have long-lasting (chronic) lung disease along with any of these: Making high-pitched whistling sounds when  you breathe, most often when you breathe out (wheezing). Long-lasting cough (more than 14 days). Coughing up blood. A change in your usual mucus. You have a stiff neck. You have changes in your: Vision. Hearing. Thinking. Mood. These symptoms may be an emergency. Get help right away. Call 911. Do not wait to see if the symptoms will go away. Do not drive yourself to the hospital. Summary An upper respiratory infection (URI) is caused by a germ (virus). The most common type of URI is often called the common cold. URIs usually get better within 7-10 days. Take over-the-counter and prescription medicines only as told by your doctor. This information is not intended to replace advice given to you by your health care provider. Make sure you discuss any questions you have with your health care provider. Document Revised: 09/23/2020 Document Reviewed: 09/23/2020 Elsevier Patient Education  [place]. Diabetes Mellitus Action Plan Following a diabetes action plan is a way for you to manage your diabetes (diabetes mellitus) symptoms. The plan is color-coded to help you understand what actions you need to take based on any symptoms you are having. If you have symptoms in the red zone, you need medical care right away. If you have symptoms in the yellow zone, you are having problems. If you have symptoms in the green zone, you are doing well. Learning about and understanding diabetes can take time. Follow the plan that you develop with your health care provider. Know the target range for your blood sugar (glucose) level, and review your treatment plan with your health care provider at each visit. The target range for my blood sugar level is __________________________ mg/dL. Red zone Get medical help right away if you have any of the following symptoms: A blood sugar test result that is below 54 mg/dL (3 mmol/L). A blood sugar test result that is at or above 240 mg/dL (13.3 mmol/L) for 2  days in a row. Confusion or trouble thinking clearly. Difficulty breathing. Sickness or a fever for 2 or more days that is not getting better. Moderate or large ketone levels in your urine. Feeling tired or having no energy. If you have any red zone symptoms, do not wait to see if the symptoms will go away. Get medical help right away. Call your local emergency services (911 in the [place] Do not drive yourself to the hospital. If you have severely low blood sugar (severe hypoglycemia) and you cannot eat or drink, you may need glucagon. Make sure a family member or close friend knows how to check your blood sugar and how to give you glucagon. You may need to be treated in a hospital for this condition. Yellow zone If you have any of the following symptoms, your diabetes is not under control and you may need to make some changes: A blood sugar test result that is at or above 240 mg/dL (13.3 mmol/L) for 2 days in a row. Blood sugar test results that are below 70 mg/dL (3.9 mmol/L). Other symptoms of hypoglycemia, such as: Shaking or feeling light-headed. Confusion or irritability. Feeling hungry. Having a fast heartbeat. If you have any yellow  zone symptoms: Treat your hypoglycemia by eating or drinking 15 grams of a rapid-acting carbohydrate. Follow the 15:15 rule: Take 15 grams of a rapid-acting carbohydrate, such as: 1 tube of glucose gel. 4 glucose pills. 4 oz (120 mL) of fruit juice. 4 oz (120 mL) of regular (not diet) soda. Check your blood sugar 15 minutes after you take the carbohydrate. If the repeat blood sugar test is still at or below 70 mg/dL (3.9 mmol/L), take 15 grams of a carbohydrate again. If your blood sugar does not increase above 70 mg/dL (3.9 mmol/L) after 3 tries, get medical help right away. After your blood sugar returns to normal, eat a meal or a snack within 1 hour. Keep taking your daily medicines as told by your health care provider. Check your blood sugar more  often than you normally would. Write down your results. Call your health care provider if you have trouble keeping your blood sugar in your target range.  Green zone These signs mean you are doing well and you can continue what you are doing to manage your diabetes: Your blood sugar is within your personal target range. For most people, a blood sugar level before a meal (preprandial) should be 80-130 mg/dL (4.4-7.2 mmol/L). You feel well, and you are able to do daily activities. If you are in the green zone, continue to manage your diabetes as told by your health care provider. To do this: Eat a healthy diet. Exercise regularly. Check your blood sugar as told by your health care provider. Take your medicines as told by your health care provider.  Where to find more information American Diabetes Association (ADA): diabetes.org Association of Diabetes Care & Education Specialists (ADCES): diabeteseducator.org Summary Following a diabetes action plan is a way for you to manage your diabetes symptoms. The plan is color-coded to help you understand what actions you need to take based on any symptoms you are having. Follow the plan that you develop with your health care provider. Make sure you know your personal target blood sugar level. Review your treatment plan with your health care provider at each visit. This information is not intended to replace advice given to you by your health care provider. Make sure you discuss any questions you have with your health care provider. Document Revised: 08/29/2019 Document Reviewed: 08/29/2019 Elsevier Patient Education  [place].

## 2021-12-27 NOTE — Assessment & Plan Note (Signed)
Cholesterol well controlled on current regimen.  No changes necessary. Completed labs results pending. Follow-up in 3 to 6 months.

## 2021-12-27 NOTE — Assessment & Plan Note (Signed)
Diabetes well controlled on current regimen no changes necessary.  Completed labs results pending.  Follow-up in 3 months.  Encourage patient to continue diabetic diet, exercise and weight loss.

## 2021-12-27 NOTE — Assessment & Plan Note (Signed)
Encouraged diet and exercise.  

## 2021-12-28 ENCOUNTER — Other Ambulatory Visit: Payer: Self-pay | Admitting: Nurse Practitioner

## 2021-12-28 DIAGNOSIS — R7989 Other specified abnormal findings of blood chemistry: Secondary | ICD-10-CM

## 2021-12-28 LAB — CBC WITH DIFFERENTIAL/PLATELET
Basophils Absolute: 0.1 10*3/uL (ref 0.0–0.2)
Basos: 1 %
EOS (ABSOLUTE): 1.3 10*3/uL — ABNORMAL HIGH (ref 0.0–0.4)
Eos: 17 %
Hematocrit: 35.8 % (ref 34.0–46.6)
Hemoglobin: 11.6 g/dL (ref 11.1–15.9)
Immature Grans (Abs): 0 10*3/uL (ref 0.0–0.1)
Immature Granulocytes: 0 %
Lymphocytes Absolute: 2.5 10*3/uL (ref 0.7–3.1)
Lymphs: 32 %
MCH: 26.7 pg (ref 26.6–33.0)
MCHC: 32.4 g/dL (ref 31.5–35.7)
MCV: 83 fL (ref 79–97)
Monocytes Absolute: 0.6 10*3/uL (ref 0.1–0.9)
Monocytes: 7 %
Neutrophils Absolute: 3.4 10*3/uL (ref 1.4–7.0)
Neutrophils: 43 %
Platelets: 374 10*3/uL (ref 150–450)
RBC: 4.34 x10E6/uL (ref 3.77–5.28)
RDW: 14.6 % (ref 11.7–15.4)
WBC: 7.8 10*3/uL (ref 3.4–10.8)

## 2021-12-28 LAB — CMP14+EGFR
ALT: 17 IU/L (ref 0–32)
AST: 17 IU/L (ref 0–40)
Albumin/Globulin Ratio: 1.3 (ref 1.2–2.2)
Albumin: 4.2 g/dL (ref 3.9–4.9)
Alkaline Phosphatase: 88 IU/L (ref 44–121)
BUN/Creatinine Ratio: 11 — ABNORMAL LOW (ref 12–28)
BUN: 37 mg/dL — ABNORMAL HIGH (ref 8–27)
Bilirubin Total: 0.3 mg/dL (ref 0.0–1.2)
CO2: 18 mmol/L — ABNORMAL LOW (ref 20–29)
Calcium: 9 mg/dL (ref 8.7–10.3)
Chloride: 106 mmol/L (ref 96–106)
Creatinine, Ser: 3.22 mg/dL (ref 0.57–1.00)
Globulin, Total: 3.2 g/dL (ref 1.5–4.5)
Glucose: 117 mg/dL — ABNORMAL HIGH (ref 70–99)
Potassium: 4.7 mmol/L (ref 3.5–5.2)
Sodium: 140 mmol/L (ref 134–144)
Total Protein: 7.4 g/dL (ref 6.0–8.5)
eGFR: 15 mL/min/{1.73_m2} — ABNORMAL LOW (ref >59)

## 2021-12-28 LAB — LIPID PANEL
Chol/HDL Ratio: 5.1 ratio — ABNORMAL HIGH (ref 0.0–4.4)
Cholesterol, Total: 179 mg/dL (ref 100–199)
HDL: 35 mg/dL — ABNORMAL LOW (ref >39)
LDL Chol Calc (NIH): 96 mg/dL (ref 0–99)
Triglycerides: 282 mg/dL — ABNORMAL HIGH (ref 0–149)
VLDL Cholesterol Cal: 48 mg/dL — ABNORMAL HIGH (ref 5–40)

## 2021-12-28 LAB — VITAMIN B12: Vitamin B-12: 685 pg/mL (ref 232–1245)

## 2021-12-28 NOTE — Progress Notes (Signed)
I tried to call patient over the phone but kept getting disconnected. I will order a renal US , increase hydration, Urinalysis to rule out kidney stones, obstruction, repeat creatinine in 24 hours. If symptoms or lab values do not improve, will refer patient to Nephrologist for further assessment.

## 2021-12-29 ENCOUNTER — Telehealth: Payer: Self-pay | Admitting: Nurse Practitioner

## 2021-12-29 ENCOUNTER — Other Ambulatory Visit: Payer: Self-pay | Admitting: Family Medicine

## 2021-12-29 NOTE — Telephone Encounter (Signed)
Left vm for cb

## 2021-12-30 NOTE — Telephone Encounter (Signed)
Spoke with pt , she already has a Water quality scientist and is currently being treated

## 2021-12-31 NOTE — Telephone Encounter (Signed)
Great!

## 2022-01-06 ENCOUNTER — Ambulatory Visit (HOSPITAL_COMMUNITY): Payer: Medicare HMO

## 2022-01-12 ENCOUNTER — Telehealth: Payer: Self-pay | Admitting: Pharmacist

## 2022-01-12 ENCOUNTER — Ambulatory Visit (INDEPENDENT_AMBULATORY_CARE_PROVIDER_SITE_OTHER): Payer: Medicare HMO | Admitting: Pharmacist

## 2022-01-12 VITALS — BP 130/75

## 2022-01-12 DIAGNOSIS — Z794 Long term (current) use of insulin: Secondary | ICD-10-CM

## 2022-01-12 DIAGNOSIS — N183 Chronic kidney disease, stage 3 unspecified: Secondary | ICD-10-CM

## 2022-01-12 NOTE — Telephone Encounter (Signed)
Please send re-enrollment paperwork to PCP office and patient: Kerendia/bayer Jardiance/BI Basaglar, humalog, trulicity/[facility] [employer]   Doses in chart correct PCP Jac Canavan , NP     Thank you!!

## 2022-01-12 NOTE — Telephone Encounter (Signed)
Please send re-enrollment paperwork to PCP office and patient: Brooke [name]/bayer Brooke [name]/Brooke [name], Brooke [name], Brooke [name]/[facility]  Doses in chart correct PCP Jac Canavan , NP   Thank you!!

## 2022-01-18 ENCOUNTER — Telehealth: Payer: Self-pay | Admitting: Pharmacist

## 2022-01-18 DIAGNOSIS — E1122 Type 2 diabetes mellitus with diabetic chronic kidney disease: Secondary | ICD-10-CM

## 2022-01-18 DIAGNOSIS — E1165 Type 2 diabetes mellitus with hyperglycemia: Secondary | ICD-10-CM

## 2022-01-18 NOTE — Telephone Encounter (Signed)
New [id number] placed for continuation of CCM services

## 2022-01-19 NOTE — Progress Notes (Signed)
Chronic Care Management Pharmacy Note  01/12/2022 Name:  Brooke [name] MRN:  [id number] DOB:  April 27, 1955  Summary:  Diabetes: CONTROLLED A1c 10.9%--> 7.1%;  much improved over the past year **Important ** -holding lisinopril, jardiance, kerendia per nephro/GFR 15--may need HD in the near future Current treatment: BASAGLAR 03-00 UNITS, TRULICITY 4.'5MG'$ ,; Humalog added for meal times (5-10 units with meals) PAST meds: Metformin was discontinued due to GFR 28-->15 Continues to work on controlling her diabetes Denies personal and family history of Medullary thyroid cancer (MTC) Current glucose readings: fasting glucose: <130, 113 today, post prandial glucose: <200 Patient can't afford Libre CGM--$75 based on 20% copay [employer] DME Denies hypoglycemic/hyperglycemic symptoms Discussed meal planning options and Plate method for healthy eating Avoid sugary drinks and desserts Incorporate balanced protein, non starchy veggies, 1 serving of carbohydrate with each meal Increase water intake Increase physical activity as able Current exercise: N/A Educated on DIABETES MEDICATIONS, BG READINGS; [place] patient finances.  WILL RE-ENROLL:  lilly cares patient assistance for basaglar/trulicity/humalog BI cares (jardiance) [facility] [employer])  Patient Goals/Self-Care Activities Over the next 90 days, patient will:  - take medications as prescribed check glucose DAILY, document, and provide at future appointments  Follow Up Plan: Telephone follow up appointment with care management team member scheduled for: 3 months   Subjective: Brooke [name] is an 66 y.o. year old female who is a primary patient of Ivy Lynn, NP.  The patient was referred to the Chronic Care Management team for assistance with care management needs subsequent to provider initiation of CCM services and plan of care.    Engaged with patient face to face for follow up visit in response to provider referral for  CCM services.   Objective:  LABS:  Lab Results  Component Value Date   CREATININE 3.22 (HH) 12/27/2021   CREATININE 2.86 (H) 09/17/2021   CREATININE 2.90 (H) 08/06/2021     Lab Results  Component Value Date   HGBA1C 7.1 (H) 12/27/2021         Component Value Date/Time   CHOL 179 12/27/2021 1149   TRIG 282 (H) 12/27/2021 1149   HDL 35 (L) 12/27/2021 1149   CHOLHDL 5.1 (H) 12/27/2021 1149   LDLCALC 96 12/27/2021 1149     Clinical ASCVD: No   The 10-year ASCVD risk score (Arnett DK, et al., 2019) is: 22.3%   Values used to calculate the score:     Age: 66 years     Sex: Female     Is Non-Hispanic African American: Yes     Diabetic: Yes     Tobacco smoker: No     Systolic Blood Pressure: 923 mmHg     Is BP treated: Yes     HDL Cholesterol: 35 mg/dL     Total Cholesterol: 179 mg/dL    Other: (CHADS2VASc if Afib, PHQ9 if depression, MMRC or CAT for COPD, ACT, DEXA)    BP Readings from Last 3 Encounters:  01/21/22 130/75  12/27/21 (!) 152/74  10/16/21 127/69      SDOH:  (Social Determinants of Health) assessments and interventions performed:    No Known Allergies  Medications Reviewed Today     Reviewed by Lavera Guise, [facility] (Pharmacist) on 01/12/22 at [place] List Status: <None>   Medication Order Taking? Sig Documenting Provider Last Dose Status Informant  acetaminophen (TYLENOL) 500 MG tablet [id number] No Take 1,000 mg by mouth every 8 (eight) hours as needed for moderate pain. [provider]  Taking Active Self  albuterol (VENTOLIN HFA) 108 (90 Base) MCG/ACT inhaler [id number] No Inhale 2 puffs into the lungs every 6 (six) hours as needed for wheezing or shortness of breath. [name], FNP Taking Active Self  amLODipine (NORVASC) 10 MG tablet [id number]  Take 1 tablet (10 mg total) by mouth daily. Ivy Lynn, NP  Active   amoxicillin-clavulanate (AUGMENTIN) 875-125 MG tablet [id number]  Take 1 tablet by mouth 2 (two) times daily. Ivy Lynn, NP  Active   atenolol (TENORMIN) 100 MG tablet [id number]  Take 1 tablet (100 mg total) by mouth every morning. Ivy Lynn, NP  Active   benzonatate (TESSALON) 200 MG capsule [id number]  Take 1 capsule (200 mg total) by mouth 3 (three) times daily as needed for cough. Swallow a whole, do not chew Ivy Lynn, NP  Active   budesonide-formoterol [facility]) 160-4.5 MCG/ACT inhaler [id number] No Inhale 2 puffs into the lungs 2 (two) times daily. [name], FNP Taking Active Self           Med Note Blanca Friend, Royce Macadamia   Tue Sep 28, 2021  1:26 PM) Via [place] patient assistance program   calcium acetate (PHOSLO) 667 MG tablet [id number] No Take by mouth. [provider] Taking Active   Camphor-Menthol-Methyl Sal (SALONPAS) 3.03-12-08 % PTCH [id number] No Apply 1 patch topically daily as needed (pain). [provider] Taking Active Self  Cholecalciferol 25 MCG (1000 UT) capsule [id number] No Take 1,000 Units by mouth daily. [provider] Taking Active Self  docusate sodium (COLACE) 100 MG capsule [id number] No Take 1 capsule (100 mg total) by mouth 2 (two) times daily. Debbrah Alar, PA-C Taking Active   doxepin (SINEQUAN) 10 MG capsule [id number] No Take 1 capsule (10 mg total) by mouth at bedtime. [name], FNP Taking Active Self  Dulaglutide (TRULICITY) 4.5 AT/5.5DD SOPN [id number] No Inject 4.5 mg as directed once a week.  Patient taking differently: Inject 4.5 mg as directed every Sunday.   [name], FNP Taking Active Self           Med Note Blanca Friend, Zakary Kimura D   Wed Jun 09, 2021 10:30 AM) Via [employer] [place] [employer] patient assistance program    empagliflozin (JARDIANCE) 25 MG TABS tablet [id number] No Take 1 tablet (25 mg total) by mouth daily before breakfast. [name], FNP Taking Active Self  Finerenone (KERENDIA) 10 MG TABS [id number] No Take 10 mg by mouth daily. [name], FNP Taking Active Self           Med Note Blanca Friend, Royce Macadamia    Tue Sep 28, 2021  1:27 PM) VIA [facility] PATIENT ASSISTANCE PROGRAM  guaiFENesin (MUCINEX) 600 MG 12 hr tablet [id number]  Take 1 tablet (600 mg total) by mouth 2 (two) times daily. Ivy Lynn, NP  Active   hydrochlorothiazide (HYDRODIURIL) 25 MG tablet [id number]  Take 1 tablet (25 mg total) by mouth daily. Ivy Lynn, NP  Active   HYDROcodone-acetaminophen (NORCO) 5-325 MG tablet [id number] No Take 1-2 tablets by mouth every 6 (six) hours as needed for moderate pain or severe pain. Debbrah Alar, PA-C Taking Active   Insulin Glargine (BASAGLAR KWIKPEN) 100 UNIT/ML [id number] No Inject 35 Units into the skin at bedtime. [provider] Taking Active Self           Med Note Parthenia Ames Dec 31, 2020 12:05 PM) Via lilly cares patient assistance program  insulin lispro (HUMALOG KWIKPEN) 100 UNIT/ML KwikPen [id number] No Inject 20-30 Units into the skin with breakfast, with lunch, and with evening meal.  Patient taking differently: Inject 10-15 Units into the skin See admin instructions. 10 units twice daily, 15 units at bedtime   [name] F, FNP Taking Active Self           Med Note Laurin Coder, ALEXA   Wed Aug 04, 2021  6:30 AM) Took 5 units this morning at 0415  Insulin Pen Needle (RELION PEN NEEDLES) 31G X 6 MM MISC [id number]  USE WITH INSULIN DAILY Ivy Lynn, NP  Active   lisinopril (ZESTRIL) 10 MG tablet [id number]  Take 1 tablet (10 mg total) by mouth daily. Ivy Lynn, NP  Active   lovastatin (MEVACOR) 40 MG tablet [id number] No Take 1.5 tablets (60 mg total) by mouth at bedtime. [name], FNP Taking Active Self  Menthol, Topical Analgesic, (BIOFREEZE) 4 % GEL [id number] No Apply 1 application. topically daily as needed (pain). [provider] Taking Active Self  methocarbamol (ROBAXIN) 500 MG tablet [id number] No Take 1 tablet (500 mg total) by mouth every 8 (eight) hours as needed for muscle spasms. [name], FNP Taking Active Self   predniSONE (DELTASONE) 20 MG tablet [id number]  Take 1 tablet (20 mg total) by mouth daily with breakfast. Ivy Lynn, NP  Active   sodium bicarbonate 650 MG tablet [id number] No Take by mouth. [provider] Taking Active   triamcinolone cream (KENALOG) 0.1 % [id number]  Apply 1 Application topically 2 (two) times daily. Ivy Lynn, NP  Active   Med List Note Lavera Guise [facility] 51/70/01 [id number]): trulicity, basaglar, humalog escribe to [facility] specialty mail order symbicort-escribe to [facility] mail order jardiance-escribe to [facility] mail order               Goals Addressed               This Visit's Progress     Patient Stated     T2DM PharmD goal (pt-stated)        Current Barriers:  Unable to independently afford treatment regimen Unable to achieve control of T2DM, CKD   Pharmacist Clinical Goal(s):  Over the next 90 days, patient will verbalize ability to afford treatment regimen maintain control of T2DM as evidenced by GOAL A1C<7%  through collaboration with PharmD and provider.    Interventions: 1:1 collaboration with [name], FNP regarding development and update of comprehensive plan of care as evidenced by provider attestation and co-signature Inter-disciplinary care team collaboration (see longitudinal plan of care) Comprehensive medication review performed; medication list updated in electronic medical record  Diabetes: CONTROLLED A1c 10.9%--> 7.1%;  much improved over the past year **Important ** -holding lisinopril, jardiance, kerendia per nephro/GFR 15--may need HD in the near future Current treatment: BASAGLAR 49-67 UNITS, TRULICITY 4.'5MG'$ ,; Humalog added for meal times (5-10 units with meals) PAST meds: Metformin was discontinued due to GFR 28-->15 Continues to work on controlling her diabetes Denies personal and family history of Medullary thyroid cancer (MTC) Current glucose readings: fasting glucose: <130, 113 today, post  prandial glucose: <200 Patient can't afford Libre CGM--$75 based on 20% copay [employer] DME Denies hypoglycemic/hyperglycemic symptoms Discussed meal planning options and Plate method for healthy eating Avoid sugary drinks and desserts Incorporate balanced protein, non starchy veggies, 1 serving of carbohydrate with each meal Increase water intake Increase physical activity as able Current exercise: N/A Educated on DIABETES  MEDICATIONS, BG READINGS; libre Assessed patient finances.  WILL RE-ENROLL:  lilly cares patient assistance for basaglar/trulicity/humalog BI cares (jardiance) [employer])  Patient Goals/Self-Care Activities Over the next 90 days, patient will:  - take medications as prescribed check glucose DAILY, document, and provide at future appointments  Follow Up Plan: Telephone follow up appointment with care management team member scheduled for: 3 months         Plan: Telephone follow up appointment with care management team member scheduled for:  3 months      Regina Eck, PharmD, BCPS Clinical Pharmacist, [place]  II Phone [phone]

## 2022-01-19 NOTE — Telephone Encounter (Signed)
Mailed all re-enrollments to patients home.

## 2022-01-19 NOTE — Patient Instructions (Signed)
Visit Information  Following are the goals we discussed today:  Current Barriers:  Unable to independently afford treatment regimen Unable to achieve control of T2DM, CKD   Pharmacist Clinical Goal(s):  Over the next 90 days, patient will verbalize ability to afford treatment regimen maintain control of T2DM as evidenced by GOAL A1C<7%  through collaboration with PharmD and provider.    Interventions: 1:1 collaboration with [name], FNP regarding development and update of comprehensive plan of care as evidenced by provider attestation and co-signature Inter-disciplinary care team collaboration (see longitudinal plan of care) Comprehensive medication review performed; medication list updated in electronic medical record  Diabetes: CONTROLLED A1c 10.9%--> 7.1%;  much improved over the past year **Important ** -holding lisinopril, jardiance, kerendia per nephro/GFR 15--may need HD in the near future Current treatment: BASAGLAR 44-03 UNITS, TRULICITY 4.'5MG'$ ,; Humalog added for meal times (5-10 units with meals) PAST meds: Metformin was discontinued due to GFR 28-->15 Continues to work on controlling her diabetes Denies personal and family history of Medullary thyroid cancer (MTC) Current glucose readings: fasting glucose: <130, 113 today, post prandial glucose: <200 Patient can't afford Libre CGM--$75 based on 20% copay [employer] Denies hypoglycemic/hyperglycemic symptoms Discussed meal planning options and Plate method for healthy eating Avoid sugary drinks and desserts Incorporate balanced protein, non starchy veggies, 1 serving of carbohydrate with each meal Increase water intake Increase physical activity as able Current exercise: N/A Educated on DIABETES MEDICATIONS, BG READINGS; [place] patient finances.  WILL RE-ENROLL:  [facility] cares patient assistance for basaglar/trulicity/humalog BI cares (jardiance) BAYER [facility])  Patient Goals/Self-Care  Activities Over the next 90 days, patient will:  - take medications as prescribed check glucose DAILY, document, and provide at future appointments  Follow Up Plan: Telephone follow up appointment with care management team member scheduled for: 3 months   Plan: Telephone follow up appointment with care management team member scheduled for:  3-6 months  Signature Regina Eck, PharmD, BCPS Clinical Pharmacist, [place]  II Phone [phone]   Please call the care guide team at [phone] if you need to cancel or reschedule your appointment.   Patient verbalizes understanding of instructions and care plan provided today and agrees to view in [place]. Active MyChart status and patient understanding of how to access instructions and care plan via MyChart confirmed with patient.   '

## 2022-01-21 ENCOUNTER — Encounter: Payer: Self-pay | Admitting: Pharmacist

## 2022-01-21 ENCOUNTER — Telehealth: Payer: Self-pay | Admitting: *Deleted

## 2022-01-21 NOTE — Telephone Encounter (Signed)
   CCM RN Visit Note   01/21/22 Name: Brooke [name] MRN: [id number]      DOB: Jun 04, 1955  Subjective: Brooke [name] is a 66 y.o. year old female who is a primary care patient of '@[email or web address]'$ . The patient was referred to the Chronic Care Management team for assistance with care management needs subsequent to provider initiation of CCM services and plan of care.      An unsuccessful telephone outreach was attempted today to contact the patient about Chronic Care Management needs.    Plan:Telephone follow up appointment with care management team member scheduled for:  will attempt to outreach patient within the next month.  Jacqlyn Larsen RNC, BSN RN Case Manager [place] [phone]

## 2022-01-26 ENCOUNTER — Encounter: Payer: Self-pay | Admitting: Nurse Practitioner

## 2022-01-26 ENCOUNTER — Ambulatory Visit (INDEPENDENT_AMBULATORY_CARE_PROVIDER_SITE_OTHER): Payer: Medicare HMO | Admitting: Nurse Practitioner

## 2022-01-26 VITALS — BP 151/71 | HR 75 | Temp 97.9°F | Ht 66.0 in | Wt 211.0 lb

## 2022-01-26 DIAGNOSIS — G8929 Other chronic pain: Secondary | ICD-10-CM | POA: Insufficient documentation

## 2022-01-26 DIAGNOSIS — M25561 Pain in right knee: Secondary | ICD-10-CM | POA: Diagnosis not present

## 2022-01-26 MED ORDER — METHYLPREDNISOLONE ACETATE 40 MG/ML IJ SUSP
40.0000 mg | Freq: Once | INTRAMUSCULAR | Status: AC
  Administered 2022-01-26: 40 mg via INTRAMUSCULAR

## 2022-01-26 NOTE — Patient Instructions (Signed)
Acute Knee Pain, Adult Acute knee pain is sudden and may be caused by damage, swelling, or irritation of the muscles and tissues that support the knee. Pain may result from: A fall. An injury to the knee from twisting motions. A hit to the knee. Infection. Acute knee pain may go away on its own with time and rest. If it does not, your health care provider may order tests to find the cause of the pain. These may include: Imaging tests, such as an X-ray, MRI, CT scan, or ultrasound. Joint aspiration. In this test, fluid is removed from the knee and evaluated. Arthroscopy. In this test, a lighted tube is inserted into the knee and an image is projected onto a TV screen. Biopsy. In this test, a sample of tissue is removed from the body and studied under a microscope. Follow these instructions at home: If you have a knee sleeve or brace:  Wear the knee sleeve or brace as told by your health care provider. Remove it only as told by your health care provider. Loosen it if your toes tingle, become numb, or turn cold and blue. Keep it clean. If the knee sleeve or brace is not waterproof: Do not let it get wet. Cover it with a watertight covering when you take a bath or shower. Activity Rest your knee. Do not do things that cause pain or make pain worse. Avoid high-impact activities or exercises, such as running, jumping rope, or doing jumping jacks. Work with a physical therapist to make a safe exercise program, as recommended by your health care provider. Do exercises as told by your physical therapist. Managing pain, stiffness, and swelling  If directed, put ice on the affected knee. To do this: If you have a removable knee sleeve or brace, remove it as told by your health care provider. Put ice in a plastic bag. Place a towel between your skin and the bag. Leave the ice on for 20 minutes, 2-3 times a day. Remove the ice if your skin turns bright red. This is very important. If you cannot  feel pain, heat, or cold, you have a greater risk of damage to the area. If directed, use an elastic bandage to put pressure (compression) on your injured knee. This may control swelling, give support, and help with discomfort. Raise (elevate) your knee above the level of your heart while you are sitting or lying down. Sleep with a pillow under your knee. General instructions Take over-the-counter and prescription medicines only as told by your health care provider. Do not use any products that contain nicotine or tobacco, such as cigarettes, e-cigarettes, and chewing tobacco. If you need help quitting, ask your health care provider. If you are overweight, work with your health care provider and a dietitian to set a weight-loss goal that is healthy and reasonable for you. Extra weight can put pressure on your knee. Pay attention to any changes in your symptoms. Keep all follow-up visits. This is important. Contact a health care provider if: Your knee pain continues, changes, or gets worse. You have a fever along with knee pain. Your knee feels warm to the touch or is red. Your knee buckles or locks up. Get help right away if: Your knee swells, and the swelling becomes worse. You cannot move your knee. You have severe pain in your knee that cannot be managed with pain medicine. Summary Acute knee pain can be caused by a fall, an injury, an infection, or damage, swelling, or irritation   of the tissues that support your knee. Your health care provider may perform tests to find out the cause of the pain. Pay attention to any changes in your symptoms. Relieve your pain with rest, medicines, light activity, and the use of ice. Get help right away if your knee swells, you cannot move your knee, or you have severe pain that cannot be managed with medicine. This information is not intended to replace advice given to you by your health care provider. Make sure you discuss any questions you have with  your health care provider. Document Revised: 08/07/2019 Document Reviewed: 08/07/2019 Elsevier Patient Education  2023 Else[employer] Inc.  

## 2022-01-26 NOTE — Assessment & Plan Note (Signed)
Pain symptoms not well-controlled in the right knee.  Completed knee injection patient tolerated procedure well.  Education provided to patient on the signs and symptoms of infection.  Educational handout given to patient.  Follow-up with worsening unresolved symptoms.

## 2022-01-26 NOTE — Progress Notes (Signed)
Acute Office Visit  Subjective:     Patient ID: Brooke [name], female    DOB: March 12, 1955, 66 y.o.   MRN: [id number]  Chief Complaint  Patient presents with   Knee Pain    Right knee is the worst     Knee Pain  The incident occurred more than 1 week ago (More than a month ago). The incident occurred at home. There was no injury mechanism. The quality of the pain is described as aching. The pain is at a severity of 8/10. The pain is severe. The pain has been Constant since onset. She reports no foreign bodies present. The symptoms are aggravated by movement and palpation. She has tried NSAIDs, acetaminophen, elevation and ice for the symptoms. The treatment provided no relief.     Review of Systems  Constitutional: Negative.   HENT: Negative.    Respiratory: Negative.    Cardiovascular: Negative.   Musculoskeletal:  Positive for joint pain.  Skin: Negative.    Joint Injection/Arthrocentesis  Date/Time: 01/26/2022 4:55 PM  Performed by: Ivy Lynn, NP Authorized by: Ivy Lynn, NP  Indications: pain  Body area: knee Local anesthesia used: yes  Anesthesia: Local anesthesia used: yes Local Anesthetic: lidocaine spray  Sedation: Patient sedated: no  Needle size: 22 G Ultrasound guidance: no Approach: anterior Methylprednisolone amount: 40 mg Lidocaine 1% amount: 2 mL Patient tolerance: patient tolerated the procedure well with no immediate complications         Objective:    BP (!) 151/71   Pulse 75   Temp 97.9 F (36.6 C)   Ht '5\' 6"'$  (1.676 m)   Wt 211 lb (95.7 kg)   LMP 03/16/2012   SpO2 96%   BMI 34.06 kg/m  BP Readings from Last 3 Encounters:  01/26/22 (!) 151/71  01/21/22 130/75  12/27/21 (!) 152/74   Wt Readings from Last 3 Encounters:  01/26/22 211 lb (95.7 kg)  12/27/21 206 lb (93.4 kg)  10/16/21 209 lb (94.8 kg)      Physical Exam Vitals and nursing note reviewed.  Constitutional:      Appearance: Normal appearance.   HENT:     Head: Normocephalic.     Right Ear: External ear normal.     Left Ear: External ear normal.     Nose: Nose normal.  Eyes:     Conjunctiva/sclera: Conjunctivae normal.  Cardiovascular:     Rate and Rhythm: Normal rate and regular rhythm.     Pulses: Normal pulses.     Heart sounds: Normal heart sounds.  Pulmonary:     Effort: Pulmonary effort is normal.     Breath sounds: Normal breath sounds.  Abdominal:     General: Bowel sounds are normal.  Musculoskeletal:     Right knee: Decreased range of motion. Tenderness present. No MCL, LCL, ACL or PCL tenderness.       Legs:     Comments: Chronic tenderness and pain right knee  Skin:    General: Skin is warm.     Findings: No erythema or rash.  Neurological:     General: No focal deficit present.     Mental Status: She is alert and oriented to person, place, and time.     No results found for any visits on 01/26/22.      Assessment & Plan:   Problem List Items Addressed This Visit       Other   Chronic pain of right knee - Primary  Pain symptoms not well-controlled in the right knee.  Completed knee injection patient tolerated procedure well.  Education provided to patient on the signs and symptoms of infection.  Educational handout given to patient.  Follow-up with worsening unresolved symptoms.      Relevant Orders   Joint Injection/Arthrocentesis    Meds ordered this encounter  Medications   methylPREDNISolone acetate (DEPO-MEDROL) injection 40 mg    Return if symptoms worsen or fail to improve.  Ivy Lynn, NP

## 2022-01-31 ENCOUNTER — Telehealth: Payer: Self-pay

## 2022-01-31 NOTE — Progress Notes (Signed)
  Chronic Care Management   Note  01/31/2022 Name: Brooke [name] MRN: [id number] DOB: Apr 15, 1955  Brooke [name] is a 66 y.o. year old female who is a primary care patient of Ivy Lynn, NP. I reached out to [name] by phone today in response to a referral sent by Brooke [name].  Brooke [name]  agreedto scheduling an appointment with the CCM RN Case Manager   Follow up plan: Patient agreed to scheduled appointment with RN Case Manager on 02/10/2022(date/time).   Brooke [name], [place], [place] [address] Direct Dial: [phone] [email or web address]

## 2022-02-03 DIAGNOSIS — N183 Chronic kidney disease, stage 3 unspecified: Secondary | ICD-10-CM

## 2022-02-03 DIAGNOSIS — Z794 Long term (current) use of insulin: Secondary | ICD-10-CM

## 2022-02-03 DIAGNOSIS — E1122 Type 2 diabetes mellitus with diabetic chronic kidney disease: Secondary | ICD-10-CM

## 2022-02-03 DIAGNOSIS — E1165 Type 2 diabetes mellitus with hyperglycemia: Secondary | ICD-10-CM

## 2022-02-10 ENCOUNTER — Ambulatory Visit (INDEPENDENT_AMBULATORY_CARE_PROVIDER_SITE_OTHER): Payer: Medicare HMO | Admitting: *Deleted

## 2022-02-10 DIAGNOSIS — I152 Hypertension secondary to endocrine disorders: Secondary | ICD-10-CM

## 2022-02-10 NOTE — Chronic Care Management (AMB) (Signed)
Chronic Care Management   CCM RN Visit Note  02/10/2022 Name: Brooke [name] MRN: [id number] DOB: 29-Jun-1955  Subjective: Brooke [name] is a 66 y.o. year old female who is a primary care patient of Ivy Lynn, NP. The patient was referred to the Chronic Care Management team for assistance with care management needs subsequent to provider initiation of CCM services and plan of care.    Today's Visit:  Engaged with patient by telephone for initial visit.     SDOH Interventions Today    Flowsheet Row Most Recent Value  SDOH Interventions   Food Insecurity Interventions Other (Comment)  [care guide referral]  Housing Interventions Intervention Not Indicated  Transportation Interventions Intervention Not Indicated  Utilities Interventions Intervention Not Indicated  Financial Strain Interventions Other (Comment)  [careguide referral]  Physical Activity Interventions Patient Refused  Stress Interventions Intervention Not Indicated  Social Connections Interventions Intervention Not Indicated         Goals Addressed             This Visit's Progress    CCM (CHRONIC KIDNEY DISEASE) EXPECTED OUTCOME: MONITOR, SELF-MANAGE AND REDUCE SYMPTOMS OF CHRONIC KIDNEY DISEASE       Current Barriers:  Knowledge Deficits related to CKD Chronic Disease Management support and education needs related to CKD No Advanced Directives in place- Pt declines Pt reports she is concerned about her kidneys and trying to keep blood sugar, blood pressure WNL to preserve kidney function, does not drink sugary drinks  Planned Interventions: Assessed the patient     understanding of chronic kidney disease    Reviewed prescribed diet low sodium, being mindful of phosphorus intake Reviewed medications with patient and discussed importance of compliance    Counseled on the importance of exercise goals with target of 150 minutes per week     Advised patient, providing education and rationale, to  monitor blood pressure daily and record, calling PCP for findings outside established parameters    Discussed complications of poorly controlled blood pressure such as heart disease, stroke, circulatory complications, vision complications, kidney impairment, sexual dysfunction    Discussed plans with patient for ongoing care management follow up and provided patient with direct contact information for care management team    Screening for signs and symptoms of depression related to chronic disease state      Assessed social determinant of health barriers     Symptom Management: Take medications as prescribed   Attend all scheduled provider appointments Call pharmacy for medication refills 3-7 days in advance of running out of medications Attend church or other social activities Perform all self care activities independently  Perform IADL's (shopping, preparing meals, housekeeping, managing finances) independently Call provider office for new concerns or questions   Follow Up Plan: Telephone follow up appointment with care management team member scheduled for:  04/07/22 at 130 pm       CCM (DIABETES) EXPECTED OUTCOME:  MONITOR, SELF-MANAGE AND REDUCE SYMPTOMS OF DIABETES       Current Barriers:  Knowledge Deficits related to Diabetes management Chronic Disease Management support and education needs related to Diabetes Financial Constraints.  No Advanced Directives in place- pt declines Pt reports she checks CBG BID with fasting ranges 99-130, random ranges 170-180 Pt reports she tried to eat healthy and be mindful of carbohydrate intake Pt reports she does not always have enough money to buy food, does not qualify for food stamps, does utilize [address] for hot meals Wed- Sat and groceries, fresh produce  given  Planned Interventions: Provided education to patient about basic DM disease process; Reviewed medications with patient and discussed importance of medication adherence;         Reviewed prescribed diet with patient carbohydrate modified; Counseled on importance of regular laboratory monitoring as prescribed;        Discussed plans with patient for ongoing care management follow up and provided patient with direct contact information for care management team;      Provided patient with written educational materials related to hypo and hyperglycemia and importance of correct treatment;       Referral made to community resources care guide team for assistance with food insecurity;      Review of patient status, including review of consultants reports, relevant laboratory and other test results, and medications completed;       Screening for signs and symptoms of depression related to chronic disease state;        Assessed social determinant of health barriers;         Symptom Management: Take medications as prescribed   Attend all scheduled provider appointments Call pharmacy for medication refills 3-7 days in advance of running out of medications Attend church or other social activities Perform all self care activities independently  Perform IADL's (shopping, preparing meals, housekeeping, managing finances) independently Call provider office for new concerns or questions  check blood sugar at prescribed times: twice daily check feet daily for cuts, sores or redness enter blood sugar readings and medication or insulin into daily log take the blood sugar log to all doctor visits take the blood sugar meter to all doctor visits trim toenails straight across fill half of plate with vegetables limit fast food meals to no more than 1 per week manage portion size read food labels for fat, fiber, carbohydrates and portion size keep feet up while sitting wash and dry feet carefully every day Look over education via my chart- hypoglycemia Expect a call from care guide for food resources  Follow Up Plan: Telephone follow up appointment with care management team member  scheduled for: 04/07/22 at 130 pm       CCM (HYPERTENSION) EXPECTED OUTCOME: MONITOR, SELF-MANAGE AND REDUCE SYMPTOMS OF HYPERTENSION       Current Barriers:  Knowledge Deficits related to Hypertension management Chronic Disease Management support and education needs related to Hypertension No Advanced Directives in place- pt declines Pt reports she lives with spouse, is independent in all aspects of her care, does not exercise, tries to adhere to healthy diet Pt reports she checks blood pressure on occasion  Planned Interventions: Evaluation of current treatment plan related to hypertension self management and patient's adherence to plan as established by provider;   Reviewed prescribed diet low sodium Reviewed medications with patient and discussed importance of compliance;  Counseled on the importance of exercise goals with target of 150 minutes per week Discussed plans with patient for ongoing care management follow up and provided patient with direct contact information for care management team; Advised patient, providing education and rationale, to monitor blood pressure daily and record, calling PCP for findings outside established parameters;  Discussed complications of poorly controlled blood pressure such as heart disease, stroke, circulatory complications, vision complications, kidney impairment, sexual dysfunction;  Screening for signs and symptoms of depression related to chronic disease state;  Assessed social determinant of health barriers;  Reviewed upcoming scheduled appointments - pharmacist on 03/11/22  Symptom Management: Take medications as prescribed   Attend all scheduled provider appointments  Call pharmacy for medication refills 3-7 days in advance of running out of medications Attend church or other social activities Perform all self care activities independently  Perform IADL's (shopping, preparing meals, housekeeping, managing finances) independently Call provider  office for new concerns or questions  check blood pressure weekly choose a place to take my blood pressure (home, clinic or office, retail store) write blood pressure results in a log or diary learn about high blood pressure take blood pressure log to all doctor appointments keep all doctor appointments take medications for blood pressure exactly as prescribed begin an exercise program eat more whole grains, fruits and vegetables, lean meats and healthy fats Look over education sent via my chart- low sodium diet  Follow Up Plan: Telephone follow up appointment with care management team member scheduled for: 04/07/22 at 130 pm          Plan:Telephone follow up appointment with care management team member scheduled for:  04/07/22 at 130 pm  Jacqlyn Larsen [facility], BSN RN Case Manager [place] [phone]

## 2022-02-10 NOTE — Patient Instructions (Signed)
Please call the care guide team at [phone] if you need to cancel or reschedule your appointment.   If you are experiencing a Mental Health or [place] or need someone to talk to, please call the Suicide and Crisis Lifeline: 988 call the [place] National Suicide Prevention Lifeline: 6028805459 or TTY: 979-741-9223 TTY (814)462-9701) to talk to a trained counselor call 1-800-273-TALK (toll free, 24 hour hotline) go to [facility] [address] [place] [phone]) call the [place]: [phone] call 911   Following is a copy of your full provider care plan:   Goals Addressed             This Visit's Progress    CCM ([place]) EXPECTED OUTCOME: MONITOR, SELF-MANAGE AND REDUCE SYMPTOMS OF CHRONIC KIDNEY DISEASE       Current Barriers:  Knowledge Deficits related to CKD Chronic Disease Management support and education needs related to CKD No Advanced Directives in place- Pt declines Pt reports she is concerned about her kidneys and trying to keep blood sugar, blood pressure WNL to preserve kidney function, does not drink sugary drinks  Planned Interventions: Assessed the patient     understanding of chronic kidney disease    Reviewed prescribed diet low sodium, being mindful of phosphorus intake Reviewed medications with patient and discussed importance of compliance    Counseled on the importance of exercise goals with target of 150 minutes per week     Advised patient, providing education and rationale, to monitor blood pressure daily and record, calling PCP for findings outside established parameters    Discussed complications of poorly controlled blood pressure such as heart disease, stroke, circulatory complications, vision complications, kidney impairment, sexual dysfunction    Discussed plans with patient for ongoing care management follow up and provided patient with direct contact  information for care management team    Screening for signs and symptoms of depression related to chronic disease state      Assessed social determinant of health barriers     Symptom Management: Take medications as prescribed   Attend all scheduled provider appointments Call pharmacy for medication refills 3-7 days in advance of running out of medications Attend church or other social activities Perform all self care activities independently  Perform IADL's (shopping, preparing meals, housekeeping, managing finances) independently Call provider office for new concerns or questions   Follow Up Plan: Telephone follow up appointment with care management team member scheduled for:  04/07/22 at 130 pm       CCM (DIABETES) EXPECTED OUTCOME:  MONITOR, SELF-MANAGE AND REDUCE SYMPTOMS OF DIABETES       Current Barriers:  Knowledge Deficits related to Diabetes management Chronic Disease Management support and education needs related to Diabetes Financial Constraints.  No Advanced Directives in place- pt declines Pt reports she checks CBG BID with fasting ranges 99-130, random ranges 170-180 Pt reports she tried to eat healthy and be mindful of carbohydrate intake Pt reports she does not always have enough money to buy food, does not qualify for food stamps, does utilize [address] for hot meals Wed- Sat and groceries, fresh produce given  Planned Interventions: Provided education to patient about basic DM disease process; Reviewed medications with patient and discussed importance of medication adherence;        Reviewed prescribed diet with patient carbohydrate modified; Counseled on importance of regular laboratory monitoring as prescribed;        Discussed plans with patient for  ongoing care management follow up and provided patient with direct contact information for care management team;      Provided patient with written educational materials related to hypo and hyperglycemia and importance  of correct treatment;       Referral made to community resources care guide team for assistance with food insecurity;      Review of patient status, including review of consultants reports, relevant laboratory and other test results, and medications completed;       Screening for signs and symptoms of depression related to chronic disease state;        Assessed social determinant of health barriers;         Symptom Management: Take medications as prescribed   Attend all scheduled provider appointments Call pharmacy for medication refills 3-7 days in advance of running out of medications Attend church or other social activities Perform all self care activities independently  Perform IADL's (shopping, preparing meals, housekeeping, managing finances) independently Call provider office for new concerns or questions  check blood sugar at prescribed times: twice daily check feet daily for cuts, sores or redness enter blood sugar readings and medication or insulin into daily log take the blood sugar log to all doctor visits take the blood sugar meter to all doctor visits trim toenails straight across fill half of plate with vegetables limit fast food meals to no more than 1 per week manage portion size read food labels for fat, fiber, carbohydrates and portion size keep feet up while sitting wash and dry feet carefully every day Look over education via my chart- hypoglycemia Expect a call from care guide for food resources  Follow Up Plan: Telephone follow up appointment with care management team member scheduled for: 04/07/22 at 130 pm       CCM (HYPERTENSION) EXPECTED OUTCOME: MONITOR, SELF-MANAGE AND REDUCE SYMPTOMS OF HYPERTENSION       Current Barriers:  Knowledge Deficits related to Hypertension management Chronic Disease Management support and education needs related to Hypertension No Advanced Directives in place- pt declines Pt reports she lives with spouse, is independent in  all aspects of her care, does not exercise, tries to adhere to healthy diet Pt reports she checks blood pressure on occasion  Planned Interventions: Evaluation of current treatment plan related to hypertension self management and patient's adherence to plan as established by provider;   Reviewed prescribed diet low sodium Reviewed medications with patient and discussed importance of compliance;  Counseled on the importance of exercise goals with target of 150 minutes per week Discussed plans with patient for ongoing care management follow up and provided patient with direct contact information for care management team; Advised patient, providing education and rationale, to monitor blood pressure daily and record, calling PCP for findings outside established parameters;  Discussed complications of poorly controlled blood pressure such as heart disease, stroke, circulatory complications, vision complications, kidney impairment, sexual dysfunction;  Screening for signs and symptoms of depression related to chronic disease state;  Assessed social determinant of health barriers;  Reviewed upcoming scheduled appointments - pharmacist on 03/11/22  Symptom Management: Take medications as prescribed   Attend all scheduled provider appointments Call pharmacy for medication refills 3-7 days in advance of running out of medications Attend church or other social activities Perform all self care activities independently  Perform IADL's (shopping, preparing meals, housekeeping, managing finances) independently Call provider office for new concerns or questions  check blood pressure weekly choose a place to take my blood pressure (home,  clinic or office, retail store) write blood pressure results in a log or diary learn about high blood pressure take blood pressure log to all doctor appointments keep all doctor appointments take medications for blood pressure exactly as prescribed begin an exercise  program eat more whole grains, fruits and vegetables, lean meats and healthy fats Look over education sent via my chart- low sodium diet  Follow Up Plan: Telephone follow up appointment with care management team member scheduled for: 04/07/22 at 130 pm          Patient verbalizes understanding of instructions and care plan provided today and agrees to view in [place]. Active MyChart status and patient understanding of how to access instructions and care plan via MyChart confirmed with patient.     Telephone follow up appointment with care management team member scheduled for:  04/07/22 at 130 pm  Low-Sodium Eating Plan Sodium, which is an element that makes up salt, helps you maintain a healthy balance of fluids in your body. Too much sodium can increase your blood pressure and cause fluid and waste to be held in your body. Your health care provider or dietitian may recommend following this plan if you have high blood pressure (hypertension), kidney disease, liver disease, or heart failure. Eating less sodium can help lower your blood pressure, reduce swelling, and protect your heart, liver, and kidneys. What are tips for following this plan? Reading food labels The Nutrition Facts label lists the amount of sodium in one serving of the food. If you eat more than one serving, you must multiply the listed amount of sodium by the number of servings. Choose foods with less than 140 mg of sodium per serving. Avoid foods with 300 mg of sodium or more per serving. Shopping  Look for lower-sodium products, often labeled as "low-sodium" or "no salt added." Always check the sodium content, even if foods are labeled as "unsalted" or "no salt added." Buy fresh foods. Avoid canned foods and pre-made or frozen meals. Avoid canned, cured, or processed meats. Buy breads that have less than 80 mg of sodium per slice. Cooking  Eat more home-cooked food and less restaurant, buffet, and fast food. Avoid  adding salt when cooking. Use salt-free seasonings or herbs instead of table salt or sea salt. Check with your health care provider or pharmacist before using salt substitutes. Cook with plant-based oils, such as canola, sunflower, or olive oil. Meal planning When eating at a restaurant, ask that your food be prepared with less salt or no salt, if possible. Avoid dishes labeled as brined, pickled, cured, smoked, or made with soy sauce, miso, or teriyaki sauce. Avoid foods that contain MSG (monosodium glutamate). MSG is sometimes added to [place] food, bouillon, and some canned foods. Make meals that can be grilled, baked, poached, roasted, or steamed. These are generally made with less sodium. General information Most people on this plan should limit their sodium intake to 1,500-2,000 mg (milligrams) of sodium each day. What foods should I eat? Fruits Fresh, frozen, or canned fruit. Fruit juice. Vegetables Fresh or frozen vegetables. "No salt added" canned vegetables. "No salt added" tomato sauce and paste. Low-sodium or reduced-sodium tomato and vegetable juice. Grains Low-sodium cereals, including oats, puffed wheat and rice, and shredded wheat. Low-sodium crackers. Unsalted rice. Unsalted pasta. Low-sodium bread. Whole-grain breads and whole-grain pasta. Meats and other proteins Fresh or frozen (no salt added) meat, poultry, seafood, and fish. Low-sodium canned tuna and salmon. Unsalted nuts. Dried peas, beans, and lentils without  added salt. Unsalted canned beans. Eggs. Unsalted nut butters. Dairy Milk. Soy milk. Cheese that is naturally low in sodium, such as ricotta cheese, fresh mozzarella, or Swiss cheese. Low-sodium or reduced-sodium cheese. Cream cheese. Yogurt. Seasonings and condiments Fresh and dried herbs and spices. Salt-free seasonings. Low-sodium mustard and ketchup. Sodium-free salad dressing. Sodium-free light mayonnaise. Fresh or refrigerated horseradish. Lemon juice.  Vinegar. Other foods Homemade, reduced-sodium, or low-sodium soups. Unsalted popcorn and pretzels. Low-salt or salt-free chips. The items listed above may not be a complete list of foods and beverages you can eat. Contact a dietitian for more information. What foods should I avoid? Vegetables Sauerkraut, pickled vegetables, and relishes. Olives. [place] fries. Onion rings. Regular canned vegetables (not low-sodium or reduced-sodium). Regular canned tomato sauce and paste (not low-sodium or reduced-sodium). Regular tomato and vegetable juice (not low-sodium or reduced-sodium). Frozen vegetables in sauces. Grains Instant hot cereals. Bread stuffing, pancake, and biscuit mixes. Croutons. Seasoned rice or pasta mixes. Noodle soup cups. Boxed or frozen macaroni and cheese. Regular salted crackers. Self-rising flour. Meats and other proteins Meat or fish that is salted, canned, smoked, spiced, or pickled. Precooked or cured meat, such as sausages or meat loaves. Bernie[place] Salines. Ham. Pepperoni. Hot dogs. Corned beef. Chipped beef. Salt pork. Jerky. Pickled herring. Anchovies and sardines. Regular canned tuna. Salted nuts. Dairy Processed cheese and cheese spreads. Hard cheeses. Cheese curds. Blue cheese. Feta cheese. String cheese. Regular cottage cheese. Buttermilk. Canned milk. Fats and oils Salted butter. Regular margarine. Ghee. Bacon fat. Seasonings and condiments Onion salt, garlic salt, seasoned salt, table salt, and sea salt. Canned and packaged gravies. Worcestershire sauce. Tartar sauce. Barbecue sauce. Teriyaki sauce. Soy sauce, including reduced-sodium. Steak sauce. Fish sauce. Oyster sauce. Cocktail sauce. Horseradish that you find on the shelf. Regular ketchup and mustard. Meat flavorings and tenderizers. Bouillon cubes. Hot sauce. Pre-made or packaged marinades. Pre-made or packaged taco seasonings. Relishes. Regular salad dressings. Salsa. Other foods Salted popcorn and pretzels. Corn chips and  puffs. Potato and tortilla chips. Canned or dried soups. Pizza. Frozen entrees and pot pies. The items listed above may not be a complete list of foods and beverages you should avoid. Contact a dietitian for more information. Summary Eating less sodium can help lower your blood pressure, reduce swelling, and protect your heart, liver, and kidneys. Most people on this plan should limit their sodium intake to 1,500-2,000 mg (milligrams) of sodium each day. Canned, boxed, and frozen foods are high in sodium. Restaurant foods, fast foods, and pizza are also very high in sodium. You also get sodium by adding salt to food. Try to cook at home, eat more fresh fruits and vegetables, and eat less fast food and canned, processed, or prepared foods. This information is not intended to replace advice given to you by your health care provider. Make sure you discuss any questions you have with your health care provider. Document Revised: 03/29/2019 Document Reviewed: 01/23/2019 Elsevier Patient Education  [place]. Hypoglycemia Hypoglycemia is when the sugar (glucose) level in your blood is too low. Low blood sugar can happen to people who have diabetes and people who do not have diabetes. Low blood sugar can happen quickly, and it can be an emergency. What are the causes? This condition happens most often in people who have diabetes. It may be caused by: Diabetes medicine. Not eating enough, or not eating often enough. Doing more physical activity. Drinking alcohol on an empty stomach. If you do not have diabetes, this condition may be  caused by: A tumor in the pancreas. Not eating enough, or not eating for long periods at a time (fasting). A very bad infection or illness. Problems after having weight loss (bariatric) surgery. Kidney failure or liver failure. Certain medicines. What increases the risk? This condition is more likely to develop in people who: Have diabetes and take medicines to  lower their blood sugar. Abuse alcohol. Have a very bad illness. What are the signs or symptoms? Mild Hunger. Sweating and feeling clammy. Feeling dizzy or light-headed. Being sleepy or having trouble sleeping. Feeling like you may vomit (nauseous). A fast heartbeat. A headache. Blurry vision. Mood changes, such as: Being grouchy. Feeling worried or nervous (anxious). Tingling or loss of feeling (numbness) around your mouth, lips, or tongue. Moderate Confusion and poor judgment. Behavior changes. Weakness. Uneven heartbeat. Trouble with moving (coordination). Very low Very low blood sugar (severe hypoglycemia) is a medical emergency. It can cause: Fainting. Seizures. Loss of consciousness (coma). Death. How is this treated? Treating low blood sugar Low blood sugar is often treated by eating or drinking something that has sugar in it right away. The food or drink should contain 15 grams of a fast-acting carb (carbohydrate). Options include: 4 oz (120 mL) of fruit juice. 4 oz (120 mL) of regular soda (not diet soda). A few pieces of hard candy. Check food labels to see how many pieces to eat for 15 grams. 1 Tbsp (15 mL) of sugar or honey. 4 glucose tablets. 1 tube of glucose gel. Treating low blood sugar if you have diabetes If you can think clearly and swallow safely, follow the 15:15 rule: Take 15 grams of a fast-acting carb. Talk with your doctor about how much you should take. Always keep a source of fast-acting carb with you, such as: Glucose tablets (take 4 tablets). A few pieces of hard candy. Check food labels to see how many pieces to eat for 15 grams. 4 oz (120 mL) of fruit juice. 4 oz (120 mL) of regular soda (not diet soda). 1 Tbsp (15 mL) of honey or sugar. 1 tube of glucose gel. Check your blood sugar 15 minutes after you take the carb. If your blood sugar is still at or below 70 mg/dL (3.9 mmol/L), take 15 grams of a carb again. If your blood sugar does  not go above 70 mg/dL (3.9 mmol/L) after 3 tries, get help right away. After your blood sugar goes back to normal, eat a meal or a snack within 1 hour.  Treating very low blood sugar If your blood sugar is below 54 mg/dL (3 mmol/L), you have very low blood sugar, or severe hypoglycemia. This is an emergency. Get medical help right away. If you have very low blood sugar and you cannot eat or drink, you will need to be given a hormone called glucagon. A family member or friend should learn how to check your blood sugar and how to give you glucagon. Ask your doctor if you need to have an emergency glucagon kit at home. Very low blood sugar may also need to be treated in a hospital. Follow these instructions at home: General instructions Take over-the-counter and prescription medicines only as told by your doctor. Stay aware of your blood sugar as told by your doctor. If you drink alcohol: Limit how much you have to: 0-1 drink a day for women who are not pregnant. 0-2 drinks a day for men. Know how much alcohol is in your drink. In the [place] one drink  equals one 12 oz bottle of beer (355 mL), one 5 oz glass of wine (148 mL), or one 1 oz glass of hard liquor (44 mL). Be sure to eat food when you drink alcohol. Know that your body absorbs alcohol quickly. This may lead to low blood sugar later. Be sure to keep checking your blood sugar. Keep all follow-up visits. If you have diabetes:  Always have a fast-acting carb (15 grams) with you to treat low blood sugar. Follow your diabetes care plan as told by your doctor. Make sure you: Know the symptoms of low blood sugar. Check your blood sugar as often as told. Always check it before and after exercise. Always check your blood sugar before you drive. Take your medicines as told. Follow your meal plan. Eat on time. Do not skip meals. Share your diabetes care plan with: Your work or school. People you live with. Carry a card or wear jewelry that  says you have diabetes. Where to find more information American Diabetes Association: www.diabetes.org Contact a doctor if: You have trouble keeping your blood sugar in your target range. You have low blood sugar often. Get help right away if: You still have symptoms after you eat or drink something that contains 15 grams of fast-acting carb, and you cannot get your blood sugar above 70 mg/dL by following the 15:15 rule. Your blood sugar is below 54 mg/dL (3 mmol/L). You have a seizure. You faint. These symptoms may be an emergency. Get help right away. Call your local emergency services (911 in the [place] Do not wait to see if the symptoms will go away. Do not drive yourself to the hospital. Summary Hypoglycemia happens when the level of sugar (glucose) in your blood is too low. Low blood sugar can happen to people who have diabetes and people who do not have diabetes. Low blood sugar can happen quickly, and it can be an emergency. Make sure you know the symptoms of low blood sugar and know how to treat it. Always keep a source of sugar (fast-acting carb) with you to treat low blood sugar. This information is not intended to replace advice given to you by your health care provider. Make sure you discuss any questions you have with your health care provider. Document Revised: 01/23/2020 Document Reviewed: 01/23/2020 Elsevier Patient Education  [place].

## 2022-02-10 NOTE — Plan of Care (Signed)
Chronic Care Management Provider Comprehensive Care Plan    02/10/2022 Name: Brooke [name] MRN: [id number] DOB: 06/15/1955  Referral to Chronic Care Management (CCM) services was placed by Provider:  Jac Canavan NP on Date: 01/18/22.  Chronic Condition 1: HYPERTENSION Provider Assessment and Plan  Hypertension associated with diabetes ([place])     Relevant Medications    amLODipine (NORVASC) 10 MG tablet    atenolol (TENORMIN) 100 MG tablet    hydrochlorothiazide (HYDRODIURIL) 25 MG tablet    lisinopril (ZESTRIL) 10 MG tablet     Expected Outcome/Goals Addressed This Visit (Provider CCM goals/Provider Assessment and plan  CCM (HYPERTENSION) EXPECTED OUTCOME: MONITOR, SELF-MANAGE AND REDUCE SYMPTOMS OF HYPERTENSION  Symptom Management Condition 1: Take medications as prescribed   Attend all scheduled provider appointments Call pharmacy for medication refills 3-7 days in advance of running out of medications Attend church or other social activities Perform all self care activities independently  Perform IADL's (shopping, preparing meals, housekeeping, managing finances) independently Call provider office for new concerns or questions  check blood pressure weekly choose a place to take my blood pressure (home, clinic or office, retail store) write blood pressure results in a log or diary learn about high blood pressure take blood pressure log to all doctor appointments keep all doctor appointments take medications for blood pressure exactly as prescribed begin an exercise program eat more whole grains, fruits and vegetables, lean meats and healthy fats Look over education sent via my chart- low sodium diet  Chronic Condition 2: DIABETES Provider Assessment and Plan  Type 2 diabetes mellitus with hyperglycemia, with long-term current use of insulin ([place]) - Primary       Diabetes well controlled on current regimen no changes necessary.  Completed labs results pending.   Follow-up in 3 months.  Encourage patient to continue diabetic diet, exercise and weight loss.        Relevant Medications    lisinopril (ZESTRIL) 10 MG tablet    Other Relevant Orders    CBC with Differential/Platelet    CMP14+EGFR    Bayer DCA Hb A1c Waived     Expected Outcome/Goals Addressed This Visit (Provider CCM goals/Provider Assessment and plan  CCM (DIABETES) EXPECTED OUTCOME:  MONITOR, SELF-MANAGE AND REDUCE SYMPTOMS OF DIABETES  Symptom Management Condition 2: Take medications as prescribed   Attend all scheduled provider appointments Call pharmacy for medication refills 3-7 days in advance of running out of medications Attend church or other social activities Perform all self care activities independently  Perform IADL's (shopping, preparing meals, housekeeping, managing finances) independently Call provider office for new concerns or questions  check blood sugar at prescribed times: twice daily check feet daily for cuts, sores or redness enter blood sugar readings and medication or insulin into daily log take the blood sugar log to all doctor visits take the blood sugar meter to all doctor visits trim toenails straight across fill half of plate with vegetables limit fast food meals to no more than 1 per week manage portion size read food labels for fat, fiber, carbohydrates and portion size keep feet up while sitting wash and dry feet carefully every day Look over education via my chart- hypoglycemia Expect a call from care guide for food resources  Chronic Condition 3: CHRONIC KIDNEY DISEASE Provider Assessment and Plan  CKD stage 4. 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 Pateint CKD marked with AKI  Creatinine Trend 2023  1.7--2.2==>3.0==>2.7--2.8 3.0 after nephrectomy 2022 1.6--1.9 2021 1.2--1.7 2020 1.3--1.6 2019 1.3 2018 1.1--1.3 2017 1.3 2016 1.4 2014 1.1--1.7 2011 1.33  I educated patient about the reasons of the kidney disease/progression of the kidney disease. I answered patient's queries to the best of my ability ,patient voiced understanding.   Expected Outcome/Goals Addressed This Visit (Provider CCM goals/Provider Assessment and plan  CCM (CHRONIC KIDNEY DISEASE) EXPECTED OUTCOME: MONITOR, SELF-MANAGE AND REDUCE SYMPTOMS OF CHRONIC KIDNEY DISEASE  Symptom Management Condition 3: Take medications as prescribed   Attend all scheduled provider appointments Call pharmacy for medication refills 3-7 days in advance of running out of medications Attend church or other social activities Perform all self care activities independently  Perform IADL's (shopping, preparing meals, housekeeping, managing finances) independently Call provider office for new concerns or questions   Problem List Patient Active Problem List   Diagnosis Date Noted   Chronic pain of right knee 01/26/2022   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   Thickened endometrium 03/24/2021   Vaginal bleeding 03/24/2021   Difficulty sleeping 02/10/2021   Positive colorectal cancer screening using Cologuard test 12/10/2020   Vitamin D insufficiency 11/27/2020   Diabetic nephropathy ([place])    CKD stage 3 due to type 2 diabetes mellitus ([place]) 03/14/2019   Type 2 diabetes mellitus with hyperglycemia, with long-term current use of insulin ([place]) 03/13/2019   Hyperlipidemia associated with type 2 diabetes mellitus ([place]) 03/13/2019   Hypertension associated with diabetes ([place]) 03/13/2019   Obesity (BMI 30-39.9) 03/13/2019   Chronic bronchitis ([place]) 03/13/2019    Medication Management  Current Outpatient Medications:    acetaminophen (TYLENOL) 500 MG tablet, Take 1,000 mg by mouth every 8 (eight) hours as needed for moderate pain.,  Disp: , Rfl:    albuterol (VENTOLIN HFA) 108 (90 Base) MCG/ACT inhaler, Inhale 2 puffs into the lungs every 6 (six) hours as needed for wheezing or shortness of breath., Disp: 18 g, Rfl: 2   amLODipine (NORVASC) 10 MG tablet, Take 1 tablet (10 mg total) by mouth daily., Disp: 90 tablet, Rfl: 1   atenolol (TENORMIN) 100 MG tablet, Take 1 tablet (100 mg total) by mouth every morning., Disp: 90 tablet, Rfl: 1   budesonide-formoterol (SYMBICORT) 160-4.5 MCG/ACT inhaler, Inhale 2 puffs into the lungs 2 (two) times daily., Disp: 3 each, Rfl: 5   calcium acetate (PHOSLO) 667 MG tablet, Take by mouth., Disp: , Rfl:    Camphor-Menthol-Methyl Sal (SALONPAS) 3.03-12-08 % PTCH, Apply 1 patch topically daily as needed (pain)., Disp: , Rfl:    Cholecalciferol 25 MCG (1000 UT) capsule, Take 1,000 Units by mouth daily., Disp: , Rfl:    docusate sodium (COLACE) 100 MG capsule, Take 1 capsule (100 mg total) by mouth 2 (two) times daily., Disp: , Rfl:    Dulaglutide (TRULICITY) 4.5 CM/0.3KJ SOPN, Inject 4.5 mg as directed once a week. (Patient taking differently: Inject 4.5 mg as directed every Sunday.), Disp: 6 mL, Rfl: 1   hydrochlorothiazide (HYDRODIURIL) 25 MG tablet, Take 1 tablet (25 mg total) by mouth daily., Disp: 90 tablet, Rfl: 1   Insulin Glargine (BASAGLAR KWIKPEN) 100 UNIT/ML, Inject 35 Units into the skin at bedtime., Disp: , Rfl:    insulin lispro (HUMALOG KWIKPEN) 100 UNIT/ML KwikPen, Inject 20-30 Units into the skin with breakfast, with lunch, and with evening meal. (Patient taking differently: Inject 10-15 Units into the skin See admin instructions.  10 units twice daily, 15 units at bedtime), Disp: 45 mL, Rfl: 11   Insulin Pen Needle (RELION PEN NEEDLES) 31G X 6 MM MISC, USE WITH INSULIN DAILY, Disp: 100 each, Rfl: 0   lovastatin (MEVACOR) 40 MG tablet, Take 1.5 tablets (60 mg total) by mouth at bedtime., Disp: 135 tablet, Rfl: 1   Menthol, Topical Analgesic, (BIOFREEZE) 4 % GEL, Apply 1 application.  topically daily as needed (pain)., Disp: , Rfl:    sodium bicarbonate 650 MG tablet, Take by mouth., Disp: , Rfl:    triamcinolone cream (KENALOG) 0.1 %, Apply 1 Application topically 2 (two) times daily., Disp: 30 g, Rfl: 0   amoxicillin-clavulanate (AUGMENTIN) 875-125 MG tablet, Take 1 tablet by mouth 2 (two) times daily. (Patient not taking: Reported on 02/10/2022), Disp: 14 tablet, Rfl: 0   benzonatate (TESSALON) 200 MG capsule, Take 1 capsule (200 mg total) by mouth 3 (three) times daily as needed for cough. Swallow a whole, do not chew (Patient not taking: Reported on 02/10/2022), Disp: 21 capsule, Rfl: 0   doxepin (SINEQUAN) 10 MG capsule, Take 1 capsule (10 mg total) by mouth at bedtime. (Patient not taking: Reported on 02/10/2022), Disp: 30 capsule, Rfl: 2   empagliflozin (JARDIANCE) 25 MG TABS tablet, Take 1 tablet (25 mg total) by mouth daily before breakfast. (Patient not taking: Reported on 02/10/2022), Disp: 90 tablet, Rfl: 1   Finerenone (KERENDIA) 10 MG TABS, Take 10 mg by mouth daily. (Patient not taking: Reported on 02/10/2022), Disp: 90 tablet, Rfl: 1   guaiFENesin (MUCINEX) 600 MG 12 hr tablet, Take 1 tablet (600 mg total) by mouth 2 (two) times daily. (Patient not taking: Reported on 02/10/2022), Disp: 30 tablet, Rfl: 0   HYDROcodone-acetaminophen (NORCO) 5-325 MG tablet, Take 1-2 tablets by mouth every 6 (six) hours as needed for moderate pain or severe pain. (Patient not taking: Reported on 02/10/2022), Disp: 20 tablet, Rfl: 0   lisinopril (ZESTRIL) 10 MG tablet, Take 1 tablet (10 mg total) by mouth daily. (Patient not taking: Reported on 02/10/2022), Disp: 90 tablet, Rfl: 1   methocarbamol (ROBAXIN) 500 MG tablet, Take 1 tablet (500 mg total) by mouth every 8 (eight) hours as needed for muscle spasms. (Patient not taking: Reported on 02/10/2022), Disp: 60 tablet, Rfl: 1   predniSONE (DELTASONE) 20 MG tablet, Take 1 tablet (20 mg total) by mouth daily with breakfast. (Patient not taking:  Reported on 02/10/2022), Disp: 6 tablet, Rfl: 0  Cognitive Assessment Identity Confirmed: : Name; DOB Cognitive Status: Normal   Functional Assessment Hearing Difficulty or Deaf: no Wear Glasses or Blind: yes Vision Management: can see well/ w/ glasses Concentrating, Remembering or Making Decisions Difficulty (CP): no Difficulty Communicating: no Difficulty Eating/Swallowing: no Walking or Climbing Stairs Difficulty: yes (uses railing) Walking or Climbing Stairs: stair climbing difficulty, requires equipment Mobility Management: uses hand railing Dressing/Bathing Difficulty: no Doing Errands Independently Difficulty (such as shopping) (CP): no Change in Functional Status Since Onset of Current Illness/Injury: no   Caregiver Assessment  Primary Source of Support/Comfort: spouse Name of Support/Comfort Primary Source: spouse People in Home: spouse Name(s) of People in Home: spouse  pt does not provide name Family Caregiver if Needed: none   Planned Interventions  Evaluation of current treatment plan related to hypertension self management and patient's adherence to plan as established by provider;   Reviewed prescribed diet low sodium Reviewed medications with patient and discussed importance of compliance;  Counseled on the importance of exercise goals with target of 150  minutes per week Discussed plans with patient for ongoing care management follow up and provided patient with direct contact information for care management team; Advised patient, providing education and rationale, to monitor blood pressure daily and record, calling PCP for findings outside established parameters;  Discussed complications of poorly controlled blood pressure such as heart disease, stroke, circulatory complications, vision complications, kidney impairment, sexual dysfunction;  Screening for signs and symptoms of depression related to chronic disease state;  Assessed social determinant of health  barriers;  Reviewed upcoming scheduled appointments - pharmacist on 03/11/22 Provided education to patient about basic DM disease process; Reviewed medications with patient and discussed importance of medication adherence;        Reviewed prescribed diet with patient carbohydrate modified; Counseled on importance of regular laboratory monitoring as prescribed;        Discussed plans with patient for ongoing care management follow up and provided patient with direct contact information for care management team;      Provided patient with written educational materials related to hypo and hyperglycemia and importance of correct treatment;       Referral made to community resources care guide team for assistance with food insecurity;      Review of patient status, including review of consultants reports, relevant laboratory and other test results, and medications completed;       Screening for signs and symptoms of depression related to chronic disease state;        Assessed social determinant of health barriers;        Assessed the patient     understanding of chronic kidney disease    Reviewed prescribed diet low sodium, being mindful of phosphorus intake Reviewed medications with patient and discussed importance of compliance    Counseled on the importance of exercise goals with target of 150 minutes per week     Advised patient, providing education and rationale, to monitor blood pressure daily and record, calling PCP for findings outside established parameters    Discussed complications of poorly controlled blood pressure such as heart disease, stroke, circulatory complications, vision complications, kidney impairment, sexual dysfunction    Discussed plans with patient for ongoing care management follow up and provided patient with direct contact information for care management team    Screening for signs and symptoms of depression related to chronic disease state      Assessed social determinant  of health barriers     Interaction and coordination with outside resources, practitioners, and providers See CCM Referral  Care Plan: Available in [facility]

## 2022-02-11 ENCOUNTER — Telehealth: Payer: Self-pay

## 2022-02-11 NOTE — Telephone Encounter (Signed)
   Telephone encounter was:  Unsuccessful.  02/11/2022 Name: Brooke [name] MRN: [id number] DOB: Jun 29, 1955  Unsuccessful outbound call made today to assist with:  Food Insecurity  Outreach Attempt:  1st Attempt  A HIPAA compliant voice message was left requesting a return call.  Instructed patient to call back    [facility]  [phone] [address], [place], [place] [address] Phone: [phone] Email: [email or web address]

## 2022-02-14 ENCOUNTER — Telehealth: Payer: Self-pay

## 2022-02-14 NOTE — Telephone Encounter (Signed)
   Telephone encounter was:  Successful.  02/14/2022 Name: Minetta Krisher MRN: [id number] DOB: [birth year]  Aleeta Schmaltz is a 66 y.o. year old female who is a primary care patient of Ivy Lynn, NP . The community resource team was consulted for assistance with  financial strain  Care guide performed the following interventions: Patient provided with information about care guide support team and interviewed to confirm resource needs.Patient stated she needs assistance with food and utilities due to financial strain. I have put in a referral for [facility] as well as mailed resources to the patient   Follow Up Plan:  No further follow up planned at this time. The patient has been provided with needed resources.    [facility], Care Management  [phone] [address], [place], [place] [address] Phone: [phone] Email: [email or web address]

## 2022-02-15 DIAGNOSIS — N289 Disorder of kidney and ureter, unspecified: Secondary | ICD-10-CM | POA: Diagnosis not present

## 2022-02-15 DIAGNOSIS — C641 Malignant neoplasm of right kidney, except renal pelvis: Secondary | ICD-10-CM | POA: Diagnosis not present

## 2022-02-15 DIAGNOSIS — K5730 Diverticulosis of large intestine without perforation or abscess without bleeding: Secondary | ICD-10-CM | POA: Diagnosis not present

## 2022-02-24 DIAGNOSIS — C641 Malignant neoplasm of right kidney, except renal pelvis: Secondary | ICD-10-CM | POA: Diagnosis not present

## 2022-02-24 DIAGNOSIS — Z905 Acquired absence of kidney: Secondary | ICD-10-CM | POA: Diagnosis not present

## 2022-03-06 DIAGNOSIS — E1159 Type 2 diabetes mellitus with other circulatory complications: Secondary | ICD-10-CM

## 2022-03-06 DIAGNOSIS — I152 Hypertension secondary to endocrine disorders: Secondary | ICD-10-CM

## 2022-03-10 IMAGING — MR MR ABDOMEN WO/W CM
20 series · 48 of 48 positions shown · IV contrast (10 ml Gadavist)
Comparison: Renal sonogram December 03, 2020.

CLINICAL DATA: A 64-year-old female presents for evaluation of
ultrasound findings suspicious for renal mass.

EXAM:
MRI ABDOMEN WITHOUT AND WITH CONTRAST
TECHNIQUE: Multiplanar multisequence MR imaging of the abdomen was performed
both before and after the administration of intravenous contrast.
CONTRAST:  10mL GADAVIST GADOBUTROL 1 MMOL/ML IV SOLN

[Series 3: cor haste · coronal · 6.0mm · 1.19mm/px · 1 of 38 slices shown]
[im 1/38]
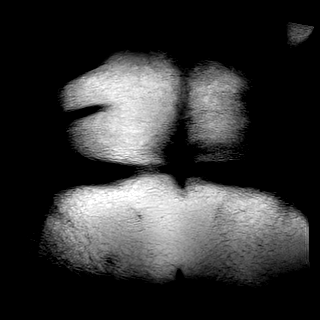

[Series 4: ax haste · axial · 6.0mm · 1.19mm/px · 1 of 40 slices shown]
[im 1/40]
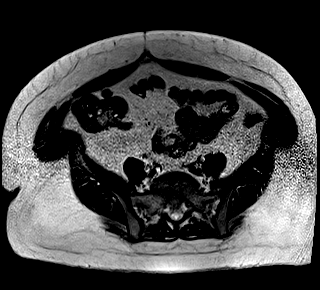

[Series 6: T2 fat-sat · axial · 6.0mm · 1.19mm/px · 1 of 40 slices shown]
[im 1/40]
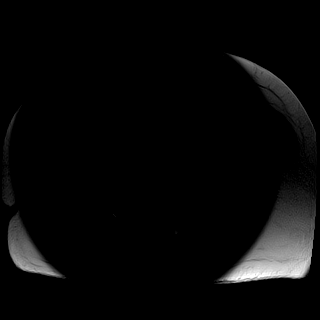

[Series 7: ax in and · axial · 3.5mm · 1.31mm/px · z∈[-103,+174]mm · 2 of 80 slices shown (1 of 2)]
[im 1/80]
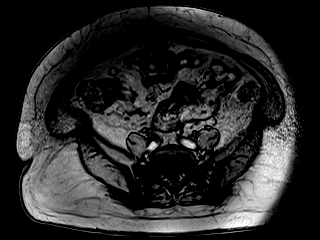
[im 80/80]
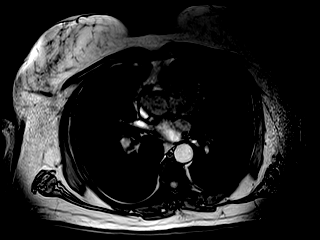

[Series 8: ax in and · axial · 3.5mm · 1.31mm/px · z∈[-103,+174]mm · 3 of 80 slices shown (2 of 2)]
[im 1/80]
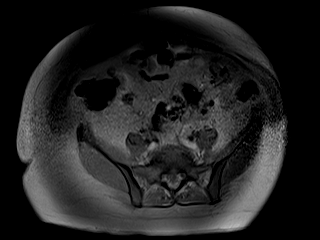
[im 40/80]
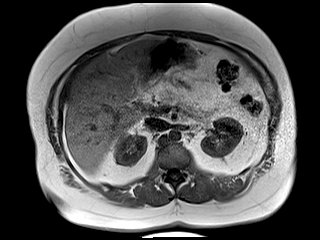
[im 80/80]
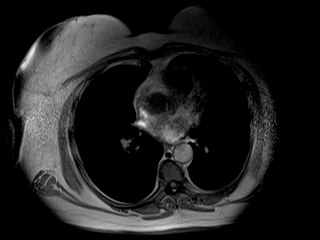

[Series 9: DWI · axial · 6.0mm · 1.42mm/px · z∈[-105,+176]mm · 2 of 40 slices shown (1 of 4)]
[im 1/40]
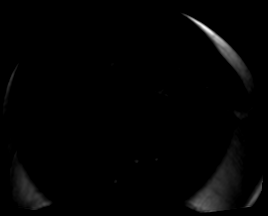
[im 40/40]
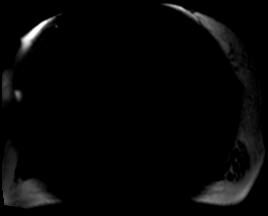

[Series 9: DWI · axial · 6.0mm · 1.42mm/px · z∈[-105,+176]mm · 2 of 40 slices shown (2 of 4)]
[im 1/40]
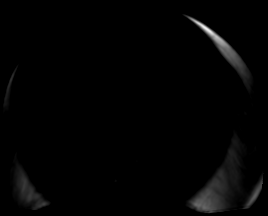
[im 40/40]
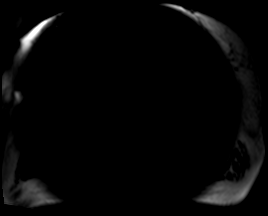

[Series 9: DWI · axial · 6.0mm · 1.42mm/px · z∈[-105,+176]mm · 2 of 40 slices shown (3 of 4)]
[im 1/40]
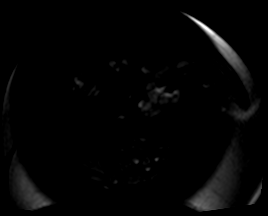
[im 40/40]
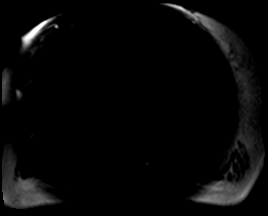

[Series 10: DWI · axial · 6.0mm · 1.42mm/px · z∈[-105,+176]mm · 2 of 40 slices shown (4 of 4)]
[im 1/40]
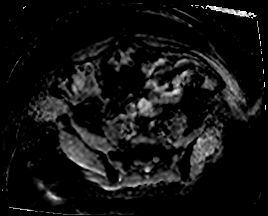
[im 40/40]
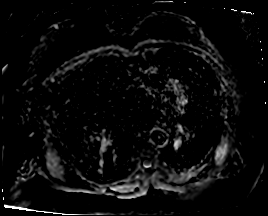

[Series 11: bSSFP · axial · 6.0mm · 0.74mm/px · z∈[-105,+176]mm · 2 of 40 slices shown]
[im 1/40]
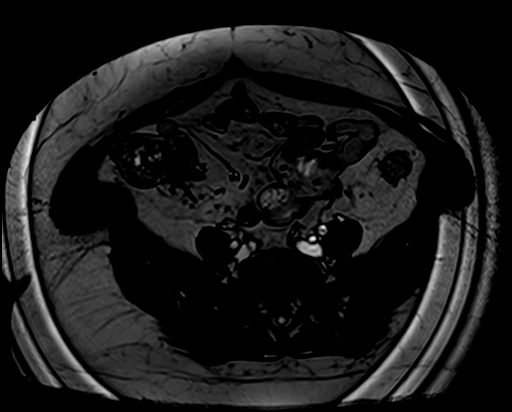
[im 40/40]
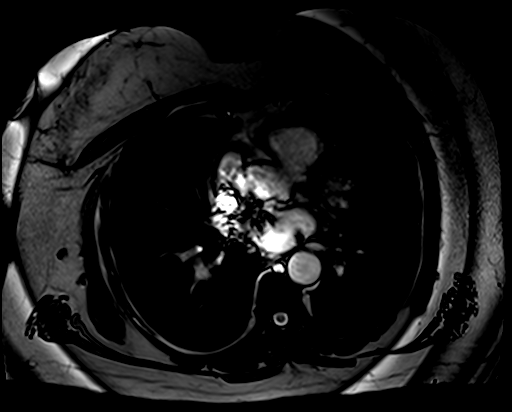

[Series 12: t1_vibe_fs_tra_p4_bh_pre · axial · 3.0mm · 1.34mm/px · z∈[-76,+161]mm · 3 of 80 slices shown]
[im 1/80]
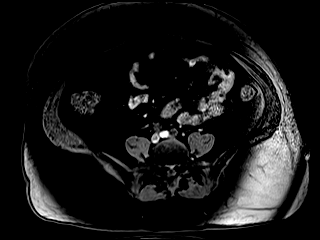
[im 40/80]
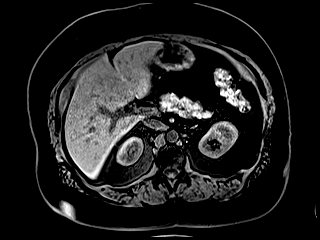
[im 80/80]
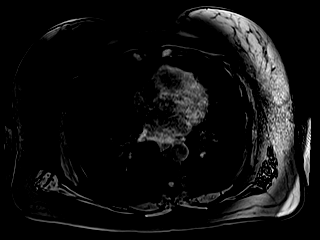

[Series 14: t1_vibe_fs_tra_p4_bh_post · axial · 3.0mm · 1.34mm/px · z∈[-76,+161]mm · 3 of 80 slices shown (1 of 4)]
[im 1/80]
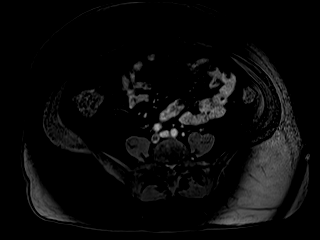
[im 40/80]
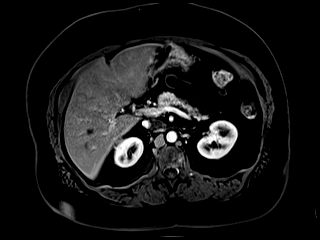
[im 80/80]
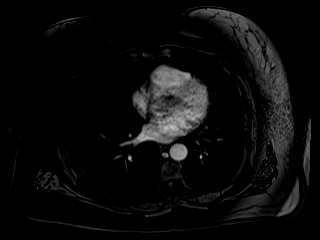

[Series 15: t1_vibe_fs_tra_p4_bh_post_sub · axial · 3.0mm · 1.34mm/px · z∈[-76,+161]mm · 3 of 80 slices shown (1 of 4)]
[im 1/80]
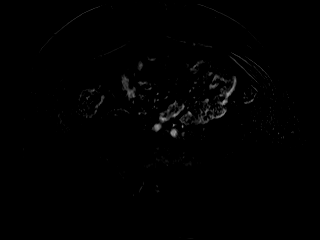
[im 40/80]
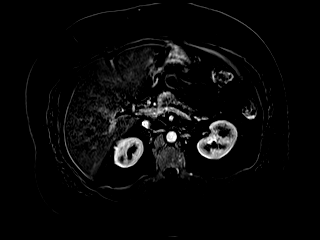
[im 80/80]
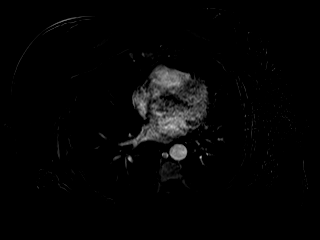

[Series 16: t1_vibe_fs_tra_p4_bh_post · axial · 3.0mm · 1.34mm/px · z∈[-76,+161]mm · 3 of 80 slices shown (2 of 4)]
[im 1/80]
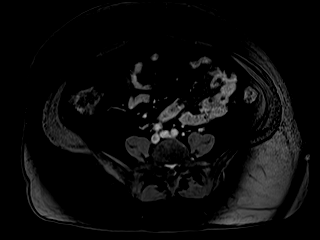
[im 40/80]
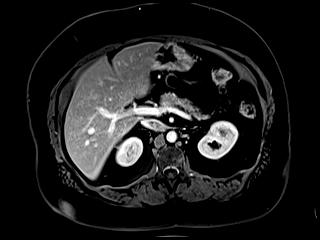
[im 80/80]
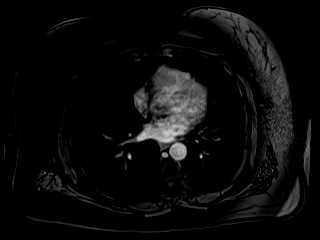

[Series 17: t1_vibe_fs_tra_p4_bh_post_sub · axial · 3.0mm · 1.34mm/px · z∈[-76,+161]mm · 3 of 80 slices shown (2 of 4)]
[im 1/80]
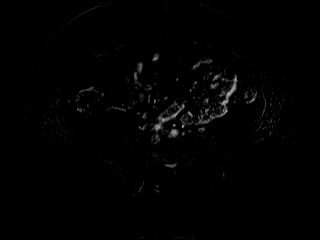
[im 40/80]
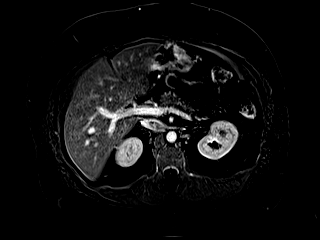
[im 80/80]
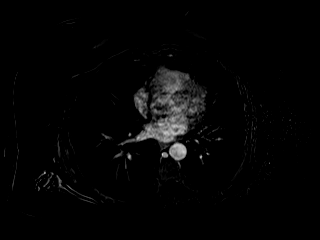

[Series 18: t1_vibe_fs_tra_p4_bh_post · axial · 3.0mm · 1.34mm/px · z∈[-76,+161]mm · 3 of 80 slices shown (3 of 4)]
[im 1/80]
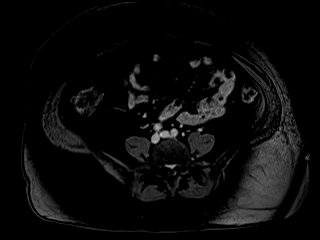
[im 40/80]
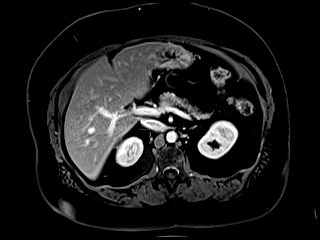
[im 80/80]
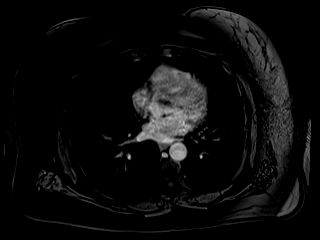

[Series 19: t1_vibe_fs_tra_p4_bh_post_sub · axial · 3.0mm · 1.34mm/px · z∈[-76,+161]mm · 3 of 80 slices shown (3 of 4)]
[im 1/80]
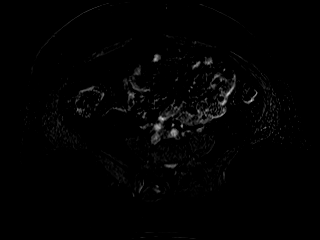
[im 40/80]
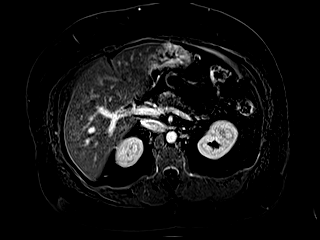
[im 80/80]
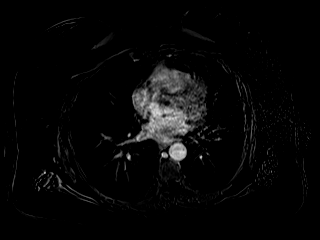

[Series 20: t1_vibe_fs_tra_p4_bh_post · axial · 3.0mm · 1.34mm/px · z∈[-76,+161]mm · 3 of 80 slices shown (4 of 4)]
[im 1/80]
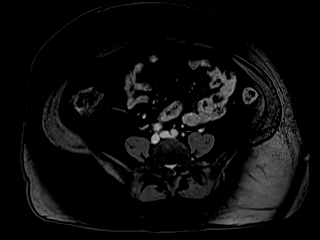
[im 40/80]
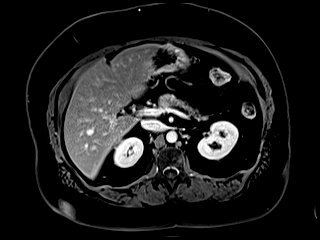
[im 80/80]
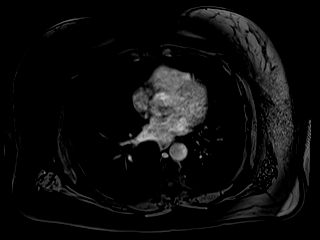

[Series 21: t1_vibe_fs_tra_p4_bh_post_sub · axial · 3.0mm · 1.34mm/px · z∈[-76,+161]mm · 3 of 80 slices shown (4 of 4)]
[im 1/80]
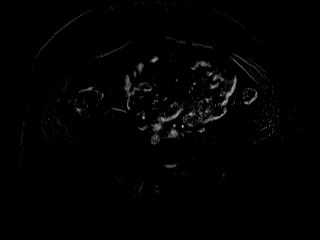
[im 40/80]
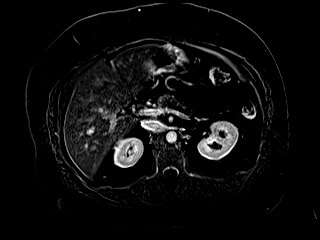
[im 80/80]
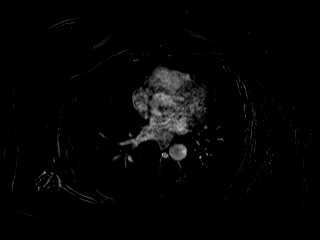

[Series 22: T1 dynamic post-contrast · coronal · 3.0mm · 1.31mm/px · 3 of 80 slices shown]
[im 1/80]
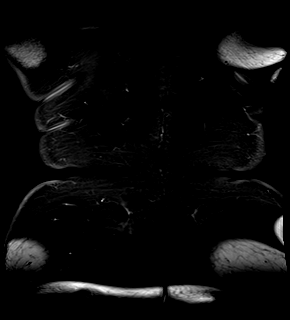
[im 40/80]
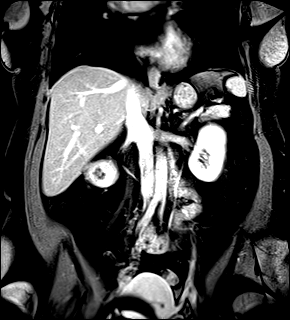
[im 80/80]
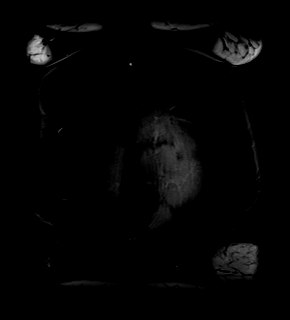

[48 of 48 positions shown; findings below may reference images not displayed]

FINDINGS: Lower chest: Lung bases are clear. No sign of effusion or
consolidation, limited assessment on MRI.

Hepatobiliary: Mild hepatic steatosis. Sludge layering in the
gallbladder. No focal, suspicious hepatic lesion or sign of biliary
duct distension. No pericholecystic stranding. Portal vein is
patent. Hepatic veins are patent.

Pancreas: Normal intrinsic T1 signal. No ductal dilation or sign of
inflammation. No focal lesion.

Spleen:  Normal size and contour without focal lesion.

Adrenals/Urinary Tract:  Adrenal glands are normal.

RIGHT kidney: Heterogeneous mass arises from the lower pole of the
RIGHT kidney extending inferiorly from the anterolateral lower pole.
This measures 4.2 x 3.9 cm greatest axial dimension. No renal venous
involvement or infiltration of the perinephric fat. No adenopathy in
the retroperitoneum. Mass extends to the lower hilar line.

LEFT kidney: Cyst arising from the lower pole of the LEFT kidney
containing hemorrhagic or proteinaceous material.

No hydronephrosis.  No perinephric stranding.

Stomach/Bowel: Unremarkable to the extent evaluated on abdominal
MRI.

Vascular/Lymphatic: No pathologically enlarged lymph nodes
identified. No abdominal aortic aneurysm demonstrated.

Other:  No ascites.

Suggestion of thickened endometrium perhaps as much as 9-10 mm in
this postmenopausal patient (image [DATE]) adnexal structures are not
visible. Uterus is incompletely evaluated.

Musculoskeletal: No suspicious bone lesions identified.
IMPRESSION: Heterogeneous mass, mesophytic, and slightly greater than 4 cm
arising from the anterolateral lower pole of the RIGHT kidney
extending to the renal sinus fat in the lower hilar line without
signs of adenopathy or renal venous invasion. Compatible with renal
neoplasm, suspicious for renal cell carcinoma.

Suggestion of thickened endometrium in this postmenopausal patient.
Suggest dedicated pelvic sonogram for further evaluation. Would also
correlate with any signs of abnormal bleeding in this postmenopausal
patient.

Mild hepatic steatosis.

These results will be called to the ordering clinician or
representative by the Radiologist Assistant, and communication
documented in the PACS or [REDACTED].

## 2022-03-11 ENCOUNTER — Telehealth: Payer: Medicare HMO | Admitting: Pharmacist

## 2022-03-11 ENCOUNTER — Encounter: Payer: Self-pay | Admitting: *Deleted

## 2022-03-18 NOTE — Telephone Encounter (Signed)
Received notification from [facility] regarding RE-ENROLLMENT approval for TRULICITY 4.'5MG'$ , BASAGLAR AND HUMALOG. Patient assistance approved from 03/17/22 to 03/07/23.  MEDICATIONS WILL SHIP TO PT'S HOME  Phone: [phone]

## 2022-03-21 ENCOUNTER — Ambulatory Visit: Payer: Medicare HMO | Admitting: Nurse Practitioner

## 2022-03-27 ENCOUNTER — Other Ambulatory Visit: Payer: Self-pay | Admitting: Family Medicine

## 2022-03-27 DIAGNOSIS — E1165 Type 2 diabetes mellitus with hyperglycemia: Secondary | ICD-10-CM

## 2022-03-27 DIAGNOSIS — E1169 Type 2 diabetes mellitus with other specified complication: Secondary | ICD-10-CM

## 2022-03-29 ENCOUNTER — Ambulatory Visit: Payer: Medicare HMO | Admitting: Nurse Practitioner

## 2022-04-01 ENCOUNTER — Other Ambulatory Visit (HOSPITAL_COMMUNITY): Payer: Self-pay

## 2022-04-04 DIAGNOSIS — Z905 Acquired absence of kidney: Secondary | ICD-10-CM | POA: Diagnosis not present

## 2022-04-04 DIAGNOSIS — E211 Secondary hyperparathyroidism, not elsewhere classified: Secondary | ICD-10-CM | POA: Diagnosis not present

## 2022-04-04 DIAGNOSIS — R809 Proteinuria, unspecified: Secondary | ICD-10-CM | POA: Diagnosis not present

## 2022-04-04 DIAGNOSIS — E1129 Type 2 diabetes mellitus with other diabetic kidney complication: Secondary | ICD-10-CM | POA: Diagnosis not present

## 2022-04-04 DIAGNOSIS — I129 Hypertensive chronic kidney disease with stage 1 through stage 4 chronic kidney disease, or unspecified chronic kidney disease: Secondary | ICD-10-CM | POA: Diagnosis not present

## 2022-04-04 DIAGNOSIS — N19 Unspecified kidney failure: Secondary | ICD-10-CM | POA: Diagnosis not present

## 2022-04-04 DIAGNOSIS — N189 Chronic kidney disease, unspecified: Secondary | ICD-10-CM | POA: Diagnosis not present

## 2022-04-04 DIAGNOSIS — E1122 Type 2 diabetes mellitus with diabetic chronic kidney disease: Secondary | ICD-10-CM | POA: Diagnosis not present

## 2022-04-07 ENCOUNTER — Encounter: Payer: Self-pay | Admitting: Family Medicine

## 2022-04-07 ENCOUNTER — Ambulatory Visit (INDEPENDENT_AMBULATORY_CARE_PROVIDER_SITE_OTHER): Payer: Medicare HMO | Admitting: *Deleted

## 2022-04-07 ENCOUNTER — Telehealth (INDEPENDENT_AMBULATORY_CARE_PROVIDER_SITE_OTHER): Payer: Medicare HMO | Admitting: Family Medicine

## 2022-04-07 DIAGNOSIS — E1122 Type 2 diabetes mellitus with diabetic chronic kidney disease: Secondary | ICD-10-CM | POA: Diagnosis not present

## 2022-04-07 DIAGNOSIS — Z794 Long term (current) use of insulin: Secondary | ICD-10-CM | POA: Diagnosis not present

## 2022-04-07 DIAGNOSIS — E785 Hyperlipidemia, unspecified: Secondary | ICD-10-CM | POA: Diagnosis not present

## 2022-04-07 DIAGNOSIS — E1165 Type 2 diabetes mellitus with hyperglycemia: Secondary | ICD-10-CM

## 2022-04-07 DIAGNOSIS — I129 Hypertensive chronic kidney disease with stage 1 through stage 4 chronic kidney disease, or unspecified chronic kidney disease: Secondary | ICD-10-CM

## 2022-04-07 DIAGNOSIS — E1169 Type 2 diabetes mellitus with other specified complication: Secondary | ICD-10-CM | POA: Diagnosis not present

## 2022-04-07 DIAGNOSIS — I152 Hypertension secondary to endocrine disorders: Secondary | ICD-10-CM

## 2022-04-07 DIAGNOSIS — N183 Chronic kidney disease, stage 3 unspecified: Secondary | ICD-10-CM

## 2022-04-07 DIAGNOSIS — E1159 Type 2 diabetes mellitus with other circulatory complications: Secondary | ICD-10-CM

## 2022-04-07 DIAGNOSIS — E538 Deficiency of other specified B group vitamins: Secondary | ICD-10-CM

## 2022-04-07 NOTE — Patient Instructions (Signed)
Please call the care guide team at [phone] if you need to cancel or reschedule your appointment.   If you are experiencing a Mental Health or [place] or need someone to talk to, please call the Suicide and Crisis Lifeline: 988 call the [place] National Suicide Prevention Lifeline: 380 747 5880 or TTY: 954-581-5239 TTY (734)462-9323) to talk to a trained counselor call 1-800-273-TALK (toll free, 24 hour hotline) go to [facility] [address] [place] [phone]) call the [facility]: [phone] call 911   Following is a copy of the CCM Program Consent:  CCM service includes personalized support from designated clinical staff supervised by the physician, including individualized plan of care and coordination with other care providers 24/7 contact phone numbers for assistance for urgent and routine care needs. Service will only be billed when office clinical staff spend 20 minutes or more in a month to coordinate care. Only one practitioner may furnish and bill the service in a calendar month. The patient may stop CCM services at amy time (effective at the end of the month) by phone call to the office staff. The patient will be responsible for cost sharing (co-pay) or up to 20% of the service fee (after annual deductible is met)  Following is a copy of your full provider care plan:   Goals Addressed             This Visit's Progress    CCM (CHRONIC KIDNEY DISEASE) EXPECTED OUTCOME: MONITOR, SELF-MANAGE AND REDUCE SYMPTOMS OF CHRONIC KIDNEY DISEASE       Current Barriers:  Knowledge Deficits related to CKD Chronic Disease Management support and education needs related to CKD No Advanced Directives in place- Pt declines Pt reports she is concerned about her kidneys and trying to keep blood sugar, blood pressure WNL to preserve kidney function, does not drink sugary drinks Pt states she is working on her  diet  Planned Interventions: Assessed the patient     understanding of chronic kidney disease    Evaluation of current treatment plan related to chronic kidney disease self management and patient's adherence to plan as established by provider      Reviewed prescribed diet low sodium, being mindful of phosphorus intake Reviewed medications with patient and discussed importance of compliance    Counseled on the importance of exercise goals with target of 150 minutes per week     Advised patient, providing education and rationale, to monitor blood pressure daily and record, calling PCP for findings outside established parameters    Discussed complications of poorly controlled blood pressure such as heart disease, stroke, circulatory complications, vision complications, kidney impairment, sexual dysfunction    Reviewed importance of staying active  Symptom Management: Take medications as prescribed   Attend all scheduled provider appointments Call pharmacy for medication refills 3-7 days in advance of running out of medications Attend church or other social activities Perform all self care activities independently  Perform IADL's (shopping, preparing meals, housekeeping, managing finances) independently Call provider office for new concerns or questions  Try to do some type of exercise daily- walking is good  Follow Up Plan: Telephone follow up appointment with care management team member scheduled for:  06/1822 at 130 pm       CCM (DIABETES) EXPECTED OUTCOME:  MONITOR, SELF-MANAGE AND REDUCE SYMPTOMS OF DIABETES       Current Barriers:  Knowledge Deficits related to Diabetes management Chronic Disease 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  No Advanced Directives in place- pt declines Pt reports she checks CBG BID with fasting ranges 99- 132, random ranges 170-180 Pt reports she tried to eat healthy and be mindful of carbohydrate intake Pt  reports care guide did outreach her with food resources, pt continues utilizing [address] for hot meals Wed- Sat and groceries, fresh produce given  Planned Interventions: Reviewed medications with patient and discussed importance of medication adherence;        Reviewed prescribed diet with patient carbohydrate modified; Counseled on importance of regular laboratory monitoring as prescribed;        Advised patient, providing education and rationale, to check cbg twice daily and record        call provider for findings outside established parameters;       Review of patient status, including review of consultants reports, relevant laboratory and other test results, and medications completed;        Symptom Management: Take medications as prescribed   Attend all scheduled provider appointments Call pharmacy for medication refills 3-7 days in advance of running out of medications Attend church or other social activities Perform all self care activities independently  Perform IADL's (shopping, preparing meals, housekeeping, managing finances) independently Call provider office for new concerns or questions  check blood sugar at prescribed times: twice daily check feet daily for cuts, sores or redness enter blood sugar readings and medication or insulin into daily log take the blood sugar log to all doctor visits take the blood sugar meter to all doctor visits trim toenails straight across fill half of plate with vegetables limit fast food meals to no more than 1 per week manage portion size prepare main meal at home 3 to 5 days each week read food labels for fat, fiber, carbohydrates and portion size do heel pump exercise 2 to 3 times each day keep feet up while sitting wash and dry feet carefully every day  Follow Up Plan: Telephone follow up appointment with care management team member scheduled for:  06/23/22 at 130 pm       CCM (HYPERTENSION) EXPECTED OUTCOME: MONITOR, SELF-MANAGE  AND REDUCE SYMPTOMS OF HYPERTENSION       Current Barriers:  Knowledge Deficits related to Hypertension management Chronic Disease Management support and education needs related to Hypertension No Advanced Directives in place- pt declines Pt reports she lives with spouse, is independent in all aspects of her care, does not exercise, tries to adhere to healthy diet Pt reports she checks blood pressure on occasion and readings have been good  Planned Interventions: Evaluation of current treatment plan related to hypertension self management and patient's adherence to plan as established by provider;   Reviewed prescribed diet low sodium Reviewed medications with patient and discussed importance of compliance;  Counseled on the importance of exercise goals with target of 150 minutes per week Discussed plans with patient for ongoing care management follow up and provided patient with direct contact information for care management team; Advised patient, providing education and rationale, to monitor blood pressure daily and record, calling PCP for findings outside established parameters;  Discussed complications of poorly controlled blood pressure such as heart disease, stroke, circulatory complications, vision complications, kidney impairment, sexual dysfunction;  Screening for signs and symptoms of depression related to chronic disease state;  Assessed social determinant of health barriers;  Reviewed upcoming scheduled appointments - pharmacist 04/22/22 telephone call  Symptom Management: Take medications as prescribed   Attend all scheduled provider appointments Call  pharmacy for medication refills 3-7 days in advance of running out of medications Attend church or other social activities Perform all self care activities independently  Perform IADL's (shopping, preparing meals, housekeeping, managing finances) independently Call provider office for new concerns or questions  check blood  pressure weekly choose a place to take my blood pressure (home, clinic or office, retail store) write blood pressure results in a log or diary learn about high blood pressure keep a blood pressure log take blood pressure log to all doctor appointments keep all doctor appointments take medications for blood pressure exactly as prescribed begin an exercise program report new symptoms to your doctor eat more whole grains, fruits and vegetables, lean meats and healthy fats Follow low sodium diet Pharmacist to call on 04/22/22  Follow Up Plan: Telephone follow up appointment with care management team member scheduled for: 06/23/22 at 130 pm          Patient verbalizes understanding of instructions and care plan provided today and agrees to view in [place]. Active MyChart status and patient understanding of how to access instructions and care plan via MyChart confirmed with patient.     Telephone follow up appointment with care management team member scheduled for:  06/23/22 at 130 pm

## 2022-04-07 NOTE — Progress Notes (Signed)
Virtual Visit via telephone Note Due to COVID-19 pandemic this visit was conducted virtually. This visit type was conducted due to national recommendations for restrictions regarding the COVID-19 Pandemic (e.g. social distancing, sheltering in place) in an effort to limit this patient's exposure and mitigate transmission in our community. All issues noted in this document were discussed and addressed.  A physical exam was not performed with this format.   I connected with Brooke [name] on 04/07/2022 at 1020 by telephone and verified that I am speaking with the correct person using two identifiers. Brooke [name] is currently located at home and patient is currently with them during visit. The provider, Monia Pouch, FNP is located in their office at time of visit.  I discussed the limitations, risks, security and privacy concerns of performing an evaluation and management service by virtual visit and the availability of in person appointments. I also discussed with the patient that there may be a patient responsible charge related to this service. The patient expressed understanding and agreed to proceed.  Subjective:  Patient ID: Brooke [name], female    DOB: [birth year], 67 y.o.   MRN: [id number]  Chief Complaint:  Medical Management of Chronic Issues and Establish Care   HPI: Brooke [name] is a 67 y.o. female presenting on 04/07/2022 for Medical Management of Chronic Issues and Establish Care   Pt presenting today to establish care with new PCP and for management of chronic medical conditions. She has T2DM with associated CKD, HTN, and hyperlipidemia. She is s/p right nephrectomy and is followed by nephrology on a regular basis. Medications have been adjusted recently by nephrologist. She has a follow up next week and will have labs completed at this time. Will need to get A1C, lipids, Vit D, and Vit B 12 completed here. She states her blood sugars have been running good, has had some  higher readings but attributes this to her diet. States she will have a snack every now and then but not on a regular basis. No polyuria, polydipsia, or polyphagia. Reports blood pressure is well controlled. No headaches, chest pain, leg swelling, weakness, confusion, visual disturbances, or syncope. No unexplained weight changes or night sweats. States she is doing well overall.      Relevant past medical, surgical, family, and social history reviewed and updated as indicated.  Allergies and medications reviewed and updated.   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   Hypertension    Hyperuricemia 11/27/2020   Obesity (BMI 35.0-39.9 without comorbidity)    Renal mass 08/04/2021   Vertigo    Vitamin D insufficiency 11/27/2020    Past Surgical History:  Procedure Laterality Date   BREAST SURGERY Right 1974   OPERATIVE ULTRASOUND N/A 08/04/2021   Procedure: OPERATIVE ULTRASOUND;  Surgeon: Alexis Frock, MD;  Location: [facility];  Service: Urology;  Laterality: N/A;   ROBOTIC ASSITED PARTIAL NEPHRECTOMY Right 08/04/2021   Procedure: XI ROBOTIC ASSITED RADICAL NEPHRECTOMY;  Surgeon: Alexis Frock, MD;  Location: [facility];  Service: Urology;  Laterality: Right;    Social History   Socioeconomic History   Marital status: Married    Spouse name: Donnie   Number  of children: 1   Years of education: Not on file   Highest education level: Not on file  Occupational History   Not on file  Tobacco Use   Smoking status: Never   Smokeless tobacco: Never  Vaping Use   Vaping Use: Never used  Substance and Sexual Activity   Alcohol use: No   Drug use: No   Sexual activity: Never  Other Topics Concern   Not  on file  Social History Narrative   1 daughter    1 grandson   Social Determinants of Health   Financial Resource Strain: Medium Risk (02/14/2022)   Overall Financial Resource Strain (CARDIA)    Difficulty of Paying Living Expenses: Somewhat hard  Food Insecurity: Food Insecurity Present (02/14/2022)   Hunger Vital Sign    Worried About Running Out of Food in the Last Year: Often true    Ran Out of Food in the Last Year: Often true  Transportation Needs: No Transportation Needs (02/10/2022)   PRAPARE - Hydrologist (Medical): No    Lack of Transportation (Non-Medical): No  Physical Activity: Inactive (02/10/2022)   Exercise Vital Sign    Days of Exercise per Week: 0 days    Minutes of Exercise per Session: 0 min  Stress: No Stress Concern Present (02/10/2022)   [place]    Feeling of Stress : Only a little  Social Connections: Socially Integrated (02/10/2022)   Social Connection and Isolation Panel [NHANES]    Frequency of Communication with Friends and Family: More than three times a week    Frequency of Social Gatherings with Friends and Family: More than three times a week    Attends Religious Services: More than 4 times per year    Active Member of [employer] or Organizations: Not on file    Attends Archivist Meetings: More than 4 times per year    Marital Status: Married  Human resources officer Violence: Not At Risk (02/10/2022)   Humiliation, Afraid, Rape, and Kick questionnaire    Fear of Current or Ex-Partner: No    Emotionally Abused: No    Physically Abused: No    Sexually Abused: No    Outpatient Encounter Medications as of 04/07/2022  Medication Sig   acetaminophen (TYLENOL) 500 MG tablet Take 1,000 mg by mouth every 8 (eight) hours as needed for moderate pain.   albuterol (VENTOLIN HFA) 108 (90 Base) MCG/ACT inhaler Inhale 2 puffs into the lungs every 6 (six) hours as  needed for wheezing or shortness of breath.   amLODipine (NORVASC) 10 MG tablet Take 1 tablet (10 mg total) by mouth daily.   atenolol (TENORMIN) 100 MG tablet Take 1 tablet (100 mg total) by mouth every morning.   budesonide-formoterol (SYMBICORT) 160-4.5 MCG/ACT inhaler Inhale 2 puffs into the lungs 2 (two) times daily.   calcium acetate (PHOSLO) 667 MG tablet Take by mouth.   Camphor-Menthol-Methyl Sal (SALONPAS) 3.03-12-08 % PTCH Apply 1 patch topically daily as needed (pain).   Cholecalciferol 25 MCG (1000 UT) capsule Take 1,000 Units by mouth daily.   docusate sodium (COLACE) 100 MG capsule Take 1 capsule (100 mg total) by mouth 2 (two) times daily.   Dulaglutide (TRULICITY) 4.5 QA/8.3MH SOPN Inject 4.5 mg as directed once a week. (Patient taking differently: Inject 4.5 mg as directed every Sunday.)   hydrochlorothiazide (HYDRODIURIL) 25 MG tablet Take 1 tablet (25 mg total) by mouth daily.   Insulin  Glargine (BASAGLAR KWIKPEN) 100 UNIT/ML Inject 35 Units into the skin at bedtime.   insulin lispro (HUMALOG KWIKPEN) 100 UNIT/ML KwikPen Inject 20-30 Units into the skin with breakfast, with lunch, and with evening meal. (Patient taking differently: Inject 10-15 Units into the skin See admin instructions. 10 units twice daily, 15 units at bedtime)   Insulin Pen Needle (RELION PEN NEEDLES) 31G X 6 MM MISC USE WITH INSULIN DAILY   lovastatin (MEVACOR) 40 MG tablet TAKE 1 & 1/2 (ONE & ONE-HALF) TABLETS BY MOUTH AT BEDTIME   Menthol, Topical Analgesic, (BIOFREEZE) 4 % GEL Apply 1 application. topically daily as needed (pain).   sodium bicarbonate 650 MG tablet Take by mouth.   [DISCONTINUED] amoxicillin-clavulanate (AUGMENTIN) 875-125 MG tablet Take 1 tablet by mouth 2 (two) times daily. (Patient not taking: Reported on 02/10/2022)   [DISCONTINUED] benzonatate (TESSALON) 200 MG capsule Take 1 capsule (200 mg total) by mouth 3 (three) times daily as needed for cough. Swallow a whole, do not chew (Patient  not taking: Reported on 02/10/2022)   [DISCONTINUED] doxepin (SINEQUAN) 10 MG capsule Take 1 capsule (10 mg total) by mouth at bedtime. (Patient not taking: Reported on 02/10/2022)   [DISCONTINUED] empagliflozin (JARDIANCE) 25 MG TABS tablet Take 1 tablet (25 mg total) by mouth daily before breakfast. (Patient not taking: Reported on 02/10/2022)   [DISCONTINUED] Finerenone (KERENDIA) 10 MG TABS Take 10 mg by mouth daily. (Patient not taking: Reported on 02/10/2022)   [DISCONTINUED] guaiFENesin (MUCINEX) 600 MG 12 hr tablet Take 1 tablet (600 mg total) by mouth 2 (two) times daily. (Patient not taking: Reported on 02/10/2022)   [DISCONTINUED] HYDROcodone-acetaminophen (NORCO) 5-325 MG tablet Take 1-2 tablets by mouth every 6 (six) hours as needed for moderate pain or severe pain. (Patient not taking: Reported on 02/10/2022)   [DISCONTINUED] lisinopril (ZESTRIL) 10 MG tablet Take 1 tablet (10 mg total) by mouth daily. (Patient not taking: Reported on 02/10/2022)   [DISCONTINUED] methocarbamol (ROBAXIN) 500 MG tablet Take 1 tablet (500 mg total) by mouth every 8 (eight) hours as needed for muscle spasms. (Patient not taking: Reported on 02/10/2022)   [DISCONTINUED] predniSONE (DELTASONE) 20 MG tablet Take 1 tablet (20 mg total) by mouth daily with breakfast. (Patient not taking: Reported on 02/10/2022)   [DISCONTINUED] triamcinolone cream (KENALOG) 0.1 % Apply 1 Application topically 2 (two) times daily.   No facility-administered encounter medications on file as of 04/07/2022.    No Known Allergies  Review of Systems  Constitutional:  Negative for activity change, appetite change, chills, diaphoresis, fatigue, fever and unexpected weight change.  HENT: Negative.    Eyes: Negative.  Negative for photophobia and visual disturbance.  Respiratory:  Negative for cough, chest tightness and shortness of breath.   Cardiovascular:  Negative for chest pain, palpitations and leg swelling.  Gastrointestinal:  Negative  for abdominal pain, blood in stool, constipation, diarrhea, nausea and vomiting.  Endocrine: Negative.  Negative for polydipsia, polyphagia and polyuria.  Genitourinary:  Negative for decreased urine volume, difficulty urinating, dysuria, frequency and urgency.  Musculoskeletal:  Negative for arthralgias and myalgias.  Skin: Negative.   Allergic/Immunologic: Negative.   Neurological:  Negative for dizziness, tremors, seizures, syncope, facial asymmetry, speech difficulty, weakness, light-headedness, numbness and headaches.  Hematological: Negative.   Psychiatric/Behavioral:  Negative for confusion, hallucinations, sleep disturbance and suicidal ideas.   All other systems reviewed and are negative.        Observations/Objective: No vital signs or physical exam, this was a virtual health encounter.  Pt alert and oriented, answers all questions appropriately, and able to speak in full sentences.    Assessment and Plan: Cydni was seen today for medical management of chronic issues and establish care.  Diagnoses and all orders for this visit:  Type 2 diabetes mellitus with hyperglycemia, with long-term current use of insulin ([place]) Pt to come by office for labs. Adjustment to regimen if warranted.  -     Bayer DCA Hb A1c Waived; Future  Hypertension associated with diabetes ([place]) Has been well controlled. Will get lab results from nephrology and place in chart.   CKD stage 3 due to type 2 diabetes mellitus ([place]) Followed by nephrology on a regular basis, has appointment next week.  -     VITAMIN D 25 Hydroxy (Vit-D Deficiency, Fractures); Future  Hyperlipidemia associated with type 2 diabetes mellitus ([place]) Labs pending. Diet and exercise encouraged. Further treatment pending results.  -     Lipid panel; Future  B12 deficiency Will check labs and adjust repletion if warranted.  -     Vitamin B12; Future     Follow Up Instructions: Return in about 3 months (around 07/06/2022)  for DM.    I discussed the assessment and treatment plan with the patient. The patient was provided an opportunity to ask questions and all were answered. The patient agreed with the plan and demonstrated an understanding of the instructions.   The patient was advised to call back or seek an in-person evaluation if the symptoms worsen or if the condition fails to improve as anticipated.  The above assessment and management plan was discussed with the patient. The patient verbalized understanding of and has agreed to the management plan. Patient is aware to call the clinic if they develop any new symptoms or if symptoms persist or worsen. Patient is aware when to return to the clinic for a follow-up visit. Patient educated on when it is appropriate to go to the emergency department.    I provided 20 minutes of time during this telephone encounter.   Monia Pouch, FNP-C [facility] [address] [place], [place] [address] [phone] 04/07/2022

## 2022-04-07 NOTE — Patient Instructions (Signed)

## 2022-04-07 NOTE — Chronic Care Management (AMB) (Signed)
Chronic Care Management   CCM RN Visit Note  04/07/2022 Name: Brooke [name] MRN: [id number] DOB: [birth year]  Subjective: Brooke [name] is a 67 y.o. year old female who is a primary care patient of Rakes, Connye Burkitt, FNP. The patient was referred to the Chronic Care Management team for assistance with care management needs subsequent to provider initiation of CCM services and plan of care.    Today's Visit:  Engaged with patient by telephone for follow up visit.        Goals Addressed             This Visit's Progress    CCM (CHRONIC KIDNEY DISEASE) EXPECTED OUTCOME: MONITOR, SELF-MANAGE AND REDUCE SYMPTOMS OF CHRONIC KIDNEY DISEASE       Current Barriers:  Knowledge Deficits related to CKD Chronic Disease Management support and education needs related to CKD No Advanced Directives in place- Pt declines Pt reports she is concerned about her kidneys and trying to keep blood sugar, blood pressure WNL to preserve kidney function, does not drink sugary drinks Pt states she is working on her diet  Planned Interventions: Assessed the patient     understanding of chronic kidney disease    Evaluation of current treatment plan related to chronic kidney disease self management and patient's adherence to plan as established by provider      Reviewed prescribed diet low sodium, being mindful of phosphorus intake Reviewed medications with patient and discussed importance of compliance    Counseled on the importance of exercise goals with target of 150 minutes per week     Advised patient, providing education and rationale, to monitor blood pressure daily and record, calling PCP for findings outside established parameters    Discussed complications of poorly controlled blood pressure such as heart disease, stroke, circulatory complications, vision complications, kidney impairment, sexual dysfunction    Reviewed importance of staying active  Symptom Management: Take medications as  prescribed   Attend all scheduled provider appointments Call pharmacy for medication refills 3-7 days in advance of running out of medications Attend church or other social activities Perform all self care activities independently  Perform IADL's (shopping, preparing meals, housekeeping, managing finances) independently Call provider office for new concerns or questions  Try to do some type of exercise daily- walking is good  Follow Up Plan: Telephone follow up appointment with care management team member scheduled for:  06/1822 at 130 pm       CCM (DIABETES) EXPECTED OUTCOME:  MONITOR, SELF-MANAGE AND REDUCE SYMPTOMS OF DIABETES       Current Barriers:  Knowledge Deficits related to Diabetes management Chronic Disease Management support and education needs related to Diabetes Financial Constraints.  No Advanced Directives in place- pt declines Pt reports she checks CBG BID with fasting ranges 99- 132, random ranges 170-180 Pt reports she tried to eat healthy and be mindful of carbohydrate intake Pt reports care guide did outreach her with food resources, pt continues utilizing [address] for hot meals Wed- Sat and groceries, fresh produce given  Planned Interventions: Reviewed medications with patient and discussed importance of medication adherence;        Reviewed prescribed diet with patient carbohydrate modified; Counseled on importance of regular laboratory monitoring as prescribed;        Advised patient, providing education and rationale, to check cbg twice daily and record        call provider for findings outside established parameters;       Review of patient status,  including review of consultants reports, relevant laboratory and other test results, and medications completed;        Symptom Management: Take medications as prescribed   Attend all scheduled provider appointments Call pharmacy for medication refills 3-7 days in advance of running out of medications Attend  church or other social activities Perform all self care activities independently  Perform IADL's (shopping, preparing meals, housekeeping, managing finances) independently Call provider office for new concerns or questions  check blood sugar at prescribed times: twice daily check feet daily for cuts, sores or redness enter blood sugar readings and medication or insulin into daily log take the blood sugar log to all doctor visits take the blood sugar meter to all doctor visits trim toenails straight across fill half of plate with vegetables limit fast food meals to no more than 1 per week manage portion size prepare main meal at home 3 to 5 days each week read food labels for fat, fiber, carbohydrates and portion size do heel pump exercise 2 to 3 times each day keep feet up while sitting wash and dry feet carefully every day  Follow Up Plan: Telephone follow up appointment with care management team member scheduled for:  06/23/22 at 130 pm       CCM (HYPERTENSION) EXPECTED OUTCOME: MONITOR, SELF-MANAGE AND REDUCE SYMPTOMS OF HYPERTENSION       Current Barriers:  Knowledge Deficits related to Hypertension management Chronic Disease Management support and education needs related to Hypertension No Advanced Directives in place- pt declines Pt reports she lives with spouse, is independent in all aspects of her care, does not exercise, tries to adhere to healthy diet Pt reports she checks blood pressure on occasion and readings have been good  Planned Interventions: Evaluation of current treatment plan related to hypertension self management and patient's adherence to plan as established by provider;   Reviewed prescribed diet low sodium Reviewed medications with patient and discussed importance of compliance;  Counseled on the importance of exercise goals with target of 150 minutes per week Discussed plans with patient for ongoing care management follow up and provided patient with  direct contact information for care management team; Advised patient, providing education and rationale, to monitor blood pressure daily and record, calling PCP for findings outside established parameters;  Discussed complications of poorly controlled blood pressure such as heart disease, stroke, circulatory complications, vision complications, kidney impairment, sexual dysfunction;  Screening for signs and symptoms of depression related to chronic disease state;  Assessed social determinant of health barriers;  Reviewed upcoming scheduled appointments - pharmacist 04/22/22 telephone call  Symptom Management: Take medications as prescribed   Attend all scheduled provider appointments Call pharmacy for medication refills 3-7 days in advance of running out of medications Attend church or other social activities Perform all self care activities independently  Perform IADL's (shopping, preparing meals, housekeeping, managing finances) independently Call provider office for new concerns or questions  check blood pressure weekly choose a place to take my blood pressure (home, clinic or office, retail store) write blood pressure results in a log or diary learn about high blood pressure keep a blood pressure log take blood pressure log to all doctor appointments keep all doctor appointments take medications for blood pressure exactly as prescribed begin an exercise program report new symptoms to your doctor eat more whole grains, fruits and vegetables, lean meats and healthy fats Follow low sodium diet Pharmacist to call on 04/22/22  Follow Up Plan: Telephone follow up appointment with care  management team member scheduled for: 06/23/22 at 130 pm          Plan:Telephone follow up appointment with care management team member scheduled for:  06/23/22 at 130 pm  Jacqlyn Larsen [facility], BSN RN Case Manager [facility] [phone]

## 2022-04-11 DIAGNOSIS — N189 Chronic kidney disease, unspecified: Secondary | ICD-10-CM | POA: Diagnosis not present

## 2022-04-11 DIAGNOSIS — D638 Anemia in other chronic diseases classified elsewhere: Secondary | ICD-10-CM | POA: Diagnosis not present

## 2022-04-11 DIAGNOSIS — E1122 Type 2 diabetes mellitus with diabetic chronic kidney disease: Secondary | ICD-10-CM | POA: Diagnosis not present

## 2022-04-11 DIAGNOSIS — R809 Proteinuria, unspecified: Secondary | ICD-10-CM | POA: Diagnosis not present

## 2022-04-11 DIAGNOSIS — I129 Hypertensive chronic kidney disease with stage 1 through stage 4 chronic kidney disease, or unspecified chronic kidney disease: Secondary | ICD-10-CM | POA: Diagnosis not present

## 2022-04-11 DIAGNOSIS — Z905 Acquired absence of kidney: Secondary | ICD-10-CM | POA: Diagnosis not present

## 2022-04-11 DIAGNOSIS — E1129 Type 2 diabetes mellitus with other diabetic kidney complication: Secondary | ICD-10-CM | POA: Diagnosis not present

## 2022-04-11 DIAGNOSIS — E211 Secondary hyperparathyroidism, not elsewhere classified: Secondary | ICD-10-CM | POA: Diagnosis not present

## 2022-04-12 ENCOUNTER — Other Ambulatory Visit: Payer: Medicare HMO

## 2022-04-12 DIAGNOSIS — E785 Hyperlipidemia, unspecified: Secondary | ICD-10-CM | POA: Diagnosis not present

## 2022-04-12 DIAGNOSIS — E538 Deficiency of other specified B group vitamins: Secondary | ICD-10-CM

## 2022-04-12 DIAGNOSIS — N183 Chronic kidney disease, stage 3 unspecified: Secondary | ICD-10-CM | POA: Diagnosis not present

## 2022-04-12 DIAGNOSIS — E1169 Type 2 diabetes mellitus with other specified complication: Secondary | ICD-10-CM

## 2022-04-12 DIAGNOSIS — E1165 Type 2 diabetes mellitus with hyperglycemia: Secondary | ICD-10-CM

## 2022-04-12 DIAGNOSIS — E1122 Type 2 diabetes mellitus with diabetic chronic kidney disease: Secondary | ICD-10-CM | POA: Diagnosis not present

## 2022-04-12 DIAGNOSIS — Z794 Long term (current) use of insulin: Secondary | ICD-10-CM | POA: Diagnosis not present

## 2022-04-12 LAB — BAYER DCA HB A1C WAIVED: HB A1C (BAYER DCA - WAIVED): 7.8 % — ABNORMAL HIGH (ref 4.8–5.6)

## 2022-04-13 LAB — LIPID PANEL
Chol/HDL Ratio: 5.3 ratio — ABNORMAL HIGH (ref 0.0–4.4)
Cholesterol, Total: 202 mg/dL — ABNORMAL HIGH (ref 100–199)
HDL: 38 mg/dL — ABNORMAL LOW (ref >39)
LDL Chol Calc (NIH): 119 mg/dL — ABNORMAL HIGH (ref 0–99)
Triglycerides: 254 mg/dL — ABNORMAL HIGH (ref 0–149)
VLDL Cholesterol Cal: 45 mg/dL — ABNORMAL HIGH (ref 5–40)

## 2022-04-13 LAB — VITAMIN D 25 HYDROXY (VIT D DEFICIENCY, FRACTURES): Vit D, 25-Hydroxy: 16.9 ng/mL — ABNORMAL LOW (ref 30.0–100.0)

## 2022-04-13 LAB — VITAMIN B12: Vitamin B-12: 603 pg/mL (ref 232–1245)

## 2022-04-22 ENCOUNTER — Telehealth: Payer: Self-pay | Admitting: Family Medicine

## 2022-04-22 ENCOUNTER — Telehealth: Payer: Self-pay | Admitting: Pharmacist

## 2022-04-22 ENCOUNTER — Ambulatory Visit: Payer: Medicare HMO | Admitting: Pharmacist

## 2022-04-22 VITALS — BP 135/79 | HR 65

## 2022-04-22 DIAGNOSIS — E1165 Type 2 diabetes mellitus with hyperglycemia: Secondary | ICD-10-CM

## 2022-04-22 DIAGNOSIS — E669 Obesity, unspecified: Secondary | ICD-10-CM

## 2022-04-22 DIAGNOSIS — E1169 Type 2 diabetes mellitus with other specified complication: Secondary | ICD-10-CM

## 2022-04-22 MED ORDER — BASAGLAR KWIKPEN 100 UNIT/ML ~~LOC~~ SOPN: 35.0000 [IU] | PEN_INJECTOR | Freq: Every day | SUBCUTANEOUS | 5 refills | Status: DC

## 2022-04-22 MED ORDER — ATORVASTATIN CALCIUM 20 MG PO TABS: 20.0000 mg | ORAL_TABLET | Freq: Every day | ORAL | 3 refills | Status: DC

## 2022-04-22 MED ORDER — INSULIN LISPRO (1 UNIT DIAL) 100 UNIT/ML (KWIKPEN): 10.0000 [IU] | PEN_INJECTOR | SUBCUTANEOUS | 5 refills | Status: DC

## 2022-04-22 MED ORDER — TRULICITY 4.5 MG/0.5ML ~~LOC~~ SOAJ: 4.5000 mg | SUBCUTANEOUS | 1 refills | Status: DC

## 2022-04-22 NOTE — Telephone Encounter (Signed)
  Order for Brooke [name] 3 CGM sent to [facility] via [facility] portal

## 2022-04-22 NOTE — Progress Notes (Signed)
Chronic Care Management Pharmacy Note  04/22/2022 Name:  Brooke [name] MRN:  [id number] DOB:  [birth year]  Summary:  Diabetes: unCONTROLLED A1c 7.8% (SLIGHT INCREASE FROM 7/1%);  much improved over the past year **Important ** -holding jardiance, kerendia per nephro/GFR 20--lisinopril 76m every other day restarted Current treatment: BASAGLAR 4123XX123UNITS, TRULICITY [id number]; Humalog added for meal times (5-10 units with meals) PAST meds: Metformin was discontinued due to GFR 28-->15 Continues to work on controlling her diabetes Denies personal and family history of Medullary thyroid cancer (MTC) Current glucose readings: fasting glucose: <130, 113 today, post prandial glucose: <200 Submitting to DME [facility] for libre 3 Denies hypoglycemic/hyperglycemic symptoms Discussed meal planning options and Plate method for healthy eating Avoid sugary drinks and desserts Incorporate balanced protein, non starchy veggies, 1 serving of carbohydrate with each meal Increase water intake Increase physical activity as able Current exercise: N/A Educated on DIABETES MEDICATIONS, BG READINGS; libre Assessed patient finances.  WILL RE-ENROLL:  [employer] patient assistance for basaglar/trulicity/humalog  HTN -controlled now that lisinopril restarted -patient checking at home/[employer]  HLD -switch to atorva (no renal adjustments and more potent) -Lipid Panel     Component Value Date/Time   CHOL 202 (H) 04/12/2022 0930   TRIG 254 (H) 04/12/2022 0930   HDL 38 (L) 04/12/2022 0930   CHOLHDL 5.3 (H) 04/12/2022 0930   LDLCALC 119 (H) 04/12/2022 0930   LABVLDL 45 (H) 04/12/2022 0930     Patient Goals/Self-Care Activities Over the next 90 days, patient will:  - take medications as prescribed check glucose DAILY, document, and provide at future appointments  Follow Up Plan: Telephone follow up appointment with care management team member scheduled for: 3 months   Subjective: Brooke Krolickis an 67y.o. year old female who is a primary patient of Brooke [name].  The patient was referred to the Chronic Care Management team for assistance with care management needs subsequent to provider initiation of CCM services and plan of care.    Engaged with patient by telephone for follow up visit in response to provider referral for CCM services.   Objective:  LABS:    Lab Results  Component Value Date   CREATININE 3.22 (HH) 12/27/2021   CREATININE 2.86 (H) 09/17/2021   CREATININE 2.90 (H) 08/06/2021     Lab Results  Component Value Date   HGBA1C 7.8 (H) 04/12/2022         Component Value Date/Time   CHOL 202 (H) 04/12/2022 0930   TRIG 254 (H) 04/12/2022 0930   HDL 38 (L) 04/12/2022 0930   CHOLHDL 5.3 (H) 04/12/2022 0930   LDLCALC 119 (H) 04/12/2022 0930     Clinical ASCVD: No   The 10-year ASCVD risk score (Arnett DK, et al., 2019) is: 26.9%   Values used to calculate the score:     Age: 67years     Sex: Female     Is Non-Hispanic African American: Yes     Diabetic: Yes     Tobacco smoker: No     Systolic Blood Pressure: [id number]     Is BP treated: Yes     HDL Cholesterol: 38 mg/dL     Total Cholesterol: 202 mg/dL    Other: (CHADS2VASc if Afib, PHQ9 if depression, MMRC or CAT for COPD, ACT, DEXA)    BP Readings from Last 3 Encounters:  04/22/22 135/79  01/26/22 (!) 151/71  01/21/22 130/75      SDOH:  (Social Determinants of  Health) assessments and interventions performed:    Allergies  Allergen Reactions   Jardiance [Empagliflozin]     Holding per nephro   Carrington Clamp [Finerenone]     Holding per nephro    Medications Reviewed Today     Reviewed by Brooke [name], [facility] (Pharmacist) on 04/22/22 at 1329  Med List Status: <None>   Medication Order Taking? Sig Documenting Provider Last Dose Status Informant  acetaminophen (TYLENOL) 500 MG tablet [id number]  Take 1,000 mg by mouth every 8 (eight) hours as needed for moderate pain. [provider]  Active Self  albuterol (VENTOLIN HFA) 108 (90 Base) MCG/ACT inhaler [id number]  Inhale 2 puffs into the lungs every 6 (six) hours as needed for wheezing or shortness of breath. Brooke Brooklyn, [name]  Active Self  amLODipine (NORVASC) 10 MG tablet [id number]  Take 1 tablet (10 mg total) by mouth daily. Brooke Lynn, NP  Active   atenolol (TENORMIN) 100 MG tablet [id number]  Take 1 tablet (100 mg total) by mouth every morning. Brooke Lynn, NP  Active   budesonide-formoterol [facility]) 160-4.5 MCG/ACT inhaler [id number]  Inhale 2 puffs into the lungs 2 (two) times daily. Brooke Brooklyn, [name]  Active Self           Med Note Brooke [name], Brooke [name]   Tue Sep 28, 2021  1:26 PM) Via [place] patient [place]   calcium acetate (PHOSLO) 667 MG tablet [id number]  Take by mouth. [provider]  Active   Camphor-Menthol-Methyl Sal (SALONPAS) 3.03-12-08 % PTCH [id number]  Apply 1 patch topically daily as needed (pain). [provider]  Active Self  Cholecalciferol 25 MCG (1000 UT) capsule [id number]  Take 1,000 Units by mouth daily. [provider]  Active Self  docusate sodium (COLACE) 100 MG capsule [id number]  Take 1 capsule (100 mg total) by mouth 2 (two) times daily. Brooke Alar, PA-C  Active   Dulaglutide (TRULICITY) 4.5 0000000 SOPN [id number]  Inject 4.5 mg as directed once a week.  Patient taking differently: Inject 4.5 mg as directed every Sunday.   Brooke Brooklyn, [name]  Active Self           Med Note Brooke [name], Karcyn Menn D   Wed Jun 09, 2021 10:30 AM) Via [employer] patient assistance program    hydrochlorothiazide (HYDRODIURIL) 25 MG tablet [id number]  Take 1 tablet (25 mg total) by mouth daily. Brooke Lynn, NP  Active   Insulin Glargine [facility] KWIKPEN) 100 UNIT/ML [id number]  Inject 35 Units into the skin at bedtime. [provider]  Active Self           Med Note Brooke [name], Sherian Maroon Dec 31, 2020 12:05 PM) Via [employer] patient  assistance program  insulin lispro (HUMALOG KWIKPEN) 100 UNIT/ML KwikPen [id number]  Inject 20-30 Units into the skin with breakfast, with lunch, and with evening meal.  Patient taking differently: Inject 10-15 Units into the skin See admin instructions. 10 units twice daily, 15 units at bedtime   [name] F, [name]  Active Self           Med Note ([place] Aug 04, 2021  6:30 AM) Took 5 units this morning at 0415  Insulin Pen Needle (RELION PEN NEEDLES) 31G X 6 MM MISC [id number]  USE WITH INSULIN DAILY Brooke Lynn, NP  Active   Menthol, Topical Analgesic, (BIOFREEZE) 4 % GEL [id number]  Apply 1 application. topically daily as needed (  pain). [provider]  Active Self  sodium bicarbonate 650 MG tablet [id number]  Take by mouth. [provider]  Active   Med List Note Brooke [name], [facility] [id number]): trulicity, basaglar, humalog escribe to [facility] mail order symbicort-escribe to [facility] mail order jardiance-escribe to pharmacord mail order               Goals Addressed               This Visit's Progress     Patient Stated     T2DM PharmD goal (pt-stated)        Current Barriers:  Unable to independently afford treatment regimen Unable to achieve control of T2DM, CKD   Pharmacist Clinical Goal(s):  Over the next 90 days, patient will verbalize ability to afford treatment regimen maintain control of T2DM as evidenced by GOAL A1C<7%  through collaboration with PharmD and provider.    Interventions: 1:1 collaboration with Brooke Brooklyn, [name] regarding development and update of comprehensive plan of care as evidenced by provider attestation and co-signature Inter-disciplinary care team collaboration (see longitudinal plan of care) Comprehensive medication review performed; medication list updated in electronic medical record  Diabetes: CONTROLLED A1c 10.9%--> 7.1%;  much improved over the past year **Important ** -holding  jardiance, kerendia per nephro/GFR 20--lisinopril 41m every other day restarted Current treatment: BASAGLAR 4123XX123UNITS, TRULICITY [id number]; Humalog added for meal times (5-10 units with meals) PAST meds: Metformin was discontinued due to GFR 28-->15 Continues to work on controlling her diabetes Denies personal and family history of Medullary thyroid cancer (MTC) Current glucose readings: fasting glucose: <130, 113 today, post prandial glucose: <200 Submitting to DME [facility] for libre 3 Denies hypoglycemic/hyperglycemic symptoms Discussed meal planning options and Plate method for healthy eating Avoid sugary drinks and desserts Incorporate balanced protein, non starchy veggies, 1 serving of carbohydrate with each meal Increase water intake Increase physical activity as able Current exercise: N/A Educated on DIABETES MEDICATIONS, BG READINGS; lCenterpatient finances.  WILL RE-ENROLL:  [employer] patient assistance for basaglar/trulicity/humalog  HTN -controlled now that lisinopril restarted -patient checking at home/[employer]  HLD -switch to atorva (no renal adjustments and more potent) -Lipid Panel     Component Value Date/Time   CHOL 202 (H) 04/12/2022 0930   TRIG 254 (H) 04/12/2022 0930   HDL 38 (L) 04/12/2022 0930   CHOLHDL 5.3 (H) 04/12/2022 0930   LDLCALC 119 (H) 04/12/2022 0930   LABVLDL 45 (H) 04/12/2022 0930    Patient Goals/Self-Care Activities Over the next 90 days, patient will:  - take medications as prescribed check glucose DAILY, document, and provide at future appointments  Follow Up Plan: Telephone follow up appointment with care management team member scheduled for: 3 months         Plan: Telephone follow up appointment with care management team member scheduled for:  6 WEEKS     JRegina Eck PharmD, BCPS, BChase CrossingClinical Pharmacist, [place]  T [phone]

## 2022-04-22 NOTE — Telephone Encounter (Signed)
Contacted [name] to schedule their annual wellness visit. Appointment made for 04/25/2022.  Thank you,  [place] ??[phone]

## 2022-04-25 ENCOUNTER — Ambulatory Visit (INDEPENDENT_AMBULATORY_CARE_PROVIDER_SITE_OTHER): Payer: Medicare HMO

## 2022-04-25 VITALS — Ht 65.0 in | Wt 208.0 lb

## 2022-04-25 DIAGNOSIS — Z0000 Encounter for general adult medical examination without abnormal findings: Secondary | ICD-10-CM

## 2022-04-25 NOTE — Patient Instructions (Signed)
Brooke [name] , Thank you for taking time to come for your Medicare Wellness Visit. I appreciate your ongoing commitment to your health goals. Please review the following plan we discussed and let me know if I can assist you in the future.   These are the goals we discussed:  Goals       CCM (CHRONIC KIDNEY DISEASE) EXPECTED OUTCOME: MONITOR, SELF-MANAGE AND REDUCE SYMPTOMS OF CHRONIC KIDNEY DISEASE      Current Barriers:  Knowledge Deficits related to CKD Chronic Disease Management support and education needs related to CKD No Advanced Directives in place- Pt declines Pt reports she is concerned about her kidneys and trying to keep blood sugar, blood pressure WNL to preserve kidney function, does not drink sugary drinks Pt states she is working on her diet  Planned Interventions: Assessed the patient     understanding of chronic kidney disease    Evaluation of current treatment plan related to chronic kidney disease self management and patient's adherence to plan as established by provider      Reviewed prescribed diet low sodium, being mindful of phosphorus intake Reviewed medications with patient and discussed importance of compliance    Counseled on the importance of exercise goals with target of 150 minutes per week     Advised patient, providing education and rationale, to monitor blood pressure daily and record, calling PCP for findings outside established parameters    Discussed complications of poorly controlled blood pressure such as heart disease, stroke, circulatory complications, vision complications, kidney impairment, sexual dysfunction    Reviewed importance of staying active  Symptom Management: Take medications as prescribed   Attend all scheduled provider appointments Call pharmacy for medication refills 3-7 days in advance of running out of medications Attend church or other social activities Perform all self care activities independently  Perform IADL's (shopping,  preparing meals, housekeeping, managing finances) independently Call provider office for new concerns or questions  Try to do some type of exercise daily- walking is good  Follow Up Plan: Telephone follow up appointment with care management team member scheduled for:  06/1822 at 130 pm        CCM (DIABETES) EXPECTED OUTCOME:  MONITOR, SELF-MANAGE AND REDUCE SYMPTOMS OF DIABETES      Current Barriers:  Knowledge Deficits related to Diabetes management Chronic Disease Management support and education needs related to Diabetes Financial Constraints.  No Advanced Directives in place- pt declines Pt reports she checks CBG BID with fasting ranges 99- 132, random ranges 170-180 Pt reports she tried to eat healthy and be mindful of carbohydrate intake Pt reports care guide did outreach her with food resources, pt continues utilizing [address] for hot meals Wed- Sat and groceries, fresh produce given  Planned Interventions: Reviewed medications with patient and discussed importance of medication adherence;        Reviewed prescribed diet with patient carbohydrate modified; Counseled on importance of regular laboratory monitoring as prescribed;        Advised patient, providing education and rationale, to check cbg twice daily and record        call provider for findings outside established parameters;       Review of patient status, including review of consultants reports, relevant laboratory and other test results, and medications completed;        Symptom Management: Take medications as prescribed   Attend all scheduled provider appointments Call pharmacy for medication refills 3-7 days in advance of running out of medications Attend church or  other social activities Perform all self care activities independently  Perform IADL's (shopping, preparing meals, housekeeping, managing finances) independently Call provider office for new concerns or questions  check blood sugar at prescribed times:  twice daily check feet daily for cuts, sores or redness enter blood sugar readings and medication or insulin into daily log take the blood sugar log to all doctor visits take the blood sugar meter to all doctor visits trim toenails straight across fill half of plate with vegetables limit fast food meals to no more than 1 per week manage portion size prepare main meal at home 3 to 5 days each week read food labels for fat, fiber, carbohydrates and portion size do heel pump exercise 2 to 3 times each day keep feet up while sitting wash and dry feet carefully every day  Follow Up Plan: Telephone follow up appointment with care management team member scheduled for:  06/23/22 at 130 pm        CCM (HYPERTENSION) EXPECTED OUTCOME: MONITOR, SELF-MANAGE AND REDUCE SYMPTOMS OF HYPERTENSION      Current Barriers:  Knowledge Deficits related to Hypertension management Chronic Disease Management support and education needs related to Hypertension No Advanced Directives in place- pt declines Pt reports she lives with spouse, is independent in all aspects of her care, does not exercise, tries to adhere to healthy diet Pt reports she checks blood pressure on occasion and readings have been good  Planned Interventions: Evaluation of current treatment plan related to hypertension self management and patient's adherence to plan as established by provider;   Reviewed prescribed diet low sodium Reviewed medications with patient and discussed importance of compliance;  Counseled on the importance of exercise goals with target of 150 minutes per week Discussed plans with patient for ongoing care management follow up and provided patient with direct contact information for care management team; Advised patient, providing education and rationale, to monitor blood pressure daily and record, calling PCP for findings outside established parameters;  Discussed complications of poorly controlled blood pressure  such as heart disease, stroke, circulatory complications, vision complications, kidney impairment, sexual dysfunction;  Screening for signs and symptoms of depression related to chronic disease state;  Assessed social determinant of health barriers;  Reviewed upcoming scheduled appointments - pharmacist 04/22/22 telephone call  Symptom Management: Take medications as prescribed   Attend all scheduled provider appointments Call pharmacy for medication refills 3-7 days in advance of running out of medications Attend church or other social activities Perform all self care activities independently  Perform IADL's (shopping, preparing meals, housekeeping, managing finances) independently Call provider office for new concerns or questions  check blood pressure weekly choose a place to take my blood pressure (home, clinic or office, retail store) write blood pressure results in a log or diary learn about high blood pressure keep a blood pressure log take blood pressure log to all doctor appointments keep all doctor appointments take medications for blood pressure exactly as prescribed begin an exercise program report new symptoms to your doctor eat more whole grains, fruits and vegetables, lean meats and healthy fats Follow low sodium diet Pharmacist to call on 04/22/22  Follow Up Plan: Telephone follow up appointment with care management team member scheduled for: 06/23/22 at 130 pm        Exercise 3x per week (30 min per time)      Increase as tolerated.       T2DM PharmD goal (pt-stated)      Current Barriers:  Unable  to independently afford treatment regimen Unable to achieve control of T2DM, CKD   Pharmacist Clinical Goal(s):  Over the next 90 days, patient will verbalize ability to afford treatment regimen maintain control of T2DM as evidenced by GOAL A1C<7%  through collaboration with PharmD and provider.    Interventions: 1:1 collaboration with [name], FNP  regarding development and update of comprehensive plan of care as evidenced by provider attestation and co-signature Inter-disciplinary care team collaboration (see longitudinal plan of care) Comprehensive medication review performed; medication list updated in electronic medical record  Diabetes: CONTROLLED A1c 10.9%--> 7.1%;  much improved over the past year **Important ** -holding jardiance, kerendia per nephro/GFR 20--lisinopril 40m every other day restarted Current treatment: BASAGLAR [id number], TRULICITY [id number]; Humalog added for meal times (5-10 units with meals) PAST meds: Metformin was discontinued due to GFR 28-->15 Continues to work on controlling her diabetes Denies personal and family history of Medullary thyroid cancer (MTC) Current glucose readings: fasting glucose: <130, 113 today, post prandial glucose: <200 Submitting to DME [facility] for libre 3 Denies hypoglycemic/hyperglycemic symptoms Discussed meal planning options and Plate method for healthy eating Avoid sugary drinks and desserts Incorporate balanced protein, non starchy veggies, 1 serving of carbohydrate with each meal Increase water intake Increase physical activity as able Current exercise: N/A Educated on DIABETES MEDICATIONS, BG READINGS; [place].  WILL RE-ENROLL:  [facility] patient assistance for basaglar/trulicity/humalog  HTN -controlled now that lisinopril restarted -patient checking at home/[employer]  HLD -switch to atorva (no renal adjustments and more potent) -Lipid Panel     Component Value Date/Time   CHOL 202 (H) 04/12/2022 0930   TRIG 254 (H) 04/12/2022 0930   HDL 38 (L) 04/12/2022 0930   CHOLHDL 5.3 (H) 04/12/2022 0930   LDLCALC 119 (H) 04/12/2022 0930   LABVLDL 45 (H) 04/12/2022 0930    Patient Goals/Self-Care Activities Over the next 90 days, patient will:  - take medications as prescribed check glucose DAILY, document, and provide at future  appointments  Follow Up Plan: Telephone follow up appointment with care management team member scheduled for: 3 months         This is a list of the screening recommended for you and due dates:  Health Maintenance  Topic Date Due   Pneumonia Vaccine (1 of 2 - PCV) Never done   DTaP/Tdap/Td vaccine (1 - Tdap) Never done   Zoster (Shingles) Vaccine (1 of 2) Never done   COVID-19 Vaccine (4 - 2023-24 season) 11/05/2021   Eye exam for diabetics  04/13/2022   Yearly kidney health urinalysis for diabetes  05/13/2022   Hepatitis C Screening: USPSTF Recommendation to screen - Ages 18-79 yo.  05/13/2022*   Flu Shot  06/05/2022*   Mammogram  12/28/2022*   Complete foot exam   05/13/2022   Hemoglobin A1C  10/11/2022   Yearly kidney function blood test for diabetes  12/28/2022   Medicare Annual Wellness Visit  04/26/2023   Cologuard (Stool DNA test)  11/23/2023   DEXA scan (bone density measurement)  Completed   HPV Vaccine  Aged Out  *Topic was postponed. The date shown is not the original due date.    Advanced directives: Advance directive discussed with you today. I have provided a copy for you to complete at home and have notarized. Once this is complete please bring a copy in to our office so we can scan it into your chart.   Conditions/risks identified: Aim for 30 minutes of exercise  or brisk walking, 6-8 glasses of water, and 5 servings of fruits and vegetables each day.   Next appointment: Follow up in one year for your annual wellness visit    Preventive Care 65 Years and Older, Female Preventive care refers to lifestyle choices and visits with your health care provider that can promote health and wellness. What does preventive care include? A yearly physical exam. This is also called an annual well check. Dental exams once or twice a year. Routine eye exams. Ask your health care provider how often you should have your eyes checked. Personal lifestyle choices,  including: Daily care of your teeth and gums. Regular physical activity. Eating a healthy diet. Avoiding tobacco and drug use. Limiting alcohol use. Practicing safe sex. Taking low-dose aspirin every day. Taking vitamin and mineral supplements as recommended by your health care provider. What happens during an annual well check? The services and screenings done by your health care provider during your annual well check will depend on your age, overall health, lifestyle risk factors, and family history of disease. Counseling  Your health care provider may ask you questions about your: Alcohol use. Tobacco use. Drug use. Emotional well-being. Home and relationship well-being. Sexual activity. Eating habits. History of falls. Memory and ability to understand (cognition). Work and work Statistician. Reproductive health. Screening  You may have the following tests or measurements: Height, weight, and BMI. Blood pressure. Lipid and cholesterol levels. These may be checked every 5 years, or more frequently if you are over 26 years old. Skin check. Lung cancer screening. You may have this screening every year starting at age 3 if you have a 30-pack-year history of smoking and currently smoke or have quit within the past 15 years. Fecal occult blood test (FOBT) of the stool. You may have this test every year starting at age 54. Flexible sigmoidoscopy or colonoscopy. You may have a sigmoidoscopy every 5 years or a colonoscopy every 10 years starting at age 74. Hepatitis C blood test. Hepatitis B blood test. Sexually transmitted disease (STD) testing. Diabetes screening. This is done by checking your blood sugar (glucose) after you have not eaten for a while (fasting). You may have this done every 1-3 years. Bone density scan. This is done to screen for osteoporosis. You may have this done starting at age 33. Mammogram. This may be done every 1-2 years. Talk to your health care provider  about how often you should have regular mammograms. Talk with your health care provider about your test results, treatment options, and if necessary, the need for more tests. Vaccines  Your health care provider may recommend certain vaccines, such as: Influenza vaccine. This is recommended every year. Tetanus, diphtheria, and acellular pertussis (Tdap, Td) vaccine. You may need a Td booster every 10 years. Zoster vaccine. You may need this after age 87. Pneumococcal 13-valent conjugate (PCV13) vaccine. One dose is recommended after age 12. Pneumococcal polysaccharide (PPSV23) vaccine. One dose is recommended after age 68. Talk to your health care provider about which screenings and vaccines you need and how often you need them. This information is not intended to replace advice given to you by your health care provider. Make sure you discuss any questions you have with your health care provider. Document Released: 03/20/2015 Document Revised: 11/11/2015 Document Reviewed: 12/23/2014 Elsevier Interactive Patient Education  2017 [place] Prevention in the Home Falls can cause injuries. They can happen to people of all ages. There are many things you can do  to make your home safe and to help prevent falls. What can I do on the outside of my home? Regularly fix the edges of walkways and driveways and fix any cracks. Remove anything that might make you trip as you walk through a door, such as a raised step or threshold. Trim any bushes or trees on the path to your home. Use bright outdoor lighting. Clear any walking paths of anything that might make someone trip, such as rocks or tools. Regularly check to see if handrails are loose or broken. Make sure that both sides of any steps have handrails. Any raised decks and porches should have guardrails on the edges. Have any leaves, snow, or ice cleared regularly. Use sand or salt on walking paths during winter. Clean up any spills in  your garage right away. This includes oil or grease spills. What can I do in the bathroom? Use night lights. Install grab bars by the toilet and in the tub and shower. Do not use towel bars as grab bars. Use non-skid mats or decals in the tub or shower. If you need to sit down in the shower, use a plastic, non-slip stool. Keep the floor dry. Clean up any water that spills on the floor as soon as it happens. Remove soap buildup in the tub or shower regularly. Attach bath mats securely with double-sided non-slip rug tape. Do not have throw rugs and other things on the floor that can make you trip. What can I do in the bedroom? Use night lights. Make sure that you have a light by your bed that is easy to reach. Do not use any sheets or blankets that are too big for your bed. They should not hang down onto the floor. Have a firm chair that has side arms. You can use this for support while you get dressed. Do not have throw rugs and other things on the floor that can make you trip. What can I do in the kitchen? Clean up any spills right away. Avoid walking on wet floors. Keep items that you use a lot in easy-to-reach places. If you need to reach something above you, use a strong step stool that has a grab bar. Keep electrical cords out of the way. Do not use floor polish or wax that makes floors slippery. If you must use wax, use non-skid floor wax. Do not have throw rugs and other things on the floor that can make you trip. What can I do with my stairs? Do not leave any items on the stairs. Make sure that there are handrails on both sides of the stairs and use them. Fix handrails that are broken or loose. Make sure that handrails are as long as the stairways. Check any carpeting to make sure that it is firmly attached to the stairs. Fix any carpet that is loose or worn. Avoid having throw rugs at the top or bottom of the stairs. If you do have throw rugs, attach them to the floor with carpet  tape. Make sure that you have a light switch at the top of the stairs and the bottom of the stairs. If you do not have them, ask someone to add them for you. What else can I do to help prevent falls? Wear shoes that: Do not have high heels. Have rubber bottoms. Are comfortable and fit you well. Are closed at the toe. Do not wear sandals. If you use a stepladder: Make sure that it is fully opened. Do  not climb a closed stepladder. Make sure that both sides of the stepladder are locked into place. Ask someone to hold it for you, if possible. Clearly mark and make sure that you can see: Any grab bars or handrails. First and last steps. Where the edge of each step is. Use tools that help you move around (mobility aids) if they are needed. These include: Canes. Walkers. Scooters. Crutches. Turn on the lights when you go into a dark area. Replace any light bulbs as soon as they burn out. Set up your furniture so you have a clear path. Avoid moving your furniture around. If any of your floors are uneven, fix them. If there are any pets around you, be aware of where they are. Review your medicines with your doctor. Some medicines can make you feel dizzy. This can increase your chance of falling. Ask your doctor what other things that you can do to help prevent falls. This information is not intended to replace advice given to you by your health care provider. Make sure you discuss any questions you have with your health care provider. Document Released: 12/18/2008 Document Revised: 07/30/2015 Document Reviewed: 03/28/2014 Elsevier Interactive Patient Education  2017 [employer].

## 2022-04-25 NOTE — Progress Notes (Signed)
Subjective:   Brooke [name] is a 67 y.o. female who presents for Medicare Annual (Subsequent) preventive examination. I connected with  [name] on 04/25/22 by a audio enabled telemedicine application and verified that I am speaking with the correct person using two identifiers.  Patient Location: Home  Provider Location: Home Office  I discussed the limitations of evaluation and management by telemedicine. The patient expressed understanding and agreed to proceed.  Review of Systems     Cardiac Risk Factors include: advanced age (>49mn, >>87women);hypertension;dyslipidemia;diabetes mellitus     Objective:    Today's Vitals   04/25/22 1200  Weight: 208 lb (94.3 kg)  Height: 5' 5"$  (1.651 m)   Body mass index is 34.61 kg/m.     04/25/2022   12:05 PM 02/10/2022   12:45 PM 08/04/2021    2:00 PM 07/22/2021    4:28 PM 07/20/2021    1:00 PM 02/01/2021    2:52 PM 05/16/2020    3:33 PM  Advanced Directives  Does Patient Have a Medical Advance Directive? No No No No No No No  Would patient like information on creating a medical advance directive? No - Patient declined No - Patient declined No - Patient declined  No - Patient declined No - Patient declined No - Patient declined    Current Medications (verified) Outpatient Encounter Medications as of 04/25/2022  Medication Sig   acetaminophen (TYLENOL) 500 MG tablet Take 1,000 mg by mouth every 8 (eight) hours as needed for moderate pain.   albuterol (VENTOLIN HFA) 108 (90 Base) MCG/ACT inhaler Inhale 2 puffs into the lungs every 6 (six) hours as needed for wheezing or shortness of breath.   amLODipine (NORVASC) 10 MG tablet Take 1 tablet (10 mg total) by mouth daily.   atenolol (TENORMIN) 100 MG tablet Take 1 tablet (100 mg total) by mouth every morning.   atorvastatin (LIPITOR) 20 MG tablet Take 1 tablet (20 mg total) by mouth daily.   budesonide-formoterol (SYMBICORT) 160-4.5 MCG/ACT inhaler Inhale 2 puffs into the lungs  2 (two) times daily.   calcium acetate (PHOSLO) 667 MG tablet Take by mouth.   Camphor-Menthol-Methyl Sal (SALONPAS) 3.03-12-08 % PTCH Apply 1 patch topically daily as needed (pain).   Cholecalciferol 25 MCG (1000 UT) capsule Take 1,000 Units by mouth daily.   docusate sodium (COLACE) 100 MG capsule Take 1 capsule (100 mg total) by mouth 2 (two) times daily.   Dulaglutide (TRULICITY) 4.5 M0000000SOPN Inject 4.5 mg as directed once a week.   ergocalciferol (VITAMIN D2) 1.25 MG (50000 UT) capsule Take 50,000 Units by mouth once a week.   hydrochlorothiazide (HYDRODIURIL) 25 MG tablet Take 1 tablet (25 mg total) by mouth daily.   Insulin Glargine (BASAGLAR KWIKPEN) 100 UNIT/ML Inject 35 Units into the skin at bedtime.   insulin lispro (HUMALOG KWIKPEN) 100 UNIT/ML KwikPen Inject 10-15 Units into the skin See admin instructions. 10 units WITH BREAKFAST AND LUNCH, [facility]  Insulin Pen Needle (RELION PEN NEEDLES) 31G X 6 MM MISC USE WITH INSULIN DAILY   lisinopril (ZESTRIL) 5 MG tablet Take 5 mg by mouth daily.   Menthol, Topical Analgesic, (BIOFREEZE) 4 % GEL Apply 1 application. topically daily as needed (pain).   sodium bicarbonate 650 MG tablet Take by mouth.   No facility-administered encounter medications on file as of 04/25/2022.    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   Hypertension    Hyperuricemia 11/27/2020   Obesity (BMI 35.0-39.9 without comorbidity)    Renal mass 08/04/2021   Vertigo    Vitamin D insufficiency 11/27/2020   Past Surgical History:  Procedure Laterality Date   BREAST SURGERY Right  1974   OPERATIVE ULTRASOUND N/A 08/04/2021   Procedure: OPERATIVE ULTRASOUND;  Surgeon: Alexis Frock, MD;  Location: [facility];  Service: Urology;  Laterality: N/A;   ROBOTIC ASSITED PARTIAL NEPHRECTOMY Right 08/04/2021   Procedure: XI ROBOTIC ASSITED RADICAL NEPHRECTOMY;  Surgeon: Alexis Frock, MD;  Location: [facility];  Service: Urology;  Laterality: Right;   Family History  Problem Relation Age of Onset   Diabetes Mother    Congestive Heart Failure Mother    Kidney disease Father    Diabetes Sister    Diabetes Brother    Hypertension Daughter    Hypertension Sister    Cervical cancer Sister    Social History   Socioeconomic History   Marital status: Married    Spouse name: Donnie   Number of children: 1   Years of education: Not on file   Highest education level: Not on file  Occupational History   Not on file  Tobacco Use   Smoking status: Never   Smokeless tobacco: Never  Vaping Use   Vaping Use: Never used  Substance and Sexual Activity   Alcohol use: No   Drug use: No   Sexual activity: Never  Other Topics Concern   Not on file  Social History Narrative   1 daughter    1 grandson   Social Determinants of Health   Financial Resource 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  (04/25/2022)   Overall Financial Resource Strain (CARDIA)    Difficulty of Paying Living Expenses: Not hard at all  Recent Concern: Financial Resource Strain - Medium Risk (02/14/2022)   Overall Financial Resource Strain (CARDIA)    Difficulty of Paying Living Expenses: Somewhat hard  Food Insecurity: No Food Insecurity (04/25/2022)   Hunger Vital Sign    Worried About Running Out of Food in the Last Year: Never true    [place] in the Last Year: Never true  Recent Concern: Food Insecurity - Food Insecurity Present (02/14/2022)   Hunger Vital Sign    Worried About Running Out of Food in the Last Year: Often true    Ran Out of Food in the Last Year: Often true  Transportation Needs: No Transportation  Needs (04/25/2022)   PRAPARE - Hydrologist (Medical): No    Lack of Transportation (Non-Medical): No  Physical Activity: Inactive (04/25/2022)   Exercise Vital Sign    Days of Exercise per Week: 0 days    Minutes of Exercise per Session: 0 min  Stress: No Stress Concern Present (04/25/2022)   [place]    Feeling of Stress : Not at all  Social Connections: Moderately Integrated (04/25/2022)   Social Connection and Isolation Panel [NHANES]    Frequency of Communication with Friends and Family: More than three times a week    Frequency of Social Gatherings with Friends and Family: More than three times  a week    Attends Religious Services: More than 4 times per year    Active Member of Clubs or Organizations: No    Attends Archivist Meetings: Never    Marital Status: Married    Tobacco Counseling Counseling given: Not Answered   Clinical Intake:  Pre-visit preparation completed: Yes  Pain : No/denies pain     Nutritional Risks: None Diabetes: Yes CBG done?: No Did pt. bring in CBG monitor from home?: No  How often do you need to have someone help you when you read instructions, pamphlets, or other written materials from your doctor or pharmacy?: 1 - Never  Diabetic?yes Nutrition Risk Assessment:  Has the patient had any N/V/D within the last 2 months?  No  Does the patient have any non-healing wounds?  No  Has the patient had any unintentional weight loss or weight gain?  No   Diabetes:  Is the patient diabetic?  Yes  If diabetic, was a CBG obtained today?  No  Did the patient bring in their glucometer from home?  No  How often do you monitor your CBG's? 2 x day .   Financial Strains and Diabetes Management:  Are you having any financial strains with the device, your supplies or your medication? No .  Does the patient want to be seen by Chronic Care Management  for management of their diabetes?  No  Would the patient like to be referred to a Nutritionist or for Diabetic Management?  No   Diabetic Exams:  Diabetic Eye Exam: Completed 05/2021 Diabetic Foot Exam: Overdue, Pt has been advised about the importance in completing this exam. Pt is scheduled for diabetic foot exam on next office visit .   Interpreter Needed?: No  Information entered by :: Jadene Pierini, LPN   Activities of Daily Living    04/25/2022   12:05 PM 08/04/2021    2:00 PM  In your present state of health, do you have any difficulty performing the following activities:  Hearing? 0 0  Vision? 0 0  Difficulty concentrating or making decisions? 0 0  Walking or climbing stairs? 0 0  Dressing or bathing? 0 0  Doing errands, shopping? 0 0  Preparing Food and eating ? N   Using the Toilet? N   In the past six months, have you accidently leaked urine? N   Do you have problems with loss of bowel control? N   Managing your Medications? N   Managing your Finances? N   Housekeeping or managing your Housekeeping? N     Patient Care Team: Baruch Gouty, FNP as PCP - General (Family Medicine) Lavera Guise, [facility] (Pharmacist) Celestia Khat, OD (Optometry)  Indicate any recent Medical Services you may have received from other than [facility] providers in the past year (date may be approximate).     Assessment:   This is a routine wellness examination for [place].  Hearing/Vision screen Hearing Screening - Comments:: Wears rx glasses - up to date with routine eye exams with  Dr.Johnson   Dietary issues and exercise activities discussed: Current Exercise Habits: The patient does not participate in regular exercise at present, Exercise limited by: orthopedic condition(s)   Goals Addressed   None    Depression Screen    04/25/2022   12:04 PM 02/10/2022   12:51 PM 02/10/2022   12:40 PM 01/26/2022    2:05 PM 12/27/2021   11:27 AM 09/17/2021    9:02 AM 07/06/2021   11:28  AM   PHQ 2/9 Scores  PHQ - 2 Score 0 0 0 0 0 0 0  PHQ- 9 Score    0 0 0 0    Fall Risk    04/25/2022   12:02 PM 01/26/2022    2:04 PM 12/27/2021   11:46 AM 12/27/2021   11:24 AM 09/17/2021    9:01 AM  Fall Risk   Falls in the past year? 0 0 0 0 1  Number falls in past yr: 0 0 0  0  Injury with Fall? 0 0 0  1  Risk for fall due to : No Fall Risks No Fall Risks     Follow up Falls prevention discussed Falls evaluation completed Falls evaluation completed  Falls prevention discussed    FALL RISK PREVENTION PERTAINING TO THE HOME:  Any stairs in or around the home? No  If so, are there any without handrails? No  Home free of loose throw rugs in walkways, pet beds, electrical cords, etc? Yes  Adequate lighting in your home to reduce risk of falls? Yes   ASSISTIVE DEVICES UTILIZED TO PREVENT FALLS:  Life alert? No  Use of a cane, walker or w/c? No  Grab bars in the bathroom? No  Shower chair or bench in shower? No  Elevated toilet seat or a handicapped toilet? No        04/25/2022   12:05 PM 02/01/2021    2:55 PM 01/13/2020   10:42 AM  6CIT Screen  What Year? 0 points 0 points 0 points  What month? 0 points 0 points 0 points  What time? 0 points 0 points 0 points  Count back from 20 0 points 0 points 0 points  Months in reverse 0 points 0 points 0 points  Repeat phrase 0 points 0 points 0 points  Total Score 0 points 0 points 0 points    Immunizations Immunization History  Administered Date(s) Administered   Fluad Quad(high Dose 65+) 03/13/2019, 11/28/2019, 12/10/2020   PFIZER(Purple Top)SARS-COV-2 Vaccination 04/12/2019, 05/03/2019, 01/14/2020    TDAP status: Due, Education has been provided regarding the importance of this vaccine. Advised may receive this vaccine at local pharmacy or Health Dept. Aware to provide a copy of the vaccination record if obtained from local pharmacy or Health Dept. Verbalized acceptance and understanding.  Flu Vaccine status: Declined,  Education has been provided regarding the importance of this vaccine but patient still declined. Advised may receive this vaccine at local pharmacy or Health Dept. Aware to provide a copy of the vaccination record if obtained from local pharmacy or Health Dept. Verbalized acceptance and understanding.  Pneumococcal vaccine status: Due, Education has been provided regarding the importance of this vaccine. Advised may receive this vaccine at local pharmacy or Health Dept. Aware to provide a copy of the vaccination record if obtained from local pharmacy or Health Dept. Verbalized acceptance and understanding.  Covid-19 vaccine status: Completed vaccines  Qualifies for Shingles Vaccine? Yes   Zostavax completed No   Shingrix Completed?: No.    Education has been provided regarding the importance of this vaccine. Patient has been advised to call insurance company to determine out of pocket expense if they have not yet received this vaccine. Advised may also receive vaccine at local pharmacy or Health Dept. Verbalized acceptance and understanding.  Screening Tests Health Maintenance  Topic Date Due   Pneumonia Vaccine 31+ Years old (1 of 2 - PCV) Never done   DTaP/Tdap/Td (1 - Tdap) Never done  Zoster Vaccines- Shingrix (1 of 2) Never done   COVID-19 Vaccine (4 - 2023-24 season) 11/05/2021   OPHTHALMOLOGY EXAM  04/13/2022   Diabetic kidney evaluation - Urine ACR  05/13/2022   Hepatitis C Screening  05/13/2022 (Originally 03/03/1974)   INFLUENZA VACCINE  06/05/2022 (Originally 10/05/2021)   MAMMOGRAM  12/28/2022 (Originally 03/03/2004)   FOOT EXAM  05/13/2022   HEMOGLOBIN A1C  10/11/2022   Diabetic kidney evaluation - eGFR measurement  12/28/2022   Medicare Annual Wellness (AWV)  04/26/2023   Fecal DNA (Cologuard)  11/23/2023   DEXA SCAN  Completed   HPV VACCINES  Aged Out    Health Maintenance  Health Maintenance Due  Topic Date Due   Pneumonia Vaccine 21+ Years old (1 of 2 - PCV) Never  done   DTaP/Tdap/Td (1 - Tdap) Never done   Zoster Vaccines- Shingrix (1 of 2) Never done   COVID-19 Vaccine (4 - 2023-24 season) 11/05/2021   OPHTHALMOLOGY EXAM  04/13/2022   Diabetic kidney evaluation - Urine ACR  05/13/2022    Colorectal cancer screening: Type of screening: Cologuard. Completed 11/22/2020. Repeat every 3 years  Mammogram status: Ordered declined . Pt provided with contact info and advised to call to schedule appt.   Bone Density status: Completed 01/14/2020. Results reflect: Bone density results: OSTEOPENIA. Repeat every 5 years.  Lung Cancer Screening: (Low Dose CT Chest recommended if Age 78-80 years, 30 pack-year currently smoking OR have quit w/in 15years.) does not qualify.   Lung Cancer Screening Referral: n/a  Additional Screening:  Hepatitis C Screening: does not qualify;   Vision Screening: Recommended annual ophthalmology exams for early detection of glaucoma and other disorders of the eye. Is the patient up to date with their annual eye exam?  Yes  Who is the provider or what is the name of the office in which the patient attends annual eye exams? Dr.Johnson  If pt is not established with a provider, would they like to be referred to a provider to establish care? No .   Dental Screening: Recommended annual dental exams for proper oral hygiene  Community Resource Referral / Chronic Care Management: CRR required this visit?  No   CCM required this visit?  No      Plan:     I have personally reviewed and noted the following in the patient's chart:   Medical and social history Use of alcohol, tobacco or illicit drugs  Current medications and supplements including opioid prescriptions. Patient is not currently taking opioid prescriptions. Functional ability and status Nutritional status Physical activity Advanced directives List of other physicians Hospitalizations, surgeries, and ER visits in previous 12 months Vitals Screenings to include  cognitive, depression, and falls Referrals and appointments  In addition, I have reviewed and discussed with patient certain preventive protocols, quality metrics, and best practice recommendations. A written personalized care plan for preventive services as well as general preventive health recommendations were provided to patient.     Daphane Shepherd, LPN   [id number]   Nurse Notes: Due TDAP Everlean Alstrom Vaccine

## 2022-04-27 DIAGNOSIS — E1122 Type 2 diabetes mellitus with diabetic chronic kidney disease: Secondary | ICD-10-CM | POA: Diagnosis not present

## 2022-04-27 DIAGNOSIS — E211 Secondary hyperparathyroidism, not elsewhere classified: Secondary | ICD-10-CM | POA: Diagnosis not present

## 2022-04-27 DIAGNOSIS — R809 Proteinuria, unspecified: Secondary | ICD-10-CM | POA: Diagnosis not present

## 2022-04-27 DIAGNOSIS — N19 Unspecified kidney failure: Secondary | ICD-10-CM | POA: Diagnosis not present

## 2022-04-27 DIAGNOSIS — N189 Chronic kidney disease, unspecified: Secondary | ICD-10-CM | POA: Diagnosis not present

## 2022-04-27 DIAGNOSIS — E1129 Type 2 diabetes mellitus with other diabetic kidney complication: Secondary | ICD-10-CM | POA: Diagnosis not present

## 2022-04-27 DIAGNOSIS — I129 Hypertensive chronic kidney disease with stage 1 through stage 4 chronic kidney disease, or unspecified chronic kidney disease: Secondary | ICD-10-CM | POA: Diagnosis not present

## 2022-04-27 DIAGNOSIS — Z905 Acquired absence of kidney: Secondary | ICD-10-CM | POA: Diagnosis not present

## 2022-05-05 DIAGNOSIS — I129 Hypertensive chronic kidney disease with stage 1 through stage 4 chronic kidney disease, or unspecified chronic kidney disease: Secondary | ICD-10-CM

## 2022-05-05 DIAGNOSIS — N183 Chronic kidney disease, stage 3 unspecified: Secondary | ICD-10-CM

## 2022-05-05 DIAGNOSIS — E1122 Type 2 diabetes mellitus with diabetic chronic kidney disease: Secondary | ICD-10-CM

## 2022-05-05 DIAGNOSIS — Z794 Long term (current) use of insulin: Secondary | ICD-10-CM

## 2022-05-05 DIAGNOSIS — E785 Hyperlipidemia, unspecified: Secondary | ICD-10-CM

## 2022-05-26 ENCOUNTER — Ambulatory Visit (INDEPENDENT_AMBULATORY_CARE_PROVIDER_SITE_OTHER): Payer: Medicare HMO | Admitting: Pharmacist

## 2022-05-26 ENCOUNTER — Encounter: Payer: Self-pay | Admitting: Pharmacist

## 2022-05-26 VITALS — BP 131/75 | HR 65

## 2022-05-26 DIAGNOSIS — E1165 Type 2 diabetes mellitus with hyperglycemia: Secondary | ICD-10-CM

## 2022-05-26 DIAGNOSIS — E1122 Type 2 diabetes mellitus with diabetic chronic kidney disease: Secondary | ICD-10-CM

## 2022-05-26 DIAGNOSIS — E669 Obesity, unspecified: Secondary | ICD-10-CM

## 2022-05-26 MED ORDER — TRULICITY 4.5 MG/0.5ML ~~LOC~~ SOAJ: 4.5000 mg | SUBCUTANEOUS | 3 refills | Status: DC

## 2022-05-26 MED ORDER — INSULIN PEN NEEDLE 31G X 6 MM MISC: 11 refills | Status: DC

## 2022-05-26 NOTE — Progress Notes (Signed)
Chronic Care Management Pharmacy Note  05/26/2022 Name:  Brooke [name] MRN:  [id number] DOB:  [birth year]  Summary:  Diabetes: UNCONTROLLED A1c 7.8%;  much improved over the past year **Important ** -holding jardiance, kerendia per nephro/GFR 20--lisinopril 5mg  every other day restarted Current treatment: BASAGLAR 123XX123 UNITS, TRULICITY 4.5MG ,; Humalog added for meal times (10-15 units with meals) PAST meds: Metformin was discontinued due to GFR 28-->15 Continues to work on controlling her diabetes Denies personal and family history of Medullary thyroid cancer (MTC) Current glucose readings: fasting glucose: <130, mostly, post prandial glucose: <200 Submitting to DME [facility] for libre 3 Denies hypoglycemic/hyperglycemic symptoms Discussed meal planning options and Plate method for healthy eating Avoid sugary drinks and desserts Incorporate balanced protein, non starchy veggies, 1 serving of carbohydrate with each meal Increase water intake Increase physical activity as able Current exercise: N/A Educated on DIABETES MEDICATIONS, BG READINGS; libre Assessed patient finances.  RE-ENROLLED:  [facility] patient assistance for basaglar/trulicity/humalog-REFILLS SENT  HTN -controlled now that lisinopril restarted -patient checking at home/[employer]  HLD -switch to atorva (no renal adjustments and more potent) -Lipid Panel     Component Value Date/Time   CHOL 202 (H) 04/12/2022 0930   TRIG 254 (H) 04/12/2022 0930   HDL 38 (L) 04/12/2022 0930   CHOLHDL 5.3 (H) 04/12/2022 0930   LDLCALC 119 (H) 04/12/2022 0930   LABVLDL 45 (H) 04/12/2022 0930     Patient Goals/Self-Care Activities Over the next 90 days, patient will:  - take medications as prescribed check glucose DAILY, document, and provide at future appointments  Follow Up Plan: Telephone follow up appointment with care management team member scheduled for: 3 months   Subjective: Brooke [name] is an 67 y.o.  year old female who is a primary patient of Rakes, Connye Burkitt, FNP.  The patient was referred to the Chronic Care Management team for assistance with care management needs subsequent to provider initiation of CCM services and plan of care.    Engaged with patient by telephone for follow up visit in response to provider referral for CCM services.   Objective:  LABS:  Lab Results  Component Value Date   CREATININE 3.22 (HH) 12/27/2021   CREATININE 2.86 (H) 09/17/2021   CREATININE 2.90 (H) 08/06/2021     Lab Results  Component Value Date   HGBA1C 7.8 (H) 04/12/2022         Component Value Date/Time   CHOL 202 (H) 04/12/2022 0930   TRIG 254 (H) 04/12/2022 0930   HDL 38 (L) 04/12/2022 0930   CHOLHDL 5.3 (H) 04/12/2022 0930   LDLCALC 119 (H) 04/12/2022 0930     Clinical ASCVD: No   The 10-year ASCVD risk score (Arnett DK, et al., 2019) is: 25.5%   Values used to calculate the score:     Age: 73 years     Sex: Female     Is Non-Hispanic African American: Yes     Diabetic: Yes     Tobacco smoker: No     Systolic Blood Pressure: [id number] mmHg     Is BP treated: Yes     HDL Cholesterol: 38 mg/dL     Total Cholesterol: 202 mg/dL    Other: (CHADS2VASc if Afib, PHQ9 if depression, MMRC or CAT for COPD, ACT, DEXA)    BP Readings from Last 3 Encounters:  05/26/22 131/75  04/22/22 135/79  01/26/22 (!) 151/71      SDOH:  (Social Determinants of Health) assessments and interventions performed:  Allergies  Allergen Reactions   Jardiance [Empagliflozin]     Holding per nephro   Carrington Clamp [Finerenone]     Holding per nephro    Medications Reviewed Today     Reviewed by Lavera Guise, [facility] (Pharmacist) on 05/26/22 at 1135  Med List Status: <None>   Medication Order Taking? Sig Documenting Provider Last Dose Status Informant  acetaminophen (TYLENOL) 500 MG tablet [id number] No Take 1,000 mg by mouth every 8 (eight) hours as needed for moderate pain. [provider] Taking  Active Self  albuterol (VENTOLIN HFA) 108 (90 Base) MCG/ACT inhaler [id number] No Inhale 2 puffs into the lungs every 6 (six) hours as needed for wheezing or shortness of breath. [name], FNP Taking Active Self  amLODipine (NORVASC) 10 MG tablet [id number] No Take 1 tablet (10 mg total) by mouth daily. Ivy Lynn, NP Taking Active   atenolol (TENORMIN) 100 MG tablet [id number] No Take 1 tablet (100 mg total) by mouth every morning. Ivy Lynn, NP Taking Active   atorvastatin (LIPITOR) 20 MG tablet [id number] No Take 1 tablet (20 mg total) by mouth daily. Baruch Gouty, FNP Taking Active   budesonide-formoterol [facility]) 160-4.5 MCG/ACT inhaler [id number] No Inhale 2 puffs into the lungs 2 (two) times daily. [name], FNP Taking Active Self           Med Note Lavera Guise   Fri Apr 22, 2022  2:05 PM)    calcium acetate (PHOSLO) 667 MG tablet [id number] No Take by mouth. [provider] Taking Active   Camphor-Menthol-Methyl Sal (SALONPAS) 3.03-12-08 % PTCH [id number] No Apply 1 patch topically daily as needed (pain). [provider] Taking Active Self  Cholecalciferol 25 MCG (1000 UT) capsule [id number] No Take 1,000 Units by mouth daily. [provider] Taking Active Self  docusate sodium (COLACE) 100 MG capsule [id number] No Take 1 capsule (100 mg total) by mouth 2 (two) times daily. Debbrah Alar, PA-C Taking Active   Dulaglutide (TRULICITY) 4.5 0000000 SOPN [id number]  Inject 4.5 mg as directed once a week. Baruch Gouty, FNP  Active   ergocalciferol (VITAMIN D2) 1.25 MG (50000 UT) capsule [id number] No Take 50,000 Units by mouth once a week. [provider] Taking Active   hydrochlorothiazide (HYDRODIURIL) 25 MG tablet [id number] No Take 1 tablet (25 mg total) by mouth daily. Ivy Lynn, NP Taking Active   Insulin Glargine [facility] KWIKPEN) 100 UNIT/ML [id number] No Inject 35 Units into the skin at bedtime. Baruch Gouty, FNP  Taking Active   insulin lispro (HUMALOG KWIKPEN) 100 UNIT/ML KwikPen [id number] No Inject 10-15 Units into the skin See admin instructions. 10 units WITH BREAKFAST AND LUNCH, TAKE 15 units at [facility], FNP Taking Active   Discontinued 05/26/22 1130   Insulin Pen Needle 31G X 6 MM MISC [id number] Yes Use to inject insulin 4 times daily as directed. DX: E11.65 Baruch Gouty, FNP  Active   lisinopril (ZESTRIL) 5 MG tablet [id number] No Take 5 mg by mouth daily. [provider] Taking Active            Med Note Blanca Friend, Royce Macadamia   Fri Apr 22, 2022  2:06 PM) Taking every other day per nephro  Menthol, Topical Analgesic, (BIOFREEZE) 4 % GEL [id number] No Apply 1 application. topically daily as needed (pain). [provider] Taking Active Self  sodium bicarbonate 650 MG tablet [id number] No Take by mouth. [provider] Taking Active  Med List Note Lavera Guise [facility] [id number]): trulicity, basaglar, humalog escribe to [facility] mail order symbicort-escribe to [facility] mail order jardiance-escribe to [facility] mail order               Goals Addressed               This Visit's Progress     Patient Stated     T2DM PharmD goal (pt-stated)        Current Barriers:  Unable to independently afford treatment regimen Unable to achieve control of T2DM, CKD   Pharmacist Clinical Goal(s):  Over the next 90 days, patient will verbalize ability to afford treatment regimen maintain control of T2DM as evidenced by GOAL A1C<7%  through collaboration with PharmD and provider.    Interventions: 1:1 collaboration with [name], FNP regarding development and update of comprehensive plan of care as evidenced by provider attestation and co-signature Inter-disciplinary care team collaboration (see longitudinal plan of care) Comprehensive medication review performed; medication list updated in electronic medical  record  Diabetes: UNCONTROLLED A1c 7.8%;  much improved over the past year **Important ** -holding jardiance, kerendia per nephro/GFR 20--lisinopril 5mg  every other day restarted Current treatment: BASAGLAR 123XX123 UNITS, TRULICITY 4.5MG ,; Humalog added for meal times (10-15 units with meals) PAST meds: Metformin was discontinued due to GFR 28-->15 Continues to work on controlling her diabetes Denies personal and family history of Medullary thyroid cancer (MTC) Current glucose readings: fasting glucose: <130, mostly, post prandial glucose: <200 Submitting to DME [facility] for libre 3 Denies hypoglycemic/hyperglycemic symptoms Discussed meal planning options and Plate method for healthy eating Avoid sugary drinks and desserts Incorporate balanced protein, non starchy veggies, 1 serving of carbohydrate with each meal Increase water intake Increase physical activity as able Current exercise: N/A Educated on DIABETES MEDICATIONS, BG READINGS; [place] patient finances.  RE-ENROLLED:  [facility] patient assistance for basaglar/trulicity/humalog-REFILLS SENT  HTN -controlled now that lisinopril restarted -patient checking at home/[employer]  HLD -switch to atorva (no renal adjustments and more potent) -Lipid Panel     Component Value Date/Time   CHOL 202 (H) 04/12/2022 0930   TRIG 254 (H) 04/12/2022 0930   HDL 38 (L) 04/12/2022 0930   CHOLHDL 5.3 (H) 04/12/2022 0930   LDLCALC 119 (H) 04/12/2022 0930   LABVLDL 45 (H) 04/12/2022 0930    Patient Goals/Self-Care Activities Over the next 90 days, patient will:  - take medications as prescribed check glucose DAILY, document, and provide at future appointments  Follow Up Plan: Telephone follow up appointment with care management team member scheduled for: 3 months         Plan: Telephone follow up appointment with care management team member scheduled for:  3 MONTHS; PCP NEXT WEEK      Regina Eck, PharmD,  [facility] Pharmacist, [facility]

## 2022-06-01 ENCOUNTER — Ambulatory Visit: Payer: Medicare HMO | Admitting: Family Medicine

## 2022-06-05 DIAGNOSIS — I10 Essential (primary) hypertension: Secondary | ICD-10-CM

## 2022-06-05 DIAGNOSIS — E1159 Type 2 diabetes mellitus with other circulatory complications: Secondary | ICD-10-CM

## 2022-06-05 DIAGNOSIS — Z794 Long term (current) use of insulin: Secondary | ICD-10-CM

## 2022-06-13 ENCOUNTER — Ambulatory Visit: Payer: Medicare HMO | Admitting: Nurse Practitioner

## 2022-06-13 DIAGNOSIS — R059 Cough, unspecified: Secondary | ICD-10-CM | POA: Diagnosis not present

## 2022-06-13 DIAGNOSIS — J029 Acute pharyngitis, unspecified: Secondary | ICD-10-CM | POA: Diagnosis not present

## 2022-06-13 DIAGNOSIS — J209 Acute bronchitis, unspecified: Secondary | ICD-10-CM | POA: Diagnosis not present

## 2022-06-23 ENCOUNTER — Telehealth: Payer: Self-pay | Admitting: *Deleted

## 2022-06-23 ENCOUNTER — Encounter: Payer: Self-pay | Admitting: Family Medicine

## 2022-06-23 ENCOUNTER — Ambulatory Visit (INDEPENDENT_AMBULATORY_CARE_PROVIDER_SITE_OTHER): Payer: Medicare HMO | Admitting: Family Medicine

## 2022-06-23 ENCOUNTER — Telehealth: Payer: Medicare HMO

## 2022-06-23 VITALS — BP 120/72 | HR 68 | Temp 98.2°F | Ht 65.0 in | Wt 212.0 lb

## 2022-06-23 DIAGNOSIS — E1159 Type 2 diabetes mellitus with other circulatory complications: Secondary | ICD-10-CM

## 2022-06-23 DIAGNOSIS — E1122 Type 2 diabetes mellitus with diabetic chronic kidney disease: Secondary | ICD-10-CM | POA: Diagnosis not present

## 2022-06-23 DIAGNOSIS — E559 Vitamin D deficiency, unspecified: Secondary | ICD-10-CM

## 2022-06-23 DIAGNOSIS — Z794 Long term (current) use of insulin: Secondary | ICD-10-CM | POA: Diagnosis not present

## 2022-06-23 DIAGNOSIS — E785 Hyperlipidemia, unspecified: Secondary | ICD-10-CM

## 2022-06-23 DIAGNOSIS — N183 Chronic kidney disease, stage 3 unspecified: Secondary | ICD-10-CM | POA: Diagnosis not present

## 2022-06-23 DIAGNOSIS — E1165 Type 2 diabetes mellitus with hyperglycemia: Secondary | ICD-10-CM | POA: Diagnosis not present

## 2022-06-23 DIAGNOSIS — E1169 Type 2 diabetes mellitus with other specified complication: Secondary | ICD-10-CM | POA: Diagnosis not present

## 2022-06-23 DIAGNOSIS — I152 Hypertension secondary to endocrine disorders: Secondary | ICD-10-CM

## 2022-06-23 DIAGNOSIS — Z7985 Long-term (current) use of injectable non-insulin antidiabetic drugs: Secondary | ICD-10-CM

## 2022-06-23 NOTE — Telephone Encounter (Signed)
   CCM RN Visit Note   06/23/22 Name: Brooke [name] MRN: [id number]      DOB: September 24, 1955  Subjective: Brooke [name] is a 67 y.o. year old female who is a primary care patient of Neale Burly FNP.  The patient was referred to the Chronic Care Management team for assistance with care management needs subsequent to provider initiation of CCM services and plan of care.      Telephone call to patient to reschedule today's CCM appointment, spoke with patient and rescheduled for 06/27/22.  Plan:Telephone follow up appointment with care management team member scheduled for:  06/27/22 at 315 pm  Irving Shows [facility], BSN RN Case Manager [facility] [phone]

## 2022-06-23 NOTE — Progress Notes (Signed)
Acute Office Visit  Subjective:  Patient ID: Brooke [name], female    DOB: May 10, 1955, 67 y.o.   MRN: [id number]  Chief Complaint  Patient presents with   Establish Care    Nmedical management of chronic issues   HPI Patient is in today for management of chronic conditions   HTN  Denies changes to vision. Palpiations, CP, SOB. Takes lisinopril  every other day.   Type 2 Diabetes  High at home: 160s; Low at home: 90s, Taking medication(s): Trulicity, Humalog, Hospital doctor. Reports that she has had one episode of hypoglycemia (BG 80s) in the last month. Symptomatic with shakiness, diaphoretic. Resolved with snack.   Last eye exam: Was going to [facility], referred to Dr. Dione Booze in [place]. Has appt next  Last foot exam: Due today. Has not seen podiatry. Reports that she monitors her feet.  Last A1c:  Lab Results  Component Value Date   HGBA1C 7.8 (H) 04/12/2022   Nephropathy screen indicated?: follows with [facility] Last flu, zoster and/or pneumovax:  Immunization History  Administered Date(s) Administered   Fluad Quad(high Dose 65+) 03/13/2019, 11/28/2019, 12/10/2020   PFIZER(Purple Top)SARS-COV-2 Vaccination 04/12/2019, 05/03/2019, 01/14/2020    ROS: Denies dizziness, LOC, polyuria, polydipsia, unintended weight loss/gain, foot ulcerations, numbness or tingling in extremities, shortness of breath or chest pain.   Vitamin D deficiency Denies numbness and tingling.   Hyperlipidemia  Switched to lipitor recently. Has some cramping in her calves, states that it is tolerable.    ROS As per HPI   Objective:  BP 120/72   Pulse 68   Temp 98.2 F (36.8 C)   Ht  (1.651 m)   Wt 212 lb (96.2 kg)   LMP 03/16/2012   SpO2 98%   BMI 35.28 kg/m   Physical Exam Constitutional:      General: She is not in acute distress.    Appearance: Normal appearance. She is not ill-appearing, toxic-appearing or diaphoretic.  Cardiovascular:     Rate and Rhythm: Normal rate.      Pulses: Normal pulses.     Heart sounds: Normal heart sounds. No murmur heard.    No gallop.  Pulmonary:     Effort: Pulmonary effort is normal. No respiratory distress.     Breath sounds: Normal breath sounds. No stridor. No wheezing, rhonchi or rales.  Musculoskeletal:     Right foot: Normal range of motion. No deformity, bunion, Charcot foot, foot drop or prominent metatarsal heads.     Left foot: Normal range of motion. No deformity, bunion, Charcot foot, foot drop or prominent metatarsal heads.  Feet:     Right foot:     Protective Sensation: 10 sites tested.  9 sites sensed.     Skin integrity: Skin integrity normal.     Toenail Condition: Right toenails are normal.     Left foot:     Protective Sensation: 10 sites tested.  9 sites sensed.     Skin integrity: Skin integrity normal.     Toenail Condition: Left toenails are normal.     Comments: Decreased sensation bilateral heels  Skin:    General: Skin is warm.     Capillary Refill: Capillary refill takes less than 2 seconds.  Neurological:     General: No focal deficit present.     Mental Status: She is alert and oriented to person, place, and time. Mental status is at baseline.     Motor: No weakness.  Psychiatric:  Mood and Affect: Mood normal.        Behavior: Behavior normal.        Thought Content: Thought content normal.        Judgment: Judgment normal.    Assessment & Plan:  1. Type 2 diabetes mellitus with hyperglycemia, with long-term current use of insulin Not controlled at last A1C of 7.8% on 04/12/22.  Will continue current regimen and follow up in one month for repeat A1C.  Reiterated Diabetes care. Completed Diabetic foot exam. Patient is UTD with eye exam. She is followed by nephrology and has been followed with pharmacy.   2. Hyperlipidemia associated with type 2 diabetes mellitus Continue current regimen. Will recheck lipid panel in one month at follow up. She was recently switched to Lipitor  04/22/22 for renal coverage and potency.   3. Hypertension associated with diabetes Well controlled. Currently managed by nephrology. Continue with current regimen.   4. CKD stage 3 due to type 2 diabetes mellitus Managed by nephrology.  5. Vitamin D insufficiency Supplemented and managed by nephrology. Continue to follow with nephrology. Discussed with patient that we can complete repeat labs in preparation for nephrology visit if needed.   The above assessment and management plan was discussed with the patient. The patient verbalized understanding of and has agreed to the management plan using shared-decision making. Patient is aware to call the clinic if they develop any new symptoms or if symptoms fail to improve or worsen. Patient is aware when to return to the clinic for a follow-up visit. Patient educated on when it is appropriate to go to the emergency department.   Neale Burly, DNP-FNP [facility] [address] [place], [place] [address] [phone]

## 2022-06-27 ENCOUNTER — Ambulatory Visit (INDEPENDENT_AMBULATORY_CARE_PROVIDER_SITE_OTHER): Payer: Medicare HMO | Admitting: *Deleted

## 2022-06-27 DIAGNOSIS — E1159 Type 2 diabetes mellitus with other circulatory complications: Secondary | ICD-10-CM

## 2022-06-27 DIAGNOSIS — E1165 Type 2 diabetes mellitus with hyperglycemia: Secondary | ICD-10-CM

## 2022-06-27 NOTE — Chronic Care Management (AMB) (Signed)
Chronic Care Management   CCM RN Visit Note  06/27/2022 Name: Brooke [name] MRN: [id number] DOB: 12/23/1955  Subjective: Brooke [name] is a 67 y.o. year old female who is a primary care patient of Ellamae Sia, [name], FNP. The patient was referred to the Chronic Care Management team for assistance with care management needs subsequent to provider initiation of CCM services and plan of care.    Today's Visit:  Engaged with patient by telephone for follow up visit.        Goals Addressed             This Visit's Progress    CCM (CHRONIC KIDNEY DISEASE) EXPECTED OUTCOME: MONITOR, SELF-MANAGE AND REDUCE SYMPTOMS OF CHRONIC KIDNEY DISEASE       Current Barriers:  Knowledge Deficits related to CKD Chronic Disease Management support and education needs related to CKD No Advanced Directives in place- Pt declines Pt reports she is concerned about her kidneys and trying to keep blood sugar, blood pressure WNL to preserve kidney function, does not drink sugary drinks Pt states she continues to work on her diet, no new concerns reported today  Planned Interventions: Assessed the patient     understanding of chronic kidney disease    Evaluation of current treatment plan related to chronic kidney disease self management and patient's adherence to plan as established by provider      Reviewed medications with patient and discussed importance of compliance    Counseled on the importance of exercise goals with target of 150 minutes per week     Advised patient, providing education and rationale, to monitor blood pressure daily and record, calling PCP for findings outside established parameters    Discussed complications of poorly controlled blood pressure such as heart disease, stroke, circulatory complications, vision complications, kidney impairment, sexual dysfunction    Reinforced importance of staying active Reinforced low sodium diet and nutritious food choices  Symptom  Management: Take medications as prescribed   Attend all scheduled provider appointments Call pharmacy for medication refills 3-7 days in advance of running out of medications Attend church or other social activities Perform all self care activities independently  Perform IADL's (shopping, preparing meals, housekeeping, managing finances) independently Call provider office for new concerns or questions  Try to do some type of exercise daily- walking is good  Follow Up Plan: Telephone follow up appointment with care management team member scheduled for:  09/22/22 at 215 pm       CCM (DIABETES) EXPECTED OUTCOME:  MONITOR, SELF-MANAGE AND REDUCE SYMPTOMS OF DIABETES       Current Barriers:  Knowledge Deficits related to Diabetes management Chronic Disease Management support and education needs related to Diabetes Financial Constraints.  No Advanced Directives in place- pt declines Pt reports she checks CBG once daily with fasting ranges 80-140 Pt reports she tried to eat healthy and be mindful of carbohydrate intake at each meal and with snacks Pt reports she will have another AIC checked in one month  Planned Interventions: Reviewed medications with patient and discussed importance of medication adherence;        Counseled on importance of regular laboratory monitoring as prescribed;        Advised patient, providing education and rationale, to check cbg twice daily and record        call provider for findings outside established parameters;       Review of patient status, including review of consultants reports, relevant laboratory and other test results, and medications completed;  Reinforced carbohydrate modified diet  Symptom Management: Take medications as prescribed   Attend all scheduled provider appointments Call pharmacy for medication refills 3-7 days in advance of running out of medications Attend church or other social activities Perform all self care activities  independently  Perform IADL's (shopping, preparing meals, housekeeping, managing finances) independently Call provider office for new concerns or questions  check blood sugar at prescribed times: twice daily check feet daily for cuts, sores or redness enter blood sugar readings and medication or insulin into daily log take the blood sugar log to all doctor visits take the blood sugar meter to all doctor visits trim toenails straight across fill half of plate with vegetables limit fast food meals to no more than 1 per week manage portion size prepare main meal at home 3 to 5 days each week read food labels for fat, fiber, carbohydrates and portion size set a realistic goal keep feet up while sitting wash and dry feet carefully every day  Follow Up Plan: Telephone follow up appointment with care management team member scheduled for:   09/22/22 at 215 pm       CCM (HYPERTENSION) EXPECTED OUTCOME: MONITOR, SELF-MANAGE AND REDUCE SYMPTOMS OF HYPERTENSION       Current Barriers:  Knowledge Deficits related to Hypertension management Chronic Disease Management support and education needs related to Hypertension No Advanced Directives in place- pt declines Pt reports she lives with spouse, is independent in all aspects of her care, is trying to exercise now since her knees are feeling better,  tries to adhere to healthy diet Pt reports she checks blood pressure on occasion and readings have been good  Planned Interventions: Evaluation of current treatment plan related to hypertension self management and patient's adherence to plan as established by provider;   Provided education to patient re: stroke prevention, s/s of heart attack and stroke; Reviewed prescribed diet low sodium Reviewed medications with patient and discussed importance of compliance;  Counseled on the importance of exercise goals with target of 150 minutes per week Discussed plans with patient for ongoing care management  follow up and provided patient with direct contact information for care management team; Advised patient, providing education and rationale, to monitor blood pressure daily and record, calling PCP for findings outside established parameters;  Discussed complications of poorly controlled blood pressure such as heart disease, stroke, circulatory complications, vision complications, kidney impairment, sexual dysfunction;  Reviewed all upcoming scheduled appointments  Symptom Management: Take medications as prescribed   Attend all scheduled provider appointments Call pharmacy for medication refills 3-7 days in advance of running out of medications Attend church or other social activities Perform all self care activities independently  Perform IADL's (shopping, preparing meals, housekeeping, managing finances) independently Call provider office for new concerns or questions  check blood pressure weekly choose a place to take my blood pressure (home, clinic or office, retail store) write blood pressure results in a log or diary learn about high blood pressure keep a blood pressure log take blood pressure log to all doctor appointments call doctor for signs and symptoms of high blood pressure keep all doctor appointments take medications for blood pressure exactly as prescribed begin an exercise program report new symptoms to your doctor eat more whole grains, fruits and vegetables, lean meats and healthy fats Follow low sodium diet- read food labels Education in my chart- stroke and heart attack  Follow Up Plan: Telephone follow up appointment with care management team member scheduled for:  09/22/22 at 215 pm          Plan:Telephone follow up appointment with care management team member scheduled for:  09/22/22 at 215 pm  Irving Shows [facility], BSN RN Case Manager [facility] [phone]

## 2022-06-27 NOTE — Chronic Care Management (AMB) (Signed)
Chronic Care Management   CCM RN Visit Note  06/27/2022 Name: Oscar Forman MRN: [id number] DOB: 1956/01/31  Subjective: Felicity Penix is a 67 y.o. year old female who is a primary care patient of Ellamae Sia, [name], FNP. The patient was referred to the Chronic Care Management team for assistance with care management needs subsequent to provider initiation of CCM services and plan of care.    Today's Visit:  Engaged with patient by telephone for follow up visit.        Goals Addressed             This Visit's Progress    CCM (CHRONIC KIDNEY DISEASE) EXPECTED OUTCOME: MONITOR, SELF-MANAGE AND REDUCE SYMPTOMS OF CHRONIC KIDNEY DISEASE       Current Barriers:  Knowledge Deficits related to CKD Chronic Disease Management support and education needs related to CKD No Advanced Directives in place- Pt declines Pt reports she is concerned about her kidneys and trying to keep blood sugar, blood pressure WNL to preserve kidney function, does not drink sugary drinks Pt states she continues to work on her diet, no new concerns reported today  Planned Interventions: Assessed the patient     understanding of chronic kidney disease    Evaluation of current treatment plan related to chronic kidney disease self management and patient's adherence to plan as established by provider      Reviewed medications with patient and discussed importance of compliance    Counseled on the importance of exercise goals with target of 150 minutes per week     Advised patient, providing education and rationale, to monitor blood pressure daily and record, calling PCP for findings outside established parameters    Discussed complications of poorly controlled blood pressure such as heart disease, stroke, circulatory complications, vision complications, kidney impairment, sexual dysfunction    Reinforced importance of staying active Reinforced low sodium diet and nutritious food choices  Symptom  Management: Take medications as prescribed   Attend all scheduled provider appointments Call pharmacy for medication refills 3-7 days in advance of running out of medications Attend church or other social activities Perform all self care activities independently  Perform IADL's (shopping, preparing meals, housekeeping, managing finances) independently Call provider office for new concerns or questions  Try to do some type of exercise daily- walking is good  Follow Up Plan: Telephone follow up appointment with care management team member scheduled for:  09/22/22 at 215 pm       CCM (DIABETES) EXPECTED OUTCOME:  MONITOR, SELF-MANAGE AND REDUCE SYMPTOMS OF DIABETES       Current Barriers:  Knowledge Deficits related to Diabetes management Chronic Disease Management support and education needs related to Diabetes Financial Constraints.  No Advanced Directives in place- pt declines Pt reports she checks CBG once daily with fasting ranges 80-140 Pt reports she tried to eat healthy and be mindful of carbohydrate intake at each meal and with snacks Pt reports she will have another AIC checked in one month  Planned Interventions: Reviewed medications with patient and discussed importance of medication adherence;        Counseled on importance of regular laboratory monitoring as prescribed;        Advised patient, providing education and rationale, to check cbg twice daily and record        call provider for findings outside established parameters;       Review of patient status, including review of consultants reports, relevant laboratory and other test results, and medications completed;  Reinforced carbohydrate modified diet  Symptom Management: Take medications as prescribed   Attend all scheduled provider appointments Call pharmacy for medication refills 3-7 days in advance of running out of medications Attend church or other social activities Perform all self care activities  independently  Perform IADL's (shopping, preparing meals, housekeeping, managing finances) independently Call provider office for new concerns or questions  check blood sugar at prescribed times: twice daily check feet daily for cuts, sores or redness enter blood sugar readings and medication or insulin into daily log take the blood sugar log to all doctor visits take the blood sugar meter to all doctor visits trim toenails straight across fill half of plate with vegetables limit fast food meals to no more than 1 per week manage portion size prepare main meal at home 3 to 5 days each week read food labels for fat, fiber, carbohydrates and portion size set a realistic goal keep feet up while sitting wash and dry feet carefully every day  Follow Up Plan: Telephone follow up appointment with care management team member scheduled for:   09/22/22 at 215 pm          Plan:Telephone follow up appointment with care management team member scheduled for:  09/22/22 at 215 pm  Irving Shows [facility], BSN RN Case Manager [facility] [phone]

## 2022-06-27 NOTE — Patient Instructions (Signed)
Please call the care guide team at [phone] if you need to cancel or reschedule your appointment.   If you are experiencing a Mental Health or Behavioral Health Crisis or need someone to talk to, please call the Suicide and Crisis Lifeline: 988 call the [place] National Suicide Prevention Lifeline: (769)757-9120 or TTY: 873-488-8393 TTY 539-620-4060) to talk to a trained counselor call 1-800-273-TALK (toll free, 24 hour hotline) go to [facility] [address] [place] [phone]) call the [facility]: [phone] call 911   Following is a copy of the CCM Program Consent:  CCM service includes personalized support from designated clinical staff supervised by the physician, including individualized plan of care and coordination with other care providers 24/7 contact phone numbers for assistance for urgent and routine care needs. Service will only be billed when office clinical staff spend 20 minutes or more in a month to coordinate care. Only one practitioner may furnish and bill the service in a calendar month. The patient may stop CCM services at amy time (effective at the end of the month) by phone call to the office staff. The patient will be responsible for cost sharing (co-pay) or up to 20% of the service fee (after annual deductible is met)  Following is a copy of your full provider care plan:   Goals Addressed             This Visit's Progress    CCM (CHRONIC KIDNEY DISEASE) EXPECTED OUTCOME: MONITOR, SELF-MANAGE AND REDUCE SYMPTOMS OF CHRONIC KIDNEY DISEASE       Current Barriers:  Knowledge Deficits related to CKD Chronic Disease Management support and education needs related to CKD No Advanced Directives in place- Pt declines Pt reports she is concerned about her kidneys and trying to keep blood sugar, blood pressure WNL to preserve kidney function, does not drink sugary drinks Pt states she continues to work on  her diet, no new concerns reported today  Planned Interventions: Assessed the patient     understanding of chronic kidney disease    Evaluation of current treatment plan related to chronic kidney disease self management and patient's adherence to plan as established by provider      Reviewed medications with patient and discussed importance of compliance    Counseled on the importance of exercise goals with target of 150 minutes per week     Advised patient, providing education and rationale, to monitor blood pressure daily and record, calling PCP for findings outside established parameters    Discussed complications of poorly controlled blood pressure such as heart disease, stroke, circulatory complications, vision complications, kidney impairment, sexual dysfunction    Reinforced importance of staying active Reinforced low sodium diet and nutritious food choices  Symptom Management: Take medications as prescribed   Attend all scheduled provider appointments Call pharmacy for medication refills 3-7 days in advance of running out of medications Attend church or other social activities Perform all self care activities independently  Perform IADL's (shopping, preparing meals, housekeeping, managing finances) independently Call provider office for new concerns or questions  Try to do some type of exercise daily- walking is good  Follow Up Plan: Telephone follow up appointment with care management team member scheduled for:  09/22/22 at 215 pm       CCM (DIABETES) EXPECTED OUTCOME:  MONITOR, SELF-MANAGE AND REDUCE SYMPTOMS OF DIABETES       Current Barriers:  Knowledge Deficits related to Diabetes management Chronic Disease Management support and education  needs related to Diabetes Financial Constraints.  No Advanced Directives in place- pt declines Pt reports she checks CBG once daily with fasting ranges 80-140 Pt reports she tried to eat healthy and be mindful of carbohydrate intake at  each meal and with snacks Pt reports she will have another AIC checked in one month  Planned Interventions: Reviewed medications with patient and discussed importance of medication adherence;        Counseled on importance of regular laboratory monitoring as prescribed;        Advised patient, providing education and rationale, to check cbg twice daily and record        call provider for findings outside established parameters;       Review of patient status, including review of consultants reports, relevant laboratory and other test results, and medications completed;       Reinforced carbohydrate modified diet  Symptom Management: Take medications as prescribed   Attend all scheduled provider appointments Call pharmacy for medication refills 3-7 days in advance of running out of medications Attend church or other social activities Perform all self care activities independently  Perform IADL's (shopping, preparing meals, housekeeping, managing finances) independently Call provider office for new concerns or questions  check blood sugar at prescribed times: twice daily check feet daily for cuts, sores or redness enter blood sugar readings and medication or insulin into daily log take the blood sugar log to all doctor visits take the blood sugar meter to all doctor visits trim toenails straight across fill half of plate with vegetables limit fast food meals to no more than 1 per week manage portion size prepare main meal at home 3 to 5 days each week read food labels for fat, fiber, carbohydrates and portion size set a realistic goal keep feet up while sitting wash and dry feet carefully every day  Follow Up Plan: Telephone follow up appointment with care management team member scheduled for:   09/22/22 at 215 pm       CCM (HYPERTENSION) EXPECTED OUTCOME: MONITOR, SELF-MANAGE AND REDUCE SYMPTOMS OF HYPERTENSION       Current Barriers:  Knowledge Deficits related to Hypertension  management Chronic Disease Management support and education needs related to Hypertension No Advanced Directives in place- pt declines Pt reports she lives with spouse, is independent in all aspects of her care, is trying to exercise now since her knees are feeling better,  tries to adhere to healthy diet Pt reports she checks blood pressure on occasion and readings have been good  Planned Interventions: Evaluation of current treatment plan related to hypertension self management and patient's adherence to plan as established by provider;   Provided education to patient re: stroke prevention, s/s of heart attack and stroke; Reviewed prescribed diet low sodium Reviewed medications with patient and discussed importance of compliance;  Counseled on the importance of exercise goals with target of 150 minutes per week Discussed plans with patient for ongoing care management follow up and provided patient with direct contact information for care management team; Advised patient, providing education and rationale, to monitor blood pressure daily and record, calling PCP for findings outside established parameters;  Discussed complications of poorly controlled blood pressure such as heart disease, stroke, circulatory complications, vision complications, kidney impairment, sexual dysfunction;  Reviewed all upcoming scheduled appointments  Symptom Management: Take medications as prescribed   Attend all scheduled provider appointments Call pharmacy for medication refills 3-7 days in advance of running out of medications Attend church or other  social activities Perform all self care activities independently  Perform IADL's (shopping, preparing meals, housekeeping, managing finances) independently Call provider office for new concerns or questions  check blood pressure weekly choose a place to take my blood pressure (home, clinic or office, retail store) write blood pressure results in a log or  diary learn about high blood pressure keep a blood pressure log take blood pressure log to all doctor appointments call doctor for signs and symptoms of high blood pressure keep all doctor appointments take medications for blood pressure exactly as prescribed begin an exercise program report new symptoms to your doctor eat more whole grains, fruits and vegetables, lean meats and healthy fats Follow low sodium diet- read food labels Education in my chart- stroke and heart attack  Follow Up Plan: Telephone follow up appointment with care management team member scheduled for:   09/22/22 at 215 pm          Patient verbalizes understanding of instructions and care plan provided today and agrees to view in MyChart. Active MyChart status and patient understanding of how to access instructions and care plan via MyChart confirmed with patient.  Telephone follow up appointment with care management team member scheduled for:  09/22/22 at 215 pm  Heart Attack - Warning Signs and What to Do A heart attack is a condition that occurs when your heart does not get enough oxygen. When this happens, the heart muscle begins to die. This can cause lasting damage if not treated right away. A heart attack is a medical emergency. Be aware of the warning signs of a heart attack and act quickly. Risk Factors Your health care provider will talk with you about risk factors. These may include: Aging. Having a personal or family history of chest pain, heart attack, stroke, or narrowing of the arteries due to plaque buildup. Having taken chemotherapy or immune-suppressing medicines. Being female. Having a pregnancy history that includes pre-eclampsia. Being overweight or obese. Having any of these conditions: High blood pressure. High cholesterol. Diabetes. Certain lifestyle choices may put you at a higher risk of a heart attack. These include: Drinking too much alcohol. Not getting regular exercise. Smoking  or using tobacco products. Using street drugs. Warning signs or symptoms of a heart attack Heart damage can occur within the first few hours of a heart attack. This is why it is important to recognize early symptoms and get help right away. Warning signs and symptoms may include: Chest pain that may feel like: Crushing or squeezing. Tightness, pressure, fullness, or heaviness. Pain in the arm, neck, jaw, back, or upper body. Heartburn or upset stomach. Shortness of breath. Vomiting or nausea. Sudden sweating or clammy skin. Sudden light-headedness, dizziness, or passing out. Feeling tired (fatigue). Sudden feeling of anxiety. Fast or irregular heartbeat (palpitations). Heart attacks can be sudden and intense or can start slowly, with mild pain or discomfort. In some cases, heart attacks may happen with very mild symptoms or no symptoms at all (silent heart attacks). Silent heart attacks are more common in older adults or people with diabetes. Heart attack symptoms that can be mistaken for a less serious health issue Certain symptoms can be quickly associated with a heart attack, such as chest pain or pressure. However, there are other symptoms that may seem less serious or not related to a heart attack, but they actually are. These symptoms are called atypical symptoms. Be alert for the following atypical symptoms: Sharp pain in the side or chest that occurs with  coughing or breathing. Shortness of breath or trouble breathing. Pain that spreads above the jaw or down into the body. Can symptoms differ between men and women? Heart attack symptoms can be different among men and women. Men may feel pain and numbness in the left side of their chest, while women feel it in the right side. Women may have signs and symptoms that may not be quickly associated with a heart attack. They may be more subtle and sometimes confusing. These include: Nausea. Dizziness. Shortness of  breath. Fatigue. Upper back pain that travels up the back, neck, and into the jaw. Where to find more information American Heart Association: heart.org Centers for Disease Control and Prevention: TonerPromos.no Get help right away if: You have any combination of these symptoms: Severe chest discomfort, especially if the pain is crushing or pressure-like and spreads to your arms, back, neck, or jaw. Discomfort in the chest, neck, arm, jaw, or back (angina) that lasts for longer than 5 minutes and medicine does not help. Palpitations. Trouble breathing or shortness of breath. Sudden sweating or clammy skin. Nausea or vomiting. Unexplained tiredness or weakness. These symptoms may be an emergency. Get help right away. Call 911. Do not wait to see if the symptoms will go away. Do not drive yourself to the hospital. This information is not intended to replace advice given to you by your health care provider. Make sure you discuss any questions you have with your health care provider. Document Revised: 08/19/2021 Document Reviewed: 08/19/2021 Elsevier Patient Education  2023 [employer] Stroke Prevention Some medical conditions and behaviors can lead to a higher chance of having a stroke. You can help prevent a stroke by eating healthy, exercising, not smoking, and managing any medical conditions you have. Stroke is a leading cause of functional impairment. Primary prevention is particularly important because a majority of strokes are first-time events. Stroke changes the lives of not only those who experience a stroke but also their family and other caregivers. How can this condition affect me? A stroke is a medical emergency and should be treated right away. A stroke can lead to brain damage and can sometimes be life-threatening. If a person gets medical treatment right away, there is a better chance of surviving and recovering from a stroke. What can increase my risk? The following medical  conditions may increase your risk of a stroke: Cardiovascular disease. High blood pressure (hypertension). Diabetes. High cholesterol. Sickle cell disease. Blood clotting disorders (hypercoagulable state). Obesity. Sleep disorders (obstructive sleep apnea). Other risk factors include: Being older than age 29. Having a history of blood clots, stroke, or mini-stroke (transient ischemic attack, TIA). Genetic factors, such as race, ethnicity, or a family history of stroke. Smoking cigarettes or using other tobacco products. Taking birth control pills, especially if you also use tobacco. Heavy use of alcohol or drugs, especially cocaine and methamphetamine. Physical inactivity. What actions can I take to prevent this? Manage your health conditions High cholesterol levels. Eating a healthy diet is important for preventing high cholesterol. If cholesterol cannot be managed through diet alone, you may need to take medicines. Take any prescribed medicines to control your cholesterol as told by your health care provider. Hypertension. To reduce your risk of stroke, try to keep your blood pressure below 130/80. Eating a healthy diet and exercising regularly are important for controlling blood pressure. If these steps are not enough to manage your blood pressure, you may need to take medicines. Take any prescribed medicines to control hypertension  as told by your health care provider. Ask your health care provider if you should monitor your blood pressure at home. Have your blood pressure checked every year, even if your blood pressure is normal. Blood pressure increases with age and some medical conditions. Diabetes. Eating a healthy diet and exercising regularly are important parts of managing your blood sugar (glucose). If your blood sugar cannot be managed through diet and exercise, you may need to take medicines. Take any prescribed medicines to control your diabetes as told by your health  care provider. Get evaluated for obstructive sleep apnea. Talk to your health care provider about getting a sleep evaluation if you snore a lot or have excessive sleepiness. Make sure that any other medical conditions you have, such as atrial fibrillation or atherosclerosis, are managed. Nutrition Follow instructions from your health care provider about what to eat or drink to help manage your health condition. These instructions may include: Reducing your daily calorie intake. Limiting how much salt (sodium) you use to 1,500 milligrams (mg) each day. Using only healthy fats for cooking, such as olive oil, canola oil, or sunflower oil. Eating healthy foods. You can do this by: Choosing foods that are high in fiber, such as whole grains, and fresh fruits and vegetables. Eating at least 5 servings of fruits and vegetables a day. Try to fill one-half of your plate with fruits and vegetables at each meal. Choosing lean protein foods, such as lean cuts of meat, poultry without skin, fish, tofu, beans, and nuts. Eating low-fat dairy products. Avoiding foods that are high in sodium. This can help lower blood pressure. Avoiding foods that have saturated fat, trans fat, and cholesterol. This can help prevent high cholesterol. Avoiding processed and prepared foods. Counting your daily carbohydrate intake.  Lifestyle If you drink alcohol: Limit how much you have to: 0-1 drink a day for women who are not pregnant. 0-2 drinks a day for men. Know how much alcohol is in your drink. In the [place] one drink equals one 12 oz bottle of beer ( ), one 5 oz glass of wine ( ), or one 1 oz glass of hard liquor (44mL). Do not use any products that contain nicotine or tobacco. These products include cigarettes, chewing tobacco, and vaping devices, such as e-cigarettes. If you need help quitting, ask your health care provider. Avoid secondhand smoke. Do not use drugs. Activity  Try to stay at a healthy  weight. Get at least 30 minutes of exercise on most days, such as: Fast walking. Biking. Swimming. Medicines Take over-the-counter and prescription medicines only as told by your health care provider. Aspirin or blood thinners (antiplatelets or anticoagulants) may be recommended to reduce your risk of forming blood clots that can lead to stroke. Avoid taking birth control pills. Talk to your health care provider about the risks of taking birth control pills if: You are over 50 years old. You smoke. You get very bad headaches. You have had a blood clot. Where to find more information American Stroke Association: www.strokeassociation.org Get help right away if: You or a loved one has any symptoms of a stroke. "BE FAST" is an easy way to remember the main warning signs of a stroke: B - Balance. Signs are dizziness, sudden trouble walking, or loss of balance. E - Eyes. Signs are trouble seeing or a sudden change in vision. F - Face. Signs are sudden weakness or numbness of the face, or the face or eyelid drooping on one side. A - Arms.  Signs are weakness or numbness in an arm. This happens suddenly and usually on one side of the body. S - Speech. Signs are sudden trouble speaking, slurred speech, or trouble understanding what people say. T - Time. Time to call emergency services. Write down what time symptoms started. You or a loved one has other signs of a stroke, such as: A sudden, severe headache with no known cause. Nausea or vomiting. Seizure. These symptoms may represent a serious problem that is an emergency. Do not wait to see if the symptoms will go away. Get medical help right away. Call your local emergency services (911 in the [place] Do not drive yourself to the hospital. Summary You can help to prevent a stroke by eating healthy, exercising, not smoking, limiting alcohol intake, and managing any medical conditions you may have. Do not use any products that contain nicotine or  tobacco. These include cigarettes, chewing tobacco, and vaping devices, such as e-cigarettes. If you need help quitting, ask your health care provider. Remember "BE FAST" for warning signs of a stroke. Get help right away if you or a loved one has any of these signs. This information is not intended to replace advice given to you by your health care provider. Make sure you discuss any questions you have with your health care provider. Document Revised: 09/05/2019 Document Reviewed: 09/23/2019 Elsevier Patient Education  2023 [employer].

## 2022-06-28 ENCOUNTER — Telehealth: Payer: Self-pay

## 2022-06-28 NOTE — Telephone Encounter (Signed)
Called [employer] patient assistance company to follow up on patients application status.   Approval was pending due to missing patient signature date and needing a new rx.   I see medication is currently stopped. Enrollment still good if patient needs to continue. Please send new rx to [facility] (added to patients pharmacy list).

## 2022-06-30 DIAGNOSIS — Z905 Acquired absence of kidney: Secondary | ICD-10-CM | POA: Diagnosis not present

## 2022-06-30 DIAGNOSIS — I129 Hypertensive chronic kidney disease with stage 1 through stage 4 chronic kidney disease, or unspecified chronic kidney disease: Secondary | ICD-10-CM | POA: Diagnosis not present

## 2022-06-30 DIAGNOSIS — E1122 Type 2 diabetes mellitus with diabetic chronic kidney disease: Secondary | ICD-10-CM | POA: Diagnosis not present

## 2022-06-30 DIAGNOSIS — E1129 Type 2 diabetes mellitus with other diabetic kidney complication: Secondary | ICD-10-CM | POA: Diagnosis not present

## 2022-06-30 DIAGNOSIS — R809 Proteinuria, unspecified: Secondary | ICD-10-CM | POA: Diagnosis not present

## 2022-06-30 DIAGNOSIS — N189 Chronic kidney disease, unspecified: Secondary | ICD-10-CM | POA: Diagnosis not present

## 2022-06-30 DIAGNOSIS — N19 Unspecified kidney failure: Secondary | ICD-10-CM | POA: Diagnosis not present

## 2022-06-30 DIAGNOSIS — E211 Secondary hyperparathyroidism, not elsewhere classified: Secondary | ICD-10-CM | POA: Diagnosis not present

## 2022-07-05 DIAGNOSIS — Z794 Long term (current) use of insulin: Secondary | ICD-10-CM

## 2022-07-05 DIAGNOSIS — N183 Chronic kidney disease, stage 3 unspecified: Secondary | ICD-10-CM

## 2022-07-05 DIAGNOSIS — E1122 Type 2 diabetes mellitus with diabetic chronic kidney disease: Secondary | ICD-10-CM

## 2022-07-06 DIAGNOSIS — G43B0 Ophthalmoplegic migraine, not intractable: Secondary | ICD-10-CM | POA: Diagnosis not present

## 2022-07-06 DIAGNOSIS — E119 Type 2 diabetes mellitus without complications: Secondary | ICD-10-CM | POA: Diagnosis not present

## 2022-07-06 DIAGNOSIS — H40023 Open angle with borderline findings, high risk, bilateral: Secondary | ICD-10-CM | POA: Diagnosis not present

## 2022-07-06 DIAGNOSIS — H25813 Combined forms of age-related cataract, bilateral: Secondary | ICD-10-CM | POA: Diagnosis not present

## 2022-07-06 DIAGNOSIS — H43812 Vitreous degeneration, left eye: Secondary | ICD-10-CM | POA: Diagnosis not present

## 2022-07-07 DIAGNOSIS — E1122 Type 2 diabetes mellitus with diabetic chronic kidney disease: Secondary | ICD-10-CM | POA: Diagnosis not present

## 2022-07-07 DIAGNOSIS — R809 Proteinuria, unspecified: Secondary | ICD-10-CM | POA: Diagnosis not present

## 2022-07-07 DIAGNOSIS — E1129 Type 2 diabetes mellitus with other diabetic kidney complication: Secondary | ICD-10-CM | POA: Diagnosis not present

## 2022-07-07 DIAGNOSIS — N189 Chronic kidney disease, unspecified: Secondary | ICD-10-CM | POA: Diagnosis not present

## 2022-07-21 ENCOUNTER — Telehealth: Payer: Self-pay | Admitting: Family Medicine

## 2022-07-21 ENCOUNTER — Encounter: Payer: Self-pay | Admitting: Family Medicine

## 2022-07-21 ENCOUNTER — Other Ambulatory Visit: Payer: Self-pay | Admitting: Family Medicine

## 2022-07-21 ENCOUNTER — Ambulatory Visit (INDEPENDENT_AMBULATORY_CARE_PROVIDER_SITE_OTHER): Payer: Medicare HMO | Admitting: Family Medicine

## 2022-07-21 VITALS — BP 125/64 | HR 65 | Temp 98.5°F | Ht 65.0 in | Wt 220.0 lb

## 2022-07-21 DIAGNOSIS — I152 Hypertension secondary to endocrine disorders: Secondary | ICD-10-CM

## 2022-07-21 DIAGNOSIS — E1165 Type 2 diabetes mellitus with hyperglycemia: Secondary | ICD-10-CM | POA: Diagnosis not present

## 2022-07-21 DIAGNOSIS — R062 Wheezing: Secondary | ICD-10-CM

## 2022-07-21 DIAGNOSIS — E1159 Type 2 diabetes mellitus with other circulatory complications: Secondary | ICD-10-CM

## 2022-07-21 DIAGNOSIS — Z794 Long term (current) use of insulin: Secondary | ICD-10-CM

## 2022-07-21 DIAGNOSIS — J410 Simple chronic bronchitis: Secondary | ICD-10-CM

## 2022-07-21 LAB — BAYER DCA HB A1C WAIVED: HB A1C (BAYER DCA - WAIVED): 8.1 % — ABNORMAL HIGH (ref 4.8–5.6)

## 2022-07-21 MED ORDER — FREESTYLE LIBRE 3 SENSOR MISC
2.0000 | 12 refills | Status: AC
Start: 2022-07-21 — End: 2022-08-18

## 2022-07-21 MED ORDER — BASAGLAR KWIKPEN 100 UNIT/ML ~~LOC~~ SOPN
37.0000 [IU] | PEN_INJECTOR | Freq: Every day | SUBCUTANEOUS | 3 refills | Status: DC
Start: 2022-07-21 — End: 2022-07-22

## 2022-07-21 MED ORDER — HYDROCHLOROTHIAZIDE 25 MG PO TABS
25.0000 mg | ORAL_TABLET | Freq: Every day | ORAL | 1 refills | Status: DC
Start: 2022-07-21 — End: 2022-12-15

## 2022-07-21 MED ORDER — ATENOLOL 100 MG PO TABS
100.0000 mg | ORAL_TABLET | Freq: Every morning | ORAL | 1 refills | Status: DC
Start: 2022-07-21 — End: 2023-01-17

## 2022-07-21 MED ORDER — FREESTYLE LIBRE READER DEVI
1.0000 [IU] | 0 refills | Status: DC
Start: 2022-07-21 — End: 2022-07-22

## 2022-07-21 MED ORDER — AMLODIPINE BESYLATE 10 MG PO TABS
10.0000 mg | ORAL_TABLET | Freq: Every day | ORAL | 1 refills | Status: DC
Start: 2022-07-21 — End: 2023-01-17

## 2022-07-21 NOTE — Progress Notes (Signed)
Acute Office Visit  Subjective:  Patient ID: Brooke [name], female    DOB: 1955/07/03, 67 y.o.   MRN: [id number]  Chief Complaint  Patient presents with   Medical Management of Chronic Issues   HPI Patient is in today for follow up of diabetes  Type 2 Diabetes with renal involvement  Glucometer: Rely On.  Has trialed the [place] before.   High at home: >200; Low at home: 71, Taking medication(s): Trulicity - tolerating well.  Basaglar and Humalog  Hypoglycemic episode: happens rarely, twice since last visit. Resolved easily with eating. Symptoms of shaking and anxiety  States that she is gaining weight. Is trying to eat small meals per day, but is snacking at night. Is not active due to her knees.   Last eye exam: 07/06/22 - requested records  Last foot exam: 06/23/22 Last A1c:  Lab Results  Component Value Date   HGBA1C 8.1 (H) 07/21/2022   Nephropathy screen indicated?: completed with Nephrology  Last flu, zoster and/or pneumovax:  Immunization History  Administered Date(s) Administered   Fluad Quad(high Dose 65+) 03/13/2019, 11/28/2019, 12/10/2020   PFIZER(Purple Top)SARS-COV-2 Vaccination 04/12/2019, 05/03/2019, 01/14/2020   ROS: Denies dizziness, LOC, polyuria, polydipsia, unintended weight loss/, foot ulcerations, numbness or tingling in extremities, shortness of breath or chest pain.  ROS As per HPI   Objective:  BP 125/64   Pulse 65   Temp 98.5 F (36.9 C)   Ht 5\' 5"  (1.651 m)   Wt 220 lb (99.8 kg)   LMP 03/16/2012   SpO2 97%   BMI 36.61 kg/m   Physical Exam Constitutional:      General: She is not in acute distress.    Appearance: Normal appearance. She is not ill-appearing, toxic-appearing or diaphoretic.  Cardiovascular:     Rate and Rhythm: Normal rate.     Pulses: Normal pulses.     Heart sounds: Normal heart sounds. No murmur heard.    No gallop.  Pulmonary:     Effort: Pulmonary effort is normal. No respiratory distress.     Breath sounds:  Decreased air movement present. No stridor. Examination of the left-upper field reveals wheezing. Examination of the left-middle field reveals wheezing. Examination of the left-lower field reveals wheezing. Wheezing present. No rhonchi or rales.  Skin:    General: Skin is warm.     Capillary Refill: Capillary refill takes less than 2 seconds.  Neurological:     General: No focal deficit present.     Mental Status: She is alert and oriented to person, place, and time. Mental status is at baseline.     Motor: No weakness.  Psychiatric:        Mood and Affect: Mood normal.        Behavior: Behavior normal.        Thought Content: Thought content normal.        Judgment: Judgment normal.       07/21/2022    1:35 PM 06/23/2022    1:16 PM 04/25/2022   12:04 PM  Depression screen PHQ 2/9  Decreased Interest 0 0 0  Down, Depressed, Hopeless 0 0 0  PHQ - 2 Score 0 0 0  Altered sleeping 0 0   Tired, decreased energy 0 0   Change in appetite 0 0   Feeling bad or failure about yourself  0 0   Trouble concentrating 0 0   Moving slowly or fidgety/restless 0 0   Suicidal thoughts 0 0   PHQ-9 Score  0 0   Difficult doing work/chores Not difficult at all Not difficult at all       07/21/2022    1:36 PM 06/23/2022    1:16 PM 01/26/2022    2:05 PM 12/27/2021   11:27 AM  GAD 7 : Generalized Anxiety Score  Nervous, Anxious, on Edge 0 0 0 0  Control/stop worrying 0 0 0 0  Worry too much - different things 0 0 0 0  Trouble relaxing 0 0 0 0  Restless 0 0 0 0  Easily annoyed or irritable 0 0 0 0  Afraid - awful might happen 0 0 0 0  Total GAD 7 Score 0 0 0 0  Anxiety Difficulty Not difficult at all Not difficult at all Not difficult at all Not difficult at all   Assessment & Plan:  1. Type 2 diabetes mellitus with hyperglycemia, with long-term current use of insulin (HCC) Not at goal. A1C 8.1% in office today.  Consulted with Vanice Sarah in office today.  Increase glargine to 37 units  nightly. Libre ordered as below for assistance with continuous glucose monitoring. Patient has trialed it in the past and verbalized understanding of how to use device. Patient is on max dose of Trulicity. We are unable to start additional medications at this time due to renal function. Encouraged patient to be consistent with Trulicity dosing as she reported that she often forgets to take it and misses doses. Follow up planned with [name] next week.  - Bayer DCA Hb A1c Waived - Insulin Glargine (BASAGLAR KWIKPEN) 100 UNIT/ML; Inject 37 Units into the skin at bedtime.  Dispense: 45 mL; Refill: 3 - Continuous Glucose Sensor (FREESTYLE LIBRE 3 SENSOR) MISC; 2 each by Does not apply route as directed for 28 days. Apply to subcutaneous tissue once every two weeks.  Dispense: 2 each; Refill: 12 - Continuous Glucose Receiver (FREESTYLE LIBRE READER) DEVI; 1 Units by Does not apply route as directed.  Dispense: 1 each; Refill: 0  2. Simple chronic bronchitis (HCC) 3. Wheezing Wheezing on exam today. Patient states that she is not using symbicort daily, but as needed. Provided patient with sample of Breztri and referred her to pulm as below for PFTs. Xray not available in office today. No other symptoms of active bronchitis exacerbation such as cough or increased sputum production. Patient did have an abdominal CT that demonstrated results as below.  - Ambulatory referral to Pulmonology - Sample Breztri   CT Abdomen WO contrast 03/26/21 IMPRESSION: 1. The exophytic right kidney lower pole solid enhancing mass does not demonstrate macroscopic adipose tissue, and remains concerning for a renal cell carcinoma. 2.  Aortic Atherosclerosis (ICD10-I70.0).  Coronary atherosclerosis. 3. 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  Patient has US renal ordered. Will follow up with patient to ensure that she receives imaging as recommended.   4. Hypertension associated with diabetes (HCC) Refills as below.  Well controlled on current regiment.  - amLODipine (NORVASC) 10 MG tablet; Take 1 tablet (10 mg total) by mouth daily.  Dispense: 90 tablet; Refill: 1 - atenolol (TENORMIN) 100 MG tablet; Take 1 tablet (100 mg total) by mouth every morning.  Dispense: 90 tablet; Refill: 1 - hydrochlorothiazide (HYDRODIURIL) 25 MG tablet; Take 1 tablet (25 mg total) by mouth daily.  Dispense: 90 tablet; Refill: 1  The above assessment and management plan was discussed with the patient. The patient verbalized understanding of and has agreed to the management plan using shared-decision making. Patient is aware to  call the clinic if they develop any new symptoms or if symptoms fail to improve or worsen. Patient is aware when to return to the clinic for a follow-up visit. Patient educated on when it is appropriate to go to the emergency department.   Return in about 3 months (around 10/21/2022) for Chronic Condition Follow up.  Neale Burly, DNP-FNP [facility] [address] [place], [place] [address] [phone]

## 2022-07-22 MED ORDER — FREESTYLE LIBRE 3 READER DEVI: 1.0000 | Freq: Once | 0 refills | Status: AC

## 2022-07-22 NOTE — Telephone Encounter (Signed)
  Name from pharmacy: BASAGLAR 100UNIT    INJ   Pharmacy comment: NDC not covered by insurance.  lantus solostar, toujeo 300, tresiba are covered. please advise.

## 2022-07-22 NOTE — Progress Notes (Signed)
Fax from [place] pharmacy needing [employer] reader, sent in [place] 3 reader.

## 2022-07-22 NOTE — Addendum Note (Signed)
Addended by: Julious Payer D on: 07/22/2022 04:15 PM   Modules accepted: Orders

## 2022-07-29 ENCOUNTER — Telehealth: Payer: Self-pay

## 2022-07-29 ENCOUNTER — Institutional Professional Consult (permissible substitution): Payer: Medicare HMO | Admitting: Internal Medicine

## 2022-07-29 NOTE — Telephone Encounter (Signed)
Brooke [name] (Key: [id number]) PA Case ID #: [id number] Rx #: [id number] Need Help? Call us at [phone] Status sent iconSent to Plan today Drug FreeStyle Libre 3 Sensor ePA cloud logo Form [employer] Electronic PA Form

## 2022-07-29 NOTE — Telephone Encounter (Signed)
[name] (Key: [id number]) PA Case ID #: [id number] Rx #: [id number] Need Help? Call us at [phone] Status sent iconSent to Plan today Drug Basaglar KwikPen 100UNIT/ML pen-injectors ePA cloud logo Form [employer] Electronic PA Form Original Claim Info 569,MR NON-FORMULARY DRUG; CALL [phone] SUPPLY IS MORE THAN ALLOWED BY PLAN; SUBMIT VALUE < OR = 90Try LANTUS U-100 INSULIN,LANTUS SOLOSTAR U-100 INSULIN,TOUJEO SOLOSTAR U-300 INSULIN,TOUJEO MAX

## 2022-08-02 ENCOUNTER — Other Ambulatory Visit (HOSPITAL_COMMUNITY): Payer: Self-pay

## 2022-08-02 NOTE — Telephone Encounter (Signed)
Patient Advocate Encounter  Prior Authorization for [employer] 3 Sensor has been approved with [employer].    PA# [id number] Effective dates: 03/07/22 through 03/07/23  Per [facility] test claim, copay for 28 days supply is $25.65

## 2022-08-02 NOTE — Telephone Encounter (Signed)
Pharmacy Patient Advocate Encounter  Received notification from [facility] that the request for prior authorization for Basaglar KwikPen 100UNIT/ML pen-injectors has been denied due to The drug you asked for is not listed in your preferred drug list (formulary).        Please be advised we currently do not have a Pharmacist to review denials, therefore you will need to process appeals accordingly as needed. Thanks for your support at this time.   You may call [phone] or fax [phone], to appeal. E-appeal is also available (Key: [id number])

## 2022-08-16 DIAGNOSIS — I700 Atherosclerosis of aorta: Secondary | ICD-10-CM | POA: Diagnosis not present

## 2022-08-16 DIAGNOSIS — N200 Calculus of kidney: Secondary | ICD-10-CM | POA: Diagnosis not present

## 2022-08-16 DIAGNOSIS — K3189 Other diseases of stomach and duodenum: Secondary | ICD-10-CM | POA: Diagnosis not present

## 2022-08-16 DIAGNOSIS — C641 Malignant neoplasm of right kidney, except renal pelvis: Secondary | ICD-10-CM | POA: Diagnosis not present

## 2022-08-29 DIAGNOSIS — E041 Nontoxic single thyroid nodule: Secondary | ICD-10-CM | POA: Diagnosis not present

## 2022-08-29 DIAGNOSIS — Z905 Acquired absence of kidney: Secondary | ICD-10-CM | POA: Diagnosis not present

## 2022-08-29 DIAGNOSIS — N281 Cyst of kidney, acquired: Secondary | ICD-10-CM | POA: Diagnosis not present

## 2022-08-29 DIAGNOSIS — C641 Malignant neoplasm of right kidney, except renal pelvis: Secondary | ICD-10-CM | POA: Diagnosis not present

## 2022-09-01 ENCOUNTER — Ambulatory Visit (INDEPENDENT_AMBULATORY_CARE_PROVIDER_SITE_OTHER): Payer: Medicare HMO | Admitting: Pharmacist

## 2022-09-01 ENCOUNTER — Encounter: Payer: Self-pay | Admitting: Pharmacist

## 2022-09-01 VITALS — BP 125/74 | HR 65

## 2022-09-01 DIAGNOSIS — Z794 Long term (current) use of insulin: Secondary | ICD-10-CM

## 2022-09-01 DIAGNOSIS — E1165 Type 2 diabetes mellitus with hyperglycemia: Secondary | ICD-10-CM

## 2022-09-01 MED ORDER — BASAGLAR KWIKPEN 100 UNIT/ML ~~LOC~~ SOPN
37.0000 [IU] | PEN_INJECTOR | Freq: Every day | SUBCUTANEOUS | 3 refills | Status: DC
Start: 2022-09-01 — End: 2023-05-23

## 2022-09-01 MED ORDER — INSULIN LISPRO (1 UNIT DIAL) 100 UNIT/ML (KWIKPEN)
10.0000 [IU] | PEN_INJECTOR | SUBCUTANEOUS | 5 refills | Status: DC
Start: 2022-09-01 — End: 2023-05-23

## 2022-09-01 MED ORDER — FREESTYLE LIBRE 3 READER DEVI
1.0000 | 1 refills | Status: DC
Start: 2022-09-01 — End: 2023-10-11

## 2022-09-01 MED ORDER — FREESTYLE LIBRE 3 SENSOR MISC
5 refills | Status: DC
Start: 2022-09-01 — End: 2022-12-21

## 2022-09-01 NOTE — Progress Notes (Signed)
09/01/2022 Name: Brooke [name] MRN: [id number] DOB: [birth year]  Type 2 Diabetes  Brooke [name] is a 67 y.o. year old female who presented for a telephone visit.   They were referred to the pharmacist by their PCP for assistance in managing diabetes, medication access, and CGM training .    Subjective:  Care Team: Primary Care Provider: [name], FNP ; Next Scheduled Visit: 10/25/22   Medication Access/Adherence  Patient reports affordability concerns with their medications: Yes  Patient reports access/transportation concerns to their pharmacy: No  Patient reports adherence concerns with their medications:  No    Diabetes:  Current medications: Basaglar, Humalog, Trulicity Medications tried in the past: empagliflozin, dapagliflozin, metformin (all were held/stopped due to GFR 15/nephro) Denies personal and family history of Medullary thyroid cancer (MTC)   Current glucose readings: FBG<130 Using libre 3 --phone not compatible/needs reader  -Patient is testing blood sugar 6 times daily -Patient is injecting insulin 4 or more times daily -She would greatly benefit from a continuous glucose monitoring system (I.e. libre or dexcom)  Patient denies hypoglycemic s/sx including dizziness, shakiness, sweating. Patient  denies hyperglycemic symptoms including polyuria, polydipsia, polyphagia, nocturia, neuropathy, blurred vision.  Current meal patterns:  -follow renal/T2DM diet per nephro  Current physical activity: encouraged as able  Current medication access support:  -BASAGLAR, HUMALOG & TRULICITY via [facility] [facility]; meds ship to pt home; ESCRIBE TO [facility]; approved UNTIL 03/07/23 ALL shipments (new, refill, dose changes can take up to 4-6 weeks)    Objective:  Lab Results  Component Value Date   HGBA1C 8.1 (H) 07/21/2022    Lab Results  Component Value Date   CREATININE 3.22 (HH) 12/27/2021   BUN 37 (H) 12/27/2021   NA 140 12/27/2021    K 4.7 12/27/2021   CL 106 12/27/2021   CO2 18 (L) 12/27/2021    Lab Results  Component Value Date   CHOL 202 (H) 04/12/2022   HDL 38 (L) 04/12/2022   LDLCALC 119 (H) 04/12/2022   TRIG 254 (H) 04/12/2022   CHOLHDL 5.3 (H) 04/12/2022    Medications Reviewed Today     Reviewed by [name], FNP (Family Nurse Practitioner) on 07/21/22 at 1350  Med List Status: <None>   Medication Order Taking? Sig Documenting Provider Last Dose Status Informant  acetaminophen (TYLENOL) 500 MG tablet [id number] Yes Take 1,000 mg by mouth every 8 (eight) hours as needed for moderate pain. [provider] Taking Active Self  albuterol (VENTOLIN HFA) 108 (90 Base) MCG/ACT inhaler [id number] Yes Inhale 2 puffs into the lungs every 6 (six) hours as needed for wheezing or shortness of breath. [name], FNP Taking Active Self  amLODipine (NORVASC) 10 MG tablet [id number] Yes Take 1 tablet (10 mg total) by mouth daily. Daryll Drown, NP Taking Active   atenolol (TENORMIN) 100 MG tablet [id number] Yes Take 1 tablet (100 mg total) by mouth every morning. Daryll Drown, NP Taking Active   atorvastatin (LIPITOR) 20 MG tablet [id number] Yes Take 1 tablet (20 mg total) by mouth daily. [name], FNP Taking Active   budesonide-formoterol [facility]) 160-4.5 MCG/ACT inhaler [id number] Yes Inhale 2 puffs into the lungs 2 (two) times daily. [name], FNP Taking Active Self           Med Note Danella Maiers   Fri Apr 22, 2022  2:05 PM)    calcium acetate (PHOSLO) 667 MG tablet [id number] Yes Take by mouth. [provider]  Taking Active   Camphor-Menthol-Methyl Sal (SALONPAS) 3.03-12-08 % PTCH [id number] Yes Apply 1 patch topically daily as needed (pain). [provider] Taking Active Self  Cholecalciferol 25 MCG (1000 UT) capsule [id number] Yes Take 1,000 Units by mouth daily. [provider] Taking Active Self  docusate sodium (COLACE) 100 MG capsule  [id number] Yes Take 1 capsule (100 mg total) by mouth 2 (two) times daily. Harrie Foreman, PA-C Taking Active   Dulaglutide (TRULICITY) 4.5 MG/0.5ML SOPN [id number] Yes Inject 4.5 mg as directed once a week. [name], FNP Taking Active   hydrochlorothiazide (HYDRODIURIL) 25 MG tablet [id number] Yes Take 1 tablet (25 mg total) by mouth daily. Daryll Drown, NP Taking Active   Insulin Glargine [facility] KWIKPEN) 100 UNIT/ML [id number] Yes Inject 35 Units into the skin at bedtime. [name], FNP Taking Active   insulin lispro (HUMALOG KWIKPEN) 100 UNIT/ML KwikPen [id number] Yes Inject 10-15 Units into the skin See admin instructions. 10 units WITH BREAKFAST AND LUNCH, TAKE 15 units at [facility], Doralee Albino, FNP Taking Active   Insulin Pen Needle 31G X 6 MM MISC [id number] Yes Use to inject insulin 4 times daily as directed. DX: E11.65 [name], FNP Taking Active   lisinopril (ZESTRIL) 10 MG tablet [id number] Yes Take by mouth. [provider] Taking Active   Menthol, Topical Analgesic, (BIOFREEZE) 4 % GEL [id number] Yes Apply 1 application. topically daily as needed (pain). [provider] Taking Active Self  promethazine-dextromethorphan (PROMETHAZINE-DM) 6.25-15 MG/5ML syrup [id number] Yes Take 5 mLs by mouth 4 (four) times daily as needed. [provider] Taking Active   sodium bicarbonate 650 MG tablet [id number] Yes Take by mouth. [provider] Taking Active   Med List Note Danella Maiers [facility] 06/09/21 1043): trulicity, basaglar, humalog escribe to [facility] mail order symbicort-escribe to [facility] mail order jardiance-escribe to [employer] order               Assessment/Plan:  -Sent in [place] 3 RXs in again to [place]  -Patient will likely pick up this month -- copay $13-30/month -Stable on all other medications -Refills on insulins & trulicity escribed to [facility] ([facility]) -BP controlled -switched statin to  atorvastatin at last visits (better for renal)  Follow Up Plan: encouraged patient to reach out if she needs additional CGM training  Kieth Brightly, PharmD, BCACP Clinical Pharmacist, [facility]

## 2022-09-22 ENCOUNTER — Ambulatory Visit (INDEPENDENT_AMBULATORY_CARE_PROVIDER_SITE_OTHER): Payer: Medicare HMO | Admitting: *Deleted

## 2022-09-22 DIAGNOSIS — I129 Hypertensive chronic kidney disease with stage 1 through stage 4 chronic kidney disease, or unspecified chronic kidney disease: Secondary | ICD-10-CM | POA: Diagnosis not present

## 2022-09-22 DIAGNOSIS — N184 Chronic kidney disease, stage 4 (severe): Secondary | ICD-10-CM | POA: Diagnosis not present

## 2022-09-22 DIAGNOSIS — R809 Proteinuria, unspecified: Secondary | ICD-10-CM | POA: Diagnosis not present

## 2022-09-22 DIAGNOSIS — E1165 Type 2 diabetes mellitus with hyperglycemia: Secondary | ICD-10-CM

## 2022-09-22 DIAGNOSIS — E1159 Type 2 diabetes mellitus with other circulatory complications: Secondary | ICD-10-CM

## 2022-09-22 DIAGNOSIS — D638 Anemia in other chronic diseases classified elsewhere: Secondary | ICD-10-CM | POA: Diagnosis not present

## 2022-09-22 DIAGNOSIS — E1122 Type 2 diabetes mellitus with diabetic chronic kidney disease: Secondary | ICD-10-CM

## 2022-09-22 NOTE — Chronic Care Management (AMB) (Signed)
Chronic Care Management   CCM RN Visit Note  09/22/2022 Name: Brooke [name] MRN: [id number] DOB: [birth year]  Subjective: Brooke [name] is a 67 y.o. year old female who is a primary care patient of Brooke [name], Brooke Campi, FNP. The patient was referred to the Chronic Care Management team for assistance with care management needs subsequent to provider initiation of CCM services and plan of care.    Today's Visit:  Engaged with patient by telephone for follow up visit.        Goals Addressed             This Visit's Progress    CCM (CHRONIC KIDNEY DISEASE) EXPECTED OUTCOME: MONITOR, SELF-MANAGE AND REDUCE SYMPTOMS OF CHRONIC KIDNEY DISEASE       Current Barriers:  Knowledge Deficits related to CKD Chronic Disease Management support and education needs related to CKD No Advanced Directives in place- Pt declines Pt reports she is concerned about her kidneys and trying to keep blood sugar, blood pressure WNL to preserve kidney function, does not drink sugary drinks Pt states she continues to work on her diet, no new concerns reported today  Planned Interventions: Assessed the patient     understanding of chronic kidney disease    Evaluation of current treatment plan related to chronic kidney disease self management and patient's adherence to plan as established by provider      Reviewed medications with patient and discussed importance of compliance    Counseled on the importance of exercise goals with target of 150 minutes per week     Advised patient, providing education and rationale, to monitor blood pressure daily and record, calling PCP for findings outside established parameters    Discussed complications of poorly controlled blood pressure such as heart disease, stroke, circulatory complications, vision complications, kidney impairment, sexual dysfunction    Reviewed importance of staying active Reviewed low sodium diet and nutritious food choices  Symptom  Management: Take medications as prescribed   Attend all scheduled provider appointments Call pharmacy for medication refills 3-7 days in advance of running out of medications Attend church or other social activities Perform all self care activities independently  Perform IADL's (shopping, preparing meals, housekeeping, managing finances) independently Call provider office for new concerns or questions  Try to do some type of exercise daily- walking is good Continue to check blood pressure and keep a log Drink adequate fluids, preferably water  Follow Up Plan: Telephone follow up appointment with care management team member scheduled for:  11/28/22 at 215 pm       CCM (DIABETES) EXPECTED OUTCOME:  MONITOR, SELF-MANAGE AND REDUCE SYMPTOMS OF DIABETES       Current Barriers:  Knowledge Deficits related to Diabetes management Chronic Disease Management support and education needs related to Diabetes Financial Constraints.  No Advanced Directives in place- pt declines Pt reports she checks CBG daily with fasting ranges 80-180 with most readings under 130, no random readings reported Pt reports she tried to eat healthy and be mindful of carbohydrate intake at each meal and with snacks Pt reports recent AIC 8.1 on 07/21/22 Patient reports she now has CGM [employer] and needs refresher on how to "put this on and use" (pt had CGM in the past), states she will call today and make appointment with PharmD  Planned Interventions: Reviewed medications with patient and discussed importance of medication adherence;        Counseled on importance of regular laboratory monitoring as prescribed;  Advised patient, providing education and rationale, to check cbg twice daily and record        call provider for findings outside established parameters;       Review of patient status, including review of consultants reports, relevant laboratory and other test results, and medications completed;        Reviewed carbohydrate modified diet Reviewed importance of working with pharmacist and using CGM consistenly Reviewed plan of care with pt,  pt feels comfortable having 10-12 week outreaches from RN care manager  Symptom Management: Take medications as prescribed   Attend all scheduled provider appointments Call pharmacy for medication refills 3-7 days in advance of running out of medications Attend church or other social activities Perform all self care activities independently  Perform IADL's (shopping, preparing meals, housekeeping, managing finances) independently Call provider office for new concerns or questions  check blood sugar at prescribed times: twice daily check feet daily for cuts, sores or redness enter blood sugar readings and medication or insulin into daily log take the blood sugar log to all doctor visits take the blood sugar meter to all doctor visits trim toenails straight across fill half of plate with vegetables limit fast food meals to no more than 1 per week manage portion size prepare main meal at home 3 to 5 days each week read food labels for fat, fiber, carbohydrates and portion size set a realistic goal keep feet up while sitting wash and dry feet carefully every day wear comfortable, cotton socks Call today and make appointment with pharmacist for assistance with Freestyle Libre  Follow Up Plan: Telephone follow up appointment with care management team member scheduled for:   11/28/22 at 215 pm       CCM (HYPERTENSION) EXPECTED OUTCOME: MONITOR, SELF-MANAGE AND REDUCE SYMPTOMS OF HYPERTENSION       Current Barriers:  Knowledge Deficits related to Hypertension management Chronic Disease Management support and education needs related to Hypertension No Advanced Directives in place- pt declines Pt reports she lives with spouse, is independent in all aspects of her care, is trying to exercise now since her knees are feeling better,  tries to adhere to  healthy diet Pt reports she checks blood pressure on occasion and readings are usually good  Planned Interventions: Evaluation of current treatment plan related to hypertension self management and patient's adherence to plan as established by provider;   Reviewed medications with patient and discussed importance of compliance;  Counseled on the importance of exercise goals with target of 150 minutes per week Advised patient, providing education and rationale, to monitor blood pressure daily and record, calling PCP for findings outside established parameters;  Discussed complications of poorly controlled blood pressure such as heart disease, stroke, circulatory complications, vision complications, kidney impairment, sexual dysfunction;  Reviewed all upcoming scheduled appointments Reinforced low sodium diet  Symptom Management: Take medications as prescribed   Attend all scheduled provider appointments Call pharmacy for medication refills 3-7 days in advance of running out of medications Attend church or other social activities Perform all self care activities independently  Perform IADL's (shopping, preparing meals, housekeeping, managing finances) independently Call provider office for new concerns or questions  check blood pressure weekly choose a place to take my blood pressure (home, clinic or office, retail store) write blood pressure results in a log or diary learn about high blood pressure keep a blood pressure log take blood pressure log to all doctor appointments call doctor for signs and symptoms of high blood pressure  keep all doctor appointments take medications for blood pressure exactly as prescribed begin an exercise program report new symptoms to your doctor eat more whole grains, fruits and vegetables, lean meats and healthy fats Follow low sodium diet- read food labels Limit/ avoid fast food  Follow Up Plan: Telephone follow up appointment with care management  team member scheduled for:   11/28/22 at 215 pm          Plan:Telephone follow up appointment with care management team member scheduled for:  11/28/22 at 215 pm  Irving Shows [facility], BSN RN Case Manager [facility] [phone]

## 2022-09-22 NOTE — Patient Instructions (Signed)
Please call the care guide team at [phone] if you need to cancel or reschedule your appointment.   If you are experiencing a Mental Health or Behavioral Health Crisis or need someone to talk to, please call the Suicide and Crisis Lifeline: 988 call the [place] National Suicide Prevention Lifeline: 3038331632 or TTY: 352-047-1506 TTY (314)359-8701) to talk to a trained counselor call 1-800-273-TALK (toll free, 24 hour hotline) go to [facility] [address] [place] [phone]) call the [facility]: [phone] call 911   Following is a copy of the CCM Program Consent:  CCM service includes personalized support from designated clinical staff supervised by the physician, including individualized plan of care and coordination with other care providers 24/7 contact phone numbers for assistance for urgent and routine care needs. Service will only be billed when office clinical staff spend 20 minutes or more in a month to coordinate care. Only one practitioner may furnish and bill the service in a calendar month. The patient may stop CCM services at amy time (effective at the end of the month) by phone call to the office staff. The patient will be responsible for cost sharing (co-pay) or up to 20% of the service fee (after annual deductible is met)  Following is a copy of your full provider care plan:   Goals Addressed             This Visit's Progress    CCM (CHRONIC KIDNEY DISEASE) EXPECTED OUTCOME: MONITOR, SELF-MANAGE AND REDUCE SYMPTOMS OF CHRONIC KIDNEY DISEASE       Current Barriers:  Knowledge Deficits related to CKD Chronic Disease Management support and education needs related to CKD No Advanced Directives in place- Pt declines Pt reports she is concerned about her kidneys and trying to keep blood sugar, blood pressure WNL to preserve kidney function, does not drink sugary drinks Pt states she continues to work on  her diet, no new concerns reported today  Planned Interventions: Assessed the patient     understanding of chronic kidney disease    Evaluation of current treatment plan related to chronic kidney disease self management and patient's adherence to plan as established by provider      Reviewed medications with patient and discussed importance of compliance    Counseled on the importance of exercise goals with target of 150 minutes per week     Advised patient, providing education and rationale, to monitor blood pressure daily and record, calling PCP for findings outside established parameters    Discussed complications of poorly controlled blood pressure such as heart disease, stroke, circulatory complications, vision complications, kidney impairment, sexual dysfunction    Reviewed importance of staying active Reviewed low sodium diet and nutritious food choices  Symptom Management: Take medications as prescribed   Attend all scheduled provider appointments Call pharmacy for medication refills 3-7 days in advance of running out of medications Attend church or other social activities Perform all self care activities independently  Perform IADL's (shopping, preparing meals, housekeeping, managing finances) independently Call provider office for new concerns or questions  Try to do some type of exercise daily- walking is good Continue to check blood pressure and keep a log Drink adequate fluids, preferably water  Follow Up Plan: Telephone follow up appointment with care management team member scheduled for:  11/28/22 at 215 pm       CCM (DIABETES) EXPECTED OUTCOME:  MONITOR, SELF-MANAGE AND REDUCE SYMPTOMS OF DIABETES       Current  Barriers:  Knowledge Deficits related to Diabetes management Chronic Disease Management support and education needs related to Diabetes Financial Constraints.  No Advanced Directives in place- pt declines Pt reports she checks CBG daily with fasting ranges  80-180 with most readings under 130, no random readings reported Pt reports she tried to eat healthy and be mindful of carbohydrate intake at each meal and with snacks Pt reports recent AIC 8.1 on 07/21/22 Patient reports she now has CGM [employer] and needs refresher on how to "put this on and use" (pt had CGM in the past), states she will call today and make appointment with PharmD  Planned Interventions: Reviewed medications with patient and discussed importance of medication adherence;        Counseled on importance of regular laboratory monitoring as prescribed;        Advised patient, providing education and rationale, to check cbg twice daily and record        call provider for findings outside established parameters;       Review of patient status, including review of consultants reports, relevant laboratory and other test results, and medications completed;       Reviewed carbohydrate modified diet Reviewed importance of working with pharmacist and using CGM consistenly Reviewed plan of care with pt,  pt feels comfortable having 10-12 week outreaches from RN care manager  Symptom Management: Take medications as prescribed   Attend all scheduled provider appointments Call pharmacy for medication refills 3-7 days in advance of running out of medications Attend church or other social activities Perform all self care activities independently  Perform IADL's (shopping, preparing meals, housekeeping, managing finances) independently Call provider office for new concerns or questions  check blood sugar at prescribed times: twice daily check feet daily for cuts, sores or redness enter blood sugar readings and medication or insulin into daily log take the blood sugar log to all doctor visits take the blood sugar meter to all doctor visits trim toenails straight across fill half of plate with vegetables limit fast food meals to no more than 1 per week manage portion size prepare  main meal at home 3 to 5 days each week read food labels for fat, fiber, carbohydrates and portion size set a realistic goal keep feet up while sitting wash and dry feet carefully every day wear comfortable, cotton socks Call today and make appointment with pharmacist for assistance with Freestyle Libre  Follow Up Plan: Telephone follow up appointment with care management team member scheduled for:   11/28/22 at 215 pm       CCM (HYPERTENSION) EXPECTED OUTCOME: MONITOR, SELF-MANAGE AND REDUCE SYMPTOMS OF HYPERTENSION       Current Barriers:  Knowledge Deficits related to Hypertension management Chronic Disease Management support and education needs related to Hypertension No Advanced Directives in place- pt declines Pt reports she lives with spouse, is independent in all aspects of her care, is trying to exercise now since her knees are feeling better,  tries to adhere to healthy diet Pt reports she checks blood pressure on occasion and readings are usually good  Planned Interventions: Evaluation of current treatment plan related to hypertension self management and patient's adherence to plan as established by provider;   Reviewed medications with patient and discussed importance of compliance;  Counseled on the importance of exercise goals with target of 150 minutes per week Advised patient, providing education and rationale, to monitor blood pressure daily and record, calling PCP for findings outside established parameters;  Discussed complications of poorly controlled blood pressure such as heart disease, stroke, circulatory complications, vision complications, kidney impairment, sexual dysfunction;  Reviewed all upcoming scheduled appointments Reinforced low sodium diet  Symptom Management: Take medications as prescribed   Attend all scheduled provider appointments Call pharmacy for medication refills 3-7 days in advance of running out of medications Attend church or other social  activities Perform all self care activities independently  Perform IADL's (shopping, preparing meals, housekeeping, managing finances) independently Call provider office for new concerns or questions  check blood pressure weekly choose a place to take my blood pressure (home, clinic or office, retail store) write blood pressure results in a log or diary learn about high blood pressure keep a blood pressure log take blood pressure log to all doctor appointments call doctor for signs and symptoms of high blood pressure keep all doctor appointments take medications for blood pressure exactly as prescribed begin an exercise program report new symptoms to your doctor eat more whole grains, fruits and vegetables, lean meats and healthy fats Follow low sodium diet- read food labels Limit/ avoid fast food  Follow Up Plan: Telephone follow up appointment with care management team member scheduled for:   11/28/22 at 215 pm          Patient verbalizes understanding of instructions and care plan provided today and agrees to view in MyChart. Active MyChart status and patient understanding of how to access instructions and care plan via MyChart confirmed with patient.  Telephone follow up appointment with care management team member scheduled for:  11/28/22 at 215 pm

## 2022-09-29 ENCOUNTER — Ambulatory Visit: Payer: Medicare HMO

## 2022-09-29 ENCOUNTER — Telehealth: Payer: Self-pay | Admitting: Family Medicine

## 2022-09-29 DIAGNOSIS — E1165 Type 2 diabetes mellitus with hyperglycemia: Secondary | ICD-10-CM

## 2022-09-29 NOTE — Telephone Encounter (Signed)
Pt states that the reader is not doing anything. Triage appt made for 7/26. Pt is going to try and cut reader off and see if it starts to work. Pt will call and cancel nurse visit if it starts to work.

## 2022-09-29 NOTE — Telephone Encounter (Signed)
Patient says her Brooke [name] is not reading - she was here this morning 7/25 to have it put on

## 2022-09-29 NOTE — Progress Notes (Signed)
Patient here today to have Libre 3 sensor applied and new reader activated.  Placement of sensor demonstrated to patient and patient voiced understanding, reader activated.  Sensor applied to left upper arm.

## 2022-09-30 ENCOUNTER — Ambulatory Visit: Payer: Medicare HMO

## 2022-09-30 DIAGNOSIS — N184 Chronic kidney disease, stage 4 (severe): Secondary | ICD-10-CM | POA: Diagnosis not present

## 2022-09-30 DIAGNOSIS — N2581 Secondary hyperparathyroidism of renal origin: Secondary | ICD-10-CM | POA: Diagnosis not present

## 2022-09-30 DIAGNOSIS — E1129 Type 2 diabetes mellitus with other diabetic kidney complication: Secondary | ICD-10-CM | POA: Diagnosis not present

## 2022-09-30 DIAGNOSIS — R809 Proteinuria, unspecified: Secondary | ICD-10-CM | POA: Diagnosis not present

## 2022-10-04 ENCOUNTER — Ambulatory Visit: Payer: Medicare HMO

## 2022-10-05 DIAGNOSIS — N183 Chronic kidney disease, stage 3 unspecified: Secondary | ICD-10-CM

## 2022-10-05 DIAGNOSIS — E1159 Type 2 diabetes mellitus with other circulatory complications: Secondary | ICD-10-CM

## 2022-10-05 DIAGNOSIS — I152 Hypertension secondary to endocrine disorders: Secondary | ICD-10-CM

## 2022-10-05 DIAGNOSIS — E1165 Type 2 diabetes mellitus with hyperglycemia: Secondary | ICD-10-CM

## 2022-10-05 DIAGNOSIS — Z794 Long term (current) use of insulin: Secondary | ICD-10-CM

## 2022-10-05 DIAGNOSIS — E1122 Type 2 diabetes mellitus with diabetic chronic kidney disease: Secondary | ICD-10-CM

## 2022-10-20 ENCOUNTER — Other Ambulatory Visit: Payer: Medicare HMO

## 2022-10-20 ENCOUNTER — Other Ambulatory Visit: Payer: Self-pay | Admitting: Family Medicine

## 2022-10-20 DIAGNOSIS — E538 Deficiency of other specified B group vitamins: Secondary | ICD-10-CM

## 2022-10-20 DIAGNOSIS — E559 Vitamin D deficiency, unspecified: Secondary | ICD-10-CM | POA: Diagnosis not present

## 2022-10-20 DIAGNOSIS — R899 Unspecified abnormal finding in specimens from other organs, systems and tissues: Secondary | ICD-10-CM

## 2022-10-20 DIAGNOSIS — Z794 Long term (current) use of insulin: Secondary | ICD-10-CM | POA: Diagnosis not present

## 2022-10-20 DIAGNOSIS — E1165 Type 2 diabetes mellitus with hyperglycemia: Secondary | ICD-10-CM

## 2022-10-20 DIAGNOSIS — E785 Hyperlipidemia, unspecified: Secondary | ICD-10-CM | POA: Diagnosis not present

## 2022-10-20 DIAGNOSIS — E1169 Type 2 diabetes mellitus with other specified complication: Secondary | ICD-10-CM | POA: Diagnosis not present

## 2022-10-20 LAB — BAYER DCA HB A1C WAIVED: HB A1C (BAYER DCA - WAIVED): 7.7 % — ABNORMAL HIGH (ref 4.8–5.6)

## 2022-10-20 NOTE — Patient Instructions (Addendum)
Voltaren Gel  Lidocaine Patches  Tylenol   Our records indicate that you are due for your annual mammogram/breast imaging. While there is no way to prevent breast cancer, early detection provides the best opportunity for curing it. For women over the age of 29, the American Cancer Society recommends a yearly clinical breast exam and a yearly mammogram. These practices have saved thousands of lives. We need your help to ensure that you are receiving optimal medical care. Please call the imaging location that has done you previous mammograms. Please remember to list [place] as your primary care. This helps make sure we receive a report and can update your chart.  Below is the contact information for several local breast imaging centers. You may call the location that works best for you, and they will be happy to assistance in making you an appointment. You do not need an order for a regular screening mammogram. However, if you are having any problems or concerns with you breast area, please let your primary care provider know, and appropriate orders will be placed. Please let our office know if you have any questions or concerns. Or if you need information for another imaging center not on this list or outside of the area. We are commented to working with you on your health care journey.   The mobile unit/bus ([facility]) - they come twice a month to our location.  These appointments can be made through our office or by call The Breast Center  [facility]  [address] [place], [place] [address] Phone [phone]  [facility] Radiology Department  [address] [place], [place] [address] [phone]  [facility]  [phone] [address], [place] [address] [phone]  [facility]  [address] [place] [place] [address] [phone]  [facility]  [address] [place] [address] [phone]  [facility] [place]  [address] [place], [place] [address] [phone]  [facility] [address] [place], [place] [address] [phone]  [facility] [address] [place], [place] [address] [phone]  [facility] [place], [place] [address] [phone]

## 2022-10-21 LAB — ANEMIA PROFILE B
Basophils Absolute: 0.1 10*3/uL (ref 0.0–0.2)
Basos: 1 %
EOS (ABSOLUTE): 1 10*3/uL — ABNORMAL HIGH (ref 0.0–0.4)
Eos: 11 %
Ferritin: 26 ng/mL (ref 15–150)
Folate: 13.3 ng/mL (ref >3.0)
Hematocrit: 34.5 % (ref 34.0–46.6)
Hemoglobin: 10.9 g/dL — ABNORMAL LOW (ref 11.1–15.9)
Immature Grans (Abs): 0 10*3/uL (ref 0.0–0.1)
Immature Granulocytes: 0 %
Iron Saturation: 18 % (ref 15–55)
Iron: 53 ug/dL (ref 27–139)
Lymphocytes Absolute: 3 10*3/uL (ref 0.7–3.1)
Lymphs: 34 %
MCH: 26.2 pg — ABNORMAL LOW (ref 26.6–33.0)
MCHC: 31.6 g/dL (ref 31.5–35.7)
MCV: 83 fL (ref 79–97)
Monocytes Absolute: 0.5 10*3/uL (ref 0.1–0.9)
Monocytes: 6 %
Neutrophils Absolute: 4.1 10*3/uL (ref 1.4–7.0)
Neutrophils: 48 %
Platelets: 409 10*3/uL (ref 150–450)
RBC: 4.16 x10E6/uL (ref 3.77–5.28)
RDW: 14.2 % (ref 11.7–15.4)
Retic Ct Pct: 1.7 % (ref 0.6–2.6)
Total Iron Binding Capacity: 295 ug/dL (ref 250–450)
UIBC: 242 ug/dL (ref 118–369)
Vitamin B-12: 585 pg/mL (ref 232–1245)
WBC: 8.7 10*3/uL (ref 3.4–10.8)

## 2022-10-21 LAB — LIPID PANEL
Chol/HDL Ratio: 4.8 ratio — ABNORMAL HIGH (ref 0.0–4.4)
Cholesterol, Total: 159 mg/dL (ref 100–199)
HDL: 33 mg/dL — ABNORMAL LOW (ref >39)
LDL Chol Calc (NIH): 94 mg/dL (ref 0–99)
Triglycerides: 184 mg/dL — ABNORMAL HIGH (ref 0–149)
VLDL Cholesterol Cal: 32 mg/dL (ref 5–40)

## 2022-10-21 LAB — CMP14+EGFR
ALT: 17 IU/L (ref 0–32)
AST: 18 IU/L (ref 0–40)
Albumin: 3.9 g/dL (ref 3.9–4.9)
Alkaline Phosphatase: 114 IU/L (ref 44–121)
BUN/Creatinine Ratio: 13 (ref 12–28)
BUN: 32 mg/dL — ABNORMAL HIGH (ref 8–27)
Bilirubin Total: 0.3 mg/dL (ref 0.0–1.2)
CO2: 21 mmol/L (ref 20–29)
Calcium: 9.1 mg/dL (ref 8.7–10.3)
Chloride: 107 mmol/L — ABNORMAL HIGH (ref 96–106)
Creatinine, Ser: 2.44 mg/dL — ABNORMAL HIGH (ref 0.57–1.00)
Globulin, Total: 2.6 g/dL (ref 1.5–4.5)
Glucose: 143 mg/dL — ABNORMAL HIGH (ref 70–99)
Potassium: 4.7 mmol/L (ref 3.5–5.2)
Sodium: 141 mmol/L (ref 134–144)
Total Protein: 6.5 g/dL (ref 6.0–8.5)
eGFR: 21 mL/min/{1.73_m2} — ABNORMAL LOW (ref >59)

## 2022-10-21 LAB — VITAMIN D 25 HYDROXY (VIT D DEFICIENCY, FRACTURES): Vit D, 25-Hydroxy: 31.8 ng/mL (ref 30.0–100.0)

## 2022-10-21 LAB — PTH, INTACT AND CALCIUM: PTH: 107 pg/mL — ABNORMAL HIGH (ref 15–65)

## 2022-10-25 ENCOUNTER — Ambulatory Visit (INDEPENDENT_AMBULATORY_CARE_PROVIDER_SITE_OTHER): Payer: Medicare HMO | Admitting: Family Medicine

## 2022-10-25 ENCOUNTER — Encounter: Payer: Self-pay | Admitting: Family Medicine

## 2022-10-25 VITALS — BP 132/69 | HR 74 | Ht 65.0 in | Wt 220.0 lb

## 2022-10-25 DIAGNOSIS — R899 Unspecified abnormal finding in specimens from other organs, systems and tissues: Secondary | ICD-10-CM

## 2022-10-25 DIAGNOSIS — Z7985 Long-term (current) use of injectable non-insulin antidiabetic drugs: Secondary | ICD-10-CM

## 2022-10-25 DIAGNOSIS — Z794 Long term (current) use of insulin: Secondary | ICD-10-CM

## 2022-10-25 DIAGNOSIS — M25511 Pain in right shoulder: Secondary | ICD-10-CM

## 2022-10-25 DIAGNOSIS — R062 Wheezing: Secondary | ICD-10-CM

## 2022-10-25 DIAGNOSIS — M25561 Pain in right knee: Secondary | ICD-10-CM

## 2022-10-25 DIAGNOSIS — G8929 Other chronic pain: Secondary | ICD-10-CM

## 2022-10-25 DIAGNOSIS — N939 Abnormal uterine and vaginal bleeding, unspecified: Secondary | ICD-10-CM | POA: Diagnosis not present

## 2022-10-25 DIAGNOSIS — E1165 Type 2 diabetes mellitus with hyperglycemia: Secondary | ICD-10-CM

## 2022-10-25 DIAGNOSIS — J410 Simple chronic bronchitis: Secondary | ICD-10-CM | POA: Diagnosis not present

## 2022-10-25 DIAGNOSIS — M25562 Pain in left knee: Secondary | ICD-10-CM

## 2022-10-25 DIAGNOSIS — E1169 Type 2 diabetes mellitus with other specified complication: Secondary | ICD-10-CM | POA: Diagnosis not present

## 2022-10-25 DIAGNOSIS — R7989 Other specified abnormal findings of blood chemistry: Secondary | ICD-10-CM

## 2022-10-25 DIAGNOSIS — E1159 Type 2 diabetes mellitus with other circulatory complications: Secondary | ICD-10-CM | POA: Diagnosis not present

## 2022-10-25 DIAGNOSIS — M25512 Pain in left shoulder: Secondary | ICD-10-CM

## 2022-10-25 DIAGNOSIS — I152 Hypertension secondary to endocrine disorders: Secondary | ICD-10-CM

## 2022-10-25 MED ORDER — BREZTRI AEROSPHERE 160-9-4.8 MCG/ACT IN AERO
2.0000 | INHALATION_SPRAY | Freq: Two times a day (BID) | RESPIRATORY_TRACT | 11 refills | Status: DC
Start: 2022-10-25 — End: 2022-11-10

## 2022-10-25 MED ORDER — ATORVASTATIN CALCIUM 40 MG PO TABS: 40.0000 mg | ORAL_TABLET | Freq: Every day | ORAL | 2 refills | Status: DC

## 2022-10-25 NOTE — Progress Notes (Unsigned)
Acute Office Visit  Subjective:  Patient ID: Brooke [name], female    DOB: 06-27-1955, 67 y.o.   MRN: [id number]  Chief Complaint  Patient presents with   Medical Management of Chronic Issues   Diabetes   Hyperlipidemia   Hypertension   Patient is in today for follow up of chronic conditions and to discuss recent lab results   Vaginal Bleeding  Reports that she continues to have some vaginal bleeding. States that she was referred by Dr. Wolfgang Phoenix to Dr. Pete Glatter for evaluation. She was then lost to follow up. She reports that the bleeding is intermittent. Denies any other symptoms. Reports that she has not gone a full year without bleeding. She will go several months without vaginal bleeding, but has yet to complete a full year without vaginal bleeding.    Type 2 Diabetes with renal disease  Glucometer:Libre. Has it for one month  Reviewed Device, averaged last 30 days is 170 High at home: 245; Low at home: 70, 2-3 times, Taking medication(s): trulicity, basaglar, humalog,.  Last eye exam: due in dec  Last foot exam: 06/2022 Last A1c:  Lab Results  Component Value Date   HGBA1C 7.7 (H) 10/20/2022   Nephropathy screen indicated?: completed with nephrology  Last flu, zoster and/or pneumovax:  Immunization History  Administered Date(s) Administered   Fluad Quad(high Dose 65+) 03/13/2019, 11/28/2019, 12/10/2020   PFIZER(Purple Top)SARS-COV-2 Vaccination 04/12/2019, 05/03/2019, 01/14/2020   ROS: denies dizziness, LOC, polyuria, polydipsia, unintended weight loss, foot ulcerations, numbness in extremities, shortness of breath or chest pain. Endorses Weight gain, tingling in feet  Monitors her feet at home   Hyperlipidemia: Patient presents with hyperlipidemia.  Symptoms include dyspnea and fatigue. There is not a family history of hyperlipidemia. There is not a family history of early ischemia heart disease.  Hypertension Has BP monitor at home Yes BP at home average Does  not check it regularly at home.  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 States that her last knee injection was 11.22.23  Reports that her shoulders have bursitis and they are also painful.   ROS As per HPI  Objective:  BP 132/69   Pulse 74   Ht 5\' 5"  (1.651 m)   Wt 220 lb (99.8 kg)   LMP 03/16/2012   SpO2 98%   BMI 36.61 kg/m   Physical Exam Constitutional:      General: She is awake. She is not in acute distress.    Appearance: Normal appearance. She is well-developed and well-groomed. She is morbidly obese. She is not ill-appearing, toxic-appearing or diaphoretic.  Cardiovascular:     Rate and Rhythm: Normal rate and regular rhythm.     Pulses: Normal pulses.          Radial pulses are 2+ on the right side and 2+ on the left side.       Posterior tibial pulses are 2+ on the right side and 2+ on the left side.     Heart sounds: Normal heart sounds. No murmur heard.    No gallop.  Pulmonary:     Effort: Pulmonary effort is normal. No respiratory distress.     Breath sounds: No stridor. Examination of the right-upper field reveals wheezing. Examination of the left-upper  field reveals wheezing. Examination of the right-middle field reveals wheezing. Examination of the left-middle field reveals wheezing. Examination of the right-lower field reveals wheezing. Examination of the left-lower field reveals wheezing. Wheezing present. No rhonchi or rales.  Musculoskeletal:     Cervical back: Full passive range of motion without pain and neck supple.     Right lower leg: No edema.     Left lower leg: No edema.  Skin:    General: Skin is warm.     Capillary  Refill: Capillary refill takes less than 2 seconds.  Neurological:     General: No focal deficit present.     Mental Status: She is alert, oriented to person, place, and time and easily aroused. Mental status is at baseline.     GCS: GCS eye subscore is 4. GCS verbal subscore is 5. GCS motor subscore is 6.     Motor: No weakness.  Psychiatric:        Attention and Perception: Attention and perception normal.        Mood and Affect: Mood and affect normal.        Speech: Speech normal.        Behavior: Behavior normal. Behavior is cooperative.        Thought Content: Thought content normal. Thought content does not include homicidal or suicidal ideation. Thought content does not include homicidal or suicidal plan.        Cognition and Memory: Cognition and memory normal.        Judgment: Judgment normal.   .    10/25/2022   11:03 AM 07/21/2022    1:35 PM 06/23/2022    1:16 PM  Depression screen PHQ 2/9  Decreased Interest 0 0 0  Down, Depressed, Hopeless 0 0 0  PHQ - 2 Score 0 0 0  Altered sleeping 0 0 0  Tired, decreased energy 0 0 0  Change in appetite 0 0 0  Feeling bad or failure about yourself  0 0 0  Trouble concentrating 0 0 0  Moving slowly or fidgety/restless 0 0 0  Suicidal thoughts 0 0 0  PHQ-9 Score 0 0 0  Difficult doing work/chores Not difficult at all Not difficult at all Not difficult at all      10/25/2022   11:03 AM 07/21/2022    1:36 PM 06/23/2022    1:16 PM 01/26/2022    2:05 PM  GAD 7 : Generalized Anxiety Score  Nervous, Anxious, on Edge 0 0 0 0  Control/stop worrying 0 0 0 0  Worry too much - different things 0 0 0 0  Trouble relaxing 0 0 0 0  Restless 0 0 0 0  Easily annoyed or irritable 0 0 0 0  Afraid - awful might happen 0 0 0 0  Total GAD 7 Score 0 0 0 0  Anxiety Difficulty Not difficult at all Not difficult at all Not difficult at all Not difficult at all   The 10-year ASCVD risk score (Arnett DK, et al., 2019) is: 21.3%   Values used to  calculate the score:     Age: 69 years     Sex: Female     Is Non-Hispanic African American: Yes     Diabetic: Yes     Tobacco smoker: No     Systolic Blood Pressure: 132 mmHg     Is BP treated: Yes     HDL Cholesterol: 33 mg/dL     Total Cholesterol: 159 mg/dL  Assessment & Plan:  1. Type 2 diabetes mellitus with hyperglycemia, with long-term current use of insulin (HCC) A1C not at goal. Unable to start metformin due to Renal function. Weary about starting [place] due to GFR. She is currently established with pharmacist, Vanice Sarah for assistance with management of diabetes, appreciate recommendations. Will refer as below for elevated PTH and for additional recommendations for Diabetes management.  - Ambulatory referral to Endocrinology  2. Long-term (current) use of injectable non-insulin antidiabetic drugs A1C not at goal. Unable to start metformin due to Renal function. Weary about starting [place] due to GFR. She is currently established with pharmacist, Vanice Sarah for assistance with management of diabetes, appreciate recommendations. Will refer as below for elevated PTH and for additional recommendations for Diabetes management.   3. Hyperlipidemia associated with type 2 diabetes mellitus (HCC) Elevated ASCVD risk score. Will continue statin as below.  - atorvastatin (LIPITOR) 40 MG tablet; Take 1 tablet (40 mg total) by mouth daily.  Dispense: 30 tablet; Refill: 2  4. Hypertension associated with diabetes (HCC) Slightly above goal in office. Patient to monitor BP at home and bring measurements to follow up appt. Patient verbalized that she has monitor at home.   5. Simple chronic bronchitis (HCC) Provided medication as below. Patient declined follow up with pulmonology  - Budeson-Glycopyrrol-Formoterol (BREZTRI AEROSPHERE) 160-9-4.8 MCG/ACT AERO; Inhale 2 puffs into the lungs 2 (two) times daily.  Dispense: 10.7 g; Refill: 11  6. Wheezing Provided medication as below.  Patient declined follow up with pulmonology  - Budeson-Glycopyrrol-Formoterol (BREZTRI AEROSPHERE) 160-9-4.8 MCG/ACT AERO; Inhale 2 puffs into the lungs 2 (two) times daily.  Dispense: 10.7 g; Refill: 11  7. Vaginal bleeding Referral placed as below. Patient to follow up as she desires.  - Ambulatory referral to Gynecology  8. Abnormal laboratory test Labs as below. Will communicate results to patient once available. Will await results to determine next steps.  - Urinalysis, Routine w reflex microscopic; Future  9. Chronic pain of both knees Discussed with patient following up for OV to discuss pain. Unable to provide NSAID due to renal function. Will consider following up with PT and ortho.   10. Chronic pain of both shoulders Discussed with patient following up for OV to discuss pain. Unable to provide NSAID due to renal function. Will consider following up with PT and ortho.   11. Elevated parathyroid hormone Referral placed as below.  - Ambulatory referral to Endocrinology  The above assessment and management plan was discussed with the patient. The patient verbalized understanding of and has agreed to the management plan using shared-decision making. Patient is aware to call the clinic if they develop any new symptoms or if symptoms fail to improve or worsen. Patient is aware when to return to the clinic for a follow-up visit. Patient educated on when it is appropriate to go to the emergency department.   Return in about 3 months (around 01/25/2023) for Chronic Condition Follow up.  Neale Burly, DNP-FNP [facility] [address] [place], [place] [address] [phone]

## 2022-11-01 ENCOUNTER — Ambulatory Visit: Payer: Medicare HMO | Admitting: Family Medicine

## 2022-11-02 ENCOUNTER — Encounter: Payer: Self-pay | Admitting: Family Medicine

## 2022-11-10 ENCOUNTER — Ambulatory Visit (INDEPENDENT_AMBULATORY_CARE_PROVIDER_SITE_OTHER): Payer: Medicare HMO | Admitting: Family Medicine

## 2022-11-10 ENCOUNTER — Encounter: Payer: Self-pay | Admitting: Family Medicine

## 2022-11-10 VITALS — BP 138/66 | HR 71 | Temp 98.1°F | Ht 65.0 in | Wt 221.0 lb

## 2022-11-10 DIAGNOSIS — M25561 Pain in right knee: Secondary | ICD-10-CM

## 2022-11-10 DIAGNOSIS — E1165 Type 2 diabetes mellitus with hyperglycemia: Secondary | ICD-10-CM | POA: Diagnosis not present

## 2022-11-10 DIAGNOSIS — Z794 Long term (current) use of insulin: Secondary | ICD-10-CM

## 2022-11-10 DIAGNOSIS — J410 Simple chronic bronchitis: Secondary | ICD-10-CM | POA: Diagnosis not present

## 2022-11-10 DIAGNOSIS — E1159 Type 2 diabetes mellitus with other circulatory complications: Secondary | ICD-10-CM | POA: Diagnosis not present

## 2022-11-10 DIAGNOSIS — G8929 Other chronic pain: Secondary | ICD-10-CM | POA: Diagnosis not present

## 2022-11-10 DIAGNOSIS — Z7985 Long-term (current) use of injectable non-insulin antidiabetic drugs: Secondary | ICD-10-CM | POA: Diagnosis not present

## 2022-11-10 DIAGNOSIS — D649 Anemia, unspecified: Secondary | ICD-10-CM

## 2022-11-10 DIAGNOSIS — I152 Hypertension secondary to endocrine disorders: Secondary | ICD-10-CM

## 2022-11-10 DIAGNOSIS — N939 Abnormal uterine and vaginal bleeding, unspecified: Secondary | ICD-10-CM

## 2022-11-10 MED ORDER — BUDESONIDE-FORMOTEROL FUMARATE 80-4.5 MCG/ACT IN AERO: 2.0000 | INHALATION_SPRAY | Freq: Two times a day (BID) | RESPIRATORY_TRACT | 3 refills | Status: DC

## 2022-11-10 NOTE — Assessment & Plan Note (Signed)
Patient previously referred to Pulmonology for evaluation. She has not followed up at this time. Previously tried [employer], but was not able to afford the medication. Due to cost and patient preference, will switch back to Symbicort.

## 2022-11-10 NOTE — Assessment & Plan Note (Signed)
BP slightly above goal. Discussed with patient monitoring BP at home and bringing measurements to follow up appt. Patient verified that she has a log and monitor at home. Reports compliance with medications.

## 2022-11-10 NOTE — Progress Notes (Signed)
Subjective:  Patient ID: Brooke [name], female    DOB: 04/10/1955, 67 y.o.   MRN: [id number]  Patient Care Team: [name], FNP as PCP - General (Family Medicine) Danella Maiers, [facility] (Pharmacist) Delora Fuel, [place] (Optometry) Dione Booze, Bertram Millard, MD as Consulting Physician (Ophthalmology) Audrie Gallus, RN as [facility] Care Management   Chief Complaint:  Knee Pain (Right knee injection)  HPI: Brooke [name] is a 67 y.o. female presenting on 11/10/2022 for Knee Pain (Right knee injection)  Patient presents today for right knee injection. Patient has not receiving injection in the last 3 months. She previously received injections from PCP. Patient is not currently established with ortho.   In addition, patient would like to have her Hemoglobin rechecked given continued bleeding. States that she has not set up an appt with gynecology or endocrinology due to a vacation coming up.  She is following with Dr. Wolfgang Phoenix for her renal disease.  States that she tried to refill Oxford, but it was too expensive. She feels that Symbicort works better for her and she would like to switch back to Symbicort.   Relevant past medical, surgical, family, and social history reviewed and updated as indicated.  Allergies and medications reviewed and updated. Data reviewed: Chart in Epic.   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   Hypertension    Hyperuricemia 11/27/2020   Obesity (BMI 35.0-39.9 without comorbidity)    Renal mass 08/04/2021   Vertigo    Vitamin D insufficiency 11/27/2020    Past Surgical History:  Procedure Laterality Date   BREAST SURGERY Right 1974   OPERATIVE  ULTRASOUND N/A 08/04/2021   Procedure: OPERATIVE ULTRASOUND;  Surgeon: Sebastian Ache, MD;  Location: [facility];  Service: Urology;  Laterality: N/A;   ROBOTIC ASSITED PARTIAL NEPHRECTOMY Right 08/04/2021   Procedure: XI ROBOTIC ASSITED RADICAL NEPHRECTOMY;  Surgeon: Sebastian Ache, MD;  Location: [facility];  Service: Urology;  Laterality: Right;    Social History   Socioeconomic History   Marital status: Married    Spouse name: Donnie   Number of children: 1   Years of education: Not on file   Highest education level: Not on file  Occupational History   Not on file  Tobacco Use   Smoking status: Never   Smokeless tobacco: Never  Vaping Use   Vaping status: Never Used  Substance and Sexual Activity   Alcohol use: No   Drug use: No   Sexual activity: Never  Other Topics Concern   Not on file  Social History Narrative   1 daughter    1 grandson   Social Determinants of Health   Financial Resource 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  (04/25/2022)   Overall Financial Resource Strain (CARDIA)    Difficulty of Paying Living Expenses: Not hard at all  Recent Concern: Financial Resource Strain - Medium Risk (02/14/2022)   Overall Financial Resource Strain (CARDIA)    Difficulty of Paying Living Expenses: Somewhat hard  Food Insecurity: No Food Insecurity (04/25/2022)   Hunger Vital Sign    Worried About Running Out of Food in the Last Year: Never true  Ran Out of Food in the Last Year: Never true  Recent Concern: Food Insecurity - Food Insecurity Present (02/14/2022)   Hunger Vital Sign    Worried About Running Out of Food in the Last Year: Often true    Ran Out of Food in the Last Year: Often true  Transportation Needs: No Transportation Needs (04/25/2022)   PRAPARE - Administrator, Civil Service (Medical): No    Lack of Transportation (Non-Medical): No  Physical Activity: Inactive (04/25/2022)   Exercise Vital Sign    Days of Exercise per Week: 0 days    Minutes of Exercise per  Session: 0 min  Stress: No Stress Concern Present (04/25/2022)   [employer] of Occupational Health - Occupational Stress Questionnaire    Feeling of Stress : Not at all  Social Connections: Moderately Integrated (04/25/2022)   Social Connection and Isolation Panel [NHANES]    Frequency of Communication with Friends and Family: More than three times a week    Frequency of Social Gatherings with Friends and Family: More than three times a week    Attends Religious Services: More than 4 times per year    Active Member of [employer] or Organizations: No    Attends Banker Meetings: Never    Marital Status: Married  Catering manager Violence: Not At Risk (04/25/2022)   Humiliation, Afraid, Rape, and Kick questionnaire    Fear of Current or Ex-Partner: No    Emotionally Abused: No    Physically Abused: No    Sexually Abused: No    Outpatient Encounter Medications as of 11/10/2022  Medication Sig   acetaminophen (TYLENOL) 500 MG tablet Take 1,000 mg by mouth every 8 (eight) hours as needed for moderate pain.   albuterol (VENTOLIN HFA) 108 (90 Base) MCG/ACT inhaler Inhale 2 puffs into the lungs every 6 (six) hours as needed for wheezing or shortness of breath.   amLODipine (NORVASC) 10 MG tablet Take 1 tablet (10 mg total) by mouth daily.   atenolol (TENORMIN) 100 MG tablet Take 1 tablet (100 mg total) by mouth every morning.   atorvastatin (LIPITOR) 40 MG tablet Take 1 tablet (40 mg total) by mouth daily.   budesonide-formoterol (SYMBICORT) 80-4.5 MCG/ACT inhaler Inhale 2 puffs into the lungs 2 (two) times daily.   calcium acetate (PHOSLO) 667 MG tablet Take by mouth.   Camphor-Menthol-Methyl Sal (SALONPAS) 3.03-12-08 % PTCH Apply 1 patch topically daily as needed (pain).   Cholecalciferol 25 MCG (1000 UT) capsule Take 1,000 Units by mouth daily.   Continuous Glucose Receiver (FREESTYLE LIBRE 3 READER) DEVI 1 Device by Does not apply route as directed. with compatible Freestyle Libre  3 sensor to monitor glucose continuously. DX:E11.65   Continuous Glucose Sensor (FREESTYLE LIBRE 3 SENSOR) MISC Use 1 device subcutaneously as directed to monitor glucose continuously every 14 days. DX:E11.65   docusate sodium (COLACE) 100 MG capsule Take 1 capsule (100 mg total) by mouth 2 (two) times daily.   Dulaglutide (TRULICITY) 4.5 MG/0.5ML SOPN Inject 4.5 mg as directed once a week.   hydrochlorothiazide (HYDRODIURIL) 25 MG tablet Take 1 tablet (25 mg total) by mouth daily.   Insulin Glargine (BASAGLAR KWIKPEN) 100 UNIT/ML Inject 37 Units into the skin daily.   insulin lispro (HUMALOG KWIKPEN) 100 UNIT/ML KwikPen Inject 10-15 Units into the skin See admin instructions. 10 units WITH BREAKFAST AND LUNCH, TAKE 15 units at [facility]   Insulin Pen Needle 31G X 6 MM MISC Use to inject insulin 4  times daily as directed. DX: E11.65   lisinopril (ZESTRIL) 10 MG tablet Take by mouth.   Menthol, Topical Analgesic, (BIOFREEZE) 4 % GEL Apply 1 application. topically daily as needed (pain).   promethazine-dextromethorphan (PROMETHAZINE-DM) 6.25-15 MG/5ML syrup Take 5 mLs by mouth 4 (four) times daily as needed.   sodium bicarbonate 650 MG tablet Take by mouth.   [DISCONTINUED] Budeson-Glycopyrrol-Formoterol (BREZTRI AEROSPHERE) 160-9-4.8 MCG/ACT AERO Inhale 2 puffs into the lungs 2 (two) times daily.   No facility-administered encounter medications on file as of 11/10/2022.    Allergies  Allergen Reactions   Jardiance [Empagliflozin]     Holding per nephro   Chauncey Mann [Finerenone]     Holding per nephro   Metformin And Related     CONTRAINDICATED DUE TO GFR; GI SIDE EFFECTS    Review of Systems  All other systems reviewed and are negative.   Objective:  BP 138/66   Pulse 71   Temp 98.1 F (36.7 C)   Ht 5\' 5"  (1.651 m)   Wt 221 lb (100.2 kg)   LMP 03/16/2012   SpO2 96%   BMI 36.78 kg/m    Wt Readings from Last 3 Encounters:  11/10/22 221 lb (100.2 kg)  10/25/22 220 lb (99.8 kg)   07/21/22 220 lb (99.8 kg)    Physical Exam Constitutional:      General: She is awake. She is not in acute distress.    Appearance: Normal appearance. She is well-developed and well-groomed. She is morbidly obese. She is not ill-appearing, toxic-appearing or diaphoretic.  Cardiovascular:     Rate and Rhythm: Normal rate.     Pulses: Normal pulses.          Radial pulses are 2+ on the right side and 2+ on the left side.       Posterior tibial pulses are 2+ on the right side and 2+ on the left side.     Heart sounds: Normal heart sounds. No murmur heard.    No gallop.  Pulmonary:     Effort: Pulmonary effort is normal. No respiratory distress.     Breath sounds: No stridor. Examination of the right-middle field reveals wheezing. Examination of the left-middle field reveals wheezing. Examination of the right-lower field reveals wheezing. Examination of the left-lower field reveals wheezing. Wheezing present. No rhonchi or rales.  Musculoskeletal:     Cervical back: Full passive range of motion without pain and neck supple.     Right knee: No swelling, deformity, effusion, erythema, ecchymosis, lacerations, bony tenderness or crepitus. Decreased range of motion. Tenderness present over the lateral joint line. No medial joint line tenderness. Normal alignment, normal meniscus and normal patellar mobility. Normal pulse.     Right lower leg: No edema.     Left lower leg: No edema.  Skin:    General: Skin is warm.     Capillary Refill: Capillary refill takes less than 2 seconds.  Neurological:     General: No focal deficit present.     Mental Status: She is alert, oriented to person, place, and time and easily aroused. Mental status is at baseline.     GCS: GCS eye subscore is 4. GCS verbal subscore is 5. GCS motor subscore is 6.     Motor: No weakness.  Psychiatric:        Attention and Perception: Attention and perception normal.        Mood and Affect: Mood and affect normal.  Speech: Speech normal.        Behavior: Behavior normal. Behavior is cooperative.        Thought Content: Thought content normal. Thought content does not include homicidal or suicidal ideation. Thought content does not include homicidal or suicidal plan.        Cognition and Memory: Cognition and memory normal.        Judgment: Judgment normal.    Results for orders placed or performed in visit on 10/20/22  VITAMIN D 25 Hydroxy (Vit-D Deficiency, Fractures)  Result Value Ref Range   Vit D, 25-Hydroxy 31.8 30.0 - 100.0 ng/mL  Anemia Profile B  Result Value Ref Range   Total Iron Binding Capacity 295 250 - 450 ug/dL   UIBC 161 096 - 045 ug/dL   Iron 53 27 - 409 ug/dL   Iron Saturation 18 15 - 55 %   Ferritin 26 15 - 150 ng/mL   Vitamin B-12 585 232 - 1,245 pg/mL   Folate 13.3 >3.0 ng/mL   WBC 8.7 3.4 - 10.8 x10E3/uL   RBC 4.16 3.77 - 5.28 x10E6/uL   Hemoglobin 10.9 (L) 11.1 - 15.9 g/dL   Hematocrit 81.1 91.4 - 46.6 %   MCV 83 79 - 97 fL   MCH 26.2 (L) 26.6 - 33.0 pg   MCHC 31.6 31.5 - 35.7 g/dL   RDW 78.2 95.6 - 21.3 %   Platelets 409 150 - 450 x10E3/uL   Neutrophils 48 Not Estab. %   Lymphs 34 Not Estab. %   Monocytes 6 Not Estab. %   Eos 11 Not Estab. %   Basos 1 Not Estab. %   Neutrophils Absolute 4.1 1.4 - 7.0 x10E3/uL   Lymphocytes Absolute 3.0 0.7 - 3.1 x10E3/uL   Monocytes Absolute 0.5 0.1 - 0.9 x10E3/uL   EOS (ABSOLUTE) 1.0 (H) 0.0 - 0.4 x10E3/uL   Basophils Absolute 0.1 0.0 - 0.2 x10E3/uL   Immature Granulocytes 0 Not Estab. %   Immature Grans (Abs) 0.0 0.0 - 0.1 x10E3/uL   Retic Ct Pct 1.7 0.6 - 2.6 %  CMP14+EGFR  Result Value Ref Range   Glucose 143 (H) 70 - 99 mg/dL   BUN 32 (H) 8 - 27 mg/dL   Creatinine, Ser 0.86 (H) 0.57 - 1.00 mg/dL   eGFR 21 (L) >57 QI/ONG/2.95   BUN/Creatinine Ratio 13 12 - 28   Sodium 141 134 - 144 mmol/L   Potassium 4.7 3.5 - 5.2 mmol/L   Chloride 107 (H) 96 - 106 mmol/L   CO2 21 20 - 29 mmol/L   Calcium 9.1 8.7 - 10.3 mg/dL    Total Protein 6.5 6.0 - 8.5 g/dL   Albumin 3.9 3.9 - 4.9 g/dL   Globulin, Total 2.6 1.5 - 4.5 g/dL   Bilirubin Total 0.3 0.0 - 1.2 mg/dL   Alkaline Phosphatase 114 44 - 121 IU/L   AST 18 0 - 40 IU/L   ALT 17 0 - 32 IU/L  Lipid panel  Result Value Ref Range   Cholesterol, Total 159 100 - 199 mg/dL   Triglycerides 284 (H) 0 - 149 mg/dL   HDL 33 (L) >13 mg/dL   VLDL Cholesterol Cal 32 5 - 40 mg/dL   LDL Chol Calc (NIH) 94 0 - 99 mg/dL   Chol/HDL Ratio 4.8 (H) 0.0 - 4.4 ratio  PTH, Intact and Calcium  Result Value Ref Range   PTH 107 (H) 15 - 65 pg/mL   PTH Interp Comment   Bayer  DCA Hb A1c Waived  Result Value Ref Range   HB A1C (BAYER DCA - WAIVED) 7.7 (H) 4.8 - 5.6 %       11/10/2022    1:42 PM 10/25/2022   11:03 AM 07/21/2022    1:35 PM 06/23/2022    1:16 PM 04/25/2022   12:04 PM  Depression screen PHQ 2/9  Decreased Interest 0 0 0 0 0  Down, Depressed, Hopeless 0 0 0 0 0  PHQ - 2 Score 0 0 0 0 0  Altered sleeping 0 0 0 0   Tired, decreased energy 0 0 0 0   Change in appetite 0 0 0 0   Feeling bad or failure about yourself  0 0 0 0   Trouble concentrating 0 0 0 0   Moving slowly or fidgety/restless 0 0 0 0   Suicidal thoughts 0 0 0 0   PHQ-9 Score 0 0 0 0   Difficult doing work/chores Not difficult at all Not difficult at all Not difficult at all Not difficult at all        11/10/2022    1:42 PM 10/25/2022   11:03 AM 07/21/2022    1:36 PM 06/23/2022    1:16 PM  GAD 7 : Generalized Anxiety Score  Nervous, Anxious, on Edge 0 0 0 0  Control/stop worrying 0 0 0 0  Worry too much - different things 0 0 0 0  Trouble relaxing 0 0 0 0  Restless 0 0 0 0  Easily annoyed or irritable 0 0 0 0  Afraid - awful might happen 0 0 0 0  Total GAD 7 Score 0 0 0 0  Anxiety Difficulty Not difficult at all Not difficult at all Not difficult at all Not difficult at all    Joint Injection/Arthrocentesis  Date/Time: 11/10/2022 2:21 PM  Performed by: [name],  FNP Authorized by: [name], FNP  Indications: pain  Body area: knee Joint: right knee Local anesthesia used: no  Anesthesia: Local anesthesia used: no  Sedation: Patient sedated: no  Needle size: 22 G Ultrasound guidance: no Approach: lateral Aspirate amount: 0 mL Betamethasone amount: 1 mg Lidocaine 1% amount: 3 mL Patient tolerance: patient tolerated the procedure well with no immediate complications Comments: Completed with Birder Robson., FNP present     Pertinent labs & imaging results that were available during my care of the patient were reviewed by me and considered in my medical decision making.  Assessment & Plan:  Angell was seen today for knee pain.  Diagnoses and all orders for this visit:  Chronic pain of right knee Procedure completed. Tolerated well.  -     Joint Injection/Arthrocentesis  Simple chronic bronchitis (HCC) Will restart symbicort as below.  -     budesonide-formoterol (SYMBICORT) 80-4.5 MCG/ACT inhaler; Inhale 2 puffs into the lungs 2 (two) times daily.  Anemia, unspecified type Labs as below. Will communicate results to patient once available. Will await results to determine next steps.  -     CBC with Differential/Platelet  Type 2 diabetes mellitus with hyperglycemia, with long-term current use of insulin (HCC) Discussed with patient that injection today may increase BG at home and to monitor closely.  Patient referred to endocrinology. Patient to follow up with specialist.   Long-term current use of injectable noninsulin antidiabetic medication Discussed with patient that injection today may increase BG at home and to monitor closely.   Vaginal bleeding Referral placed to gynecology and endocrinology. Patient  to follow up with specialist.    Continue all other maintenance medications.  Follow up plan: Return in about 3 months (around 02/09/2023) for Chronic Condition Follow up.  Continue healthy lifestyle choices,  including diet (rich in fruits, vegetables, and lean proteins, and low in salt and simple carbohydrates) and exercise (at least 30 minutes of moderate physical activity daily).  Written and verbal instructions provided   The above assessment and management plan was discussed with the patient. The patient verbalized understanding of and has agreed to the management plan. Patient is aware to call the clinic if they develop any new symptoms or if symptoms persist or worsen. Patient is aware when to return to the clinic for a follow-up visit. Patient educated on when it is appropriate to go to the emergency department.   Neale Burly, DNP-FNP [facility] [address] [place], [place] [address] [phone]

## 2022-11-10 NOTE — Assessment & Plan Note (Signed)
A1C not at goal. Unable to start metformin due to Renal function. Weary about starting [place] due to GFR. She is currently established with pharmacist, Vanice Sarah for assistance with management of diabetes, appreciate recommendations. Referred to endocrinology for elevated PTH and for additional recommendations for Diabetes management. Discussed with patient that steroid injections of the knee can increase her BG and she needs to monitor it more closely.

## 2022-11-10 NOTE — Assessment & Plan Note (Signed)
Referrals placed to endocrinology and gynecology. Patient to follow up with specialties. Will continue to monitor anemia.

## 2022-11-10 NOTE — Assessment & Plan Note (Deleted)
A1C not at goal. Unable to start metformin due to Renal function. Weary about starting [place] due to GFR. She is currently established with pharmacist, Vanice Sarah for assistance with management of diabetes, appreciate recommendations. Referred to endocrinology for elevated PTH and for additional recommendations for Diabetes management. Discussed with patient that steroid injections of the knee can increase her BG and she needs to monitor it more closely.

## 2022-11-10 NOTE — Patient Instructions (Signed)
Please follow up with Endocrinology,  Gynecology,  Nephrology,  Pulmonology

## 2022-11-11 LAB — CBC WITH DIFFERENTIAL/PLATELET
Basophils Absolute: 0.1 10*3/uL (ref 0.0–0.2)
Basos: 1 %
EOS (ABSOLUTE): 1.1 10*3/uL — ABNORMAL HIGH (ref 0.0–0.4)
Eos: 11 %
Hematocrit: 35.7 % (ref 34.0–46.6)
Hemoglobin: 11.1 g/dL (ref 11.1–15.9)
Immature Grans (Abs): 0 10*3/uL (ref 0.0–0.1)
Immature Granulocytes: 0 %
Lymphocytes Absolute: 3.1 10*3/uL (ref 0.7–3.1)
Lymphs: 31 %
MCH: 26.2 pg — ABNORMAL LOW (ref 26.6–33.0)
MCHC: 31.1 g/dL — ABNORMAL LOW (ref 31.5–35.7)
MCV: 84 fL (ref 79–97)
Monocytes Absolute: 0.7 10*3/uL (ref 0.1–0.9)
Monocytes: 8 %
Neutrophils Absolute: 4.8 10*3/uL (ref 1.4–7.0)
Neutrophils: 49 %
Platelets: 415 10*3/uL (ref 150–450)
RBC: 4.23 x10E6/uL (ref 3.77–5.28)
RDW: 13.7 % (ref 11.7–15.4)
WBC: 9.9 10*3/uL (ref 3.4–10.8)

## 2022-11-11 NOTE — Progress Notes (Signed)
Labs stable, hemoglobin now in normal range. Please follow up with endocrinology and gynecology.

## 2022-11-15 MED ORDER — METHYLPREDNISOLONE ACETATE 40 MG/ML IJ SUSP
40.0000 mg | Freq: Once | INTRAMUSCULAR | Status: AC
Start: 2022-11-15 — End: 2022-11-10
  Administered 2022-11-10: 40 mg via INTRAMUSCULAR

## 2022-11-15 NOTE — Addendum Note (Signed)
Addended by: Angela Nevin D on: 11/15/2022 07:27 AM   Modules accepted: Orders

## 2022-11-28 ENCOUNTER — Telehealth: Payer: Self-pay | Admitting: *Deleted

## 2022-11-28 ENCOUNTER — Other Ambulatory Visit: Payer: Medicare HMO | Admitting: *Deleted

## 2022-11-28 ENCOUNTER — Telehealth: Payer: Medicare HMO

## 2022-11-28 NOTE — Patient Outreach (Signed)
Care Management   Follow Up Note   11/28/2022 Name: Rosalinda Romansky MRN: [id number] DOB: September 15, 1955   Referred by: [name], FNP Reason for referral : Care Coordination   An unsuccessful telephone outreach was attempted today. The patient was referred to the case management team for assistance with care management and care coordination.   Follow Up Plan: Telephone follow up appointment with care management team member scheduled for: upon care guide rescheduling.  Irving Shows [facility], BSN [place]/ Ambulatory Care Management [phone]

## 2022-11-29 DIAGNOSIS — N189 Chronic kidney disease, unspecified: Secondary | ICD-10-CM | POA: Diagnosis not present

## 2022-11-29 DIAGNOSIS — N2581 Secondary hyperparathyroidism of renal origin: Secondary | ICD-10-CM | POA: Diagnosis not present

## 2022-11-29 DIAGNOSIS — N184 Chronic kidney disease, stage 4 (severe): Secondary | ICD-10-CM | POA: Diagnosis not present

## 2022-11-29 DIAGNOSIS — Z6836 Body mass index (BMI) 36.0-36.9, adult: Secondary | ICD-10-CM | POA: Diagnosis not present

## 2022-11-29 DIAGNOSIS — E1122 Type 2 diabetes mellitus with diabetic chronic kidney disease: Secondary | ICD-10-CM | POA: Diagnosis not present

## 2022-11-29 DIAGNOSIS — E1129 Type 2 diabetes mellitus with other diabetic kidney complication: Secondary | ICD-10-CM | POA: Diagnosis not present

## 2022-11-29 DIAGNOSIS — I129 Hypertensive chronic kidney disease with stage 1 through stage 4 chronic kidney disease, or unspecified chronic kidney disease: Secondary | ICD-10-CM | POA: Diagnosis not present

## 2022-12-07 ENCOUNTER — Encounter: Payer: Self-pay | Admitting: *Deleted

## 2022-12-07 ENCOUNTER — Other Ambulatory Visit: Payer: Medicare HMO | Admitting: *Deleted

## 2022-12-07 NOTE — Patient Outreach (Signed)
Care Management   Visit Note  12/07/2022 Name: Brooke [name] MRN: [id number] DOB: 07/03/1955  Subjective: Brooke [name] is a 67 y.o. year old female who is a primary care patient of Ellamae Sia, [name], FNP. The Care Management team was consulted for assistance.      Engaged with patient spoke with patient by telephone for follow up   Goals Addressed             This Visit's Progress    CCM (CHRONIC KIDNEY DISEASE) EXPECTED OUTCOME: MONITOR, SELF-MANAGE AND REDUCE SYMPTOMS OF CHRONIC KIDNEY DISEASE       Current Barriers:  Knowledge Deficits related to CKD Chronic Disease Management support and education needs related to CKD No Advanced Directives in place- Pt declines Pt reports she is concerned about her kidneys and trying to keep blood sugar, blood pressure WNL to preserve kidney function, does not drink sugary drinks Pt states she continues to work on her diet, no new concerns reported today, continues to follow up with nephrologist  Planned Interventions: Assessed the patient     understanding of chronic kidney disease    Evaluation of current treatment plan related to chronic kidney disease self management and patient's adherence to plan as established by provider      Reviewed medications with patient and discussed importance of compliance    Counseled on the importance of exercise goals with target of 150 minutes per week     Advised patient, providing education and rationale, to monitor blood pressure daily and record, calling PCP for findings outside established parameters    Discussed complications of poorly controlled blood pressure such as heart disease, stroke, circulatory complications, vision complications, kidney impairment, sexual dysfunction    Reinforced importance of staying active Reinforced low sodium diet and nutritious food choices  Symptom Management: Take medications as prescribed   Attend all scheduled provider appointments Call pharmacy  for medication refills 3-7 days in advance of running out of medications Attend church or other social activities Perform all self care activities independently  Perform IADL's (shopping, preparing meals, housekeeping, managing finances) independently Call provider office for new concerns or questions  Try to do some type of exercise daily- walking is good Continue to check blood pressure and keep a log Drink adequate fluids, preferably water  Follow Up Plan: Telephone follow up appointment with care management team member scheduled for:   02/23/23 at 1030 am       CCM (DIABETES) EXPECTED OUTCOME:  MONITOR, SELF-MANAGE AND REDUCE SYMPTOMS OF DIABETES       Current Barriers:  Knowledge Deficits related to Diabetes management Chronic Disease Management support and education needs related to Diabetes Financial Constraints.  No Advanced Directives in place- pt declines Pt reports she checks CBG daily with fasting ranges 80-170 with most readings under 130, random readings in 100's range, all under 200 Pt reports she tried to eat healthy and be mindful of carbohydrate intake at each meal and with snacks Pt reports recent AIC 8.1 on 07/21/22 now decreased to 7.7 on 10/20/22, pt is pleased trending down Patient reports she now uses CGM Freestyle [place]   Planned Interventions: Reviewed medications with patient and discussed importance of medication adherence;        Counseled on importance of regular laboratory monitoring as prescribed;        Advised patient, providing education and rationale, to check cbg twice daily and record        call provider for findings outside established parameters;  Review of patient status, including review of consultants reports, relevant laboratory and other test results, and medications completed;       Reinforced carbohydrate modified diet Reinforced importance of working with pharmacist and using CGM consistenly Reviewed plan of care with pt,  pt feels  comfortable having 10-12 week outreaches from RN care manager Reviewed all scheduled appointments including endocrinologist Ronny Bacon 02/20/23  Symptom Management: Take medications as prescribed   Attend all scheduled provider appointments Call pharmacy for medication refills 3-7 days in advance of running out of medications Attend church or other social activities Perform all self care activities independently  Perform IADL's (shopping, preparing meals, housekeeping, managing finances) independently Call provider office for new concerns or questions  check blood sugar at prescribed times: twice daily check feet daily for cuts, sores or redness enter blood sugar readings and medication or insulin into daily log take the blood sugar log to all doctor visits take the blood sugar meter to all doctor visits trim toenails straight across fill half of plate with vegetables limit fast food meals to no more than 1 per week manage portion size prepare main meal at home 3 to 5 days each week read food labels for fat, fiber, carbohydrates and portion size set a realistic goal keep feet up while sitting wash and dry feet carefully every day wear comfortable, cotton socks Appointment with endocrinologist Ronny Bacon is 02/20/23 @ 9 am  Follow Up Plan: Telephone follow up appointment with care management team member scheduled for:   02/23/23 at 1030 am       CCM (HYPERTENSION) EXPECTED OUTCOME: MONITOR, SELF-MANAGE AND REDUCE SYMPTOMS OF HYPERTENSION       Current Barriers:  Knowledge Deficits related to Hypertension management Chronic Disease Management support and education needs related to Hypertension No Advanced Directives in place- pt declines Pt reports she lives with spouse, is independent in all aspects of her care, is trying to exercise now since her knees are feeling better,  tries to adhere to healthy diet Pt reports she checks blood pressure on occasion and readings are  usually good No new concerns reported with blood pressure  Planned Interventions: Evaluation of current treatment plan related to hypertension self management and patient's adherence to plan as established by provider;   Reviewed medications with patient and discussed importance of compliance;  Counseled on the importance of exercise goals with target of 150 minutes per week Advised patient, providing education and rationale, to monitor blood pressure daily and record, calling PCP for findings outside established parameters;  Discussed complications of poorly controlled blood pressure such as heart disease, stroke, circulatory complications, vision complications, kidney impairment, sexual dysfunction;  Reviewed all upcoming scheduled appointments Reviewed low sodium diet  Symptom Management: Take medications as prescribed   Attend all scheduled provider appointments Call pharmacy for medication refills 3-7 days in advance of running out of medications Attend church or other social activities Perform all self care activities independently  Perform IADL's (shopping, preparing meals, housekeeping, managing finances) independently Call provider office for new concerns or questions  check blood pressure weekly choose a place to take my blood pressure (home, clinic or office, retail store) write blood pressure results in a log or diary learn about high blood pressure keep a blood pressure log take blood pressure log to all doctor appointments call doctor for signs and symptoms of high blood pressure keep all doctor appointments take medications for blood pressure exactly as prescribed begin an exercise program report new symptoms  to your doctor eat more whole grains, fruits and vegetables, lean meats and healthy fats Follow low sodium diet- read food labels Limit/ avoid fast food  Follow Up Plan: Telephone follow up appointment with care management team member scheduled for:   02/23/23  at 1030 am           Plan: Telephone follow up appointment with care management team member scheduled for: 02/23/23 at 1030 am  Irving Shows [facility], BSN [place]/ Ambulatory Care Management [phone]

## 2022-12-07 NOTE — Patient Instructions (Signed)
Visit Information  Thank you for taking time to visit with me today. Please don't hesitate to contact me if I can be of assistance to you before our next scheduled telephone appointment.  Following are the goals we discussed today:   Goals Addressed             This Visit's Progress    CCM (CHRONIC KIDNEY DISEASE) EXPECTED OUTCOME: MONITOR, SELF-MANAGE AND REDUCE SYMPTOMS OF CHRONIC KIDNEY DISEASE       Current Barriers:  Knowledge Deficits related to CKD Chronic Disease Management support and education needs related to CKD No Advanced Directives in place- Pt declines Pt reports she is concerned about her kidneys and trying to keep blood sugar, blood pressure WNL to preserve kidney function, does not drink sugary drinks Pt states she continues to work on her diet, no new concerns reported today, continues to follow up with nephrologist  Planned Interventions: Assessed the patient     understanding of chronic kidney disease    Evaluation of current treatment plan related to chronic kidney disease self management and patient's adherence to plan as established by provider      Reviewed medications with patient and discussed importance of compliance    Counseled on the importance of exercise goals with target of 150 minutes per week     Advised patient, providing education and rationale, to monitor blood pressure daily and record, calling PCP for findings outside established parameters    Discussed complications of poorly controlled blood pressure such as heart disease, stroke, circulatory complications, vision complications, kidney impairment, sexual dysfunction    Reinforced importance of staying active Reinforced low sodium diet and nutritious food choices  Symptom Management: Take medications as prescribed   Attend all scheduled provider appointments Call pharmacy for medication refills 3-7 days in advance of running out of medications Attend church or other social  activities Perform all self care activities independently  Perform IADL's (shopping, preparing meals, housekeeping, managing finances) independently Call provider office for new concerns or questions  Try to do some type of exercise daily- walking is good Continue to check blood pressure and keep a log Drink adequate fluids, preferably water  Follow Up Plan: Telephone follow up appointment with care management team member scheduled for:   02/23/23 at 1030 am       CCM (DIABETES) EXPECTED OUTCOME:  MONITOR, SELF-MANAGE AND REDUCE SYMPTOMS OF DIABETES       Current Barriers:  Knowledge Deficits related to Diabetes management Chronic Disease Management support and education needs related to Diabetes Financial Constraints.  No Advanced Directives in place- pt declines Pt reports she checks CBG daily with fasting ranges 80-170 with most readings under 130, random readings in 100's range, all under 200 Pt reports she tried to eat healthy and be mindful of carbohydrate intake at each meal and with snacks Pt reports recent AIC 8.1 on 07/21/22 now decreased to 7.7 on 10/20/22, pt is pleased trending down Patient reports she now uses CGM Freestyle [place]   Planned Interventions: Reviewed medications with patient and discussed importance of medication adherence;        Counseled on importance of regular laboratory monitoring as prescribed;        Advised patient, providing education and rationale, to check cbg twice daily and record        call provider for findings outside established parameters;       Review of patient status, including review of consultants reports, relevant laboratory and other test results,  and medications completed;       Reinforced carbohydrate modified diet Reinforced importance of working with pharmacist and using CGM consistenly Reviewed plan of care with pt,  pt feels comfortable having 10-12 week outreaches from RN care manager Reviewed all scheduled appointments  including endocrinologist Ronny Bacon 02/20/23  Symptom Management: Take medications as prescribed   Attend all scheduled provider appointments Call pharmacy for medication refills 3-7 days in advance of running out of medications Attend church or other social activities Perform all self care activities independently  Perform IADL's (shopping, preparing meals, housekeeping, managing finances) independently Call provider office for new concerns or questions  check blood sugar at prescribed times: twice daily check feet daily for cuts, sores or redness enter blood sugar readings and medication or insulin into daily log take the blood sugar log to all doctor visits take the blood sugar meter to all doctor visits trim toenails straight across fill half of plate with vegetables limit fast food meals to no more than 1 per week manage portion size prepare main meal at home 3 to 5 days each week read food labels for fat, fiber, carbohydrates and portion size set a realistic goal keep feet up while sitting wash and dry feet carefully every day wear comfortable, cotton socks Appointment with endocrinologist Ronny Bacon is 02/20/23 @ 9 am  Follow Up Plan: Telephone follow up appointment with care management team member scheduled for:   02/23/23 at 1030 am       CCM (HYPERTENSION) EXPECTED OUTCOME: MONITOR, SELF-MANAGE AND REDUCE SYMPTOMS OF HYPERTENSION       Current Barriers:  Knowledge Deficits related to Hypertension management Chronic Disease Management support and education needs related to Hypertension No Advanced Directives in place- pt declines Pt reports she lives with spouse, is independent in all aspects of her care, is trying to exercise now since her knees are feeling better,  tries to adhere to healthy diet Pt reports she checks blood pressure on occasion and readings are usually good No new concerns reported with blood pressure  Planned Interventions: Evaluation of  current treatment plan related to hypertension self management and patient's adherence to plan as established by provider;   Reviewed medications with patient and discussed importance of compliance;  Counseled on the importance of exercise goals with target of 150 minutes per week Advised patient, providing education and rationale, to monitor blood pressure daily and record, calling PCP for findings outside established parameters;  Discussed complications of poorly controlled blood pressure such as heart disease, stroke, circulatory complications, vision complications, kidney impairment, sexual dysfunction;  Reviewed all upcoming scheduled appointments Reviewed low sodium diet  Symptom Management: Take medications as prescribed   Attend all scheduled provider appointments Call pharmacy for medication refills 3-7 days in advance of running out of medications Attend church or other social activities Perform all self care activities independently  Perform IADL's (shopping, preparing meals, housekeeping, managing finances) independently Call provider office for new concerns or questions  check blood pressure weekly choose a place to take my blood pressure (home, clinic or office, retail store) write blood pressure results in a log or diary learn about high blood pressure keep a blood pressure log take blood pressure log to all doctor appointments call doctor for signs and symptoms of high blood pressure keep all doctor appointments take medications for blood pressure exactly as prescribed begin an exercise program report new symptoms to your doctor eat more whole grains, fruits and vegetables, lean meats and healthy fats  Follow low sodium diet- read food labels Limit/ avoid fast food  Follow Up Plan: Telephone follow up appointment with care management team member scheduled for:   02/23/23 at 1030 am           Our next appointment is by telephone on 02/23/23 at 1030 am  Please  call the care guide team at [phone] if you need to cancel or reschedule your appointment.   If you are experiencing a Mental Health or Behavioral Health Crisis or need someone to talk to, please call the Suicide and Crisis Lifeline: 988 call the [place] National Suicide Prevention Lifeline: 514-545-2317 or TTY: 719-342-0012 TTY 934-223-2031) to talk to a trained counselor call 1-800-273-TALK (toll free, 24 hour hotline) go to [facility] [address], [place] [phone]) call the [facility] Crisis Line: [phone] call 911   Patient verbalizes understanding of instructions and care plan provided today and agrees to view in MyChart. Active MyChart status and patient understanding of how to access instructions and care plan via MyChart confirmed with patient.     Telephone follow up appointment with care management team member scheduled for: 12.19.24 at 1030 am  Irving Shows [facility], BSN [facility]/ Ambulatory Care Management [phone]

## 2022-12-12 ENCOUNTER — Ambulatory Visit: Payer: Medicare HMO | Admitting: Nurse Practitioner

## 2022-12-13 ENCOUNTER — Emergency Department (HOSPITAL_COMMUNITY): Payer: Medicare HMO

## 2022-12-13 ENCOUNTER — Encounter (HOSPITAL_COMMUNITY): Payer: Self-pay

## 2022-12-13 ENCOUNTER — Other Ambulatory Visit: Payer: Self-pay

## 2022-12-13 ENCOUNTER — Observation Stay (HOSPITAL_COMMUNITY)
Admission: EM | Admit: 2022-12-13 | Discharge: 2022-12-15 | Disposition: A | Discharge status: Home / Self Care | Payer: Medicare HMO | Attending: Family Medicine | Admitting: Family Medicine

## 2022-12-13 DIAGNOSIS — E1169 Type 2 diabetes mellitus with other specified complication: Secondary | ICD-10-CM | POA: Diagnosis present

## 2022-12-13 DIAGNOSIS — Z79899 Other long term (current) drug therapy: Secondary | ICD-10-CM | POA: Diagnosis not present

## 2022-12-13 DIAGNOSIS — H5461 Unqualified visual loss, right eye, normal vision left eye: Secondary | ICD-10-CM | POA: Diagnosis present

## 2022-12-13 DIAGNOSIS — E1122 Type 2 diabetes mellitus with diabetic chronic kidney disease: Secondary | ICD-10-CM | POA: Insufficient documentation

## 2022-12-13 DIAGNOSIS — E782 Mixed hyperlipidemia: Secondary | ICD-10-CM | POA: Diagnosis not present

## 2022-12-13 DIAGNOSIS — H539 Unspecified visual disturbance: Secondary | ICD-10-CM | POA: Diagnosis not present

## 2022-12-13 DIAGNOSIS — Z7985 Long-term (current) use of injectable non-insulin antidiabetic drugs: Secondary | ICD-10-CM | POA: Insufficient documentation

## 2022-12-13 DIAGNOSIS — I639 Cerebral infarction, unspecified: Secondary | ICD-10-CM | POA: Diagnosis not present

## 2022-12-13 DIAGNOSIS — N184 Chronic kidney disease, stage 4 (severe): Secondary | ICD-10-CM | POA: Diagnosis not present

## 2022-12-13 DIAGNOSIS — R29818 Other symptoms and signs involving the nervous system: Secondary | ICD-10-CM | POA: Diagnosis not present

## 2022-12-13 DIAGNOSIS — I129 Hypertensive chronic kidney disease with stage 1 through stage 4 chronic kidney disease, or unspecified chronic kidney disease: Secondary | ICD-10-CM | POA: Insufficient documentation

## 2022-12-13 DIAGNOSIS — R0602 Shortness of breath: Secondary | ICD-10-CM | POA: Diagnosis not present

## 2022-12-13 DIAGNOSIS — Z794 Long term (current) use of insulin: Secondary | ICD-10-CM | POA: Insufficient documentation

## 2022-12-13 DIAGNOSIS — E1165 Type 2 diabetes mellitus with hyperglycemia: Secondary | ICD-10-CM | POA: Diagnosis not present

## 2022-12-13 DIAGNOSIS — R2689 Other abnormalities of gait and mobility: Secondary | ICD-10-CM | POA: Diagnosis not present

## 2022-12-13 DIAGNOSIS — Z6830 Body mass index (BMI) 30.0-30.9, adult: Secondary | ICD-10-CM | POA: Diagnosis not present

## 2022-12-13 DIAGNOSIS — G4733 Obstructive sleep apnea (adult) (pediatric): Secondary | ICD-10-CM

## 2022-12-13 DIAGNOSIS — G936 Cerebral edema: Secondary | ICD-10-CM | POA: Diagnosis not present

## 2022-12-13 DIAGNOSIS — I63512 Cerebral infarction due to unspecified occlusion or stenosis of left middle cerebral artery: Secondary | ICD-10-CM | POA: Diagnosis not present

## 2022-12-13 DIAGNOSIS — E669 Obesity, unspecified: Secondary | ICD-10-CM | POA: Diagnosis not present

## 2022-12-13 DIAGNOSIS — I10 Essential (primary) hypertension: Secondary | ICD-10-CM | POA: Diagnosis present

## 2022-12-13 DIAGNOSIS — R2681 Unsteadiness on feet: Secondary | ICD-10-CM | POA: Diagnosis not present

## 2022-12-13 DIAGNOSIS — I6523 Occlusion and stenosis of bilateral carotid arteries: Secondary | ICD-10-CM | POA: Diagnosis not present

## 2022-12-13 DIAGNOSIS — R0989 Other specified symptoms and signs involving the circulatory and respiratory systems: Secondary | ICD-10-CM | POA: Diagnosis not present

## 2022-12-13 DIAGNOSIS — M6281 Muscle weakness (generalized): Secondary | ICD-10-CM | POA: Diagnosis not present

## 2022-12-13 DIAGNOSIS — Z85528 Personal history of other malignant neoplasm of kidney: Secondary | ICD-10-CM | POA: Insufficient documentation

## 2022-12-13 DIAGNOSIS — D75839 Thrombocytosis, unspecified: Secondary | ICD-10-CM | POA: Diagnosis not present

## 2022-12-13 DIAGNOSIS — I152 Hypertension secondary to endocrine disorders: Secondary | ICD-10-CM | POA: Diagnosis present

## 2022-12-13 DIAGNOSIS — E1159 Type 2 diabetes mellitus with other circulatory complications: Secondary | ICD-10-CM | POA: Diagnosis present

## 2022-12-13 LAB — CBC WITH DIFFERENTIAL/PLATELET
Abs Immature Granulocytes: 0.03 10*3/uL (ref 0.00–0.07)
Basophils Absolute: 0.1 10*3/uL (ref 0.0–0.1)
Basophils Relative: 1 %
Eosinophils Absolute: 0.9 10*3/uL — ABNORMAL HIGH (ref 0.0–0.5)
Eosinophils Relative: 10 %
HCT: 37.8 % (ref 36.0–46.0)
Hemoglobin: 11.6 g/dL — ABNORMAL LOW (ref 12.0–15.0)
Immature Granulocytes: 0 %
Lymphocytes Relative: 27 %
Lymphs Abs: 2.6 10*3/uL (ref 0.7–4.0)
MCH: 26.3 pg (ref 26.0–34.0)
MCHC: 30.7 g/dL (ref 30.0–36.0)
MCV: 85.7 fL (ref 80.0–100.0)
Monocytes Absolute: 0.5 10*3/uL (ref 0.1–1.0)
Monocytes Relative: 5 %
Neutro Abs: 5.3 10*3/uL (ref 1.7–7.7)
Neutrophils Relative %: 57 %
Platelets: 424 10*3/uL — ABNORMAL HIGH (ref 150–400)
RBC: 4.41 MIL/uL (ref 3.87–5.11)
RDW: 14.6 % (ref 11.5–15.5)
WBC: 9.4 10*3/uL (ref 4.0–10.5)
nRBC: 0 % (ref 0.0–0.2)

## 2022-12-13 LAB — CBG MONITORING, ED: Glucose-Capillary: 127 mg/dL — ABNORMAL HIGH (ref 70–99)

## 2022-12-13 LAB — COMPREHENSIVE METABOLIC PANEL
ALT: 16 U/L (ref 0–44)
AST: 16 U/L (ref 15–41)
Albumin: 3.6 g/dL (ref 3.5–5.0)
Alkaline Phosphatase: 99 U/L (ref 38–126)
Anion gap: 7 (ref 5–15)
BUN: 24 mg/dL — ABNORMAL HIGH (ref 8–23)
CO2: 23 mmol/L (ref 22–32)
Calcium: 8.6 mg/dL — ABNORMAL LOW (ref 8.9–10.3)
Chloride: 107 mmol/L (ref 98–111)
Creatinine, Ser: 2.3 mg/dL — ABNORMAL HIGH (ref 0.44–1.00)
GFR, Estimated: 23 mL/min — ABNORMAL LOW (ref >60)
Glucose, Bld: 136 mg/dL — ABNORMAL HIGH (ref 70–99)
Potassium: 4.5 mmol/L (ref 3.5–5.1)
Sodium: 137 mmol/L (ref 135–145)
Total Bilirubin: 0.4 mg/dL (ref 0.3–1.2)
Total Protein: 7.6 g/dL (ref 6.5–8.1)

## 2022-12-13 LAB — LIPID PANEL
Cholesterol: 157 mg/dL (ref 0–200)
HDL: 31 mg/dL — ABNORMAL LOW (ref >40)
LDL Cholesterol: 81 mg/dL (ref 0–99)
Total CHOL/HDL Ratio: 5.1 {ratio}
Triglycerides: 223 mg/dL — ABNORMAL HIGH (ref <150)
VLDL: 45 mg/dL — ABNORMAL HIGH (ref 0–40)

## 2022-12-13 LAB — PROTIME-INR
INR: 1.1 (ref 0.8–1.2)
Prothrombin Time: 14 s (ref 11.4–15.2)

## 2022-12-13 MED ORDER — ASPIRIN 325 MG PO TABS: 325.0000 mg | ORAL_TABLET | Freq: Once | ORAL | Status: DC

## 2022-12-13 MED ORDER — CLOPIDOGREL BISULFATE 75 MG PO TABS
300.0000 mg | ORAL_TABLET | Freq: Once | ORAL | Status: AC
  Administered 2022-12-14: 300 mg via ORAL
  Filled 2022-12-13: qty 4

## 2022-12-13 MED ORDER — ACETAMINOPHEN 500 MG PO TABS
1000.0000 mg | ORAL_TABLET | Freq: Once | ORAL | Status: AC
  Administered 2022-12-13: 1000 mg via ORAL
  Filled 2022-12-13: qty 2

## 2022-12-13 MED ORDER — CLOPIDOGREL BISULFATE 75 MG PO TABS: 300.0000 mg | ORAL_TABLET | Freq: Once | ORAL | Status: DC

## 2022-12-13 MED ORDER — ASPIRIN 81 MG PO CHEW
324.0000 mg | CHEWABLE_TABLET | Freq: Once | ORAL | Status: AC
  Administered 2022-12-14: 324 mg via ORAL
  Filled 2022-12-13: qty 4

## 2022-12-13 NOTE — ED Provider Notes (Signed)
[place] EMERGENCY DEPARTMENT AT [facility] Provider Note  CSN: [id number] Arrival date & time: 12/13/22 1846  Chief Complaint(s) Eye Problem and Nasal Congestion  HPI Brooke [name] is a 67 y.o. female history of diabetes, hypertension, CKD, hyperlipidemia presenting with right eye vision loss.  Patient reports today she had episode where she was on the phone, looking at some Medicare documents, suddenly seem to have loss of vision in part of her right eye, this lasted around 30 minutes.  When she closed the affected eye her vision was normal.  Denies any pain.  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  No numbness or tingling, speech changes, facial droop, trouble swallowing, weakness or anything else during this episode.  Her symptoms resolved and reports now her vision is normal.  She also reports cough for 3 or 4 months.  Reports that she sometimes uses an inhaler.  She wanted to get this checked out as well.  Not productive.  No fevers.   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   Thickened endometrium 03/24/2021   Vaginal bleeding 03/24/2021   Difficulty sleeping 02/10/2021   Positive colorectal cancer screening using Cologuard test 12/10/2020   Vitamin D insufficiency 11/27/2020   Diabetic nephropathy (HCC)    CKD stage 3 due  to type 2 diabetes mellitus (HCC) 03/14/2019   Type 2 diabetes mellitus with hyperglycemia, with long-term current use of insulin (HCC) 03/13/2019   Hyperlipidemia associated with type 2 diabetes mellitus (HCC) 03/13/2019   Hypertension associated with diabetes (HCC) 03/13/2019   Obesity (BMI 30-39.9) 03/13/2019   Chronic bronchitis (HCC) 03/13/2019   Home Medication(s) Prior to Admission medications   Medication Sig Start Date End Date Taking? Authorizing Provider  acetaminophen (TYLENOL) 500 MG tablet Take 1,000 mg by mouth every 8 (eight) hours as needed for moderate pain.    [provider]  albuterol (VENTOLIN HFA) 108 (90 Base) MCG/ACT inhaler Inhale 2 puffs into the lungs every 6 (six) hours as needed for wheezing or shortness of breath. 06/26/19   [name], FNP  amLODipine (NORVASC) 10 MG tablet Take 1 tablet (10 mg total) by mouth daily. 07/21/22   [name], [name], FNP  atenolol (TENORMIN) 100 MG tablet Take 1 tablet (100 mg total) by mouth every morning. 07/21/22   [name], [name], FNP  atorvastatin (LIPITOR) 40 MG tablet Take 1 tablet (40 mg total) by mouth daily. 10/25/22   [name], [name], FNP  budesonide-formoterol (SYMBICORT) 80-4.5 MCG/ACT inhaler Inhale 2 puffs into the lungs 2 (two) times daily. 11/10/22   [name], [name], FNP  Camphor-Menthol-Methyl Sal (SALONPAS) 3.03-12-08 % PTCH Apply 1 patch topically daily as needed (pain).    [provider]  Cholecalciferol 25 MCG (1000 UT) capsule Take  1,000 Units by mouth daily. 01/07/21   [provider]  Continuous Glucose Receiver (FREESTYLE LIBRE 3 READER) DEVI 1 Device by Does not apply route as directed. with compatible Freestyle Libre 3 sensor to monitor glucose continuously. DX:E11.65 09/01/22   [name], [name], FNP  Continuous Glucose Sensor (FREESTYLE LIBRE 3 SENSOR) MISC Use 1 device subcutaneously as directed to monitor glucose continuously every 14 days.  DX:E11.65 09/01/22   [name], [name], FNP  docusate sodium (COLACE) 100 MG capsule Take 1 capsule (100 mg total) by mouth 2 (two) times daily. 08/04/21   Harrie Foreman, PA-C  Dulaglutide (TRULICITY) 4.5 MG/0.5ML SOPN Inject 4.5 mg as directed once a week. 05/26/22   [name], FNP  hydrochlorothiazide (HYDRODIURIL) 25 MG tablet Take 1 tablet (25 mg total) by mouth daily. 07/21/22   [name], [name], FNP  Insulin Glargine (BASAGLAR KWIKPEN) 100 UNIT/ML Inject 37 Units into the skin daily. 09/01/22   [name], [name], FNP  insulin lispro (HUMALOG KWIKPEN) 100 UNIT/ML KwikPen Inject 10-15 Units into the skin See admin instructions. 10 units WITH BREAKFAST AND LUNCH, TAKE 15 units at [facility] 09/01/22   [name], [name], FNP  Insulin Pen Needle 31G X 6 MM MISC Use to inject insulin 4 times daily as directed. DX: E11.65 05/26/22   [name], FNP  lisinopril (ZESTRIL) 10 MG tablet Take by mouth. 03/22/22   [provider]  Menthol, Topical Analgesic, (BIOFREEZE) 4 % GEL Apply 1 application. topically daily as needed (pain).    [provider]  promethazine-dextromethorphan (PROMETHAZINE-DM) 6.25-15 MG/5ML syrup Take 5 mLs by mouth 4 (four) times daily as needed. 06/13/22   [provider]                                                                                                                                    Past Surgical History Past Surgical History:  Procedure Laterality Date   BREAST SURGERY Right 1974   OPERATIVE ULTRASOUND N/A 08/04/2021   Procedure: OPERATIVE ULTRASOUND;  Surgeon: Sebastian Ache, MD;  Location: [facility];  Service: Urology;  Laterality: N/A;   ROBOTIC ASSITED PARTIAL NEPHRECTOMY Right 08/04/2021   Procedure: XI ROBOTIC ASSITED RADICAL NEPHRECTOMY;  Surgeon: Sebastian Ache, MD;  Location: [facility];  Service: Urology;  Laterality: Right;   Family History Family History  Problem Relation Age of Onset   Diabetes Mother     Congestive Heart Failure Mother    Kidney disease Father    Diabetes Sister    Diabetes Brother    Hypertension Daughter    Hypertension Sister    Cervical cancer Sister     Social History Social History   Tobacco Use   Smoking status: Never   Smokeless tobacco: Never  Vaping Use   Vaping status: Never Used  Substance Use Topics   Alcohol use: No   Drug use: No   Allergies Jardiance [empagliflozin], Chauncey Mann [  finerenone], and Metformin and related  Review of Systems Review of Systems  All other systems reviewed and are negative.   Physical Exam Vital Signs  I have reviewed the triage vital signs BP (!) 155/73   Pulse 77   Temp 98 F (36.7 C) (Oral)   Resp 18   Ht 5\' 4"  (1.626 m)   Wt 99.8 kg   LMP 03/16/2012   SpO2 99%   BMI 37.76 kg/m  Physical Exam Vitals and nursing note reviewed.  Constitutional:      General: She is not in acute distress.    Appearance: She is well-developed.  HENT:     Head: Normocephalic and atraumatic.     Mouth/Throat:     Mouth: Mucous membranes are moist.  Eyes:     Pupils: Pupils are equal, round, and reactive to light.  Cardiovascular:     Rate and Rhythm: Normal rate and regular rhythm.     Heart sounds: No murmur heard. Pulmonary:     Effort: Pulmonary effort is normal. No respiratory distress.     Breath sounds: Normal breath sounds.     Comments: Trace wheeze  Abdominal:     General: Abdomen is flat.     Palpations: Abdomen is soft.     Tenderness: There is no abdominal tenderness.  Musculoskeletal:        General: No tenderness.     Right lower leg: No edema.     Left lower leg: No edema.  Skin:    General: Skin is warm and dry.  Neurological:     General: No focal deficit present.     Mental Status: She is alert. Mental status is at baseline.     Comments: Cranial nerves II through XII intact, strength 5 out of 5 in the bilateral upper and lower extremities, no sensory deficit to light touch, no dysmetria  on finger-nose-finger testing, ambulatory with steady gait.   Psychiatric:        Mood and Affect: Mood normal.        Behavior: Behavior normal.     ED Results and Treatments Labs (all labs ordered are listed, but only abnormal results are displayed) Labs Reviewed  COMPREHENSIVE METABOLIC PANEL - Abnormal; Notable for the following components:      Result Value   Glucose, Bld 136 (*)    BUN 24 (*)    Creatinine, Ser 2.30 (*)    Calcium 8.6 (*)    GFR, Estimated 23 (*)    All other components within normal limits  CBC WITH DIFFERENTIAL/PLATELET - Abnormal; Notable for the following components:   Hemoglobin 11.6 (*)    Platelets 424 (*)    Eosinophils Absolute 0.9 (*)    All other components within normal limits  PROTIME-INR  SEDIMENTATION RATE  C-REACTIVE PROTEIN  Radiology DG Chest 2 View  Result Date: 12/13/2022 CLINICAL DATA:  Chest congestion EXAM: CHEST - 2 VIEW COMPARISON:  06/13/2022 FINDINGS: The heart size and mediastinal contours are within normal limits. Both lungs are clear. The visualized skeletal structures are unremarkable. IMPRESSION: No active cardiopulmonary disease. Electronically Signed   By: Helyn Numbers M.D.   On: 12/13/2022 19:42    Pertinent labs & imaging results that were available during my care of the patient were reviewed by me and considered in my medical decision making (see MDM for details).  Medications Ordered in ED Medications - No data to display                                                                                                                                   Procedures Ultrasound ED Ocular  Date/Time: 12/13/2022 11:11 PM  Performed by: [name], MD Authorized by: [name], MD   PROCEDURE DETAILS:    Indications: visual change     Assessed:  Right eye   Right eye axial view:  obtained     Right eye sagittal view: obtained     Images: archived     Limitations:  None RIGHT EYE FINDINGS:     no evidence of retinal detachment of the right eye   (including critical care time)  Medical Decision Making / ED Course   MDM:  67 year old presenting with episode of loss of vision.  Patient well-appearing, physical exam reassuring.  Neurologic exam currently normal.  No visual field deficit on exam.  Does have some trace wheezing.  Symptoms concerning for amaurosis fugax.  Currently she reports her vision is back to normal.  She has no focal neurologic deficit.  Discussed with Dr. Baron Sane who agrees patient should come in for TIA workup.  She will need to have CT head.  She will need MRIs tomorrow.  GFR is too low for contrasted study so we will need non contrast head CT.  Signed out to Dr. Pilar Plate pending CT head ultimately admission.   She also reports Chronic cough, her chest x-ray is clear.  Unclear cause of this but doubt pneumonia or other dangerous process.  He has some trace wheezing and uses albuterol which helps.  May have some component of reactive airway disease or bronchospasm.      Lab Tests: -I ordered, reviewed, and interpreted labs.   The pertinent results include:   Labs Reviewed  COMPREHENSIVE METABOLIC PANEL - Abnormal; Notable for the following components:      Result Value   Glucose, Bld 136 (*)    BUN 24 (*)    Creatinine, Ser 2.30 (*)    Calcium 8.6 (*)    GFR, Estimated 23 (*)    All other components within normal limits  CBC WITH DIFFERENTIAL/PLATELET - Abnormal; Notable for the following components:   Hemoglobin 11.6 (*)    Platelets 424 (*)  Eosinophils Absolute 0.9 (*)    All other components within normal limits  PROTIME-INR  SEDIMENTATION RATE  C-REACTIVE PROTEIN    Notable for CKD    Medicines ordered and prescription drug management: No orders of the defined types were placed in this encounter.   -I have reviewed  the patients home medicines and have made adjustments as needed   Consultations Obtained: I requested consultation with the Neurologist,  and discussed lab and imaging findings as well as pertinent plan - they recommend: admission   Cardiac Monitoring: The patient was maintained on a cardiac monitor.  I personally viewed and interpreted the cardiac monitored which showed an underlying rhythm of: NSR  Social Determinants of Health:  Diagnosis or treatment significantly limited by social determinants of health: obesity   Reevaluation: After the interventions noted above, I reevaluated the patient and found that their symptoms have resolved  Co morbidities that complicate the patient evaluation  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    Final Clinical Impression(s) / ED Diagnoses Final diagnoses:  Transient vision disturbance, right     This chart was dictated using voice recognition software.  Despite best efforts to proofread,  errors can occur which can change the documentation meaning.    [name], MD 12/13/22 2312

## 2022-12-13 NOTE — ED Provider Notes (Addendum)
  Provider Note MRN:  [id number]  Arrival date & time: 12/13/22    ED Course and Medical Decision Making  Assumed care from Dr. Suezanne Jacquet at shift change.  Concern for possible amaurosis fugax, plan is for TIA admission.  11:50 PM update: Called by radiology, CT head is positive for subacute occipital stroke.  Will admit to medicine.  .Critical Care  Performed by: Sabas Sous, MD Authorized by: Sabas Sous, MD   Critical care provider statement:    Critical care time (minutes):  35   Critical care was necessary to treat or prevent imminent or life-threatening deterioration of the following conditions: Acute ischemic stroke.   Critical care was time spent personally by me on the following activities:  Development of treatment plan with patient or surrogate, discussions with consultants, evaluation of patient's response to treatment, examination of patient, ordering and review of laboratory studies, ordering and review of radiographic studies, ordering and performing treatments and interventions, pulse oximetry, re-evaluation of patient's condition and review of old charts   Final Clinical Impressions(s) / ED Diagnoses     ICD-10-CM   1. Transient vision disturbance, right  H53.9     2. Acute ischemic stroke [facility])  I63.9       ED Discharge Orders     None       Discharge Instructions   None     Elmer Sow. Pilar Plate, MD [facility] Emergency Medicine [facility] [email or web address]    Sabas Sous, MD 12/13/22 2352    Sabas Sous, MD 12/13/22 2358

## 2022-12-13 NOTE — ED Triage Notes (Signed)
Side Vision in right eye was slightly lost for about 30 minutes per pt. Currently vision is fine stated by the pt. Denies headaches, nausea, vomiting, and pain.   Has been congested for months. Started taking mucinex 3-4 days ago. None today. Uses inhaler daily.

## 2022-12-13 NOTE — Plan of Care (Signed)
Discussed with Dr. Suezanne Jacquet, curbside recommendations based on info provided and brief chart review  30 min monocular vision loss in the right eye, now at baseline, agree this is concerning for amaurosis fugax and merits stroke/TIA admission  Recommendations: Check for any signs/symptoms of GCA or PMR on history  ESR/CRP in addition to A1c and lipid panel  MRA head and neck and carotid duplex due to CKD Permissive hypertension until MRI confirms no stroke or after 24-48 hours if there is stroke Plavix 300 mg load followed by 75 mg daily if no contraindications Aspirin 300 - 325 mg load followed by 81 mg daily if no contraindications ECHO, Tele No need for PT/OT/SLP if at baseline Full neuro consult by video tomorrow, please consult neurohospitalist on for [facility] Okay to admit to [place] if stable and at baseline and no critical issue on CTA   Brooke Dare, MD-PhD [facility] [phone]  These are curbside recommendations based upon the information readily available in the chart on brief review as well as history and examination information provided to me by requesting provider and do not replace a full detailed consult

## 2022-12-14 ENCOUNTER — Observation Stay (HOSPITAL_COMMUNITY): Payer: Medicare HMO

## 2022-12-14 ENCOUNTER — Observation Stay (HOSPITAL_BASED_OUTPATIENT_CLINIC_OR_DEPARTMENT_OTHER): Payer: Medicare HMO

## 2022-12-14 DIAGNOSIS — E669 Obesity, unspecified: Secondary | ICD-10-CM | POA: Diagnosis not present

## 2022-12-14 DIAGNOSIS — R29818 Other symptoms and signs involving the nervous system: Secondary | ICD-10-CM | POA: Diagnosis not present

## 2022-12-14 DIAGNOSIS — I6523 Occlusion and stenosis of bilateral carotid arteries: Secondary | ICD-10-CM | POA: Diagnosis not present

## 2022-12-14 DIAGNOSIS — Z794 Long term (current) use of insulin: Secondary | ICD-10-CM | POA: Diagnosis not present

## 2022-12-14 DIAGNOSIS — I6389 Other cerebral infarction: Secondary | ICD-10-CM | POA: Diagnosis not present

## 2022-12-14 DIAGNOSIS — R0602 Shortness of breath: Secondary | ICD-10-CM | POA: Insufficient documentation

## 2022-12-14 DIAGNOSIS — I10 Essential (primary) hypertension: Secondary | ICD-10-CM

## 2022-12-14 DIAGNOSIS — E1165 Type 2 diabetes mellitus with hyperglycemia: Secondary | ICD-10-CM

## 2022-12-14 DIAGNOSIS — D75839 Thrombocytosis, unspecified: Secondary | ICD-10-CM | POA: Insufficient documentation

## 2022-12-14 DIAGNOSIS — E782 Mixed hyperlipidemia: Secondary | ICD-10-CM | POA: Diagnosis not present

## 2022-12-14 DIAGNOSIS — I639 Cerebral infarction, unspecified: Secondary | ICD-10-CM | POA: Diagnosis not present

## 2022-12-14 DIAGNOSIS — E785 Hyperlipidemia, unspecified: Secondary | ICD-10-CM | POA: Diagnosis not present

## 2022-12-14 DIAGNOSIS — I6782 Cerebral ischemia: Secondary | ICD-10-CM | POA: Diagnosis not present

## 2022-12-14 DIAGNOSIS — N184 Chronic kidney disease, stage 4 (severe): Secondary | ICD-10-CM | POA: Insufficient documentation

## 2022-12-14 LAB — CBC
HCT: 33.7 % — ABNORMAL LOW (ref 36.0–46.0)
Hemoglobin: 10.4 g/dL — ABNORMAL LOW (ref 12.0–15.0)
MCH: 26.4 pg (ref 26.0–34.0)
MCHC: 30.9 g/dL (ref 30.0–36.0)
MCV: 85.5 fL (ref 80.0–100.0)
Platelets: 375 10*3/uL (ref 150–400)
RBC: 3.94 MIL/uL (ref 3.87–5.11)
RDW: 14.2 % (ref 11.5–15.5)
WBC: 9.4 10*3/uL (ref 4.0–10.5)
nRBC: 0 % (ref 0.0–0.2)

## 2022-12-14 LAB — COMPREHENSIVE METABOLIC PANEL
ALT: 16 U/L (ref 0–44)
AST: 13 U/L — ABNORMAL LOW (ref 15–41)
Albumin: 3.1 g/dL — ABNORMAL LOW (ref 3.5–5.0)
Alkaline Phosphatase: 88 U/L (ref 38–126)
Anion gap: 11 (ref 5–15)
BUN: 24 mg/dL — ABNORMAL HIGH (ref 8–23)
CO2: 20 mmol/L — ABNORMAL LOW (ref 22–32)
Calcium: 8.4 mg/dL — ABNORMAL LOW (ref 8.9–10.3)
Chloride: 105 mmol/L (ref 98–111)
Creatinine, Ser: 2.29 mg/dL — ABNORMAL HIGH (ref 0.44–1.00)
GFR, Estimated: 23 mL/min — ABNORMAL LOW (ref >60)
Glucose, Bld: 134 mg/dL — ABNORMAL HIGH (ref 70–99)
Potassium: 4 mmol/L (ref 3.5–5.1)
Sodium: 136 mmol/L (ref 135–145)
Total Bilirubin: 0.5 mg/dL (ref 0.3–1.2)
Total Protein: 6.7 g/dL (ref 6.5–8.1)

## 2022-12-14 LAB — HEMOGLOBIN A1C
Hgb A1c MFr Bld: 8.2 % — ABNORMAL HIGH (ref 4.8–5.6)
Mean Plasma Glucose: 188.64 mg/dL

## 2022-12-14 LAB — ECHOCARDIOGRAM COMPLETE
Area-P 1/2: 3 cm2
Height: 64 in
S' Lateral: 4.4 cm
Weight: 3545 [oz_av]

## 2022-12-14 LAB — HIV ANTIBODY (ROUTINE TESTING W REFLEX): HIV Screen 4th Generation wRfx: NONREACTIVE

## 2022-12-14 LAB — GLUCOSE, CAPILLARY
Glucose-Capillary: 133 mg/dL — ABNORMAL HIGH (ref 70–99)
Glucose-Capillary: 177 mg/dL — ABNORMAL HIGH (ref 70–99)
Glucose-Capillary: 244 mg/dL — ABNORMAL HIGH (ref 70–99)

## 2022-12-14 LAB — SEDIMENTATION RATE: Sed Rate: 91 mm/h — ABNORMAL HIGH (ref 0–22)

## 2022-12-14 LAB — C-REACTIVE PROTEIN: CRP: 0.5 mg/dL (ref <1.0)

## 2022-12-14 MED ORDER — INSULIN ASPART 100 UNIT/ML IJ SOLN
0.0000 [IU] | Freq: Three times a day (TID) | INTRAMUSCULAR | Status: DC
  Administered 2022-12-14: 2 [IU] via SUBCUTANEOUS
  Administered 2022-12-15: 1 [IU] via SUBCUTANEOUS

## 2022-12-14 MED ORDER — ONDANSETRON HCL 4 MG PO TABS: 4.0000 mg | ORAL_TABLET | Freq: Four times a day (QID) | ORAL | Status: DC | PRN

## 2022-12-14 MED ORDER — GADOBUTROL 1 MMOL/ML IV SOLN
9.0000 mL | Freq: Once | INTRAVENOUS | Status: AC | PRN
  Administered 2022-12-14: 9 mL via INTRAVENOUS

## 2022-12-14 MED ORDER — ALBUTEROL SULFATE HFA 108 (90 BASE) MCG/ACT IN AERS: 2.0000 | INHALATION_SPRAY | Freq: Four times a day (QID) | RESPIRATORY_TRACT | Status: DC | PRN

## 2022-12-14 MED ORDER — ALBUTEROL SULFATE (2.5 MG/3ML) 0.083% IN NEBU: 2.5000 mg | INHALATION_SOLUTION | Freq: Four times a day (QID) | RESPIRATORY_TRACT | Status: DC | PRN

## 2022-12-14 MED ORDER — ONDANSETRON HCL 4 MG/2ML IJ SOLN: 4.0000 mg | Freq: Four times a day (QID) | INTRAMUSCULAR | Status: DC | PRN

## 2022-12-14 MED ORDER — ACETAMINOPHEN 325 MG PO TABS: 650.0000 mg | ORAL_TABLET | Freq: Four times a day (QID) | ORAL | Status: DC | PRN

## 2022-12-14 MED ORDER — ACETAMINOPHEN 650 MG RE SUPP: 650.0000 mg | Freq: Four times a day (QID) | RECTAL | Status: DC | PRN

## 2022-12-14 MED ORDER — ATORVASTATIN CALCIUM 40 MG PO TABS: 40.0000 mg | ORAL_TABLET | Freq: Every day | ORAL | Status: DC

## 2022-12-14 MED ORDER — MOMETASONE FURO-FORMOTEROL FUM 100-5 MCG/ACT IN AERO
2.0000 | INHALATION_SPRAY | Freq: Two times a day (BID) | RESPIRATORY_TRACT | Status: DC
  Administered 2022-12-14 – 2022-12-15 (×3): 2 via RESPIRATORY_TRACT
  Filled 2022-12-14: qty 8.8

## 2022-12-14 MED ORDER — ASPIRIN 81 MG PO TBEC
81.0000 mg | DELAYED_RELEASE_TABLET | Freq: Every day | ORAL | Status: DC
  Administered 2022-12-15: 81 mg via ORAL
  Filled 2022-12-14: qty 1

## 2022-12-14 MED ORDER — ATORVASTATIN CALCIUM 40 MG PO TABS
80.0000 mg | ORAL_TABLET | Freq: Every day | ORAL | Status: DC
  Administered 2022-12-14 – 2022-12-15 (×2): 80 mg via ORAL
  Filled 2022-12-14 (×2): qty 2

## 2022-12-14 MED ORDER — CLOPIDOGREL BISULFATE 75 MG PO TABS
75.0000 mg | ORAL_TABLET | Freq: Every day | ORAL | Status: DC
  Administered 2022-12-15: 75 mg via ORAL
  Filled 2022-12-14: qty 1

## 2022-12-14 NOTE — Evaluation (Signed)
Occupational Therapy Evaluation Patient Details Name: Brooke [name] MRN: [id number] DOB: 16-Jul-1955 Today's Date: 12/14/2022   History of Present Illness Brooke [name] is a 67 y.o. female with medical history significant of type 2 diabetes mellitus, chronic kidney disease stage IV, history of renal cell carcinoma, obesity, hypertension, hyperlipidemia who presents to the emergency department who presents with transitory loss of vision for about 30 minutes in the right eye.  She has since returned to baseline, but she was concerned that the symptoms may be related to stroke, so she presented to the ED for further evaluation and management.  She denies chest pain, shortness of breath, weakness, numbness or tingling of any extremity. (per DO)   Clinical Impression   Pt  agreeable to OT and PT co-evaluation. Pt reports feeling at/near baseline function. Pt's vision has returned to normal. Pt is independently able to ambulate in the room and manage lower body clothing. WFL B UE strength and coordination. Pt is not recommended for further acute OT services and will be discharged to care of nursing staff for remaining length of stay.        If plan is discharge home, recommend the following:      Functional Status Assessment  Patient has not had a recent decline in their functional status  Equipment Recommendations  None recommended by OT           Precautions / Restrictions Precautions Precautions: Fall Restrictions Weight Bearing Restrictions: No      Mobility Bed Mobility Overal bed mobility: Independent                  Transfers Overall transfer level: Independent                        Balance Overall balance assessment: Independent                                         ADL either performed or assessed with clinical judgement   ADL Overall ADL's : Independent                                              Vision Baseline Vision/History: 1 Wears glasses Ability to See in Adequate Light: 1 Impaired Patient Visual Report: Other (comment) (R eye "not picking up"; since has returned to normal.) Vision Assessment?: No apparent visual deficits     Perception Perception: Not tested       Praxis Praxis: WFL       Pertinent Vitals/Pain Pain Assessment Pain Assessment: No/denies pain     Extremity/Trunk Assessment Upper Extremity Assessment Upper Extremity Assessment: Overall WFL for tasks assessed   Lower Extremity Assessment Lower Extremity Assessment: Defer to PT evaluation   Cervical / Trunk Assessment Cervical / Trunk Assessment: Normal   Communication Communication Communication: No apparent difficulties   Cognition Arousal: Alert Behavior During Therapy: WFL for tasks assessed/performed Overall Cognitive Status: Within Functional Limits for tasks assessed  Home Living Family/patient expects to be discharged to:: Private residence Living Arrangements: Spouse/significant other Available Help at Discharge: Family;Available PRN/intermittently Type of Home: Mobile home Home Access: Stairs to enter Entrance Stairs-Number of Steps: 4 Entrance Stairs-Rails: Can reach both Home Layout: One level     Bathroom Shower/Tub: Chief Strategy Officer: Standard Bathroom Accessibility: No   Home Equipment: Information systems manager          Prior Functioning/Environment Prior Level of Function : Independent/Modified Independent             Mobility Comments: Tourist information centre manager without AD ADLs Comments: Independent                                Co-evaluation PT/OT/SLP Co-Evaluation/Treatment: Yes Reason for Co-Treatment: To address functional/ADL transfers   OT goals addressed during session: ADL's and self-care      AM-PAC OT "6 Clicks" Daily Activity     Outcome Measure  Help from another person eating meals?: None Help from another person taking care of personal grooming?: None Help from another person toileting, which includes using toliet, bedpan, or urinal?: None Help from another person bathing (including washing, rinsing, drying)?: None Help from another person to put on and taking off regular upper body clothing?: None Help from another person to put on and taking off regular lower body clothing?: None 6 Click Score: 24   End of Session    Activity Tolerance: Patient tolerated treatment well Patient left: in bed;with call bell/phone within reach  OT Visit Diagnosis: Other symptoms and signs involving the nervous system (Q46.962)                Time: 9528-4132 OT Time Calculation (min): 11 min Charges:  OT General Charges $OT Visit: 1 Visit OT Evaluation $OT Eval Low Complexity: 1 Low  Makailey Hodgkin OT, MOT  Danie Chandler 12/14/2022, 9:28 AM

## 2022-12-14 NOTE — Progress Notes (Signed)
   12/14/22 1118  TOC Brief Assessment  Insurance and Status Reviewed  Patient has primary care physician Yes  Home environment has been reviewed Home with Family  Prior level of function: indpendent  Prior/Current Home Services No current home services  Social Determinants of Health Reivew SDOH reviewed no interventions necessary  Readmission risk has been reviewed Yes  Transition of care needs no transition of care needs at this time    PT has no follow up needed. ECHO and MRI pending.   Transition of Care Department [facility]) has reviewed patient and no TOC needs have been identified at this time. We will continue to monitor patient advancement through interdisciplinary progression rounds. If new patient transition needs arise, please place a TOC consult.

## 2022-12-14 NOTE — Hospital Course (Addendum)
Brooke [name] is a 66 y.o. female with medical history significant of type 2 diabetes mellitus, chronic kidney disease stage IV, history of renal cell carcinoma, obesity, hypertension, hyperlipidemia who presents to the emergency department who presents with transitory loss of vision for about 30 minutes in the right eye.  She has since returned to baseline, but she was concerned that the symptoms may be related to stroke, so she presented to the ED for further evaluation and management.   ED Course:  BP was 167/77, other vital signs were within normal range.  Workup in the ED showed normal CBC except for platelets of 424, BMP was normal except for BUN of 24 and creatinine of 2.30.  Triglycerides 223, HDL 31, VLDL 45, sed rate 91 CT head without contrast showed acute to subacute left temporal occipital lobe infarction.  No intracranial hemorrhage. Chest x-ray showed no active cardiopulmonary disease Teleneurology was consulted and recommended further stroke workup.  Plavix 300 mg followed by 75 mg daily and aspirin 300 to 325 mg load followed by 2 mg daily if no contraindications were recommended.   Hospitalist was asked to admit patient for further evaluation and management.  Assessment/Plan Present on Admission:  Acute ischemic stroke [facility])  Essential hypertension  Mixed hyperlipidemia  Obesity (BMI 30-39.9)      Acute ischemic stroke  -Possible TIA, improved symptoms, reportedly no facial asymmetry numbness denies of asymmetric weaknesses, Improved right visual deficit  Bilateral carotid ultrasound >>>  Echocardiogram >>>  MRI of brain without contrast>>>   Continue aspirin, Plavix and statin Continue fall precautions and neuro checks Lipid panel, LDL 81, triglyceride 223 Hemoglobin A1c: 8.2  Continue PT/SLP/OT eval and treat Bedside swallow eval by nursing -past, tolerating p.o.  Teleneurology consult was appreciated Neurology will be consulted admission await further  recommendations   Chronic kidney disease stage IV Creatinine 2.30, this is within baseline range Renally adjust medications, avoid nephrotoxic agents/dehydration/hypotension Lab Results  Component Value Date   CREATININE 2.29 (H) 12/14/2022   CREATININE 2.30 (H) 12/13/2022   CREATININE 2.44 (H) 10/20/2022      Thrombocytosis possibly reactive Platelets 424, continue to monitor platelet levels   Essential hypertension  Antihypertensives PRN if Blood pressure is greater than 220/120 or there is a concern for End organ damage/contraindications for permissive HTN. If blood pressure is greater than 220/120 give labetalol PO or IV or Vasotec IV with a goal of 15% reduction in BP during the first 24 hours.   Mixed hyperlipidemia Continue Lipitor   Type 2 diabetes mellitus with hyperglycemia Hemoglobin A1c on 10/20/2022 was 7.7>>8.2 now  Continue ISS and hypoglycemia protocol   Shortness of breath/wheezing Patient complained of chronic history of wheezing without an official diagnosis of asthma or COPD Continue albuterol, Dulera per home regimen   Obesity (BMI 30.03) Diet and lifestyle modification

## 2022-12-14 NOTE — Consult Note (Signed)
I connected with  [name] on 12/14/22 by a video enabled telemedicine application and verified that I am speaking with the correct person using two identifiers.   I discussed the limitations of evaluation and management by telemedicine. The patient expressed understanding and agreed to proceed.   Location of patient: [facility] Location of physician: [facility]  Neurology Consultation Reason for Consult: Stroke Referring Physician: Dr. Frankey Shown  CC: Vision loss in right eye  History is obtained from: Patient, chart review  HPI: Brooke [name] is a 67 y.o. female with past medical history of hypertension, diabetes, renal cell carcinoma status post nephrectomy who presented with transient vision loss in right visual field. States she was watching tv at 1615 yesterday and noticed she couldn't see out of the right side of her eyes. States she has had something similar happen with left eye where it looked wavy but not loss of vision.States her vision returned to baseline after 30 mins. Denies any headache, weakness, numbness, speech disturbance. Not on AP/AC  Last known normal: 12/13/2022 1615 Event happened at home No tPA as outside window as symptoms resolved quickly No thrombectomy as no LVO mRS 0   ROS: All other systems reviewed and negative except as noted in the HPI.   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 She has never used smokeless tobacco. She reports that she does not drink alcohol and does not use drugs.   Medications Prior to Admission  Medication Sig Dispense Refill Last Dose   acetaminophen (TYLENOL) 500 MG tablet Take 1,000 mg by mouth every 8 (eight) hours as needed for moderate pain.      albuterol (VENTOLIN HFA) 108 (90 Base) MCG/ACT inhaler Inhale 2 puffs into the lungs every 6 (six) hours as needed for wheezing or shortness of breath. 18 g 2    amLODipine (NORVASC) 10 MG tablet Take 1 tablet (10 mg total) by mouth daily. 90 tablet 1    atenolol (TENORMIN) 100 MG tablet Take 1 tablet (100 mg total) by mouth every morning. 90 tablet 1    atorvastatin (LIPITOR) 40 MG tablet Take 1 tablet (40 mg total) by mouth daily. 30 tablet 2    budesonide-formoterol (SYMBICORT) 80-4.5 MCG/ACT inhaler Inhale 2 puffs into the lungs 2 (two) times daily. 1 each 3    Camphor-Menthol-Methyl Sal (SALONPAS) 3.03-12-08 % PTCH Apply 1 patch topically daily as needed (pain).      Cholecalciferol 25 MCG (1000 UT) capsule Take 1,000 Units by mouth daily.      Continuous Glucose Receiver (FREESTYLE LIBRE 3 READER)  DEVI 1 Device by Does not apply route as directed. with compatible Freestyle Libre 3 sensor to monitor glucose continuously. DX:E11.65 1 each 1    Continuous Glucose Sensor (FREESTYLE LIBRE 3 SENSOR) MISC Use 1 device subcutaneously as directed to monitor glucose continuously every 14 days. DX:E11.65 2 each 5    docusate sodium (COLACE) 100 MG capsule Take 1 capsule (100 mg total) by mouth 2 (two) times daily.      Dulaglutide (TRULICITY) 4.5 MG/0.5ML SOPN Inject 4.5 mg as directed once a week. 6 mL 3    hydrochlorothiazide (HYDRODIURIL) 25 MG tablet Take 1 tablet (25 mg total) by mouth daily. 90  tablet 1    Insulin Glargine (BASAGLAR KWIKPEN) 100 UNIT/ML Inject 37 Units into the skin daily. 45 mL 3    insulin lispro (HUMALOG KWIKPEN) 100 UNIT/ML KwikPen Inject 10-15 Units into the skin See admin instructions. 10 units WITH BREAKFAST AND LUNCH, TAKE 15 units at DINNER 45 mL 5    Insulin Pen Needle 31G X 6 MM MISC Use to inject insulin 4 times daily as directed. DX: E11.65 400 each 11    lisinopril (ZESTRIL) 10 MG tablet Take by mouth.      Menthol, Topical Analgesic, (BIOFREEZE) 4 % GEL Apply 1 application. topically daily as needed (pain).      promethazine-dextromethorphan (PROMETHAZINE-DM) 6.25-15 MG/5ML syrup Take 5 mLs by mouth 4 (four) times daily as needed.         Exam: Current vital signs: BP (!) 156/73   Pulse 74   Temp 98.8 F (37.1 C)   Resp 18   Ht 5\' 4"  (1.626 m)   Wt 100.5 kg   LMP 03/16/2012   SpO2 98%   BMI 38.03 kg/m  Vital signs in last 24 hours: Temp:  [97.8 F (36.6 C)-99 F (37.2 C)] 98.8 F (37.1 C) (10/09 0626) Pulse Rate:  [70-81] 74 (10/09 0626) Resp:  [16-18] 18 (10/09 0626) BP: (106-169)/(55-89) 156/73 (10/09 0626) SpO2:  [96 %-100 %] 98 % (10/09 0750) Weight:  [99.8 kg-100.5 kg] 100.5 kg (10/09 0226)   Physical Exam  Constitutional: Appears well-developed and well-nourished.  Psych: Affect appropriate to situation Neuro: Aox3, No aphasia, CN 2-12 grossly intact ( visual field exam limited via tele), antigravity strength in all extremities without drift, sensory intact, FTN intact  NIHSS 0  I have reviewed labs in epic and the results pertinent to this consultation are: CBC:  Recent Labs  Lab 12/13/22 2227 12/14/22 0712  WBC 9.4 9.4  NEUTROABS 5.3  --   HGB 11.6* 10.4*  HCT 37.8 33.7*  MCV 85.7 85.5  PLT 424* 375    Basic Metabolic Panel:  Lab Results  Component Value Date   NA 136 12/14/2022   K 4.0 12/14/2022   CO2 20 (L) 12/14/2022   GLUCOSE 134 (H) 12/14/2022   BUN 24 (H) 12/14/2022   CREATININE 2.29 (H)  12/14/2022   CALCIUM 8.4 (L) 12/14/2022   GFRNONAA 23 (L) 12/14/2022   GFRAA 39 (L) 04/14/2020   Lipid Panel:  Lab Results  Component Value Date   LDLCALC 81 12/13/2022   HgbA1c:  Lab Results  Component Value Date   HGBA1C 7.7 (H) 10/20/2022   Urine Drug Screen:     Component Value Date/Time   LABOPIA NONE DETECTED 04/28/2018 2330   COCAINSCRNUR NONE DETECTED 04/28/2018 2330   LABBENZ NONE DETECTED 04/28/2018 2330   AMPHETMU NONE DETECTED 04/28/2018 2330   THCU NONE DETECTED 04/28/2018 2330  LABBARB NONE DETECTED 04/28/2018 2330    Alcohol Level     Component Value Date/Time   [facility] <10 04/28/2018 2331     I have reviewed the images obtained:  CT Head without contrast 12/13/2022: Acute to subacute left temporal occipital infarction  MRI Brain without contrast 12/14/2022:  MRA head without contrast 12/14/2022:  MRA neck with and without contrast 12/14/2022:  Carotid ultrasound bilateral 12/14/2022: pending  TTE 12/14/2022: Pending  ASSESSMENT/PLAN: 66 year old female with multiple medical comorbidities as noted in HPI presented with transient right hemianopsia.   Acute ischemic stroke Etiology: Likely embolic  Recommendations: -Aspirin 81 mg daily for secondary stroke prevention - MRA head pending. If symptomatic intracranial stenosis, also recommend adding plavix 75mg  daily for 3 months -Atorvastatin 80 mg daily for goal LDL <70 - TTE pending. If no thrombus, recommend 30 day cardiac monitor to look for A fib -Goal blood pressure: Permissive hypertension upto 220/120 mmHg for up to 48 hours followed by gradual normotension.  Okay to use as needed labetalol or hydralazine if needed -PT/OT -Follow-up with neurology in 3 months (order placed) - Discussed with patient and husband at bedside, Dr Flossie Dibble via secure chat  Sleep apnea - Husband and patient concerned about potentially having sleep apnea. Will be referred for sleep study at [facility]  Thank you for allowing  [place] to participate in the care of this patient. If you have any further questions, please contact  me or neurohospitalist.   Lindie Spruce Epilepsy [facility] neuro[facility]ist

## 2022-12-14 NOTE — Progress Notes (Signed)
PROGRESS NOTE    Patient: Brooke [name]                            PCP: [name], FNP                    DOB: 1956-02-14            DOA: 12/13/2022 ZOX:[id number]             DOS: 12/14/2022, 2:40 PM   LOS: 0 days   Date of Service: The patient was seen and examined on 12/14/2022  Subjective:   The patient was seen and examined this morning. Hemodynamically stable. No issues overnight . Reporting open proved facial numbness, resolved visual deficit, reports of no asymmetric weaknesses, no focal neurological findings  Brief Narrative:   Brooke [name] is a 67 y.o. female with medical history significant of type 2 diabetes mellitus, chronic kidney disease stage IV, history of renal cell carcinoma, obesity, hypertension, hyperlipidemia who presents to the emergency department who presents with transitory loss of vision for about 30 minutes in the right eye.  She has since returned to baseline, but she was concerned that the symptoms may be related to stroke, so she presented to the ED for further evaluation and management.   ED Course:  BP was 167/77, other vital signs were within normal range.  Workup in the ED showed normal CBC except for platelets of 424, BMP was normal except for BUN of 24 and creatinine of 2.30.  Triglycerides 223, HDL 31, VLDL 45, sed rate 91 CT head without contrast showed acute to subacute left temporal occipital lobe infarction.  No intracranial hemorrhage. Chest x-ray showed no active cardiopulmonary disease Teleneurology was consulted and recommended further stroke workup.  Plavix 300 mg followed by 75 mg daily and aspirin 300 to 325 mg load followed by 2 mg daily if no contraindications were recommended.   Hospitalist was asked to admit patient for further evaluation and management.  Assessment/Plan Present on Admission:  Acute ischemic stroke [facility])  Essential hypertension  Mixed hyperlipidemia  Obesity (BMI 30-39.9)      Acute  ischemic stroke  -Possible TIA, improved symptoms, reportedly no facial asymmetry numbness denies of asymmetric weaknesses, Improved right visual deficit  Bilateral carotid ultrasound >>>  Echocardiogram >>>  MRI of brain without contrast>>>   Continue aspirin, Plavix and statin Continue fall precautions and neuro checks Lipid panel, LDL 81, triglyceride 223 Hemoglobin A1c: 8.2  Continue PT/SLP/OT eval and treat Bedside swallow eval by nursing -past, tolerating p.o.  Teleneurology consult was appreciated Neurology will be consulted admission await further recommendations   Chronic kidney disease stage IV Creatinine 2.30, this is within baseline range Renally adjust medications, avoid nephrotoxic agents/dehydration/hypotension Lab Results  Component Value Date   CREATININE 2.29 (H) 12/14/2022   CREATININE 2.30 (H) 12/13/2022   CREATININE 2.44 (H) 10/20/2022      Thrombocytosis possibly reactive Platelets 424, continue to monitor platelet levels   Essential hypertension  Antihypertensives PRN if Blood pressure is greater than 220/120 or there is a concern for End organ damage/contraindications for permissive HTN. If blood pressure is greater than 220/120 give labetalol PO or IV or Vasotec IV with a goal of 15% reduction in BP during the first 24 hours.   Mixed hyperlipidemia Continue Lipitor   Type 2 diabetes mellitus with hyperglycemia Hemoglobin A1c on 10/20/2022 was 7.7>>8.2 now  Continue ISS and hypoglycemia protocol  Shortness of breath/wheezing Patient complained of chronic history of wheezing without an official diagnosis of asthma or COPD Continue albuterol, Dulera per home regimen   Obesity (BMI 30.03) Diet and lifestyle modification    Nutritional status:  The patient's BMI is: Body mass index is 38.03 kg/m. I agree with the assessment and plan as outlined    -----------------------------------------------------------------------------------------------------------------------------------------  DVT prophylaxis:  SCDs Start: 12/14/22 0618   Code Status:   Code Status: Full Code  Family Communication: Family members present at bedside updated Status is: Observation The patient remains OBS appropriate and will d/c before 2 midnights.   Disposition: From  - home             Planning for discharge in 1-2 days: to   Procedures:   No admission procedures for hospital encounter.   Antimicrobials:  Anti-infectives (From admission, onward)    None        Medication:   atorvastatin  80 mg Oral Daily   insulin aspart  0-9 Units Subcutaneous TID WC   mometasone-formoterol  2 puff Inhalation BID    acetaminophen **OR** acetaminophen, albuterol, ondansetron **OR** ondansetron (ZOFRAN) IV   Objective:   Vitals:   12/14/22 0526 12/14/22 0626 12/14/22 0750 12/14/22 1100  BP: (!) 144/77 (!) 156/73  (!) 155/77  Pulse: 72 74  67  Resp: 18 18    Temp: 97.8 F (36.6 C) 98.8 F (37.1 C)  97.9 F (36.6 C)  TempSrc:    Oral  SpO2: 96% 99% 98%   Weight:      Height:       No intake or output data in the 24 hours ending 12/14/22 1440 Filed Weights   12/13/22 1912 12/14/22 0226  Weight: 99.8 kg 100.5 kg     Physical examination:   Constitution:  Alert, cooperative, no distress,  Appears calm and comfortable  Psychiatric:   Normal and stable mood and affect, cognition intact,   HEENT:        Normocephalic, PERRL, otherwise with in Normal limits  Chest:         Chest symmetric Cardio vascular:  S1/S2, RRR, No murmure, No Rubs or Gallops  pulmonary: Clear to auscultation bilaterally, respirations unlabored, negative wheezes / crackles Abdomen: Soft, non-tender, non-distended, bowel sounds,no masses, no organomegaly Muscular skeletal: Limited exam - in bed, able to move all 4 extremities,   Neuro: CNII-XII intact. , normal  motor and sensation, reflexes intact  Extremities: No pitting edema lower extremities, +2 pulses  Skin: Dry, warm to touch, negative for any Rashes, No open wounds Wounds: per nursing documentation   ------------------------------------------------------------------------------------------------------------------------------------------    LABs:     Latest Ref Rng & Units 12/14/2022    7:12 AM 12/13/2022   10:27 PM 11/10/2022    2:15 PM  CBC  WBC 4.0 - 10.5 K/uL 9.4  9.4  9.9   Hemoglobin 12.0 - 15.0 g/dL 32.4  40.1  02.7   Hematocrit 36.0 - 46.0 % 33.7  37.8  35.7   Platelets 150 - 400 K/uL 375  424  415       Latest Ref Rng & Units 12/14/2022    7:12 AM 12/13/2022   10:27 PM 10/20/2022    9:19 AM  CMP  Glucose 70 - 99 mg/dL 253  664  403   BUN 8 - 23 mg/dL 24  24  32   Creatinine 0.44 - 1.00 mg/dL 4.74  2.59  5.63   Sodium 135 - 145 mmol/L  136  137  141   Potassium 3.5 - 5.1 mmol/L 4.0  4.5  4.7   Chloride 98 - 111 mmol/L 105  107  107   CO2 22 - 32 mmol/L 20  23  21    Calcium 8.9 - 10.3 mg/dL 8.4  8.6  9.1   Total Protein 6.5 - 8.1 g/dL 6.7  7.6  6.5   Total Bilirubin 0.3 - 1.2 mg/dL 0.5  0.4  0.3   Alkaline Phos 38 - 126 U/L 88  99  114   AST 15 - 41 U/L 13  16  18    ALT 0 - 44 U/L 16  16  17         Micro Results No results found for this or any previous visit (from the past 240 hour(s)).  Radiology Reports CT Head Wo Contrast  Addendum Date: 12/14/2022   ADDENDUM REPORT: 12/14/2022 00:01 ADDENDUM: These results were called by telephone at the time of interpretation on 12/13/2022 at 11:54pm to provider Dr. Pilar Plate, who verbally acknowledged these results. Electronically Signed   By: Tish Frederickson M.D.   On: 12/14/2022 00:01   Result Date: 12/14/2022 CLINICAL DATA:  Neuro deficit, acute, stroke suspected Side Vision in right eye was slightly lost for about 30 minutes per pt. Currently vision is fine stated by the pt. Denies headaches, nausea, vomiting, and pain. EXAM:  CT HEAD WITHOUT CONTRAST TECHNIQUE: Contiguous axial images were obtained from the base of the skull through the vertex without intravenous contrast. RADIATION DOSE REDUCTION: This exam was performed according to the departmental dose-optimization program which includes automated exposure control, adjustment of the mA and/or kV according to patient size and/or use of iterative reconstruction technique. COMPARISON:  CT head 04/28/2018 FINDINGS: Brain: Loss of gray-white matter differentiation within the left temporal occipital lobe. Mild associated edema. No parenchymal hemorrhage. No mass lesion. No extra-axial collection. No mass effect or midline shift. No hydrocephalus. Basilar cisterns are patent. Vascular: No hyperdense vessel. Atherosclerotic calcifications are present within the cavernous internal carotid arteries. Skull: No acute fracture or focal lesion. Sinuses/Orbits: Bilateral mild ethmoid and maxillary sinus mucosal thickening. Paranasal sinuses and mastoid air cells are clear. The orbits are unremarkable. Other: None. IMPRESSION: 1. Acute to subacute left temporal occipital lobe infarction. 2. No intracranial hemorrhage. Electronically Signed: By: Tish Frederickson M.D. On: 12/13/2022 23:52   DG Chest 2 View  Result Date: 12/13/2022 CLINICAL DATA:  Chest congestion EXAM: CHEST - 2 VIEW COMPARISON:  06/13/2022 FINDINGS: The heart size and mediastinal contours are within normal limits. Both lungs are clear. The visualized skeletal structures are unremarkable. IMPRESSION: No active cardiopulmonary disease. Electronically Signed   By: Helyn Numbers M.D.   On: 12/13/2022 19:42    SIGNED: Kendell Bane, MD, FHM. FAAFP. [facility] - [facility] Time spent - 55 min.  In seeing, evaluating and examining the patient. Reviewing medical records, labs, drawn plan of care. [facility],  Pager (please use [email or web address] to page/ text) Please use Epic Secure Chat for non-urgent communication  (7AM-7PM)  If 7PM-7AM, please contact night-coverage [email or web address], 12/14/2022, 2:40 PM

## 2022-12-14 NOTE — H&P (Addendum)
History and Physical    Patient: Brooke [name] LKG:[id number] DOB: Jun 28, 1955 DOA: 12/13/2022 DOS: the patient was seen and examined on 12/14/2022 PCP: [name], FNP  Patient coming from: Home  Chief Complaint:  Chief Complaint  Patient presents with   Eye Problem   Nasal Congestion   HPI: Brooke [name] is a 67 y.o. female with medical history significant of type 2 diabetes mellitus, chronic kidney disease stage IV, history of renal cell carcinoma, obesity, hypertension, hyperlipidemia who presents to the emergency department who presents with transitory loss of vision for about 30 minutes in the right eye.  She has since returned to baseline, but she was concerned that the symptoms may be related to stroke, so she presented to the ED for further evaluation and management.  She denies chest pain, shortness of breath, weakness, numbness or tingling of any extremity.  ED Course:  In the emergency department, BP was 167/77, other vital signs were within normal range.  Workup in the ED showed normal CBC except for platelets of 424, BMP was normal except for BUN of 24 and creatinine of 2.30.  Triglycerides 223, HDL 31, VLDL 45, sed rate 91 CT head without contrast showed acute to subacute left temporal occipital lobe infarction.  No intracranial hemorrhage. Chest x-ray showed no active cardiopulmonary disease Teleneurology was consulted and recommended further stroke workup.  Plavix 300 mg followed by 75 mg daily and aspirin 300 to 325 mg load followed by 2 mg daily if no contraindications were recommended.   Hospitalist was asked to admit patient for further evaluation and management.  Review of Systems: Review of systems as noted in the HPI. All other systems reviewed and are negative.   Past Medical History:  Diagnosis Date   Anemia    Arthritis    Bronchitis    frequent bronchitis   Diabetes mellitus    Diabetic nephropathy (HCC)    Hepatic steatosis    History  of renal cell carcinoma 08/04/2021   Clear cell, nuclear grade 4, size 5.3 cm, S/P nephrectomy   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   Hypertension    Hyperuricemia 11/27/2020   Obesity (BMI 35.0-39.9 without comorbidity)    Renal mass 08/04/2021   Vertigo    Vitamin D insufficiency 11/27/2020   Past Surgical History:  Procedure Laterality Date   BREAST SURGERY Right 1974   OPERATIVE ULTRASOUND N/A 08/04/2021   Procedure: OPERATIVE ULTRASOUND;  Surgeon: Sebastian Ache, MD;  Location: [facility];  Service: Urology;  Laterality: N/A;   ROBOTIC ASSITED PARTIAL NEPHRECTOMY Right 08/04/2021   Procedure: XI ROBOTIC ASSITED RADICAL NEPHRECTOMY;  Surgeon: Sebastian Ache, MD;  Location: [facility];  Service: Urology;  Laterality: Right;    Social History:  reports that she has never smoked. She has never used smokeless tobacco. She reports that she does not drink alcohol and does not use drugs.   Allergies  Allergen Reactions   Jardiance [Empagliflozin]     Holding per nephro   Chauncey Mann [Finerenone]     Holding per nephro   Metformin And Related     CONTRAINDICATED DUE TO GFR; GI SIDE EFFECTS    Family History  Problem Relation Age of Onset   Diabetes Mother    Congestive Heart Failure Mother    Kidney disease Father    Diabetes Sister    Diabetes Brother    Hypertension Daughter    Hypertension Sister    Cervical cancer Sister      Prior to Admission  medications   Medication Sig Start Date End Date Taking? Authorizing Provider  acetaminophen (TYLENOL) 500 MG tablet Take 1,000 mg by mouth every 8 (eight) hours as needed for moderate pain.    [provider]  albuterol (VENTOLIN HFA) 108 (90 Base) MCG/ACT inhaler Inhale 2 puffs into the lungs every 6 (six) hours as needed for wheezing or shortness of breath. 06/26/19   [name], FNP  amLODipine (NORVASC) 10 MG tablet Take 1 tablet (10 mg total) by mouth daily. 07/21/22   [name], [name], FNP  atenolol  (TENORMIN) 100 MG tablet Take 1 tablet (100 mg total) by mouth every morning. 07/21/22   [name], [name], FNP  atorvastatin (LIPITOR) 40 MG tablet Take 1 tablet (40 mg total) by mouth daily. 10/25/22   [name], [name], FNP  budesonide-formoterol (SYMBICORT) 80-4.5 MCG/ACT inhaler Inhale 2 puffs into the lungs 2 (two) times daily. 11/10/22   [name], [name], FNP  Camphor-Menthol-Methyl Sal (SALONPAS) 3.03-12-08 % PTCH Apply 1 patch topically daily as needed (pain).    [provider]  Cholecalciferol 25 MCG (1000 UT) capsule Take 1,000 Units by mouth daily. 01/07/21   [provider]  Continuous Glucose Receiver (FREESTYLE LIBRE 3 READER) DEVI 1 Device by Does not apply route as directed. with compatible Freestyle Libre 3 sensor to monitor glucose continuously. DX:E11.65 09/01/22   [name], [name], FNP  Continuous Glucose Sensor (FREESTYLE LIBRE 3 SENSOR) MISC Use 1 device subcutaneously as directed to monitor glucose continuously every 14 days. DX:E11.65 09/01/22   [name], [name], FNP  docusate sodium (COLACE) 100 MG capsule Take 1 capsule (100 mg total) by mouth 2 (two) times daily. 08/04/21   Harrie Foreman, PA-C  Dulaglutide (TRULICITY) 4.5 MG/0.5ML SOPN Inject 4.5 mg as directed once a week. 05/26/22   [name], FNP  hydrochlorothiazide (HYDRODIURIL) 25 MG tablet Take 1 tablet (25 mg total) by mouth daily. 07/21/22   [name], [name], FNP  Insulin Glargine (BASAGLAR KWIKPEN) 100 UNIT/ML Inject 37 Units into the skin daily. 09/01/22   [name], [name], FNP  insulin lispro (HUMALOG KWIKPEN) 100 UNIT/ML KwikPen Inject 10-15 Units into the skin See admin instructions. 10 units WITH BREAKFAST AND LUNCH, TAKE 15 units at [facility] 09/01/22   [name], [name], FNP  Insulin Pen Needle 31G X 6 MM MISC Use to inject insulin 4 times daily as directed. DX: E11.65 05/26/22   [name], FNP  lisinopril (ZESTRIL) 10 MG tablet Take by mouth.  03/22/22   [provider]  Menthol, Topical Analgesic, (BIOFREEZE) 4 % GEL Apply 1 application. topically daily as needed (pain).    [provider]  promethazine-dextromethorphan (PROMETHAZINE-DM) 6.25-15 MG/5ML syrup Take 5 mLs by mouth 4 (four) times daily as needed. 06/13/22   [provider]    Physical Exam: BP (!) 156/73   Pulse 74   Temp 98.8 F (37.1 C)   Resp 18   Ht 5\' 4"  (1.626 m)   Wt 100.5 kg   LMP 03/16/2012   SpO2 99%   BMI 38.03 kg/m   General: 67 y.o. year-old female well developed well nourished in no acute distress.  Alert and oriented x3. HEENT: NCAT, EOMI Neck: Supple, trachea medial Cardiovascular: Regular rate and rhythm with no rubs or gallops.  No thyromegaly or JVD noted.  No lower extremity edema. 2/4 pulses in all 4 extremities. Respiratory: Diffuse mild wheezing on auscultation with no rales.  Abdomen: Soft, nontender nondistended with normal bowel  sounds x4 quadrants. Muskuloskeletal: No cyanosis, clubbing or edema noted bilaterally Neuro: CN II-XII intact, strength 5/5 x 4, sensation, reflexes intact Skin: No ulcerative lesions noted or rashes Psychiatry: Judgement and insight appear normal. Mood is appropriate for condition and setting          Labs on Admission:  Basic Metabolic Panel: Recent Labs  Lab 12/13/22 2227  NA 137  K 4.5  CL 107  CO2 23  GLUCOSE 136*  BUN 24*  CREATININE 2.30*  CALCIUM 8.6*   Liver Function Tests: Recent Labs  Lab 12/13/22 2227  AST 16  ALT 16  ALKPHOS 99  BILITOT 0.4  PROT 7.6  ALBUMIN 3.6   No results for input(s): "LIPASE", "AMYLASE" in the last 168 hours. No results for input(s): "AMMONIA" in the last 168 hours. CBC: Recent Labs  Lab 12/13/22 2227  WBC 9.4  NEUTROABS 5.3  HGB 11.6*  HCT 37.8  MCV 85.7  PLT 424*   Cardiac Enzymes: No results for input(s): "CKTOTAL", "CKMB", "CKMBINDEX", "TROPONINI" in the last 168 hours.  BNP (last 3 results) No results for  input(s): "BNP" in the last 8760 hours.  ProBNP (last 3 results) No results for input(s): "PROBNP" in the last 8760 hours.  CBG: Recent Labs  Lab 12/13/22 2332  GLUCAP 127*    Radiological Exams on Admission: CT Head Wo Contrast  Addendum Date: 12/14/2022   ADDENDUM REPORT: 12/14/2022 00:01 ADDENDUM: These results were called by telephone at the time of interpretation on 12/13/2022 at 11:54pm to provider Dr. Pilar Plate, who verbally acknowledged these results. Electronically Signed   By: Tish Frederickson M.D.   On: 12/14/2022 00:01   Result Date: 12/14/2022 CLINICAL DATA:  Neuro deficit, acute, stroke suspected Side Vision in right eye was slightly lost for about 30 minutes per pt. Currently vision is fine stated by the pt. Denies headaches, nausea, vomiting, and pain. EXAM: CT HEAD WITHOUT CONTRAST TECHNIQUE: Contiguous axial images were obtained from the base of the skull through the vertex without intravenous contrast. RADIATION DOSE REDUCTION: This exam was performed according to the departmental dose-optimization program which includes automated exposure control, adjustment of the mA and/or kV according to patient size and/or use of iterative reconstruction technique. COMPARISON:  CT head 04/28/2018 FINDINGS: Brain: Loss of gray-white matter differentiation within the left temporal occipital lobe. Mild associated edema. No parenchymal hemorrhage. No mass lesion. No extra-axial collection. No mass effect or midline shift. No hydrocephalus. Basilar cisterns are patent. Vascular: No hyperdense vessel. Atherosclerotic calcifications are present within the cavernous internal carotid arteries. Skull: No acute fracture or focal lesion. Sinuses/Orbits: Bilateral mild ethmoid and maxillary sinus mucosal thickening. Paranasal sinuses and mastoid air cells are clear. The orbits are unremarkable. Other: None. IMPRESSION: 1. Acute to subacute left temporal occipital lobe infarction. 2. No intracranial hemorrhage.  Electronically Signed: By: Tish Frederickson M.D. On: 12/13/2022 23:52   DG Chest 2 View  Result Date: 12/13/2022 CLINICAL DATA:  Chest congestion EXAM: CHEST - 2 VIEW COMPARISON:  06/13/2022 FINDINGS: The heart size and mediastinal contours are within normal limits. Both lungs are clear. The visualized skeletal structures are unremarkable. IMPRESSION: No active cardiopulmonary disease. Electronically Signed   By: Helyn Numbers M.D.   On: 12/13/2022 19:42    EKG: I independently viewed the EKG done and my findings are as followed: EKG was not done in the ED  Assessment/Plan Present on Admission:  Acute ischemic stroke [facility])  Essential hypertension  Mixed hyperlipidemia  Obesity (BMI 30-39.9)  Principal Problem:   Acute ischemic stroke [facility]) Active Problems:   Type 2 diabetes mellitus with hyperglycemia, with long-term current use of insulin (HCC)   Mixed hyperlipidemia   Essential hypertension   Obesity (BMI 30-39.9)   CKD (chronic kidney disease) stage 4, GFR 15-29 ml/min (HCC)   Thrombocytosis   Shortness of breath  Acute ischemic stroke  Patient will be admitted to telemetry unit  Bilateral carotid ultrasound in the morning Echocardiogram in the morning MRI of brain without contrast in the morning Continue aspirin, Plavix and statin Continue fall precautions and neuro checks Lipid panel and hemoglobin A1c will be checked Continue PT/SLP/OT eval and treat Bedside swallow eval by nursing prior to diet Teleneurology consult was appreciated Neurology will be consulted admission await further recommendations  Chronic kidney disease stage IV Creatinine 2.30, this is within baseline range Renally adjust medications, avoid nephrotoxic agents/dehydration/hypotension  Thrombocytosis possibly reactive Platelets 424, continue to monitor platelet levels  Essential hypertension  Antihypertensives PRN if Blood pressure is greater than 220/120 or there is a concern for End organ  damage/contraindications for permissive HTN. If blood pressure is greater than 220/120 give labetalol PO or IV or Vasotec IV with a goal of 15% reduction in BP during the first 24 hours.  Mixed hyperlipidemia Continue Lipitor  Type 2 diabetes mellitus with hyperglycemia Hemoglobin A1c on 10/20/2022 was 7.7 Continue ISS and hypoglycemia protocol  Shortness of breath/wheezing Patient complained of chronic history of wheezing without an official diagnosis of asthma or COPD Continue albuterol, Dulera per home regimen  Obesity (BMI 30.03) Diet and lifestyle modification  DVT prophylaxis: SCDs   Advance Care Planning: CODE STATUS: Full code  Consults: Neurology  Family Communication: None at bedside  Severity of Illness: The appropriate patient status for this patient is OBSERVATION. Observation status is judged to be reasonable and necessary in order to provide the required intensity of service to ensure the patient's safety. The patient's presenting symptoms, physical exam findings, and initial radiographic and laboratory data in the context of their medical condition is felt to place them at decreased risk for further clinical deterioration. Furthermore, it is anticipated that the patient will be medically stable for discharge from the hospital within 2 midnights of admission.   Author: Frankey Shown, DO 12/14/2022 6:44 AM  For on call review [email or web address].

## 2022-12-14 NOTE — Plan of Care (Signed)
  Problem: Education: Goal: Knowledge of General Education information will improve Description: Including pain rating scale, medication(s)/side effects and non-pharmacologic comfort measures Outcome: Progressing  Problem: Activity: Goal: Risk for activity intolerance will decrease Outcome: Progressing   Problem: Elimination: Goal: Will not experience complications related to urinary retention Outcome: Progressing   Problem: Safety: Goal: Ability to remain free from injury will improve Outcome: Progressing   Problem: Health Behavior/Discharge Planning: Goal: Ability to manage health-related needs will improve Outcome: Progressing

## 2022-12-14 NOTE — Evaluation (Signed)
Physical Therapy Evaluation Patient Details Name: Brooke [name] MRN: [id number] DOB: [birth year] Today's Date: 12/14/2022  History of Present Illness  Brooke [name] is a 67 y.o. female with medical history significant of type 2 diabetes mellitus, chronic kidney disease stage IV, history of renal cell carcinoma, obesity, hypertension, hyperlipidemia who presents to the emergency department who presents with transitory loss of vision for about 30 minutes in the right eye.  She has since returned to baseline, but she was concerned that the symptoms may be related to stroke, so she presented to the ED for further evaluation and management.  She denies chest pain, shortness of breath, weakness, numbness or tingling of any extremity.   Clinical Impression  Patient functioning at baseline for functional mobility and gait demonstrating good return for ambulating in room/hallways without loss of balance or need for an AD.  Plan:  Patient discharged from physical therapy to care of nursing for ambulation daily as tolerated for length of stay.          If plan is discharge home, recommend the following:     Can travel by private vehicle        Equipment Recommendations    Recommendations for Other Services       Functional Status Assessment Patient has not had a recent decline in their functional status     Precautions / Restrictions Precautions Precautions: None Restrictions Weight Bearing Restrictions: No      Mobility  Bed Mobility Overal bed mobility: Independent                  Transfers Overall transfer level: Independent                      Ambulation/Gait Ambulation/Gait assistance: Modified independent (Device/Increase time), Independent Gait Distance (Feet): 150 Feet Assistive device: None Gait Pattern/deviations: WFL(Within Functional Limits) Gait velocity: normal     General Gait Details: good return for ambulating in room, hallways without  loss of balance or need for an AD  Stairs            Wheelchair Mobility     Tilt Bed    Modified Rankin (Stroke Patients Only)       Balance Overall balance assessment: Independent                                           Pertinent Vitals/Pain Pain Assessment Pain Assessment: No/denies pain    Home Living Family/patient expects to be discharged to:: Private residence Living Arrangements: Spouse/significant other Available Help at Discharge: Family;Available PRN/intermittently Type of Home: Mobile home Home Access: Stairs to enter Entrance Stairs-Rails: Can reach both Entrance Stairs-Number of Steps: 4   Home Layout: One level Home Equipment: Shower seat      Prior Function Prior Level of Function : Independent/Modified Independent             Mobility Comments: Tourist information centre manager without AD ADLs Comments: Independent     Extremity/Trunk Assessment   Upper Extremity Assessment Upper Extremity Assessment: Defer to OT evaluation    Lower Extremity Assessment Lower Extremity Assessment: Overall WFL for tasks assessed    Cervical / Trunk Assessment Cervical / Trunk Assessment: Normal  Communication   Communication Communication: No apparent difficulties  Cognition Arousal: Alert Behavior During Therapy: WFL for tasks assessed/performed Overall Cognitive Status: Within Functional Limits for tasks assessed  General Comments      Exercises     Assessment/Plan    PT Assessment Patient does not need any further PT services  PT Problem List         PT Treatment Interventions      PT Goals (Current goals can be found in the Care Plan section)  Acute Rehab PT Goals Patient Stated Goal: return home PT Goal Formulation: With patient Time For Goal Achievement: 12/14/22 Potential to Achieve Goals: Good    Frequency       Co-evaluation PT/OT/SLP  Co-Evaluation/Treatment: Yes Reason for Co-Treatment: To address functional/ADL transfers PT goals addressed during session: Mobility/safety with mobility;Balance OT goals addressed during session: ADL's and self-care       AM-PAC PT "6 Clicks" Mobility  Outcome Measure Help needed turning from your back to your side while in a flat bed without using bedrails?: None Help needed moving from lying on your back to sitting on the side of a flat bed without using bedrails?: None Help needed moving to and from a bed to a chair (including a wheelchair)?: None Help needed standing up from a chair using your arms (e.g., wheelchair or bedside chair)?: None Help needed to walk in hospital room?: None Help needed climbing 3-5 steps with a railing? : None 6 Click Score: 24    End of Session   Activity Tolerance: Patient tolerated treatment well Patient left: in bed Nurse Communication: Mobility status PT Visit Diagnosis: Unsteadiness on feet (R26.81);Other abnormalities of gait and mobility (R26.89);Muscle weakness (generalized) (M62.81)    Time: 1308-6578 PT Time Calculation (min) (ACUTE ONLY): 20 min   Charges:   PT Evaluation $PT Eval Moderate Complexity: 1 Mod PT Treatments $Therapeutic Activity: 8-22 mins PT General Charges $$ ACUTE PT VISIT: 1 Visit         12:14 PM, 12/14/22 Ocie Bob, MPT Physical Therapist with [facility] [phone] office [phone] mobile phone

## 2022-12-14 NOTE — Progress Notes (Signed)
Patient has had no new deficits over night and no complaints of vision loss since admission to floor.

## 2022-12-14 NOTE — Care Management Obs Status (Signed)
MEDICARE OBSERVATION STATUS NOTIFICATION   Patient Details  Name: Brooke [name] MRN: [id number] Date of Birth: 05/24/1955   Medicare Observation Status Notification Given:  Yes    Corey Harold 12/14/2022, 4:30 PM

## 2022-12-14 NOTE — Progress Notes (Signed)
SLP Cancellation Note  Patient Details Name: [name] MRN: [id number] DOB: 12/09/1955   Cancelled treatment:       Reason Eval/Treat Not Completed: Other (comment) (Pt passed the [facility] swallow screen. If MD or staff would still like BSE, please clarify. Also, If SLE needed, please order.)  Thank you,  Havery Moros, CCC-SLP [phone]  Canisha Issac 12/14/2022, 8:15 AM

## 2022-12-15 ENCOUNTER — Ambulatory Visit: Payer: Medicare HMO | Admitting: Nurse Practitioner

## 2022-12-15 ENCOUNTER — Telehealth: Payer: Self-pay

## 2022-12-15 DIAGNOSIS — E782 Mixed hyperlipidemia: Secondary | ICD-10-CM

## 2022-12-15 DIAGNOSIS — Z7985 Long-term (current) use of injectable non-insulin antidiabetic drugs: Secondary | ICD-10-CM

## 2022-12-15 DIAGNOSIS — I639 Cerebral infarction, unspecified: Secondary | ICD-10-CM

## 2022-12-15 DIAGNOSIS — E559 Vitamin D deficiency, unspecified: Secondary | ICD-10-CM

## 2022-12-15 DIAGNOSIS — I10 Essential (primary) hypertension: Secondary | ICD-10-CM

## 2022-12-15 DIAGNOSIS — E1122 Type 2 diabetes mellitus with diabetic chronic kidney disease: Secondary | ICD-10-CM

## 2022-12-15 DIAGNOSIS — Z794 Long term (current) use of insulin: Secondary | ICD-10-CM

## 2022-12-15 LAB — GLUCOSE, CAPILLARY
Glucose-Capillary: 143 mg/dL — ABNORMAL HIGH (ref 70–99)
Glucose-Capillary: 145 mg/dL — ABNORMAL HIGH (ref 70–99)

## 2022-12-15 MED ORDER — ASPIRIN 81 MG PO TBEC: 81.0000 mg | DELAYED_RELEASE_TABLET | Freq: Every day | ORAL | 12 refills | Status: DC

## 2022-12-15 MED ORDER — ATORVASTATIN CALCIUM 80 MG PO TABS: 80.0000 mg | ORAL_TABLET | Freq: Every day | ORAL | 1 refills | Status: DC

## 2022-12-15 MED ORDER — CLOPIDOGREL BISULFATE 75 MG PO TABS: 75.0000 mg | ORAL_TABLET | Freq: Every day | ORAL | 0 refills | Status: AC

## 2022-12-15 NOTE — Telephone Encounter (Signed)
Pt needs 30 day event monitor per Dr. Flossie Dibble (hospitalist) for CVA  Order placed

## 2022-12-15 NOTE — Discharge Summary (Signed)
Physician Discharge Summary   Patient: Brooke [name] MRN: [id number] DOB: [birth year]  Admit date:     12/13/2022  Discharge date: 12/15/22  Discharge Physician: Kendell Bane   PCP: [name], FNP   Recommendations at discharge:   Per current recommendation, to continue aspirin 81 mg and Plavix 75 mg for 21 days then continue aspirin Continue high-dose statin, Lipitor at 80 mg daily Follow-up with neurologist in 6-8 weeks Follow-up with PCP in 2-4 weeks Follow-up with 30-day Holter monitor per neurology recommendation  Discharge Diagnoses: Principal Problem:   Acute ischemic stroke [facility]) Active Problems:   Type 2 diabetes mellitus with hyperglycemia, with long-term current use of insulin (HCC)   Mixed hyperlipidemia   Essential hypertension   Obesity (BMI 30-39.9)   CKD (chronic kidney disease) stage 4, GFR 15-29 ml/min (HCC)   Thrombocytosis   Shortness of breath  Resolved Problems:   * No resolved hospital problems. *  Hospital Course: Brooke [name] is a 67 y.o. female with medical history significant of type 2 diabetes mellitus, chronic kidney disease stage IV, history of renal cell carcinoma, obesity, hypertension, hyperlipidemia who presents to the emergency department who presents with transitory loss of vision for about 30 minutes in the right eye.  She has since returned to baseline, but she was concerned that the symptoms may be related to stroke, so she presented to the ED for further evaluation and management.   ED Course:  BP was 167/77, other vital signs were within normal range.  Workup in the ED showed normal CBC except for platelets of 424, BMP was normal except for BUN of 24 and creatinine of 2.30.  Triglycerides 223, HDL 31, VLDL 45, sed rate 91 CT head without contrast showed acute to subacute left temporal occipital lobe infarction.  No intracranial hemorrhage. Chest x-ray showed no active cardiopulmonary disease Teleneurology was  consulted and recommended further stroke workup.  Plavix 300 mg followed by 75 mg daily and aspirin 300 to 325 mg load followed by 2 mg daily if no contraindications were recommended.   Hospitalist was asked to admit patient for further evaluation and management.     Acute ischemic stroke  -Acute/subacute 3.2 left PCA distribution infarct Improved right visual deficit  Bilateral carotid ultrasound >>> Minimal amount of bilateral atherosclerotic plaque, left-greater-than-right,  Echocardiogram >>>   MRI/ MRA of brain/neck>>>  3.2 cm acute to early subacute left PCA distribution infarct. Associated petechial hemorrhage without frank hemorrhagic transformation or significant mass effect. 2. Underlying mild chronic microvascular ischemic disease with small remote right frontal and left cerebellar infarcts.   MRA HEAD IMPRESSION:   Continue aspirin, Plavix and statin Continue fall precautions and neuro checks Lipid panel, LDL 81, triglyceride 223 Hemoglobin A1c: 8.2  Continue PT/SLP/OT eval and treat-follow-up recommendations Bedside swallow eval by nursing -past, tolerating p.o.  Teleneurology consulted Follow-up with neurology and 4-6 weeks    Chronic kidney disease stage IV Creatinine 2.30, this is within baseline range Renally adjust medications, avoid nephrotoxic agents/dehydration/hypotension Lab Results  Component Value Date   CREATININE 2.29 (H) 12/14/2022   CREATININE 2.30 (H) 12/13/2022   CREATININE 2.44 (H) 10/20/2022   HCTZ was discontinued, the rest of BP meds has been continued     Thrombocytosis possibly reactive Platelets 424, continue to monitor platelet levels   Essential hypertension -Massive hypertension was pursued, BP meds were held -HCTZ was discontinued rest of home BP meds to be resumed    Mixed hyperlipidemia Continue Lipitor-crease to high intensity  Physician Discharge Summary   Patient: Brooke [name] MRN: [id number] DOB: [birth year]  Admit date:     12/13/2022  Discharge date: 12/15/22  Discharge Physician: Kendell Bane   PCP: [name], FNP   Recommendations at discharge:   Per current recommendation, to continue aspirin 81 mg and Plavix 75 mg for 21 days then continue aspirin Continue high-dose statin, Lipitor at 80 mg daily Follow-up with neurologist in 6-8 weeks Follow-up with PCP in 2-4 weeks Follow-up with 30-day Holter monitor per neurology recommendation  Discharge Diagnoses: Principal Problem:   Acute ischemic stroke [facility]) Active Problems:   Type 2 diabetes mellitus with hyperglycemia, with long-term current use of insulin (HCC)   Mixed hyperlipidemia   Essential hypertension   Obesity (BMI 30-39.9)   CKD (chronic kidney disease) stage 4, GFR 15-29 ml/min (HCC)   Thrombocytosis   Shortness of breath  Resolved Problems:   * No resolved hospital problems. *  Hospital Course: Brooke [name] is a 67 y.o. female with medical history significant of type 2 diabetes mellitus, chronic kidney disease stage IV, history of renal cell carcinoma, obesity, hypertension, hyperlipidemia who presents to the emergency department who presents with transitory loss of vision for about 30 minutes in the right eye.  She has since returned to baseline, but she was concerned that the symptoms may be related to stroke, so she presented to the ED for further evaluation and management.   ED Course:  BP was 167/77, other vital signs were within normal range.  Workup in the ED showed normal CBC except for platelets of 424, BMP was normal except for BUN of 24 and creatinine of 2.30.  Triglycerides 223, HDL 31, VLDL 45, sed rate 91 CT head without contrast showed acute to subacute left temporal occipital lobe infarction.  No intracranial hemorrhage. Chest x-ray showed no active cardiopulmonary disease Teleneurology was  consulted and recommended further stroke workup.  Plavix 300 mg followed by 75 mg daily and aspirin 300 to 325 mg load followed by 2 mg daily if no contraindications were recommended.   Hospitalist was asked to admit patient for further evaluation and management.     Acute ischemic stroke  -Acute/subacute 3.2 left PCA distribution infarct Improved right visual deficit  Bilateral carotid ultrasound >>> Minimal amount of bilateral atherosclerotic plaque, left-greater-than-right,  Echocardiogram >>>   MRI/ MRA of brain/neck>>>  3.2 cm acute to early subacute left PCA distribution infarct. Associated petechial hemorrhage without frank hemorrhagic transformation or significant mass effect. 2. Underlying mild chronic microvascular ischemic disease with small remote right frontal and left cerebellar infarcts.   MRA HEAD IMPRESSION:   Continue aspirin, Plavix and statin Continue fall precautions and neuro checks Lipid panel, LDL 81, triglyceride 223 Hemoglobin A1c: 8.2  Continue PT/SLP/OT eval and treat-follow-up recommendations Bedside swallow eval by nursing -past, tolerating p.o.  Teleneurology consulted Follow-up with neurology and 4-6 weeks    Chronic kidney disease stage IV Creatinine 2.30, this is within baseline range Renally adjust medications, avoid nephrotoxic agents/dehydration/hypotension Lab Results  Component Value Date   CREATININE 2.29 (H) 12/14/2022   CREATININE 2.30 (H) 12/13/2022   CREATININE 2.44 (H) 10/20/2022   HCTZ was discontinued, the rest of BP meds has been continued     Thrombocytosis possibly reactive Platelets 424, continue to monitor platelet levels   Essential hypertension -Massive hypertension was pursued, BP meds were held -HCTZ was discontinued rest of home BP meds to be resumed    Mixed hyperlipidemia Continue Lipitor-crease to high intensity  smoking, obesity, and infiltrative/myelofibrotic marrow processes. No scalp soft tissue abnormality. Sinuses/Orbits: Globes and orbital soft tissues within normal limits. Scattered mucosal thickening present throughout the paranasal sinuses, worse on the right. Trace right mastoid effusion, of doubtful significance. Other: None. MRA HEAD FINDINGS Anterior circulation: Examination markedly degraded by motion artifact. Both internal carotid arteries are grossly patent through the siphons without visible stenosis or other abnormality. A1 segments patent bilaterally. Grossly normal anterior communicating artery complex. Anterior cerebral arteries patent without visible stenosis. No M1 stenosis or occlusion. Distal MCA branches perfused and symmetric. Posterior circulation: Visualized V4 segments patent without stenosis. Neither PICA well visualized on this motion degraded exam. Basilar patent without visible stenosis. Superior cerebellar arteries patent bilaterally. Both PCAs primarily supplied via the basilar. PCAs are patent to their distal aspects without visible stenosis. Anatomic variants: None significant.  No aneurysm. MRA NECK FINDINGS Aortic arch: Visualized aortic arch within normal limits for caliber standard 3 vessel morphology. No stenosis or other abnormality about the origin of the great vessels. Right carotid system: Right common and internal carotid arteries are patent with antegrade flow. No evidence for dissection. No hemodynamically significant stenosis about the right carotid artery system. Left carotid system: Left common and internal carotid arteries are patent with antegrade flow. No evidence for dissection. No hemodynamically significant stenosis about the left carotid artery system. Vertebral arteries: Both vertebral arteries arise from subclavian arteries. No  proximal subclavian artery stenosis. Vertebral arteries are largely codominant and patent without stenosis or dissection. Other: None IMPRESSION: MRI HEAD IMPRESSION: 1. 3.2 cm acute to early subacute left PCA distribution infarct. Associated petechial hemorrhage without frank hemorrhagic transformation or significant mass effect. 2. Underlying mild chronic microvascular ischemic disease with small remote right frontal and left cerebellar infarcts. MRA HEAD IMPRESSION: 1. Motion degraded exam. 2. Grossly negative intracranial MRA. No large vessel occlusion, hemodynamically significant stenosis, or other acute vascular abnormality. MRA NECK IMPRESSION: Wide patency of both carotid artery systems and vertebral arteries within the neck. No hemodynamically significant stenosis or other acute vascular abnormality. Electronically Signed   By: Rise Mu M.D.   On: 12/14/2022 17:58   ECHOCARDIOGRAM COMPLETE  Result Date: 12/14/2022    ECHOCARDIOGRAM REPORT   Patient Name:   Brooke [name] Date of Exam: 12/14/2022 Medical Rec #:  [id number]        Height:       64.0 in Accession #:    [id number]       Weight:       221.6 lb Date of Birth:  [birth year]       BSA:          2.043 m Patient Age:    66 years         BP:           155/77 mmHg Patient Gender: F                HR:           74 bpm. [facility] Procedure: 2D Echo, Color Doppler and Cardiac Doppler Indications:    Stroke I63.9  History:        Patient has no prior history of Echocardiogram examinations.                 Risk Factors:Diabetes, Hypertension and Dyslipidemia.  Sonographer:    Harriette Bouillon RDCS Referring Phys: [id number] OLADAPO ADEFESO IMPRESSIONS  1. Left ventricular ejection fraction, by estimation, is 60 to 65%. The left ventricle  smoking, obesity, and infiltrative/myelofibrotic marrow processes. No scalp soft tissue abnormality. Sinuses/Orbits: Globes and orbital soft tissues within normal limits. Scattered mucosal thickening present throughout the paranasal sinuses, worse on the right. Trace right mastoid effusion, of doubtful significance. Other: None. MRA HEAD FINDINGS Anterior circulation: Examination markedly degraded by motion artifact. Both internal carotid arteries are grossly patent through the siphons without visible stenosis or other abnormality. A1 segments patent bilaterally. Grossly normal anterior communicating artery complex. Anterior cerebral arteries patent without visible stenosis. No M1 stenosis or occlusion. Distal MCA branches perfused and symmetric. Posterior circulation: Visualized V4 segments patent without stenosis. Neither PICA well visualized on this motion degraded exam. Basilar patent without visible stenosis. Superior cerebellar arteries patent bilaterally. Both PCAs primarily supplied via the basilar. PCAs are patent to their distal aspects without visible stenosis. Anatomic variants: None significant.  No aneurysm. MRA NECK FINDINGS Aortic arch: Visualized aortic arch within normal limits for caliber standard 3 vessel morphology. No stenosis or other abnormality about the origin of the great vessels. Right carotid system: Right common and internal carotid arteries are patent with antegrade flow. No evidence for dissection. No hemodynamically significant stenosis about the right carotid artery system. Left carotid system: Left common and internal carotid arteries are patent with antegrade flow. No evidence for dissection. No hemodynamically significant stenosis about the left carotid artery system. Vertebral arteries: Both vertebral arteries arise from subclavian arteries. No  proximal subclavian artery stenosis. Vertebral arteries are largely codominant and patent without stenosis or dissection. Other: None IMPRESSION: MRI HEAD IMPRESSION: 1. 3.2 cm acute to early subacute left PCA distribution infarct. Associated petechial hemorrhage without frank hemorrhagic transformation or significant mass effect. 2. Underlying mild chronic microvascular ischemic disease with small remote right frontal and left cerebellar infarcts. MRA HEAD IMPRESSION: 1. Motion degraded exam. 2. Grossly negative intracranial MRA. No large vessel occlusion, hemodynamically significant stenosis, or other acute vascular abnormality. MRA NECK IMPRESSION: Wide patency of both carotid artery systems and vertebral arteries within the neck. No hemodynamically significant stenosis or other acute vascular abnormality. Electronically Signed   By: Rise Mu M.D.   On: 12/14/2022 17:58   ECHOCARDIOGRAM COMPLETE  Result Date: 12/14/2022    ECHOCARDIOGRAM REPORT   Patient Name:   Brooke [name] Date of Exam: 12/14/2022 Medical Rec #:  [id number]        Height:       64.0 in Accession #:    [id number]       Weight:       221.6 lb Date of Birth:  [birth year]       BSA:          2.043 m Patient Age:    66 years         BP:           155/77 mmHg Patient Gender: F                HR:           74 bpm. [facility] Procedure: 2D Echo, Color Doppler and Cardiac Doppler Indications:    Stroke I63.9  History:        Patient has no prior history of Echocardiogram examinations.                 Risk Factors:Diabetes, Hypertension and Dyslipidemia.  Sonographer:    Harriette Bouillon RDCS Referring Phys: [id number] OLADAPO ADEFESO IMPRESSIONS  1. Left ventricular ejection fraction, by estimation, is 60 to 65%. The left ventricle  smoking, obesity, and infiltrative/myelofibrotic marrow processes. No scalp soft tissue abnormality. Sinuses/Orbits: Globes and orbital soft tissues within normal limits. Scattered mucosal thickening present throughout the paranasal sinuses, worse on the right. Trace right mastoid effusion, of doubtful significance. Other: None. MRA HEAD FINDINGS Anterior circulation: Examination markedly degraded by motion artifact. Both internal carotid arteries are grossly patent through the siphons without visible stenosis or other abnormality. A1 segments patent bilaterally. Grossly normal anterior communicating artery complex. Anterior cerebral arteries patent without visible stenosis. No M1 stenosis or occlusion. Distal MCA branches perfused and symmetric. Posterior circulation: Visualized V4 segments patent without stenosis. Neither PICA well visualized on this motion degraded exam. Basilar patent without visible stenosis. Superior cerebellar arteries patent bilaterally. Both PCAs primarily supplied via the basilar. PCAs are patent to their distal aspects without visible stenosis. Anatomic variants: None significant.  No aneurysm. MRA NECK FINDINGS Aortic arch: Visualized aortic arch within normal limits for caliber standard 3 vessel morphology. No stenosis or other abnormality about the origin of the great vessels. Right carotid system: Right common and internal carotid arteries are patent with antegrade flow. No evidence for dissection. No hemodynamically significant stenosis about the right carotid artery system. Left carotid system: Left common and internal carotid arteries are patent with antegrade flow. No evidence for dissection. No hemodynamically significant stenosis about the left carotid artery system. Vertebral arteries: Both vertebral arteries arise from subclavian arteries. No  proximal subclavian artery stenosis. Vertebral arteries are largely codominant and patent without stenosis or dissection. Other: None IMPRESSION: MRI HEAD IMPRESSION: 1. 3.2 cm acute to early subacute left PCA distribution infarct. Associated petechial hemorrhage without frank hemorrhagic transformation or significant mass effect. 2. Underlying mild chronic microvascular ischemic disease with small remote right frontal and left cerebellar infarcts. MRA HEAD IMPRESSION: 1. Motion degraded exam. 2. Grossly negative intracranial MRA. No large vessel occlusion, hemodynamically significant stenosis, or other acute vascular abnormality. MRA NECK IMPRESSION: Wide patency of both carotid artery systems and vertebral arteries within the neck. No hemodynamically significant stenosis or other acute vascular abnormality. Electronically Signed   By: Rise Mu M.D.   On: 12/14/2022 17:58   ECHOCARDIOGRAM COMPLETE  Result Date: 12/14/2022    ECHOCARDIOGRAM REPORT   Patient Name:   Brooke [name] Date of Exam: 12/14/2022 Medical Rec #:  [id number]        Height:       64.0 in Accession #:    [id number]       Weight:       221.6 lb Date of Birth:  [birth year]       BSA:          2.043 m Patient Age:    66 years         BP:           155/77 mmHg Patient Gender: F                HR:           74 bpm. [facility] Procedure: 2D Echo, Color Doppler and Cardiac Doppler Indications:    Stroke I63.9  History:        Patient has no prior history of Echocardiogram examinations.                 Risk Factors:Diabetes, Hypertension and Dyslipidemia.  Sonographer:    Harriette Bouillon RDCS Referring Phys: [id number] OLADAPO ADEFESO IMPRESSIONS  1. Left ventricular ejection fraction, by estimation, is 60 to 65%. The left ventricle  Physician Discharge Summary   Patient: Brooke [name] MRN: [id number] DOB: [birth year]  Admit date:     12/13/2022  Discharge date: 12/15/22  Discharge Physician: Kendell Bane   PCP: [name], FNP   Recommendations at discharge:   Per current recommendation, to continue aspirin 81 mg and Plavix 75 mg for 21 days then continue aspirin Continue high-dose statin, Lipitor at 80 mg daily Follow-up with neurologist in 6-8 weeks Follow-up with PCP in 2-4 weeks Follow-up with 30-day Holter monitor per neurology recommendation  Discharge Diagnoses: Principal Problem:   Acute ischemic stroke [facility]) Active Problems:   Type 2 diabetes mellitus with hyperglycemia, with long-term current use of insulin (HCC)   Mixed hyperlipidemia   Essential hypertension   Obesity (BMI 30-39.9)   CKD (chronic kidney disease) stage 4, GFR 15-29 ml/min (HCC)   Thrombocytosis   Shortness of breath  Resolved Problems:   * No resolved hospital problems. *  Hospital Course: Brooke [name] is a 67 y.o. female with medical history significant of type 2 diabetes mellitus, chronic kidney disease stage IV, history of renal cell carcinoma, obesity, hypertension, hyperlipidemia who presents to the emergency department who presents with transitory loss of vision for about 30 minutes in the right eye.  She has since returned to baseline, but she was concerned that the symptoms may be related to stroke, so she presented to the ED for further evaluation and management.   ED Course:  BP was 167/77, other vital signs were within normal range.  Workup in the ED showed normal CBC except for platelets of 424, BMP was normal except for BUN of 24 and creatinine of 2.30.  Triglycerides 223, HDL 31, VLDL 45, sed rate 91 CT head without contrast showed acute to subacute left temporal occipital lobe infarction.  No intracranial hemorrhage. Chest x-ray showed no active cardiopulmonary disease Teleneurology was  consulted and recommended further stroke workup.  Plavix 300 mg followed by 75 mg daily and aspirin 300 to 325 mg load followed by 2 mg daily if no contraindications were recommended.   Hospitalist was asked to admit patient for further evaluation and management.     Acute ischemic stroke  -Acute/subacute 3.2 left PCA distribution infarct Improved right visual deficit  Bilateral carotid ultrasound >>> Minimal amount of bilateral atherosclerotic plaque, left-greater-than-right,  Echocardiogram >>>   MRI/ MRA of brain/neck>>>  3.2 cm acute to early subacute left PCA distribution infarct. Associated petechial hemorrhage without frank hemorrhagic transformation or significant mass effect. 2. Underlying mild chronic microvascular ischemic disease with small remote right frontal and left cerebellar infarcts.   MRA HEAD IMPRESSION:   Continue aspirin, Plavix and statin Continue fall precautions and neuro checks Lipid panel, LDL 81, triglyceride 223 Hemoglobin A1c: 8.2  Continue PT/SLP/OT eval and treat-follow-up recommendations Bedside swallow eval by nursing -past, tolerating p.o.  Teleneurology consulted Follow-up with neurology and 4-6 weeks    Chronic kidney disease stage IV Creatinine 2.30, this is within baseline range Renally adjust medications, avoid nephrotoxic agents/dehydration/hypotension Lab Results  Component Value Date   CREATININE 2.29 (H) 12/14/2022   CREATININE 2.30 (H) 12/13/2022   CREATININE 2.44 (H) 10/20/2022   HCTZ was discontinued, the rest of BP meds has been continued     Thrombocytosis possibly reactive Platelets 424, continue to monitor platelet levels   Essential hypertension -Massive hypertension was pursued, BP meds were held -HCTZ was discontinued rest of home BP meds to be resumed    Mixed hyperlipidemia Continue Lipitor-crease to high intensity  smoking, obesity, and infiltrative/myelofibrotic marrow processes. No scalp soft tissue abnormality. Sinuses/Orbits: Globes and orbital soft tissues within normal limits. Scattered mucosal thickening present throughout the paranasal sinuses, worse on the right. Trace right mastoid effusion, of doubtful significance. Other: None. MRA HEAD FINDINGS Anterior circulation: Examination markedly degraded by motion artifact. Both internal carotid arteries are grossly patent through the siphons without visible stenosis or other abnormality. A1 segments patent bilaterally. Grossly normal anterior communicating artery complex. Anterior cerebral arteries patent without visible stenosis. No M1 stenosis or occlusion. Distal MCA branches perfused and symmetric. Posterior circulation: Visualized V4 segments patent without stenosis. Neither PICA well visualized on this motion degraded exam. Basilar patent without visible stenosis. Superior cerebellar arteries patent bilaterally. Both PCAs primarily supplied via the basilar. PCAs are patent to their distal aspects without visible stenosis. Anatomic variants: None significant.  No aneurysm. MRA NECK FINDINGS Aortic arch: Visualized aortic arch within normal limits for caliber standard 3 vessel morphology. No stenosis or other abnormality about the origin of the great vessels. Right carotid system: Right common and internal carotid arteries are patent with antegrade flow. No evidence for dissection. No hemodynamically significant stenosis about the right carotid artery system. Left carotid system: Left common and internal carotid arteries are patent with antegrade flow. No evidence for dissection. No hemodynamically significant stenosis about the left carotid artery system. Vertebral arteries: Both vertebral arteries arise from subclavian arteries. No  proximal subclavian artery stenosis. Vertebral arteries are largely codominant and patent without stenosis or dissection. Other: None IMPRESSION: MRI HEAD IMPRESSION: 1. 3.2 cm acute to early subacute left PCA distribution infarct. Associated petechial hemorrhage without frank hemorrhagic transformation or significant mass effect. 2. Underlying mild chronic microvascular ischemic disease with small remote right frontal and left cerebellar infarcts. MRA HEAD IMPRESSION: 1. Motion degraded exam. 2. Grossly negative intracranial MRA. No large vessel occlusion, hemodynamically significant stenosis, or other acute vascular abnormality. MRA NECK IMPRESSION: Wide patency of both carotid artery systems and vertebral arteries within the neck. No hemodynamically significant stenosis or other acute vascular abnormality. Electronically Signed   By: Rise Mu M.D.   On: 12/14/2022 17:58   ECHOCARDIOGRAM COMPLETE  Result Date: 12/14/2022    ECHOCARDIOGRAM REPORT   Patient Name:   Brooke [name] Date of Exam: 12/14/2022 Medical Rec #:  [id number]        Height:       64.0 in Accession #:    [id number]       Weight:       221.6 lb Date of Birth:  [birth year]       BSA:          2.043 m Patient Age:    66 years         BP:           155/77 mmHg Patient Gender: F                HR:           74 bpm. [facility] Procedure: 2D Echo, Color Doppler and Cardiac Doppler Indications:    Stroke I63.9  History:        Patient has no prior history of Echocardiogram examinations.                 Risk Factors:Diabetes, Hypertension and Dyslipidemia.  Sonographer:    Harriette Bouillon RDCS Referring Phys: [id number] OLADAPO ADEFESO IMPRESSIONS  1. Left ventricular ejection fraction, by estimation, is 60 to 65%. The left ventricle  Physician Discharge Summary   Patient: Brooke [name] MRN: [id number] DOB: [birth year]  Admit date:     12/13/2022  Discharge date: 12/15/22  Discharge Physician: Kendell Bane   PCP: [name], FNP   Recommendations at discharge:   Per current recommendation, to continue aspirin 81 mg and Plavix 75 mg for 21 days then continue aspirin Continue high-dose statin, Lipitor at 80 mg daily Follow-up with neurologist in 6-8 weeks Follow-up with PCP in 2-4 weeks Follow-up with 30-day Holter monitor per neurology recommendation  Discharge Diagnoses: Principal Problem:   Acute ischemic stroke [facility]) Active Problems:   Type 2 diabetes mellitus with hyperglycemia, with long-term current use of insulin (HCC)   Mixed hyperlipidemia   Essential hypertension   Obesity (BMI 30-39.9)   CKD (chronic kidney disease) stage 4, GFR 15-29 ml/min (HCC)   Thrombocytosis   Shortness of breath  Resolved Problems:   * No resolved hospital problems. *  Hospital Course: Brooke [name] is a 67 y.o. female with medical history significant of type 2 diabetes mellitus, chronic kidney disease stage IV, history of renal cell carcinoma, obesity, hypertension, hyperlipidemia who presents to the emergency department who presents with transitory loss of vision for about 30 minutes in the right eye.  She has since returned to baseline, but she was concerned that the symptoms may be related to stroke, so she presented to the ED for further evaluation and management.   ED Course:  BP was 167/77, other vital signs were within normal range.  Workup in the ED showed normal CBC except for platelets of 424, BMP was normal except for BUN of 24 and creatinine of 2.30.  Triglycerides 223, HDL 31, VLDL 45, sed rate 91 CT head without contrast showed acute to subacute left temporal occipital lobe infarction.  No intracranial hemorrhage. Chest x-ray showed no active cardiopulmonary disease Teleneurology was  consulted and recommended further stroke workup.  Plavix 300 mg followed by 75 mg daily and aspirin 300 to 325 mg load followed by 2 mg daily if no contraindications were recommended.   Hospitalist was asked to admit patient for further evaluation and management.     Acute ischemic stroke  -Acute/subacute 3.2 left PCA distribution infarct Improved right visual deficit  Bilateral carotid ultrasound >>> Minimal amount of bilateral atherosclerotic plaque, left-greater-than-right,  Echocardiogram >>>   MRI/ MRA of brain/neck>>>  3.2 cm acute to early subacute left PCA distribution infarct. Associated petechial hemorrhage without frank hemorrhagic transformation or significant mass effect. 2. Underlying mild chronic microvascular ischemic disease with small remote right frontal and left cerebellar infarcts.   MRA HEAD IMPRESSION:   Continue aspirin, Plavix and statin Continue fall precautions and neuro checks Lipid panel, LDL 81, triglyceride 223 Hemoglobin A1c: 8.2  Continue PT/SLP/OT eval and treat-follow-up recommendations Bedside swallow eval by nursing -past, tolerating p.o.  Teleneurology consulted Follow-up with neurology and 4-6 weeks    Chronic kidney disease stage IV Creatinine 2.30, this is within baseline range Renally adjust medications, avoid nephrotoxic agents/dehydration/hypotension Lab Results  Component Value Date   CREATININE 2.29 (H) 12/14/2022   CREATININE 2.30 (H) 12/13/2022   CREATININE 2.44 (H) 10/20/2022   HCTZ was discontinued, the rest of BP meds has been continued     Thrombocytosis possibly reactive Platelets 424, continue to monitor platelet levels   Essential hypertension -Massive hypertension was pursued, BP meds were held -HCTZ was discontinued rest of home BP meds to be resumed    Mixed hyperlipidemia Continue Lipitor-crease to high intensity  Physician Discharge Summary   Patient: Brooke [name] MRN: [id number] DOB: [birth year]  Admit date:     12/13/2022  Discharge date: 12/15/22  Discharge Physician: Kendell Bane   PCP: [name], FNP   Recommendations at discharge:   Per current recommendation, to continue aspirin 81 mg and Plavix 75 mg for 21 days then continue aspirin Continue high-dose statin, Lipitor at 80 mg daily Follow-up with neurologist in 6-8 weeks Follow-up with PCP in 2-4 weeks Follow-up with 30-day Holter monitor per neurology recommendation  Discharge Diagnoses: Principal Problem:   Acute ischemic stroke [facility]) Active Problems:   Type 2 diabetes mellitus with hyperglycemia, with long-term current use of insulin (HCC)   Mixed hyperlipidemia   Essential hypertension   Obesity (BMI 30-39.9)   CKD (chronic kidney disease) stage 4, GFR 15-29 ml/min (HCC)   Thrombocytosis   Shortness of breath  Resolved Problems:   * No resolved hospital problems. *  Hospital Course: Brooke [name] is a 67 y.o. female with medical history significant of type 2 diabetes mellitus, chronic kidney disease stage IV, history of renal cell carcinoma, obesity, hypertension, hyperlipidemia who presents to the emergency department who presents with transitory loss of vision for about 30 minutes in the right eye.  She has since returned to baseline, but she was concerned that the symptoms may be related to stroke, so she presented to the ED for further evaluation and management.   ED Course:  BP was 167/77, other vital signs were within normal range.  Workup in the ED showed normal CBC except for platelets of 424, BMP was normal except for BUN of 24 and creatinine of 2.30.  Triglycerides 223, HDL 31, VLDL 45, sed rate 91 CT head without contrast showed acute to subacute left temporal occipital lobe infarction.  No intracranial hemorrhage. Chest x-ray showed no active cardiopulmonary disease Teleneurology was  consulted and recommended further stroke workup.  Plavix 300 mg followed by 75 mg daily and aspirin 300 to 325 mg load followed by 2 mg daily if no contraindications were recommended.   Hospitalist was asked to admit patient for further evaluation and management.     Acute ischemic stroke  -Acute/subacute 3.2 left PCA distribution infarct Improved right visual deficit  Bilateral carotid ultrasound >>> Minimal amount of bilateral atherosclerotic plaque, left-greater-than-right,  Echocardiogram >>>   MRI/ MRA of brain/neck>>>  3.2 cm acute to early subacute left PCA distribution infarct. Associated petechial hemorrhage without frank hemorrhagic transformation or significant mass effect. 2. Underlying mild chronic microvascular ischemic disease with small remote right frontal and left cerebellar infarcts.   MRA HEAD IMPRESSION:   Continue aspirin, Plavix and statin Continue fall precautions and neuro checks Lipid panel, LDL 81, triglyceride 223 Hemoglobin A1c: 8.2  Continue PT/SLP/OT eval and treat-follow-up recommendations Bedside swallow eval by nursing -past, tolerating p.o.  Teleneurology consulted Follow-up with neurology and 4-6 weeks    Chronic kidney disease stage IV Creatinine 2.30, this is within baseline range Renally adjust medications, avoid nephrotoxic agents/dehydration/hypotension Lab Results  Component Value Date   CREATININE 2.29 (H) 12/14/2022   CREATININE 2.30 (H) 12/13/2022   CREATININE 2.44 (H) 10/20/2022   HCTZ was discontinued, the rest of BP meds has been continued     Thrombocytosis possibly reactive Platelets 424, continue to monitor platelet levels   Essential hypertension -Massive hypertension was pursued, BP meds were held -HCTZ was discontinued rest of home BP meds to be resumed    Mixed hyperlipidemia Continue Lipitor-crease to high intensity  Physician Discharge Summary   Patient: Brooke [name] MRN: [id number] DOB: [birth year]  Admit date:     12/13/2022  Discharge date: 12/15/22  Discharge Physician: Kendell Bane   PCP: [name], FNP   Recommendations at discharge:   Per current recommendation, to continue aspirin 81 mg and Plavix 75 mg for 21 days then continue aspirin Continue high-dose statin, Lipitor at 80 mg daily Follow-up with neurologist in 6-8 weeks Follow-up with PCP in 2-4 weeks Follow-up with 30-day Holter monitor per neurology recommendation  Discharge Diagnoses: Principal Problem:   Acute ischemic stroke [facility]) Active Problems:   Type 2 diabetes mellitus with hyperglycemia, with long-term current use of insulin (HCC)   Mixed hyperlipidemia   Essential hypertension   Obesity (BMI 30-39.9)   CKD (chronic kidney disease) stage 4, GFR 15-29 ml/min (HCC)   Thrombocytosis   Shortness of breath  Resolved Problems:   * No resolved hospital problems. *  Hospital Course: Brooke [name] is a 67 y.o. female with medical history significant of type 2 diabetes mellitus, chronic kidney disease stage IV, history of renal cell carcinoma, obesity, hypertension, hyperlipidemia who presents to the emergency department who presents with transitory loss of vision for about 30 minutes in the right eye.  She has since returned to baseline, but she was concerned that the symptoms may be related to stroke, so she presented to the ED for further evaluation and management.   ED Course:  BP was 167/77, other vital signs were within normal range.  Workup in the ED showed normal CBC except for platelets of 424, BMP was normal except for BUN of 24 and creatinine of 2.30.  Triglycerides 223, HDL 31, VLDL 45, sed rate 91 CT head without contrast showed acute to subacute left temporal occipital lobe infarction.  No intracranial hemorrhage. Chest x-ray showed no active cardiopulmonary disease Teleneurology was  consulted and recommended further stroke workup.  Plavix 300 mg followed by 75 mg daily and aspirin 300 to 325 mg load followed by 2 mg daily if no contraindications were recommended.   Hospitalist was asked to admit patient for further evaluation and management.     Acute ischemic stroke  -Acute/subacute 3.2 left PCA distribution infarct Improved right visual deficit  Bilateral carotid ultrasound >>> Minimal amount of bilateral atherosclerotic plaque, left-greater-than-right,  Echocardiogram >>>   MRI/ MRA of brain/neck>>>  3.2 cm acute to early subacute left PCA distribution infarct. Associated petechial hemorrhage without frank hemorrhagic transformation or significant mass effect. 2. Underlying mild chronic microvascular ischemic disease with small remote right frontal and left cerebellar infarcts.   MRA HEAD IMPRESSION:   Continue aspirin, Plavix and statin Continue fall precautions and neuro checks Lipid panel, LDL 81, triglyceride 223 Hemoglobin A1c: 8.2  Continue PT/SLP/OT eval and treat-follow-up recommendations Bedside swallow eval by nursing -past, tolerating p.o.  Teleneurology consulted Follow-up with neurology and 4-6 weeks    Chronic kidney disease stage IV Creatinine 2.30, this is within baseline range Renally adjust medications, avoid nephrotoxic agents/dehydration/hypotension Lab Results  Component Value Date   CREATININE 2.29 (H) 12/14/2022   CREATININE 2.30 (H) 12/13/2022   CREATININE 2.44 (H) 10/20/2022   HCTZ was discontinued, the rest of BP meds has been continued     Thrombocytosis possibly reactive Platelets 424, continue to monitor platelet levels   Essential hypertension -Massive hypertension was pursued, BP meds were held -HCTZ was discontinued rest of home BP meds to be resumed    Mixed hyperlipidemia Continue Lipitor-crease to high intensity

## 2022-12-20 ENCOUNTER — Telehealth: Payer: Self-pay | Admitting: *Deleted

## 2022-12-20 NOTE — Telephone Encounter (Signed)
Fax from [facility] RE: [place] 3 Note - not currently made Please change to [employer] 3 Plus

## 2022-12-21 ENCOUNTER — Other Ambulatory Visit: Payer: Self-pay | Admitting: Family Medicine

## 2022-12-21 DIAGNOSIS — Z794 Long term (current) use of insulin: Secondary | ICD-10-CM

## 2022-12-21 MED ORDER — FREESTYLE LIBRE 3 PLUS SENSOR MISC
5 refills | Status: DC
Start: 2022-12-21 — End: 2023-05-19

## 2022-12-21 NOTE — Telephone Encounter (Signed)
New RX sent to pharmacy.

## 2022-12-23 NOTE — Patient Instructions (Incomplete)
Restart Lisinopril 10 mg every other day.    Monitoring your BP at home   Your BP was elevated today in office  Please keep a log of your BP at home.  The best time to take BP is 1st thing in the morning after waking.  Sit for 5 minutes with feet flat on the floor, arm at heart level.  Options for BP cuffs are at [employer], [employer], [employer], [facility] & [facility].  We will review measurements at follow up and determine plan for BP management.  If you have access, you can send a message in MyChart with your measurements prior to your follow up appointment.  The brand I recommend to get is [employer] (Bronze).   Please call with your BP in 2 weeks!    Our records indicate that you are due for your screening mammogram.  Please call the imaging center that does your yearly mammograms to make an appointment for a mammogram at your earliest convenience. Our office also has a mobile unit through the [facility] that comes to our location. Please call our office if you would like to make an appointment.

## 2022-12-26 ENCOUNTER — Telehealth: Payer: Self-pay

## 2022-12-26 NOTE — Telephone Encounter (Signed)
Received fax from [employer] stating patient has requested delay in hookup.

## 2022-12-27 ENCOUNTER — Encounter: Payer: Self-pay | Admitting: Family Medicine

## 2022-12-27 ENCOUNTER — Ambulatory Visit (INDEPENDENT_AMBULATORY_CARE_PROVIDER_SITE_OTHER): Payer: Medicare HMO | Admitting: Family Medicine

## 2022-12-27 VITALS — BP 160/75 | HR 73 | Temp 98.5°F | Ht 64.0 in | Wt 218.0 lb

## 2022-12-27 DIAGNOSIS — Z794 Long term (current) use of insulin: Secondary | ICD-10-CM

## 2022-12-27 DIAGNOSIS — I639 Cerebral infarction, unspecified: Secondary | ICD-10-CM

## 2022-12-27 DIAGNOSIS — E1165 Type 2 diabetes mellitus with hyperglycemia: Secondary | ICD-10-CM

## 2022-12-27 DIAGNOSIS — I152 Hypertension secondary to endocrine disorders: Secondary | ICD-10-CM | POA: Diagnosis not present

## 2022-12-27 DIAGNOSIS — E1159 Type 2 diabetes mellitus with other circulatory complications: Secondary | ICD-10-CM

## 2022-12-27 NOTE — Progress Notes (Signed)
Subjective:  Patient ID: Brooke [name], female    DOB: December 17, 1955, 67 y.o.   MRN: [id number]  Patient Care Team: [name], FNP as PCP - General (Family Medicine) Danella Maiers, [facility] (Pharmacist) Delora Fuel, [place] (Optometry) Dione Booze, Bertram Millard, MD as Consulting Physician (Ophthalmology) Audrie Gallus, RN as [facility] Care Management   Chief Complaint:  Hospitalization Follow-up (Stroke f/u)  HPI: Brooke [name] is a 67 y.o. female presenting on 12/27/2022 for Hospitalization Follow-up (Stroke f/u) History was collected from patient and chart review. She was admitted to [facility] on 12/13/2022 with symptoms of transitory loss of vision for ~30 minutes in her right eye. She Reports that it returned to baseline after 30 minutes. She has a history of T2DM, CKD stage IV, history of Renal cell carcinoma, obesity, HTN, & HLD. 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 No intracranial hemorrhage. She was loaded on plavix and aspirin. She was discharged on 10/10 with instructions to continue plavix for 21 days and then continue ASA 81 mg indefinitely. She transitioned to 80 mg of Lipitor. In addition, she was provided with follow up to neurology who instructed and ordered a 30 day Holter monitor. She has received the device, but has not yet applied it.  States that she feels good since being at home.  She is monitoring BG with CGM. States that it is good in the morning.  She has an appt with Neurology in January. She reports compliance with both plavix and ASA. She verbalized understanding to stop plavix after 21 days. She is taking atorvastatin. She is tolerating it well. Has an appt on Tuesday with ophthalmology States that she believed lisinopril was discontinued by hospital. She has not been taking it. Per note, they stopped hydrochlorothiazide and patient was instructed to continue antihypertensives. Per  Wolfgang Phoenix, she was taking lisinopril 10 mg every other day. Believes that her vision is back to normal now. She would like to follow up with PT/OT to ensure no loss of function.   Relevant past medical, surgical, family, and social history reviewed and updated as indicated.  Allergies and medications reviewed and updated. Data reviewed: Chart in Epic.   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   Hypertension    Hyperuricemia 11/27/2020   Obesity (BMI 35.0-39.9 without comorbidity)    Renal mass 08/04/2021   Vertigo    Vitamin D insufficiency 11/27/2020    Past Surgical History:  Procedure Laterality Date   BREAST SURGERY Right 1974   OPERATIVE ULTRASOUND N/A 08/04/2021   Procedure: OPERATIVE ULTRASOUND;  Surgeon: Sebastian Ache, MD;  Location: [facility];  Service: Urology;  Laterality: N/A;   ROBOTIC ASSITED PARTIAL NEPHRECTOMY Right 08/04/2021   Procedure: XI ROBOTIC ASSITED RADICAL NEPHRECTOMY;  Surgeon: Sebastian Ache, MD;  Location: [facility];  Service: Urology;  Laterality: Right;    Social History   Socioeconomic History   Marital status: Married    Spouse name: Donnie   Number of children: 1   Years of education: Not on file   Highest education level: Not on file  Occupational History  Not on file  Tobacco Use   Smoking status: Never   Smokeless tobacco: Never  Vaping Use   Vaping status: Never Used  Substance and Sexual Activity   Alcohol use: No   Drug use: No   Sexual activity: Never  Other Topics Concern   Not on file  Social History Narrative   1 daughter    1 grandson   Social Determinants of Health   Financial Resource 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  (04/25/2022)   Overall Financial Resource  Strain (CARDIA)    Difficulty of Paying Living Expenses: Not hard at all  Recent Concern: Financial Resource Strain - Medium Risk (02/14/2022)   Overall Financial Resource Strain (CARDIA)    Difficulty of Paying Living Expenses: Somewhat hard  Food Insecurity: No Food Insecurity (12/14/2022)   Hunger Vital Sign    Worried About Running Out of Food in the Last Year: Never true    Ran Out of Food in the Last Year: Never true  Transportation Needs: No Transportation Needs (12/14/2022)   PRAPARE - Administrator, Civil Service (Medical): No    Lack of Transportation (Non-Medical): No  Physical Activity: Inactive (04/25/2022)   Exercise Vital Sign    Days of Exercise per Week: 0 days    Minutes of Exercise per Session: 0 min  Stress: No Stress Concern Present (04/25/2022)   [employer] of Occupational Health - Occupational Stress Questionnaire    Feeling of Stress : Not at all  Social Connections: Moderately Integrated (04/25/2022)   Social Connection and Isolation Panel [NHANES]    Frequency of Communication with Friends and Family: More than three times a week    Frequency of Social Gatherings with Friends and Family: More than three times a week    Attends Religious Services: More than 4 times per year    Active Member of [employer] or Organizations: No    Attends Banker Meetings: Never    Marital Status: Married  Catering manager Violence: Not At Risk (12/14/2022)   Humiliation, Afraid, Rape, and Kick questionnaire    Fear of Current or Ex-Partner: No    Emotionally Abused: No    Physically Abused: No    Sexually Abused: No    Outpatient Encounter Medications as of 12/27/2022  Medication Sig   acetaminophen (TYLENOL) 500 MG tablet Take 1,000 mg by mouth every 8 (eight) hours as needed for moderate pain.   albuterol (VENTOLIN HFA) 108 (90 Base) MCG/ACT inhaler Inhale 2 puffs into the lungs every 6 (six) hours as needed for wheezing or shortness of breath.    amLODipine (NORVASC) 10 MG tablet Take 1 tablet (10 mg total) by mouth daily.   Ascorbic Acid (VITA-C PO) Take 1 tablet by mouth 2 (two) times a week.   aspirin EC 81 MG tablet Take 1 tablet (81 mg total) by mouth daily. Swallow whole.   atenolol (TENORMIN) 100 MG tablet Take 1 tablet (100 mg total) by mouth every morning.   atorvastatin (LIPITOR) 80 MG tablet Take 1 tablet (80 mg total) by mouth daily.   budesonide-formoterol (SYMBICORT) 80-4.5 MCG/ACT inhaler Inhale 2 puffs into the lungs 2 (two) times daily.   Cholecalciferol 25 MCG (1000 UT) capsule Take 1,000 Units by mouth every other day.   clopidogrel (PLAVIX) 75 MG tablet Take 1 tablet (75 mg total) by mouth daily for 21 days.   Continuous Glucose Receiver (FREESTYLE LIBRE 3 READER) DEVI 1 Device by Does not apply route as directed. with compatible  Freestyle Libre 3 sensor to monitor glucose continuously. DX:E11.65   Continuous Glucose Sensor (FREESTYLE LIBRE 3 PLUS SENSOR) MISC Change sensor every 15 days.   Dulaglutide (TRULICITY) 4.5 MG/0.5ML SOPN Inject 4.5 mg as directed once a week.   Insulin Glargine (BASAGLAR KWIKPEN) 100 UNIT/ML Inject 37 Units into the skin daily.   insulin lispro (HUMALOG KWIKPEN) 100 UNIT/ML KwikPen Inject 10-15 Units into the skin See admin instructions. 10 units WITH BREAKFAST AND LUNCH, TAKE 15 units at [facility]   Insulin Pen Needle 31G X 6 MM MISC Use to inject insulin 4 times daily as directed. DX: E11.65   lisinopril (ZESTRIL) 10 MG tablet Take 10 mg by mouth every other day.   Multiple Vitamin (MULTIVITAMIN ADULT PO) Take 1 tablet by mouth every other day.   No facility-administered encounter medications on file as of 12/27/2022.    Allergies  Allergen Reactions   Jardiance [Empagliflozin]     Holding per nephro, pt reports no allergy, Dr just didn't want pt taking it   Chauncey Mann [Finerenone]     Holding per nephro, pt reports no allergy, Dr just didn't want pt taking it   Metformin And Related      Contraindicated due to GFR; GI side effects Pt states not a real allergy    Review of Systems As per HPI  Objective:  BP (!) 160/75   Pulse 73   Temp 98.5 F (36.9 C)   Ht 5\' 4"  (1.626 m)   Wt 218 lb (98.9 kg)   LMP 03/16/2012   SpO2 98%   BMI 37.42 kg/m    Wt Readings from Last 3 Encounters:  12/27/22 218 lb (98.9 kg)  12/14/22 221 lb 9 oz (100.5 kg)  11/10/22 221 lb (100.2 kg)    Physical Exam Constitutional:      General: She is awake. She is not in acute distress.    Appearance: Normal appearance. She is well-developed and well-groomed. She is not ill-appearing, toxic-appearing or diaphoretic.  Eyes:     General: No visual field deficit. Cardiovascular:     Rate and Rhythm: Normal rate and regular rhythm.     Pulses: Normal pulses.          Radial pulses are 2+ on the right side and 2+ on the left side.       Posterior tibial pulses are 2+ on the right side and 2+ on the left side.     Heart sounds: Normal heart sounds. No murmur heard.    No gallop.  Pulmonary:     Effort: Pulmonary effort is normal. No respiratory distress.     Breath sounds: Normal breath sounds. No stridor. No wheezing, rhonchi or rales.  Musculoskeletal:     Cervical back: Full passive range of motion without pain and neck supple.     Right lower leg: No edema.     Left lower leg: No edema.  Skin:    General: Skin is warm.     Capillary Refill: Capillary refill takes less than 2 seconds.  Neurological:     General: No focal deficit present.     Mental Status: She is alert, oriented to person, place, and time and easily aroused. Mental status is at baseline.     GCS: GCS eye subscore is 4. GCS verbal subscore is 5. GCS motor subscore is 6.     Cranial Nerves: Cranial nerves 2-12 are intact. No cranial nerve deficit, dysarthria or facial asymmetry.     Sensory: Sensation  is intact. No sensory deficit.     Motor: Motor function is intact. No weakness, tremor, atrophy, abnormal muscle tone,  seizure activity or pronator drift.     Coordination: Romberg sign negative. Coordination normal. Heel to Shin Test normal.     Gait: Gait is intact. Gait and tandem walk normal.  Psychiatric:        Attention and Perception: Attention and perception normal.        Mood and Affect: Mood and affect normal.        Speech: Speech normal.        Behavior: Behavior normal. Behavior is cooperative.        Thought Content: Thought content normal. Thought content does not include homicidal or suicidal ideation. Thought content does not include homicidal or suicidal plan.        Cognition and Memory: Cognition and memory normal.        Judgment: Judgment normal.     Results for orders placed or performed during the hospital encounter of 12/13/22  Comprehensive metabolic panel  Result Value Ref Range   Sodium 137 135 - 145 mmol/L   Potassium 4.5 3.5 - 5.1 mmol/L   Chloride 107 98 - 111 mmol/L   CO2 23 22 - 32 mmol/L   Glucose, Bld 136 (H) 70 - 99 mg/dL   BUN 24 (H) 8 - 23 mg/dL   Creatinine, Ser 7.84 (H) 0.44 - 1.00 mg/dL   Calcium 8.6 (L) 8.9 - 10.3 mg/dL   Total Protein 7.6 6.5 - 8.1 g/dL   Albumin 3.6 3.5 - 5.0 g/dL   AST 16 15 - 41 U/L   ALT 16 0 - 44 U/L   Alkaline Phosphatase 99 38 - 126 U/L   Total Bilirubin 0.4 0.3 - 1.2 mg/dL   GFR, Estimated 23 (L) >60 mL/min   Anion gap 7 5 - 15  CBC with Differential  Result Value Ref Range   WBC 9.4 4.0 - 10.5 K/uL   RBC 4.41 3.87 - 5.11 MIL/uL   Hemoglobin 11.6 (L) 12.0 - 15.0 g/dL   HCT 69.6 29.5 - 28.4 %   MCV 85.7 80.0 - 100.0 fL   MCH 26.3 26.0 - 34.0 pg   MCHC 30.7 30.0 - 36.0 g/dL   RDW 13.2 44.0 - 10.2 %   Platelets 424 (H) 150 - 400 K/uL   nRBC 0.0 0.0 - 0.2 %   Neutrophils Relative % 57 %   Neutro Abs 5.3 1.7 - 7.7 K/uL   Lymphocytes Relative 27 %   Lymphs Abs 2.6 0.7 - 4.0 K/uL   Monocytes Relative 5 %   Monocytes Absolute 0.5 0.1 - 1.0 K/uL   Eosinophils Relative 10 %   Eosinophils Absolute 0.9 (H) 0.0 - 0.5 K/uL    Basophils Relative 1 %   Basophils Absolute 0.1 0.0 - 0.1 K/uL   Immature Granulocytes 0 %   Abs Immature Granulocytes 0.03 0.00 - 0.07 K/uL  Protime-INR  Result Value Ref Range   Prothrombin Time 14.0 11.4 - 15.2 seconds   INR 1.1 0.8 - 1.2  Sedimentation rate  Result Value Ref Range   Sed Rate 91 (H) 0 - 22 mm/hr  C-reactive protein  Result Value Ref Range   CRP 0.5 <1.0 mg/dL  Hemoglobin V2Z  Result Value Ref Range   Hgb A1c MFr Bld 8.2 (H) 4.8 - 5.6 %   Mean Plasma Glucose 188.64 mg/dL  Lipid panel  Result Value Ref Range  Cholesterol 157 0 - 200 mg/dL   Triglycerides 161 (H) <150 mg/dL   HDL 31 (L) >09 mg/dL   Total CHOL/HDL Ratio 5.1 RATIO   VLDL 45 (H) 0 - 40 mg/dL   LDL Cholesterol 81 0 - 99 mg/dL  HIV Antibody (routine testing w rflx)  Result Value Ref Range   HIV Screen 4th Generation wRfx Non Reactive Non Reactive  Comprehensive metabolic panel  Result Value Ref Range   Sodium 136 135 - 145 mmol/L   Potassium 4.0 3.5 - 5.1 mmol/L   Chloride 105 98 - 111 mmol/L   CO2 20 (L) 22 - 32 mmol/L   Glucose, Bld 134 (H) 70 - 99 mg/dL   BUN 24 (H) 8 - 23 mg/dL   Creatinine, Ser 6.04 (H) 0.44 - 1.00 mg/dL   Calcium 8.4 (L) 8.9 - 10.3 mg/dL   Total Protein 6.7 6.5 - 8.1 g/dL   Albumin 3.1 (L) 3.5 - 5.0 g/dL   AST 13 (L) 15 - 41 U/L   ALT 16 0 - 44 U/L   Alkaline Phosphatase 88 38 - 126 U/L   Total Bilirubin 0.5 0.3 - 1.2 mg/dL   GFR, Estimated 23 (L) >60 mL/min   Anion gap 11 5 - 15  CBC  Result Value Ref Range   WBC 9.4 4.0 - 10.5 K/uL   RBC 3.94 3.87 - 5.11 MIL/uL   Hemoglobin 10.4 (L) 12.0 - 15.0 g/dL   HCT 54.0 (L) 98.1 - 19.1 %   MCV 85.5 80.0 - 100.0 fL   MCH 26.4 26.0 - 34.0 pg   MCHC 30.9 30.0 - 36.0 g/dL   RDW 47.8 29.5 - 62.1 %   Platelets 375 150 - 400 K/uL   nRBC 0.0 0.0 - 0.2 %  Glucose, capillary  Result Value Ref Range   Glucose-Capillary 133 (H) 70 - 99 mg/dL  Glucose, capillary  Result Value Ref Range   Glucose-Capillary 177 (H) 70 - 99  mg/dL  Glucose, capillary  Result Value Ref Range   Glucose-Capillary 244 (H) 70 - 99 mg/dL  Glucose, capillary  Result Value Ref Range   Glucose-Capillary 143 (H) 70 - 99 mg/dL  Glucose, capillary  Result Value Ref Range   Glucose-Capillary 145 (H) 70 - 99 mg/dL  CBG monitoring, ED  Result Value Ref Range   Glucose-Capillary 127 (H) 70 - 99 mg/dL  ECHOCARDIOGRAM COMPLETE  Result Value Ref Range   Weight 3,545 oz   Height 64 in   BP 155/77 mmHg   Area-P 1/2 3.00 cm2   S' Lateral 4.40 cm   Est EF 60 - 65%        12/27/2022    3:28 PM 11/10/2022    1:42 PM 10/25/2022   11:03 AM 07/21/2022    1:35 PM 06/23/2022    1:16 PM  Depression screen PHQ 2/9  Decreased Interest 0 0 0 0 0  Down, Depressed, Hopeless 0 0 0 0 0  PHQ - 2 Score 0 0 0 0 0  Altered sleeping 0 0 0 0 0  Tired, decreased energy 0 0 0 0 0  Change in appetite 0 0 0 0 0  Feeling bad or failure about yourself  0 0 0 0 0  Trouble concentrating 0 0 0 0 0  Moving slowly or fidgety/restless 0 0 0 0 0  Suicidal thoughts 0 0 0 0 0  PHQ-9 Score 0 0 0 0 0  Difficult doing work/chores Not difficult at  all Not difficult at all Not difficult at all Not difficult at all Not difficult at all       12/27/2022    3:28 PM 11/10/2022    1:42 PM 10/25/2022   11:03 AM 07/21/2022    1:36 PM  GAD 7 : Generalized Anxiety Score  Nervous, Anxious, on Edge 0 0 0 0  Control/stop worrying 0 0 0 0  Worry too much - different things 0 0 0 0  Trouble relaxing 0 0 0 0  Restless 0 0 0 0  Easily annoyed or irritable 0 0 0 0  Afraid - awful might happen 0 0 0 0  Total GAD 7 Score 0 0 0 0  Anxiety Difficulty  Not difficult at all Not difficult at all Not difficult at all    Pertinent labs & imaging results that were available during my care of the patient were reviewed by me and considered in my medical decision making.  Assessment & Plan:  [place] was seen today for hospitalization follow-up.  Diagnoses and all orders for this  visit:  Cerebrovascular accident (CVA), unspecified mechanism (HCC) Labs as below. Will communicate results to patient once available. Will await results to determine next steps.  Referral placed as below.  Neuro exam reassuring.  Patient to follow up with neurology and with ophthalmology.  Patient to continue aspirin and plavix until completed. Then continue aspirin. Patient to continue lipitor at 80 mg.  Reviewed instructions for holter monitor with patient. Demonstrated holter application and use.  Reviewed discharge note from [facility] 12/15/22  -     CMP14+EGFR -     CBC with Differential/Platelet -     Magnesium -     Ambulatory referral to Physical Therapy -     Ambulatory referral to Occupational Therapy  Type 2 diabetes mellitus with hyperglycemia, with long-term current use of insulin (HCC) Patient to continue current medications. Follow up in 3 months for labs to monitor A1C. Continue to monitor BG with CGM.    Hypertension associated with diabetes (HCC) Not at goal in office today. Discussed with patient continuing lisinopril 10 mg every other day as prescribed by Nephrology. Patient to monitor BP at home. Patient to report measurements in 2 weeks.   Continue all other maintenance medications.  Follow up plan: Return in about 3 months (around 03/29/2023) for Chronic Condition Follow up.   Continue healthy lifestyle choices, including diet (rich in fruits, vegetables, and lean proteins, and low in salt and simple carbohydrates) and exercise (at least 30 minutes of moderate physical activity daily).  Written and verbal instructions provided   The above assessment and management plan was discussed with the patient. The patient verbalized understanding of and has agreed to the management plan. Patient is aware to call the clinic if they develop any new symptoms or if symptoms persist or worsen. Patient is aware when to return to the clinic for a follow-up visit. Patient  educated on when it is appropriate to go to the emergency department.   Neale Burly, DNP-FNP [facility] [address] [place], [place] [address] [phone]

## 2022-12-28 DIAGNOSIS — I498 Other specified cardiac arrhythmias: Secondary | ICD-10-CM | POA: Diagnosis not present

## 2022-12-28 LAB — CBC WITH DIFFERENTIAL/PLATELET
Basophils Absolute: 0.1 10*3/uL (ref 0.0–0.2)
Basos: 1 %
EOS (ABSOLUTE): 1.4 10*3/uL — ABNORMAL HIGH (ref 0.0–0.4)
Eos: 17 %
Hematocrit: 35.6 % (ref 34.0–46.6)
Hemoglobin: 11.3 g/dL (ref 11.1–15.9)
Immature Grans (Abs): 0 10*3/uL (ref 0.0–0.1)
Immature Granulocytes: 0 %
Lymphocytes Absolute: 2.7 10*3/uL (ref 0.7–3.1)
Lymphs: 32 %
MCH: 26.7 pg (ref 26.6–33.0)
MCHC: 31.7 g/dL (ref 31.5–35.7)
MCV: 84 fL (ref 79–97)
Monocytes Absolute: 0.6 10*3/uL (ref 0.1–0.9)
Monocytes: 7 %
Neutrophils Absolute: 3.8 10*3/uL (ref 1.4–7.0)
Neutrophils: 43 %
Platelets: 435 10*3/uL (ref 150–450)
RBC: 4.24 x10E6/uL (ref 3.77–5.28)
RDW: 14.5 % (ref 11.7–15.4)
WBC: 8.6 10*3/uL (ref 3.4–10.8)

## 2022-12-28 LAB — CMP14+EGFR
ALT: 18 [IU]/L (ref 0–32)
AST: 18 [IU]/L (ref 0–40)
Albumin: 4.2 g/dL (ref 3.9–4.9)
Alkaline Phosphatase: 119 [IU]/L (ref 44–121)
BUN/Creatinine Ratio: 10 — ABNORMAL LOW (ref 12–28)
BUN: 26 mg/dL (ref 8–27)
Bilirubin Total: 0.2 mg/dL (ref 0.0–1.2)
CO2: 21 mmol/L (ref 20–29)
Calcium: 9.3 mg/dL (ref 8.7–10.3)
Chloride: 106 mmol/L (ref 96–106)
Creatinine, Ser: 2.48 mg/dL — ABNORMAL HIGH (ref 0.57–1.00)
Globulin, Total: 2.8 g/dL (ref 1.5–4.5)
Glucose: 103 mg/dL — ABNORMAL HIGH (ref 70–99)
Potassium: 4.8 mmol/L (ref 3.5–5.2)
Sodium: 143 mmol/L (ref 134–144)
Total Protein: 7 g/dL (ref 6.0–8.5)
eGFR: 21 mL/min/{1.73_m2} — ABNORMAL LOW (ref >59)

## 2022-12-28 LAB — MAGNESIUM: Magnesium: 2.1 mg/dL (ref 1.6–2.3)

## 2022-12-28 NOTE — Progress Notes (Signed)
CMP stable. Recommend patient continue to follow up with Nephrology. Hgb/Hct within normal range.

## 2022-12-28 NOTE — Addendum Note (Signed)
Addended by: Neale Burly on: 12/28/2022 01:31 PM   Modules accepted: Level of Service

## 2022-12-29 ENCOUNTER — Ambulatory Visit: Payer: Medicare HMO | Attending: Internal Medicine

## 2022-12-29 DIAGNOSIS — I639 Cerebral infarction, unspecified: Secondary | ICD-10-CM

## 2023-01-03 DIAGNOSIS — E119 Type 2 diabetes mellitus without complications: Secondary | ICD-10-CM | POA: Diagnosis not present

## 2023-01-03 DIAGNOSIS — G43B0 Ophthalmoplegic migraine, not intractable: Secondary | ICD-10-CM | POA: Diagnosis not present

## 2023-01-03 DIAGNOSIS — H43812 Vitreous degeneration, left eye: Secondary | ICD-10-CM | POA: Diagnosis not present

## 2023-01-03 DIAGNOSIS — H40023 Open angle with borderline findings, high risk, bilateral: Secondary | ICD-10-CM | POA: Diagnosis not present

## 2023-01-03 DIAGNOSIS — I639 Cerebral infarction, unspecified: Secondary | ICD-10-CM | POA: Diagnosis not present

## 2023-01-03 DIAGNOSIS — H25813 Combined forms of age-related cataract, bilateral: Secondary | ICD-10-CM | POA: Diagnosis not present

## 2023-01-03 LAB — HM DIABETES EYE EXAM

## 2023-01-17 ENCOUNTER — Other Ambulatory Visit: Payer: Self-pay | Admitting: Family Medicine

## 2023-01-17 DIAGNOSIS — I152 Hypertension secondary to endocrine disorders: Secondary | ICD-10-CM

## 2023-01-25 ENCOUNTER — Ambulatory Visit: Payer: Medicare HMO | Admitting: Family Medicine

## 2023-01-25 ENCOUNTER — Other Ambulatory Visit: Payer: Medicare HMO

## 2023-02-01 ENCOUNTER — Encounter: Payer: Self-pay | Admitting: Family Medicine

## 2023-02-08 NOTE — Progress Notes (Signed)
No retinopathy. Repeat screening in one year.

## 2023-02-09 DIAGNOSIS — H52223 Regular astigmatism, bilateral: Secondary | ICD-10-CM | POA: Diagnosis not present

## 2023-02-09 DIAGNOSIS — H524 Presbyopia: Secondary | ICD-10-CM | POA: Diagnosis not present

## 2023-02-20 ENCOUNTER — Ambulatory Visit: Payer: Medicare HMO | Admitting: Nurse Practitioner

## 2023-02-20 DIAGNOSIS — J209 Acute bronchitis, unspecified: Secondary | ICD-10-CM | POA: Diagnosis not present

## 2023-02-20 DIAGNOSIS — Z20822 Contact with and (suspected) exposure to covid-19: Secondary | ICD-10-CM | POA: Diagnosis not present

## 2023-02-23 ENCOUNTER — Other Ambulatory Visit: Payer: Self-pay | Admitting: *Deleted

## 2023-02-23 NOTE — Patient Outreach (Signed)
Care Management   Visit Note  02/23/2023 Name: Brooke [name] MRN: [id number] DOB: 1955-10-22  Subjective: Brooke [name] is a 67 y.o. year old female who is a primary care patient of Ellamae Sia, [name], FNP. The Care Management team was consulted for assistance.      Engaged with patient spoke with patient by telephone.    Goals Addressed             This Visit's Progress    CCM (CHRONIC KIDNEY DISEASE) EXPECTED OUTCOME: MONITOR, SELF-MANAGE AND REDUCE SYMPTOMS OF CHRONIC KIDNEY DISEASE       Current Barriers:  Knowledge Deficits related to CKD Chronic Disease Management support and education needs related to CKD No Advanced Directives in place- Pt declines   Planned Interventions: Assessed the patient understanding of chronic kidney disease. Patient reports that she received a great report during her last visit with the nephrologist and her numbers were looking much better. RNCM advised to continue monitoring her blood pressure and to ensure she is remaining hydrated. Evaluation of current treatment plan related to chronic kidney disease self management and patient's adherence to plan as established by provider      Reviewed medications with patient and discussed importance of compliance. Reports compliance with all medications. Counseled on the importance of exercise goals with target of 150 minutes per week     Advised patient, providing education and rationale, to monitor blood pressure daily and record, calling PCP for findings outside established parameters. RNCM advised to start checking blood pressure daily and keeping a log and notify PCP if readings remain elevated.  Discussed complications of poorly controlled blood pressure such as heart disease, stroke, circulatory complications, vision complications, kidney impairment, sexual dysfunction    Reinforced importance of staying active Reinforced low sodium diet and nutritious food choices  Symptom  Management: Take medications as prescribed   Attend all scheduled provider appointments Call pharmacy for medication refills 3-7 days in advance of running out of medications Attend church or other social activities Perform all self care activities independently  Perform IADL's (shopping, preparing meals, housekeeping, managing finances) independently Call provider office for new concerns or questions  Try to do some type of exercise daily- walking is good Continue to check blood pressure and keep a log Drink adequate fluids, preferably water  Follow Up Plan: Telephone follow up appointment with care management team member scheduled for:   03-31-2023 at 11:45 am     CCM (DIABETES) EXPECTED OUTCOME:  MONITOR, SELF-MANAGE AND REDUCE SYMPTOMS OF DIABETES       Current Barriers:  Knowledge Deficits related to Diabetes management Chronic Disease Management support and education needs related to Diabetes Financial Constraints.  No Advanced Directives in place- pt declines   Planned Interventions: Reviewed medications with patient and discussed importance of medication adherence. Reports compliance with all medications Counseled on importance of regular laboratory monitoring as prescribed;        Advised patient, providing education and rationale, to check cbg twice daily and record call provider for findings outside established parameters. Review of fasting goal of <130 and post prandial of <180.    Review of patient status, including review of consultants reports, relevant laboratory and other test results, and medications completed;       Reinforced carbohydrate modified diet Reinforced importance of working with pharmacist and using CGM consistently. Reports compliance with Libre. Has not followed up with Endocrinology per chart review.   Symptom Management: Take medications as prescribed   Attend all scheduled  provider appointments Call pharmacy for medication refills 3-7 days in  advance of running out of medications Attend church or other social activities Perform all self care activities independently  Perform IADL's (shopping, preparing meals, housekeeping, managing finances) independently Call provider office for new concerns or questions  check blood sugar at prescribed times: twice daily check feet daily for cuts, sores or redness enter blood sugar readings and medication or insulin into daily log take the blood sugar log to all doctor visits take the blood sugar meter to all doctor visits trim toenails straight across fill half of plate with vegetables limit fast food meals to no more than 1 per week manage portion size prepare main meal at home 3 to 5 days each week read food labels for fat, fiber, carbohydrates and portion size set a realistic goal keep feet up while sitting wash and dry feet carefully every day wear comfortable, cotton socks  Follow Up Plan: Telephone follow up appointment with care management team member scheduled for:   03-31-2023 at 11:45 am       CCM (HYPERTENSION) EXPECTED OUTCOME: MONITOR, SELF-MANAGE AND REDUCE SYMPTOMS OF HYPERTENSION       Current Barriers:  Knowledge Deficits related to Hypertension management Chronic Disease Management support and education needs related to Hypertension No Advanced Directives in place- pt declines  Planned Interventions: Evaluation of current treatment plan related to hypertension self management and patient's adherence to plan as established by provider. RNCM advised to begin checking blood pressure daily and recording results and to notify provider for elevated readings. Reviewed medications with patient and discussed importance of compliance; Reports compliance with all medications Counseled on the importance of exercise goals with target of 150 minutes per week Advised patient, providing education and rationale, to monitor blood pressure daily and record, calling PCP for findings  outside established parameters;  Discussed complications of poorly controlled blood pressure such as heart disease, stroke, circulatory complications, vision complications, kidney impairment, sexual dysfunction;  Reviewed all upcoming scheduled appointments: PCP, Neurology, Bone density scan 03-29-2023 Reviewed low sodium diet  Symptom Management: Take medications as prescribed   Attend all scheduled provider appointments Call pharmacy for medication refills 3-7 days in advance of running out of medications Attend church or other social activities Perform all self care activities independently  Perform IADL's (shopping, preparing meals, housekeeping, managing finances) independently Call provider office for new concerns or questions  check blood pressure weekly choose a place to take my blood pressure (home, clinic or office, retail store) write blood pressure results in a log or diary learn about high blood pressure keep a blood pressure log take blood pressure log to all doctor appointments call doctor for signs and symptoms of high blood pressure keep all doctor appointments take medications for blood pressure exactly as prescribed begin an exercise program report new symptoms to your doctor eat more whole grains, fruits and vegetables, lean meats and healthy fats Follow low sodium diet- read food labels Limit/ avoid fast food  Follow Up Plan: Telephone follow up appointment with care management team member scheduled for: 03-31-2023 at 11:45 am          Consent to Services:  Patient was given information about care management services, agreed to services, and gave verbal consent to participate.   Plan: Telephone follow up appointment with care management team member scheduled for:03-31-2023 at 11:45 am  Danise Edge, BSN RN RN Care Manager  [facility]  Ambulatory Care Management  Direct Number: [phone]

## 2023-02-23 NOTE — Patient Instructions (Signed)
Visit Information  Thank you for taking time to visit with me today. Please don't hesitate to contact me if I can be of assistance to you before our next scheduled telephone appointment.  Following are the goals we discussed today:   Goals Addressed             This Visit's Progress    CCM (CHRONIC KIDNEY DISEASE) EXPECTED OUTCOME: MONITOR, SELF-MANAGE AND REDUCE SYMPTOMS OF CHRONIC KIDNEY DISEASE       Current Barriers:  Knowledge Deficits related to CKD Chronic Disease Management support and education needs related to CKD No Advanced Directives in place- Pt declines   Planned Interventions: Assessed the patient understanding of chronic kidney disease. Patient reports that she received a great report during her last visit with the nephrologist and her numbers were looking much better. RNCM advised to continue monitoring her blood pressure and to ensure she is remaining hydrated. Evaluation of current treatment plan related to chronic kidney disease self management and patient's adherence to plan as established by provider      Reviewed medications with patient and discussed importance of compliance. Reports compliance with all medications. Counseled on the importance of exercise goals with target of 150 minutes per week     Advised patient, providing education and rationale, to monitor blood pressure daily and record, calling PCP for findings outside established parameters. RNCM advised to start checking blood pressure daily and keeping a log and notify PCP if readings remain elevated.  Discussed complications of poorly controlled blood pressure such as heart disease, stroke, circulatory complications, vision complications, kidney impairment, sexual dysfunction    Reinforced importance of staying active Reinforced low sodium diet and nutritious food choices  Symptom Management: Take medications as prescribed   Attend all scheduled provider appointments Call pharmacy for medication  refills 3-7 days in advance of running out of medications Attend church or other social activities Perform all self care activities independently  Perform IADL's (shopping, preparing meals, housekeeping, managing finances) independently Call provider office for new concerns or questions  Try to do some type of exercise daily- walking is good Continue to check blood pressure and keep a log Drink adequate fluids, preferably water  Follow Up Plan: Telephone follow up appointment with care management team member scheduled for:   03-31-2023 at 11:45 am     CCM (DIABETES) EXPECTED OUTCOME:  MONITOR, SELF-MANAGE AND REDUCE SYMPTOMS OF DIABETES       Current Barriers:  Knowledge Deficits related to Diabetes management Chronic Disease Management support and education needs related to Diabetes Financial Constraints.  No Advanced Directives in place- pt declines   Planned Interventions: Reviewed medications with patient and discussed importance of medication adherence. Reports compliance with all medications Counseled on importance of regular laboratory monitoring as prescribed;        Advised patient, providing education and rationale, to check cbg twice daily and record call provider for findings outside established parameters. Review of fasting goal of <130 and post prandial of <180.    Review of patient status, including review of consultants reports, relevant laboratory and other test results, and medications completed;       Reinforced carbohydrate modified diet Reinforced importance of working with pharmacist and using CGM consistently. Reports compliance with Libre. Has not followed up with Endocrinology per chart review.   Symptom Management: Take medications as prescribed   Attend all scheduled provider appointments Call pharmacy for medication refills 3-7 days in advance of running out of medications Attend church  or other social activities Perform all self care activities  independently  Perform IADL's (shopping, preparing meals, housekeeping, managing finances) independently Call provider office for new concerns or questions  check blood sugar at prescribed times: twice daily check feet daily for cuts, sores or redness enter blood sugar readings and medication or insulin into daily log take the blood sugar log to all doctor visits take the blood sugar meter to all doctor visits trim toenails straight across fill half of plate with vegetables limit fast food meals to no more than 1 per week manage portion size prepare main meal at home 3 to 5 days each week read food labels for fat, fiber, carbohydrates and portion size set a realistic goal keep feet up while sitting wash and dry feet carefully every day wear comfortable, cotton socks  Follow Up Plan: Telephone follow up appointment with care management team member scheduled for:   03-31-2023 at 11:45 am       CCM (HYPERTENSION) EXPECTED OUTCOME: MONITOR, SELF-MANAGE AND REDUCE SYMPTOMS OF HYPERTENSION       Current Barriers:  Knowledge Deficits related to Hypertension management Chronic Disease Management support and education needs related to Hypertension No Advanced Directives in place- pt declines  Planned Interventions: Evaluation of current treatment plan related to hypertension self management and patient's adherence to plan as established by provider. RNCM advised to begin checking blood pressure daily and recording results and to notify provider for elevated readings. Reviewed medications with patient and discussed importance of compliance; Reports compliance with all medications Counseled on the importance of exercise goals with target of 150 minutes per week Advised patient, providing education and rationale, to monitor blood pressure daily and record, calling PCP for findings outside established parameters;  Discussed complications of poorly controlled blood pressure such as heart disease,  stroke, circulatory complications, vision complications, kidney impairment, sexual dysfunction;  Reviewed all upcoming scheduled appointments: PCP, Neurology, Bone density scan 03-29-2023 Reviewed low sodium diet  Symptom Management: Take medications as prescribed   Attend all scheduled provider appointments Call pharmacy for medication refills 3-7 days in advance of running out of medications Attend church or other social activities Perform all self care activities independently  Perform IADL's (shopping, preparing meals, housekeeping, managing finances) independently Call provider office for new concerns or questions  check blood pressure weekly choose a place to take my blood pressure (home, clinic or office, retail store) write blood pressure results in a log or diary learn about high blood pressure keep a blood pressure log take blood pressure log to all doctor appointments call doctor for signs and symptoms of high blood pressure keep all doctor appointments take medications for blood pressure exactly as prescribed begin an exercise program report new symptoms to your doctor eat more whole grains, fruits and vegetables, lean meats and healthy fats Follow low sodium diet- read food labels Limit/ avoid fast food  Follow Up Plan: Telephone follow up appointment with care management team member scheduled for: 03-31-2023 at 11:45 am          Our next appointment is by telephone on 03-31-2023 at 11:45 am  Please call the care guide team at [phone] if you need to cancel or reschedule your appointment.   If you are experiencing a Mental Health or Behavioral Health Crisis or need someone to talk to, please call the Suicide and Crisis Lifeline: 988 call the [place] National Suicide Prevention Lifeline: (302)274-4396 or TTY: 9711566835 TTY 813-700-9262) to talk to a trained counselor call 1-800-273-TALK (  toll free, 24 hour hotline)   Patient verbalizes understanding of  instructions and care plan provided today and agrees to view in MyChart. Active MyChart status and patient understanding of how to access instructions and care plan via MyChart confirmed with patient.     Telephone follow up appointment with care management team member scheduled for:03-31-2023 at 11:45 am  Danise Edge, BSN RN RN Care Manager  [facility]  Ambulatory Care Management  Direct Number: [phone]

## 2023-02-24 ENCOUNTER — Telehealth: Payer: Self-pay

## 2023-02-24 ENCOUNTER — Other Ambulatory Visit: Payer: Self-pay

## 2023-02-24 MED ORDER — ATORVASTATIN CALCIUM 80 MG PO TABS: 80.0000 mg | ORAL_TABLET | Freq: Every day | ORAL | 1 refills | Status: DC

## 2023-02-24 NOTE — Telephone Encounter (Signed)
 Refill sent to pharmacy Pt aware and verbalized understanding

## 2023-02-24 NOTE — Telephone Encounter (Signed)
Copied from CRM [id number]. Topic: Clinical - Medication Refill >> Feb 24, 2023  8:58 AM Herbert Seta B wrote: Most Recent Primary Care Visit:  Provider: Neale Burly [facility]PS  Department: [facility]  Visit Type: HOSPITAL FU  Date: 12/27/2022  Medication:  atorvastatin (LIPITOR) 80 MG tablet   Has the patient contacted their pharmacy? Yes-needs refill sent (Agent: If no, request that the patient contact the pharmacy for the refill. If patient does not wish to contact the pharmacy document the reason why and proceed with request.) (Agent: If yes, when and what did the pharmacy advise?)  Is this the correct pharmacy for this prescription? yes If no, delete pharmacy and type the correct one.  This is the patient's preferred pharmacy:   [facility] [address] [place] - [address] [place] [address] [place] [address] Phone: [phone] Fax: [phone]      Has the prescription been filled recently? yes  Is the patient out of the medication? yes  Has the patient been seen for an appointment in the last year OR does the patient have an upcoming appointment? yes  Can we respond through MyChart? yes  Agent: Please be advised that Rx refills may take up to 3 business days. We ask that you follow-up with your pharmacy.

## 2023-02-24 NOTE — Addendum Note (Signed)
Addended by: Daisy Blossom on: 02/24/2023 09:06 AM   Modules accepted: Orders

## 2023-02-27 DIAGNOSIS — N281 Cyst of kidney, acquired: Secondary | ICD-10-CM | POA: Diagnosis not present

## 2023-02-27 DIAGNOSIS — R918 Other nonspecific abnormal finding of lung field: Secondary | ICD-10-CM | POA: Diagnosis not present

## 2023-02-27 DIAGNOSIS — Z905 Acquired absence of kidney: Secondary | ICD-10-CM | POA: Diagnosis not present

## 2023-02-27 DIAGNOSIS — C641 Malignant neoplasm of right kidney, except renal pelvis: Secondary | ICD-10-CM | POA: Diagnosis not present

## 2023-03-06 ENCOUNTER — Other Ambulatory Visit (HOSPITAL_COMMUNITY): Payer: Self-pay

## 2023-03-06 ENCOUNTER — Telehealth: Payer: Self-pay

## 2023-03-06 DIAGNOSIS — N183 Chronic kidney disease, stage 3 unspecified: Secondary | ICD-10-CM | POA: Diagnosis not present

## 2023-03-06 DIAGNOSIS — Z905 Acquired absence of kidney: Secondary | ICD-10-CM | POA: Diagnosis not present

## 2023-03-06 DIAGNOSIS — C641 Malignant neoplasm of right kidney, except renal pelvis: Secondary | ICD-10-CM | POA: Diagnosis not present

## 2023-03-06 NOTE — Telephone Encounter (Signed)
Pharmacy Patient Advocate Encounter  Received notification from [facility] that Prior Authorization for FreeStyle Libre 3 Sensor has been CANCELLED due to: Authorization already on file for this request. Authorization starting on 12/06/2022 and ending on 03/06/2024.   Ran test claim. Currently a quantity of 2 is a 30 day supply and the co-pay is $27.48 .   This test claim was processed through [facility]- copay amounts may vary at other pharmacies due to pharmacy/plan contracts, or as the patient moves through the different stages of their insurance plan.

## 2023-03-06 NOTE — Telephone Encounter (Signed)
Brooke [name] (Brooke [name]) Need Help? Call us at [phone] Outcome Additional Information Required Authorization already on file for this request. Authorization starting on 12/06/2022 and ending on 03/06/2024. Drug [employer]Style [employer] 3 Sensor ePA cloud logo Form [employer] Electronic PA Form

## 2023-03-09 DIAGNOSIS — N189 Chronic kidney disease, unspecified: Secondary | ICD-10-CM | POA: Diagnosis not present

## 2023-03-09 DIAGNOSIS — R809 Proteinuria, unspecified: Secondary | ICD-10-CM | POA: Diagnosis not present

## 2023-03-09 DIAGNOSIS — D631 Anemia in chronic kidney disease: Secondary | ICD-10-CM | POA: Diagnosis not present

## 2023-03-12 ENCOUNTER — Other Ambulatory Visit: Payer: Self-pay

## 2023-03-12 ENCOUNTER — Emergency Department (HOSPITAL_COMMUNITY): Payer: Medicare Other

## 2023-03-12 ENCOUNTER — Encounter (HOSPITAL_COMMUNITY): Payer: Self-pay | Admitting: *Deleted

## 2023-03-12 ENCOUNTER — Emergency Department (HOSPITAL_COMMUNITY)
Admission: EM | Admit: 2023-03-12 | Discharge: 2023-03-12 | Disposition: A | Discharge status: Home / Self Care | Payer: Medicare Other | Attending: Emergency Medicine | Admitting: Emergency Medicine

## 2023-03-12 DIAGNOSIS — Z794 Long term (current) use of insulin: Secondary | ICD-10-CM | POA: Insufficient documentation

## 2023-03-12 DIAGNOSIS — H6123 Impacted cerumen, bilateral: Secondary | ICD-10-CM | POA: Insufficient documentation

## 2023-03-12 DIAGNOSIS — M6281 Muscle weakness (generalized): Secondary | ICD-10-CM | POA: Diagnosis present

## 2023-03-12 DIAGNOSIS — R42 Dizziness and giddiness: Secondary | ICD-10-CM | POA: Diagnosis not present

## 2023-03-12 DIAGNOSIS — Z1152 Encounter for screening for COVID-19: Secondary | ICD-10-CM | POA: Insufficient documentation

## 2023-03-12 DIAGNOSIS — E1122 Type 2 diabetes mellitus with diabetic chronic kidney disease: Secondary | ICD-10-CM | POA: Insufficient documentation

## 2023-03-12 DIAGNOSIS — Z85528 Personal history of other malignant neoplasm of kidney: Secondary | ICD-10-CM | POA: Diagnosis not present

## 2023-03-12 DIAGNOSIS — Z7982 Long term (current) use of aspirin: Secondary | ICD-10-CM | POA: Diagnosis not present

## 2023-03-12 DIAGNOSIS — I639 Cerebral infarction, unspecified: Secondary | ICD-10-CM | POA: Diagnosis not present

## 2023-03-12 DIAGNOSIS — N189 Chronic kidney disease, unspecified: Secondary | ICD-10-CM | POA: Diagnosis not present

## 2023-03-12 DIAGNOSIS — G9389 Other specified disorders of brain: Secondary | ICD-10-CM | POA: Diagnosis not present

## 2023-03-12 DIAGNOSIS — I6782 Cerebral ischemia: Secondary | ICD-10-CM | POA: Diagnosis not present

## 2023-03-12 LAB — COMPREHENSIVE METABOLIC PANEL
ALT: 16 U/L (ref 0–44)
AST: 14 U/L — ABNORMAL LOW (ref 15–41)
Albumin: 3.7 g/dL (ref 3.5–5.0)
Alkaline Phosphatase: 84 U/L (ref 38–126)
Anion gap: 8 (ref 5–15)
BUN: 22 mg/dL (ref 8–23)
CO2: 23 mmol/L (ref 22–32)
Calcium: 9.1 mg/dL (ref 8.9–10.3)
Chloride: 108 mmol/L (ref 98–111)
Creatinine, Ser: 2.1 mg/dL — ABNORMAL HIGH (ref 0.44–1.00)
GFR, Estimated: 25 mL/min — ABNORMAL LOW (ref >60)
Glucose, Bld: 125 mg/dL — ABNORMAL HIGH (ref 70–99)
Potassium: 4.7 mmol/L (ref 3.5–5.1)
Sodium: 139 mmol/L (ref 135–145)
Total Bilirubin: 0.8 mg/dL (ref 0.0–1.2)
Total Protein: 7.2 g/dL (ref 6.5–8.1)

## 2023-03-12 LAB — CBC WITH DIFFERENTIAL/PLATELET
Abs Immature Granulocytes: 0.02 10*3/uL (ref 0.00–0.07)
Basophils Absolute: 0.1 10*3/uL (ref 0.0–0.1)
Basophils Relative: 1 %
Eosinophils Absolute: 1.1 10*3/uL — ABNORMAL HIGH (ref 0.0–0.5)
Eosinophils Relative: 13 %
HCT: 39.6 % (ref 36.0–46.0)
Hemoglobin: 11.6 g/dL — ABNORMAL LOW (ref 12.0–15.0)
Immature Granulocytes: 0 %
Lymphocytes Relative: 25 %
Lymphs Abs: 2 10*3/uL (ref 0.7–4.0)
MCH: 25.6 pg — ABNORMAL LOW (ref 26.0–34.0)
MCHC: 29.3 g/dL — ABNORMAL LOW (ref 30.0–36.0)
MCV: 87.4 fL (ref 80.0–100.0)
Monocytes Absolute: 0.4 10*3/uL (ref 0.1–1.0)
Monocytes Relative: 5 %
Neutro Abs: 4.6 10*3/uL (ref 1.7–7.7)
Neutrophils Relative %: 56 %
Platelets: 362 10*3/uL (ref 150–400)
RBC: 4.53 MIL/uL (ref 3.87–5.11)
RDW: 14.8 % (ref 11.5–15.5)
WBC: 8.2 10*3/uL (ref 4.0–10.5)
nRBC: 0 % (ref 0.0–0.2)

## 2023-03-12 LAB — RESP PANEL BY RT-PCR (RSV, FLU A&B, COVID)  RVPGX2
Influenza A by PCR: NEGATIVE
Influenza B by PCR: NEGATIVE
Resp Syncytial Virus by PCR: NEGATIVE
SARS Coronavirus 2 by RT PCR: NEGATIVE

## 2023-03-12 LAB — CBG MONITORING, ED: Glucose-Capillary: 125 mg/dL — ABNORMAL HIGH (ref 70–99)

## 2023-03-12 MED ORDER — CARBAMIDE PEROXIDE 6.5 % OT SOLN: 5.0000 [drp] | Freq: Two times a day (BID) | OTIC | 0 refills | Status: DC

## 2023-03-12 MED ORDER — MECLIZINE HCL 25 MG PO TABS: 25.0000 mg | ORAL_TABLET | Freq: Three times a day (TID) | ORAL | 0 refills | Status: DC | PRN

## 2023-03-12 NOTE — ED Triage Notes (Signed)
 Pt with dizziness-pt states worse today, states she has all the time.  C/o right ear pain for past few weeks, recently pulled a tooth-thought once tooth was pulled that pain would ease off.

## 2023-03-12 NOTE — ED Provider Notes (Signed)
 [place] EMERGENCY DEPARTMENT AT [facility] Provider Note  CSN: [id number] Arrival date & time: 03/12/23 1439  Chief Complaint(s) Dizziness  HPI Brooke [name] is a 68 y.o. female history of diabetes, chronic kidney disease, obesity presenting to the emergency department with dizziness.  Patient reports that she has chronic dizziness.  Today she had an episode of worsening dizziness, now improved.  Denies any other symptoms such as visual disturbance, chest pain, shortness of breath, fevers or chills, eye pain, trouble walking, clumsiness, speech changes.  She reports that she has had some right ear fullness for couple of weeks.  No chest pain, back pain, shortness of breath, abdominal pain.  This began this morning.   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   Hypertension    Hyperuricemia 11/27/2020   Obesity (BMI 35.0-39.9 without comorbidity)    Renal mass 08/04/2021   Vertigo    Vitamin D  insufficiency 11/27/2020   Patient Active Problem List   Diagnosis Date Noted   Acute ischemic stroke (HCC) 12/14/2022   CKD (chronic kidney disease) stage 4, GFR 15-29 ml/min (HCC) 12/14/2022   Thrombocytosis 12/14/2022   Shortness of breath 12/14/2022   Chronic pain of right knee 01/26/2022   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   Thickened endometrium 03/24/2021   Vaginal bleeding 03/24/2021   Difficulty sleeping 02/10/2021   Positive colorectal cancer screening using Cologuard test 12/10/2020   Vitamin D  insufficiency 11/27/2020   Diabetic nephropathy (HCC)    CKD stage 3 due to type 2 diabetes mellitus (HCC) 03/14/2019   Type 2 diabetes mellitus with hyperglycemia,  with long-term current use of insulin  (HCC) 03/13/2019   Mixed hyperlipidemia 03/13/2019   Hypertension associated with diabetes (HCC) 03/13/2019   Obesity (BMI 30-39.9) 03/13/2019   Chronic bronchitis (HCC) 03/13/2019   Home Medication(s) Prior to Admission medications   Medication Sig Start Date End Date Taking? Authorizing Provider  acetaminophen  (TYLENOL ) 500 MG tablet Take 1,000 mg by mouth every 8 (eight) hours as needed for moderate pain.    [provider]  albuterol  (VENTOLIN  HFA) 108 (90 Base) MCG/ACT inhaler Inhale 2 puffs into the lungs every 6 (six) hours as needed for wheezing or shortness of breath. 06/26/19   Merlynn Niki FALCON, FNP  amLODipine  (NORVASC ) 10 MG tablet Take 1 tablet by mouth once daily 01/17/23   [name], Marry Lenis, FNP  Ascorbic Acid (VITA-C PO) Take 1 tablet by mouth 2 (two) times a week.    [provider]  aspirin  EC 81 MG tablet Take 1 tablet (81 mg total) by mouth daily. Swallow whole. 12/16/22   Shahmehdi, Adriana LABOR, MD  atenolol  (TENORMIN ) 100 MG tablet TAKE 1 TABLET BY MOUTH IN THE MORNING 01/17/23   [name], Marry Lenis, FNP  atorvastatin  (LIPITOR) 80 MG tablet Take 1 tablet (80 mg total) by mouth daily. 02/24/23   [name], Marry Lenis, FNP  budesonide -formoterol  (SYMBICORT ) 80-4.5 MCG/ACT inhaler Inhale 2 puffs into the lungs 2 (two) times daily. 11/10/22   Cathlene Marry Lenis, FNP  Cholecalciferol 25 MCG (1000 UT) capsule Take 1,000 Units by mouth every other day. 01/07/21   [provider]  Continuous Glucose Receiver (FREESTYLE LIBRE 3 READER) DEVI 1 Device by Does not apply route as directed. with compatible Freestyle Libre 3 sensor to monitor glucose continuously. DX:E11.65 09/01/22   [name], Marry Lenis, FNP  Continuous Glucose Sensor (FREESTYLE LIBRE 3 PLUS SENSOR) MISC Change sensor every 15 days. 12/21/22   Severa Rock HERO, FNP  Dulaglutide  (TRULICITY ) 4.5 MG/0.5ML SOPN Inject 4.5 mg as directed once a week.  05/26/22   Severa Rock HERO, FNP  Insulin  Glargine (BASAGLAR  KWIKPEN) 100 UNIT/ML Inject 37 Units into the skin daily. 09/01/22   [name], Marry Lenis, FNP  insulin  lispro (HUMALOG  KWIKPEN) 100 UNIT/ML KwikPen Inject 10-15 Units into the skin See admin instructions. 10 units WITH BREAKFAST AND LUNCH, TAKE 15 units at [facility] 09/01/22   [name], Marry Lenis, FNP  Insulin  Pen Needle 31G X 6 MM MISC Use to inject insulin  4 times daily as directed. DX: E11.65 05/26/22   Severa Rock HERO, FNP  lisinopril  (ZESTRIL ) 10 MG tablet Take 10 mg by mouth every other day. 03/22/22   [provider]  Multiple Vitamin (MULTIVITAMIN ADULT PO) Take 1 tablet by mouth every other day.    [provider]                                                                                                                                    Past Surgical History Past Surgical History:  Procedure Laterality Date   BREAST SURGERY Right 1974   OPERATIVE ULTRASOUND N/A 08/04/2021   Procedure: OPERATIVE ULTRASOUND;  Surgeon: Alvaro Hummer, MD;  Location: [facility];  Service: Urology;  Laterality: N/A;   ROBOTIC ASSITED PARTIAL NEPHRECTOMY Right 08/04/2021   Procedure: XI ROBOTIC ASSITED RADICAL NEPHRECTOMY;  Surgeon: Alvaro Hummer, MD;  Location: [facility];  Service: Urology;  Laterality: Right;   Family History Family History  Problem Relation Age of Onset   Diabetes Mother    Congestive Heart Failure Mother    Kidney disease Father    Diabetes Sister    Diabetes Brother    Hypertension Daughter    Hypertension Sister    Cervical cancer Sister     Social History Social History   Tobacco Use   Smoking status: Never   Smokeless tobacco: Never  Vaping Use   Vaping status: Never Used  Substance Use Topics   Alcohol use: No   Drug use: No   Allergies Jardiance  [empagliflozin ], Kerendia  [finerenone ], and Metformin  and related  Review of Systems Review of Systems  All other systems reviewed and are  negative.   Physical Exam Vital Signs  I have reviewed the triage vital signs BP (!) 169/86 (BP Location: Right Arm)   Pulse 78   Temp 98.5 F (36.9 C) (Oral)   Resp 18   Ht 5' 5 (1.651 m)   Wt 99.3 kg   LMP 03/16/2012   SpO2 97%   BMI 36.44 kg/m  Physical Exam Vitals and  nursing note reviewed.  Constitutional:      General: She is not in acute distress.    Appearance: She is well-developed.  HENT:     Head: Normocephalic and atraumatic.     Right Ear: Ear canal and external ear normal. There is impacted cerumen.     Left Ear: Ear canal and external ear normal. There is impacted cerumen.     Mouth/Throat:     Mouth: Mucous membranes are moist.  Eyes:     Pupils: Pupils are equal, round, and reactive to light.  Cardiovascular:     Rate and Rhythm: Normal rate and regular rhythm.     Heart sounds: No murmur heard. Pulmonary:     Effort: Pulmonary effort is normal. No respiratory distress.     Breath sounds: Normal breath sounds.  Abdominal:     General: Abdomen is flat.     Palpations: Abdomen is soft.     Tenderness: There is no abdominal tenderness.  Musculoskeletal:        General: No tenderness.     Right lower leg: No edema.     Left lower leg: No edema.  Skin:    General: Skin is warm and dry.  Neurological:     General: No focal deficit present.     Mental Status: She is alert. Mental status is at baseline.     Comments: Cranial nerves II through XII intact, strength 5 out of 5 in the bilateral upper and lower extremities, no sensory deficit to light touch, no dysmetria on finger-nose-finger testing, ambulatory with steady gait, no dysdiadochokinesia, normal heel/shin testing.  Psychiatric:        Mood and Affect: Mood normal.        Behavior: Behavior normal.     ED Results and Treatments Labs (all labs ordered are listed, but only abnormal results are displayed) Labs Reviewed  CBC WITH DIFFERENTIAL/PLATELET - Abnormal; Notable for the following  components:      Result Value   Hemoglobin 11.6 (*)    MCH 25.6 (*)    MCHC 29.3 (*)    Eosinophils Absolute 1.1 (*)    All other components within normal limits  COMPREHENSIVE METABOLIC PANEL - Abnormal; Notable for the following components:   Glucose, Bld 125 (*)    Creatinine, Ser 2.10 (*)    AST 14 (*)    GFR, Estimated 25 (*)    All other components within normal limits  CBG MONITORING, ED - Abnormal; Notable for the following components:   Glucose-Capillary 125 (*)    All other components within normal limits  RESP PANEL BY RT-PCR (RSV, FLU A&B, COVID)  RVPGX2                                                                                                                          Radiology CT Head Wo Contrast Result Date: 03/12/2023 CLINICAL DATA:  Mental status change, unknown cause. Dizziness. Right ear pain. EXAM: CT  HEAD WITHOUT CONTRAST TECHNIQUE: Contiguous axial images were obtained from the base of the skull through the vertex without intravenous contrast. RADIATION DOSE REDUCTION: This exam was performed according to the departmental dose-optimization program which includes automated exposure control, adjustment of the mA and/or kV according to patient size and/or use of iterative reconstruction technique. COMPARISON:  Head CT 12/13/2022 and MRI 12/14/2022 FINDINGS: Brain: There is no evidence of an acute infarct, intracranial hemorrhage, mass, midline shift, or extra-axial fluid collection. 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 There has been expected interval evolution of the small left PCA infarct with development of encephalomalacia. Hypodensities elsewhere in the cerebral white matter are similar to the prior CT and are nonspecific but compatible with mild chronic small vessel ischemic disease. The ventricles are normal in size. Vascular: Calcified atherosclerosis at the skull base. No hyperdense vessel. Skull: No acute fracture or suspicious  osseous lesion. Sinuses/Orbits: Mild mucosal thickening in the paranasal sinuses. Small amount of secretions in the maxillary sinuses. Clear mastoid air cells. Chronic bilateral proptosis. Other: None. IMPRESSION: 1. No evidence of acute intracranial abnormality. 2. 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 Electronically Signed   By: Dasie Hamburg M.D.   On: 03/12/2023 16:24    Pertinent labs & imaging results that were available during my care of the patient were reviewed by me and considered in my medical decision making (see MDM for details).  Medications Ordered in ED Medications - No data to display                                                                                                                                   Procedures Procedures  (including critical care time)  Medical Decision Making / ED Course   MDM:  68 year old presenting to the emergency department with dizziness.  She reports history of chronic dizziness, reports that her dizziness was slightly worse today.  Low concern for acute stroke.  Her neurologic exam is reassuring.  Symptoms most consistent with peripheral vertigo.  Seems less consistent with a lightheadedness type sensation.  Labs obtained and are reassuring.  Given her normal neurologic exam at this time, low concern for acute event needing MRI or further evaluation.  He did report some ear fullness, has some impacted wax bilaterally but external ear and canals are normal.  Will give prescription of Debrox and instructed to follow-up with primary doctor.  Will discharge patient to home. All questions answered. Patient comfortable with plan of discharge. Return precautions discussed with patient and specified on the after visit summary.       Additional history obtained: -External records from outside source obtained and reviewed including: Chart review including previous notes, labs, imaging, consultation notes including prior ER  notes    Lab Tests: -I ordered, reviewed, and interpreted labs.   The pertinent results include:   Labs Reviewed  CBC WITH DIFFERENTIAL/PLATELET - Abnormal;  Notable for the following components:      Result Value   Hemoglobin 11.6 (*)    MCH 25.6 (*)    MCHC 29.3 (*)    Eosinophils Absolute 1.1 (*)    All other components within normal limits  COMPREHENSIVE METABOLIC PANEL - Abnormal; Notable for the following components:   Glucose, Bld 125 (*)    Creatinine, Ser 2.10 (*)    AST 14 (*)    GFR, Estimated 25 (*)    All other components within normal limits  CBG MONITORING, ED - Abnormal; Notable for the following components:   Glucose-Capillary 125 (*)    All other components within normal limits  RESP PANEL BY RT-PCR (RSV, FLU A&B, COVID)  RVPGX2    Notable for mild anemia, stable CKD   EKG   EKG Interpretation Date/Time:  Sunday March 12 2023 16:40:05 EST Ventricular Rate:  71 PR Interval:  218 QRS Duration:  78 QT Interval:  362 QTC Calculation: 393 R Axis:   59  Text Interpretation: Sinus rhythm with 1st degree A-V block Nonspecific T wave abnormality Abnormal ECG Confirmed by Francesca Fallow ([id number]) on 03/12/2023 6:11:47 PM         Imaging Studies ordered: I ordered imaging studies including CT head On my interpretation imaging demonstrates no acute process I independently visualized and interpreted imaging. I agree with the radiologist interpretation   Medicines ordered and prescription drug management: No orders of the defined types were placed in this encounter.   -I have reviewed the patients home medicines and have made adjustments as needed  Social Determinants of Health:  Diagnosis or treatment significantly limited by social determinants of health: obesity   Reevaluation: After the interventions noted above, I reevaluated the patient and found that their symptoms have improved  Co morbidities that complicate the patient evaluation  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   Hypertension    Hyperuricemia 11/27/2020   Obesity (BMI 35.0-39.9 without comorbidity)    Renal mass 08/04/2021   Vertigo    Vitamin D  insufficiency 11/27/2020      Dispostion: Disposition decision including need for hospitalization was considered, and patient discharged from emergency department.    Final Clinical Impression(s) / ED Diagnoses Final diagnoses:  Dizziness     This chart was dictated using voice recognition software.  Despite best efforts to proofread,  errors can occur which can change the documentation meaning.    Francesca Fallow CROME, MD 03/12/23 [phone]

## 2023-03-12 NOTE — Discharge Instructions (Addendum)
 We evaluated you for your dizziness.  Your neurologic examination and testing was reassuring.  We do not think you have had a stroke.  We have prescribed you medication to help take if you develop any recurrent dizziness.  Your ear symptoms could be due to too much earwax, so we have prescribed you drops to help with this.  Please follow-up with your primary doctor so they can look at your ear once the wax is dissolved.  Please return to the emergency department if you have any new symptoms such as weakness in your arms, trouble walking, facial droop, vision changes, trouble with coordination, severe worsening symptoms, numbness or tingling, or any other concerning symptoms.

## 2023-03-12 NOTE — ED Provider Triage Note (Addendum)
 Emergency Medicine Provider Triage Evaluation Note  Brooke [name] , a 68 y.o. female  was evaluated in triage.  Pt complains of dizziness.  Review of Systems  Positive:  Negative:   Physical Exam  BP (!) 169/86 (BP Location: Right Arm)   Pulse 78   Temp 98.5 F (36.9 C) (Oral)   Resp 18   Ht 5' 5 (1.651 m)   Wt 99.3 kg   LMP 03/16/2012   SpO2 97%   BMI 36.44 kg/m  Gen:   Awake, no distress   Resp:  Normal effort  MSK:   Moves extremities without difficulty  Other:    Medical Decision Making  Medically screening exam initiated at 3:47 PM.  Appropriate orders placed.  Tezra Mahr was informed that the remainder of the evaluation will be completed by another provider, this initial triage assessment does not replace that evaluation, and the importance of remaining in the ED until their evaluation is complete.  Patient stating that she is always dizzy, but the dizziness is worse today. Dizziness gets worse with head movement. Does not take medicine for the dizziness. Dizziness worsened around 12:30PM today.  Also concerned for right sided otalgia x3 weeks. Patient initially thought this otalgia was d/t a tooth, this tooth was recently extracted but patient is still having the pain. Also with cough x1 month - no coughing in triage. Patient also felt nauseated today. Patient now stating that her dizziness feels better than what it was feeling like earlier.  Denies fever, chest pain, dyspnea, vomiting, diarrhea, dysuria, hematuria, hematochezia. Denies changes in vision, diplopia, head trauma, LOC, seizures, blood thinners.   GCS 15. Speech is goal oriented. No deficits appreciated to CN III-XII; symmetric eyebrow raise, no facial drooping, tongue midline. Patient has equal grip strength bilaterally with 5/5 strength against resistance in all major muscle groups bilaterally. Sensation to light touch intact. Patient moves extremities without ataxia. Patient ambulatory with steady  gait. No nystagmus.      Hoy Nidia FALCON, [place] 03/12/23 [phone]

## 2023-03-20 ENCOUNTER — Telehealth: Payer: Self-pay

## 2023-03-20 NOTE — Progress Notes (Signed)
 Transition Care Management Unsuccessful Follow-up Telephone Call  Date of discharge and from where:  03/12/2023 [facility]  Attempts:  1st Attempt  Reason for unsuccessful TCM follow-up call:  No answer/busy  Sharita Bienaime [facility]  [facility], [facility] Resource Care Guide Direct Dial : [phone]  Website: [email or web address]

## 2023-03-21 ENCOUNTER — Telehealth: Payer: Self-pay

## 2023-03-21 NOTE — Progress Notes (Signed)
 Transition Care Management Unsuccessful Follow-up Telephone Call  Date of discharge and from where:  03/12/2023 [facility]  Attempts:  2nd Attempt  Reason for unsuccessful TCM follow-up call:  No answer/busy  Jaelynne Hockley [facility]  [facility], [facility] Resource Care Guide Direct Dial : [phone]  Website: [email or web address]

## 2023-03-22 ENCOUNTER — Ambulatory Visit (INDEPENDENT_AMBULATORY_CARE_PROVIDER_SITE_OTHER): Payer: Medicare Other | Admitting: Family Medicine

## 2023-03-22 ENCOUNTER — Encounter: Payer: Self-pay | Admitting: Family Medicine

## 2023-03-22 VITALS — BP 148/80 | HR 75 | Temp 98.3°F | Ht 65.0 in | Wt 218.0 lb

## 2023-03-22 DIAGNOSIS — J441 Chronic obstructive pulmonary disease with (acute) exacerbation: Secondary | ICD-10-CM | POA: Diagnosis not present

## 2023-03-22 DIAGNOSIS — Z5986 Financial insecurity: Secondary | ICD-10-CM

## 2023-03-22 DIAGNOSIS — H6123 Impacted cerumen, bilateral: Secondary | ICD-10-CM

## 2023-03-22 DIAGNOSIS — J410 Simple chronic bronchitis: Secondary | ICD-10-CM

## 2023-03-22 MED ORDER — IPRATROPIUM-ALBUTEROL 0.5-2.5 (3) MG/3ML IN SOLN
3.0000 mL | Freq: Once | RESPIRATORY_TRACT | Status: AC
Start: 2023-03-22 — End: 2023-03-22
  Administered 2023-03-22: 3 mL via RESPIRATORY_TRACT

## 2023-03-22 MED ORDER — PREDNISONE 20 MG PO TABS
40.0000 mg | ORAL_TABLET | Freq: Every day | ORAL | 0 refills | Status: AC
Start: 2023-03-22 — End: 2023-03-27

## 2023-03-22 MED ORDER — BREZTRI AEROSPHERE 160-9-4.8 MCG/ACT IN AERO
2.0000 | INHALATION_SPRAY | Freq: Two times a day (BID) | RESPIRATORY_TRACT | 11 refills | Status: DC
Start: 2023-03-22 — End: 2023-03-23

## 2023-03-22 MED ORDER — ALBUTEROL SULFATE (2.5 MG/3ML) 0.083% IN NEBU
2.5000 mg | INHALATION_SOLUTION | Freq: Four times a day (QID) | RESPIRATORY_TRACT | 1 refills | Status: DC | PRN
Start: 2023-03-22 — End: 2023-03-23

## 2023-03-22 NOTE — Progress Notes (Signed)
 Subjective:  Patient ID: Brooke [name], female    DOB: May 08, 1955, 68 y.o.   MRN: [id number]  Patient Care Team: Chrystine Crate, FNP as PCP - General (Family Medicine) Delilah Fend, [facility] (Pharmacist) Alexia Idler, [place] (Optometry) Candi Chafe, Pearletha Bouche, MD as Consulting Physician (Ophthalmology) Remona Carmel, RN as [facility] Care Management (General Practice)   Chief Complaint:  Respiratory Distress (X 1 month/Right ear/jaw pain/Dizziness has resolved/)  HPI: Brooke [name] is a 68 y.o. female presenting on 03/22/2023 for Respiratory Distress (X 1 month/Right ear/jaw pain/Dizziness has resolved/)  States "I cannot breathe". She went to UC one month ago and was prescribe a steroid injection and abx (doxycycline). She completed course of doxy. States that she felt better the next day. States that breathing worsened again 1.5 weeks ago. States that she heats her house with kerosene and wonders if it is not venting well. States that she is sleeping on multiple pillows, she is waking up at night short of breath and cannot walk as far as she used to without getting short of breath. She is using albuterol  every hour or two the last 4 days. She is waking up at night and using it. States that she is not taking her symbicort  because she lost financial assistance. States that she is coughing with clear phlegm. States that she does not have fever or rhinorrhea. States that her right ear throbs, has pain in her jaw, and  she is not able to open her mouth all the way due to the pain. She was prescribed debrox at ED. She states that she did not pick it up due to cost.   Relevant past medical, surgical, family, and social history reviewed and updated as indicated.  Allergies and medications reviewed and updated. Data reviewed: Chart in Epic.  Past Medical History:  Diagnosis Date   Anemia    Arthritis    Bronchitis    frequent bronchitis   Diabetes mellitus    Diabetic nephropathy  (HCC)    Hepatic steatosis    History of renal cell carcinoma 08/04/2021   Clear cell, nuclear grade 4, size 5.3 cm, S/P nephrectomy   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   Hypertension    Hyperuricemia 11/27/2020   Obesity (BMI 35.0-39.9 without comorbidity)    Renal mass 08/04/2021   Vertigo    Vitamin D  insufficiency 11/27/2020    Past Surgical History:  Procedure Laterality Date   BREAST SURGERY Right 1974   OPERATIVE ULTRASOUND N/A 08/04/2021   Procedure: OPERATIVE ULTRASOUND;  Surgeon: Osborn Blaze, MD;  Location: [facility];  Service: Urology;  Laterality: N/A;   ROBOTIC ASSITED PARTIAL NEPHRECTOMY Right 08/04/2021   Procedure: XI ROBOTIC ASSITED RADICAL NEPHRECTOMY;  Surgeon: Osborn Blaze, MD;  Location: [facility];  Service: Urology;  Laterality: Right;    Social History   Socioeconomic History   Marital status: Married    Spouse name: Donnie   Number of children: 1   Years of education: Not on file   Highest education level: Not on file  Occupational History   Not on file  Tobacco Use   Smoking status: Never   Smokeless tobacco: Never  Vaping Use   Vaping status: Never Used  Substance and Sexual Activity   Alcohol use: No   Drug use: No   Sexual activity: Never  Other Topics Concern   Not on file  Social History Narrative   1 daughter    1 grandson   Social Drivers  of Health   Financial Resource 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  (02/23/2023)   Overall Financial Resource Strain (CARDIA)    Difficulty of Paying Living Expenses: Not hard at all  Food Insecurity: No Food Insecurity (12/14/2022)   Hunger Vital Sign    Worried About Running Out of Food in the Last Year: Never true    Ran Out of Food in the Last Year: Never true  Transportation Needs: No Transportation Needs (12/14/2022)   PRAPARE - Administrator, Civil Service (Medical): No    Lack of Transportation (Non-Medical): No  Physical Activity: Inactive (02/23/2023)   Exercise Vital Sign    Days of  Exercise per Week: 0 days    Minutes of Exercise per Session: 0 min  Stress: No Stress Concern Present (04/25/2022)   [employer] of Occupational Health - Occupational Stress Questionnaire    Feeling of Stress : Not at all  Social Connections: Moderately Integrated (04/25/2022)   Social Connection and Isolation Panel [NHANES]    Frequency of Communication with Friends and Family: More than three times a week    Frequency of Social Gatherings with Friends and Family: More than three times a week    Attends Religious Services: More than 4 times per year    Active Member of [employer] or Organizations: No    Attends Banker Meetings: Never    Marital Status: Married  Catering manager Violence: Not At Risk (12/14/2022)   Humiliation, Afraid, Rape, and Kick questionnaire    Fear of Current or Ex-Partner: No    Emotionally Abused: No    Physically Abused: No    Sexually Abused: No    Outpatient Encounter Medications as of 03/22/2023  Medication Sig   acetaminophen  (TYLENOL ) 500 MG tablet Take 1,000 mg by mouth every 8 (eight) hours as needed for moderate pain.   albuterol  (VENTOLIN  HFA) 108 (90 Base) MCG/ACT inhaler Inhale 2 puffs into the lungs every 6 (six) hours as needed for wheezing or shortness of breath.   amLODipine  (NORVASC ) 10 MG tablet Take 1 tablet by mouth once daily   Ascorbic Acid (VITA-C PO) Take 1 tablet by mouth 2 (two) times a week.   aspirin  EC 81 MG tablet Take 1 tablet (81 mg total) by mouth daily. Swallow whole.   atenolol  (TENORMIN ) 100 MG tablet TAKE 1 TABLET BY MOUTH IN THE MORNING   atorvastatin  (LIPITOR) 80 MG tablet Take 1 tablet (80 mg total) by mouth daily.   budesonide -formoterol  (SYMBICORT ) 80-4.5 MCG/ACT inhaler Inhale 2 puffs into the lungs 2 (two) times daily.   carbamide peroxide (DEBROX) 6.5 % OTIC solution Place 5 drops into both ears 2 (two) times daily.   Cholecalciferol 25 MCG (1000 UT) capsule Take 1,000 Units by mouth every other day.    Continuous Glucose Receiver (FREESTYLE LIBRE 3 READER) DEVI 1 Device by Does not apply route as directed. with compatible Freestyle Libre 3 sensor to monitor glucose continuously. DX:E11.65   Continuous Glucose Sensor (FREESTYLE LIBRE 3 PLUS SENSOR) MISC Change sensor every 15 days.   Dulaglutide  (TRULICITY ) 4.5 MG/0.5ML SOPN Inject 4.5 mg as directed once a week.   Insulin  Glargine (BASAGLAR  KWIKPEN) 100 UNIT/ML Inject 37 Units into the skin daily.   insulin  lispro (HUMALOG  KWIKPEN) 100 UNIT/ML KwikPen Inject 10-15 Units into the skin See admin instructions. 10 units WITH BREAKFAST AND LUNCH, TAKE 15 units at [place] [facility]   Insulin  Pen Needle 31G X 6 MM MISC Use to inject insulin  4 times daily as  directed. DX: E11.65   lisinopril  (ZESTRIL ) 10 MG tablet Take 10 mg by mouth every other day.   meclizine  (ANTIVERT ) 25 MG tablet Take 1 tablet (25 mg total) by mouth 3 (three) times daily as needed for dizziness.   Multiple Vitamin (MULTIVITAMIN ADULT PO) Take 1 tablet by mouth every other day.   promethazine-dextromethorphan (PROMETHAZINE-DM) 6.25-15 MG/5ML syrup Take 5 mLs by mouth 4 (four) times daily as needed.   No facility-administered encounter medications on file as of 03/22/2023.    Allergies  Allergen Reactions   Jardiance  [Empagliflozin ]     Holding per nephro, pt reports no allergy, Dr just didn't want pt taking it   Kerendia  [Finerenone ]     Holding per nephro, pt reports no allergy, Dr just didn't want pt taking it   Metformin  And Related     Contraindicated due to GFR; GI side effects Pt states not a real allergy    Review of Systems As per HPI  Objective:  BP (!) 148/80   Pulse 75   Temp 98.3 F (36.8 C)   Ht 5\' 5"  (1.651 m)   Wt 218 lb (98.9 kg)   LMP 03/16/2012   SpO2 97%   BMI 36.28 kg/m    Wt Readings from Last 3 Encounters:  03/22/23 218 lb (98.9 kg)  03/12/23 219 lb (99.3 kg)  12/27/22 218 lb (98.9 kg)    Physical Exam Constitutional:      General: She is  awake. She is not in acute distress.    Appearance: Normal appearance. She is well-developed and well-groomed. She is obese. She is not ill-appearing, toxic-appearing or diaphoretic.  HENT:     Right Ear: There is impacted cerumen.     Left Ear: There is impacted cerumen.  Cardiovascular:     Rate and Rhythm: Normal rate and regular rhythm.     Pulses: Normal pulses.          Radial pulses are 2+ on the right side and 2+ on the left side.       Posterior tibial pulses are 2+ on the right side and 2+ on the left side.     Heart sounds: Normal heart sounds. No murmur heard.    No gallop.  Pulmonary:     Effort: Pulmonary effort is normal. No respiratory distress.     Breath sounds: No stridor. Examination of the right-upper field reveals wheezing. Examination of the left-upper field reveals wheezing. Examination of the right-middle field reveals wheezing. Examination of the left-middle field reveals wheezing. Examination of the right-lower field reveals wheezing. Examination of the left-lower field reveals wheezing. Wheezing present. No decreased breath sounds, rhonchi or rales.     Comments: Significant inspiratory and expiratory wheeze all lung fields including anterior  Musculoskeletal:     Cervical back: Full passive range of motion without pain and neck supple.     Right lower leg: No edema.     Left lower leg: No edema.  Lymphadenopathy:     Head:     Right side of head: No submental, submandibular, tonsillar, preauricular or posterior auricular adenopathy.     Left side of head: No submental, submandibular, tonsillar, preauricular or posterior auricular adenopathy.     Cervical:     Right cervical: No superficial cervical adenopathy.    Left cervical: No superficial cervical adenopathy.  Skin:    General: Skin is warm.     Capillary Refill: Capillary refill takes less than 2 seconds.  Neurological:  General: No focal deficit present.     Mental Status: She is alert, oriented  to person, place, and time and easily aroused. Mental status is at baseline.     GCS: GCS eye subscore is 4. GCS verbal subscore is 5. GCS motor subscore is 6.     Motor: No weakness.  Psychiatric:        Attention and Perception: Attention and perception normal.        Mood and Affect: Mood and affect normal.        Speech: Speech normal.        Behavior: Behavior normal. Behavior is cooperative.        Thought Content: Thought content normal. Thought content does not include homicidal or suicidal ideation. Thought content does not include homicidal or suicidal plan.        Cognition and Memory: Cognition and memory normal.        Judgment: Judgment normal.    Results for orders placed or performed during the hospital encounter of 03/12/23  CBC with Differential   Collection Time: 03/12/23  4:04 PM  Result Value Ref Range   WBC 8.2 4.0 - 10.5 K/uL   RBC 4.53 3.87 - 5.11 MIL/uL   Hemoglobin 11.6 (L) 12.0 - 15.0 g/dL   HCT 16.1 09.6 - 04.5 %   MCV 87.4 80.0 - 100.0 fL   MCH 25.6 (L) 26.0 - 34.0 pg   MCHC 29.3 (L) 30.0 - 36.0 g/dL   RDW 40.9 81.1 - 91.4 %   Platelets 362 150 - 400 K/uL   nRBC 0.0 0.0 - 0.2 %   Neutrophils Relative % 56 %   Neutro Abs 4.6 1.7 - 7.7 K/uL   Lymphocytes Relative 25 %   Lymphs Abs 2.0 0.7 - 4.0 K/uL   Monocytes Relative 5 %   Monocytes Absolute 0.4 0.1 - 1.0 K/uL   Eosinophils Relative 13 %   Eosinophils Absolute 1.1 (H) 0.0 - 0.5 K/uL   Basophils Relative 1 %   Basophils Absolute 0.1 0.0 - 0.1 K/uL   Immature Granulocytes 0 %   Abs Immature Granulocytes 0.02 0.00 - 0.07 K/uL  Comprehensive metabolic panel   Collection Time: 03/12/23  4:04 PM  Result Value Ref Range   Sodium 139 135 - 145 mmol/L   Potassium 4.7 3.5 - 5.1 mmol/L   Chloride 108 98 - 111 mmol/L   CO2 23 22 - 32 mmol/L   Glucose, Bld 125 (H) 70 - 99 mg/dL   BUN 22 8 - 23 mg/dL   Creatinine, Ser 7.82 (H) 0.44 - 1.00 mg/dL   Calcium  9.1 8.9 - 10.3 mg/dL   Total Protein 7.2 6.5 -  8.1 g/dL   Albumin 3.7 3.5 - 5.0 g/dL   AST 14 (L) 15 - 41 U/L   ALT 16 0 - 44 U/L   Alkaline Phosphatase 84 38 - 126 U/L   Total Bilirubin 0.8 0.0 - 1.2 mg/dL   GFR, Estimated 25 (L) >60 mL/min   Anion gap 8 5 - 15  POC CBG, ED   Collection Time: 03/12/23  4:38 PM  Result Value Ref Range   Glucose-Capillary 125 (H) 70 - 99 mg/dL  Resp panel by RT-PCR (RSV, Flu A&B, Covid) Anterior Nasal Swab   Collection Time: 03/12/23  4:39 PM   Specimen: Anterior Nasal Swab  Result Value Ref Range   SARS Coronavirus 2 by RT PCR NEGATIVE NEGATIVE   Influenza A by PCR NEGATIVE NEGATIVE  Influenza B by PCR NEGATIVE NEGATIVE   Resp Syncytial Virus by PCR NEGATIVE NEGATIVE       03/22/2023   11:43 AM 12/27/2022    3:28 PM 11/10/2022    1:42 PM 10/25/2022   11:03 AM 07/21/2022    1:35 PM  Depression screen PHQ 2/9  Decreased Interest 0 0 0 0 0  Down, Depressed, Hopeless 0 0 0 0 0  PHQ - 2 Score 0 0 0 0 0  Altered sleeping 0 0 0 0 0  Tired, decreased energy 0 0 0 0 0  Change in appetite 0 0 0 0 0  Feeling bad or failure about yourself   0 0 0 0  Trouble concentrating 0 0 0 0 0  Moving slowly or fidgety/restless 0 0 0 0 0  Suicidal thoughts 0 0 0 0 0  PHQ-9 Score 0 0 0 0 0  Difficult doing work/chores  Not difficult at all Not difficult at all Not difficult at all Not difficult at all       12/27/2022    3:28 PM 11/10/2022    1:42 PM 10/25/2022   11:03 AM 07/21/2022    1:36 PM  GAD 7 : Generalized Anxiety Score  Nervous, Anxious, on Edge 0 0 0 0  Control/stop worrying 0 0 0 0  Worry too much - different things 0 0 0 0  Trouble relaxing 0 0 0 0  Restless 0 0 0 0  Easily annoyed or irritable 0 0 0 0  Afraid - awful might happen 0 0 0 0  Total GAD 7 Score 0 0 0 0  Anxiety Difficulty  Not difficult at all Not difficult at all Not difficult at all   Pertinent labs & imaging results that were available during my care of the patient were reviewed by me and considered in my medical decision  making.  Assessment & Plan:  Lizzete was seen today for respiratory distress.  Diagnoses and all orders for this visit: 1. Simple chronic bronchitis (HCC) (Primary) Will start medication as below. Provided duoneb in office with minimal improvement in wheezing. Patient not able to afford symbicort . Provided patient with breztri  and offered copay card. Referral placed as below for financial strain. Encouraged patient to follow up in one week to monitor. She has not established with pulmonology. Provided contact information for patient. Will provide steroid burst as below. Discussed with patient to use neb during exacerbation and then she may return to inhaler as needed.  - Budeson-Glycopyrrol-Formoterol  (BREZTRI  AEROSPHERE) 160-9-4.8 MCG/ACT AERO; Inhale 2 puffs into the lungs 2 (two) times daily.  Dispense: 10.7 g; Refill: 11 - predniSONE  (DELTASONE ) 20 MG tablet; Take 2 tablets (40 mg total) by mouth daily with breakfast for 5 days.  Dispense: 10 tablet; Refill: 0 - AMB Referral [facility] Care Management - ipratropium-albuterol  (DUONEB) 0.5-2.5 (3) MG/3ML nebulizer solution 3 mL - albuterol  (PROVENTIL ) (2.5 MG/3ML) 0.083% nebulizer solution; Take 3 mLs (2.5 mg total) by nebulization every 6 (six) hours as needed for wheezing or shortness of breath.  Dispense: 150 mL; Refill: 1 - For home use only DME Nebulizer machine  2. COPD with exacerbation (HCC) As above  - Budeson-Glycopyrrol-Formoterol  (BREZTRI  AEROSPHERE) 160-9-4.8 MCG/ACT AERO; Inhale 2 puffs into the lungs 2 (two) times daily.  Dispense: 10.7 g; Refill: 11 - predniSONE  (DELTASONE ) 20 MG tablet; Take 2 tablets (40 mg total) by mouth daily with breakfast for 5 days.  Dispense: 10 tablet; Refill: 0 - AMB Referral [facility] Care Management -  ipratropium-albuterol  (DUONEB) 0.5-2.5 (3) MG/3ML nebulizer solution 3 mL - albuterol  (PROVENTIL ) (2.5 MG/3ML) 0.083% nebulizer solution; Take 3 mLs (2.5 mg total) by nebulization every 6 (six) hours as needed  for wheezing or shortness of breath.  Dispense: 150 mL; Refill: 1 - For home use only DME Nebulizer machine  3. Financial insecurity As above.  - AMB Referral [facility] Care Management  4. Impacted cerumen, bilateral Encouraged patient to start Debrox and will plan for lavage at next appt.   Continue all other maintenance medications.  Follow up plan: Return for appt on 1/22.  Continue healthy lifestyle choices, including diet (rich in fruits, vegetables, and lean proteins, and low in salt and simple carbohydrates) and exercise (at least 30 minutes of moderate physical activity daily).  Written and verbal instructions provided   The above assessment and management plan was discussed with the patient. The patient verbalized understanding of and has agreed to the management plan. Patient is aware to call the clinic if they develop any new symptoms or if symptoms persist or worsen. Patient is aware when to return to the clinic for a follow-up visit. Patient educated on when it is appropriate to go to the emergency department.   Jacqualyn Mates, DNP-FNP [facility] [address] [place], [place] [address] [phone]

## 2023-03-22 NOTE — Patient Instructions (Addendum)
 [facility] at [facility]. [address], [address] [place] [address] [phone]  Debrox is available Over the counter, please start using and we can lavage your ears at the next appt.

## 2023-03-23 ENCOUNTER — Telehealth: Payer: Self-pay | Admitting: Family Medicine

## 2023-03-23 ENCOUNTER — Ambulatory Visit: Payer: Self-pay | Admitting: Family Medicine

## 2023-03-23 DIAGNOSIS — J410 Simple chronic bronchitis: Secondary | ICD-10-CM

## 2023-03-23 DIAGNOSIS — J441 Chronic obstructive pulmonary disease with (acute) exacerbation: Secondary | ICD-10-CM

## 2023-03-23 MED ORDER — TRELEGY ELLIPTA 100-62.5-25 MCG/ACT IN AEPB: 1.0000 | INHALATION_SPRAY | Freq: Every day | RESPIRATORY_TRACT | 11 refills | Status: DC

## 2023-03-23 MED ORDER — ALBUTEROL SULFATE (2.5 MG/3ML) 0.083% IN NEBU: 2.5000 mg | INHALATION_SOLUTION | Freq: Four times a day (QID) | RESPIRATORY_TRACT | 1 refills | Status: AC | PRN

## 2023-03-23 NOTE — Telephone Encounter (Signed)
Patient requesting that nebulizer prescription be sent to [facility] [address] [place], [place] [address] Phone: [phone]  Fax: [phone]   Reason for Disposition  General information question, no triage required and triager able to answer question  Answer Assessment - Initial Assessment Questions 1. REASON FOR CALL or QUESTION: "What is your reason for calling today?" or "How can I best help you?" or "What question do you have that I can help answer?"     Patient wanted to have nebulizer prescription sent to a specific pharmacy  Protocols used: Information Only Call - No Triage-A-AH

## 2023-03-23 NOTE — Telephone Encounter (Signed)
Patient aware.

## 2023-03-23 NOTE — Telephone Encounter (Signed)
Copied from CRM [id number]. Topic: Clinical - Prescription Issue >> Mar 23, 2023 11:36 AM Victorino Dike T wrote: Reason for CRM: Budeson-Glycopyrrol-Formoterol (BREZTRI AEROSPHERE) 160-9-4.8 MCG/ACT AERO costs too much and not able to get it even with the discount card, need something more affordable and also waiting on prescription for nebulizer from [facility], please call patient [phone]

## 2023-03-23 NOTE — Telephone Encounter (Signed)
Copied from CRM [id number]. Topic: Clinical - Prescription Issue >> Mar 23, 2023 11:32 AM Gaetano Hawthorne wrote: Reason for CRM: Patient would like to speak with Vanice Sarah regarding the program that she has been receiving her symbicort in[place] through.

## 2023-03-25 DIAGNOSIS — I129 Hypertensive chronic kidney disease with stage 1 through stage 4 chronic kidney disease, or unspecified chronic kidney disease: Secondary | ICD-10-CM | POA: Diagnosis not present

## 2023-03-25 DIAGNOSIS — N184 Chronic kidney disease, stage 4 (severe): Secondary | ICD-10-CM | POA: Diagnosis not present

## 2023-03-25 DIAGNOSIS — E1129 Type 2 diabetes mellitus with other diabetic kidney complication: Secondary | ICD-10-CM | POA: Diagnosis not present

## 2023-03-25 DIAGNOSIS — R809 Proteinuria, unspecified: Secondary | ICD-10-CM | POA: Diagnosis not present

## 2023-03-29 ENCOUNTER — Encounter: Payer: Self-pay | Admitting: Family Medicine

## 2023-03-29 ENCOUNTER — Ambulatory Visit: Payer: Medicare Other | Admitting: Family Medicine

## 2023-03-29 ENCOUNTER — Other Ambulatory Visit: Payer: Self-pay | Admitting: Family Medicine

## 2023-03-29 ENCOUNTER — Other Ambulatory Visit (INDEPENDENT_AMBULATORY_CARE_PROVIDER_SITE_OTHER): Payer: Medicare Other

## 2023-03-29 ENCOUNTER — Telehealth: Payer: Self-pay | Admitting: Neurology

## 2023-03-29 ENCOUNTER — Inpatient Hospital Stay: Payer: Medicare HMO | Admitting: Neurology

## 2023-03-29 VITALS — BP 137/56 | HR 63 | Temp 98.7°F | Ht 65.0 in | Wt 220.0 lb

## 2023-03-29 DIAGNOSIS — Z780 Asymptomatic menopausal state: Secondary | ICD-10-CM

## 2023-03-29 DIAGNOSIS — E1169 Type 2 diabetes mellitus with other specified complication: Secondary | ICD-10-CM

## 2023-03-29 DIAGNOSIS — R195 Other fecal abnormalities: Secondary | ICD-10-CM

## 2023-03-29 DIAGNOSIS — E1159 Type 2 diabetes mellitus with other circulatory complications: Secondary | ICD-10-CM

## 2023-03-29 DIAGNOSIS — N183 Chronic kidney disease, stage 3 unspecified: Secondary | ICD-10-CM

## 2023-03-29 DIAGNOSIS — J410 Simple chronic bronchitis: Secondary | ICD-10-CM | POA: Diagnosis not present

## 2023-03-29 DIAGNOSIS — E1122 Type 2 diabetes mellitus with diabetic chronic kidney disease: Secondary | ICD-10-CM

## 2023-03-29 DIAGNOSIS — Z794 Long term (current) use of insulin: Secondary | ICD-10-CM

## 2023-03-29 DIAGNOSIS — Z7985 Long-term (current) use of injectable non-insulin antidiabetic drugs: Secondary | ICD-10-CM | POA: Diagnosis not present

## 2023-03-29 DIAGNOSIS — E785 Hyperlipidemia, unspecified: Secondary | ICD-10-CM

## 2023-03-29 DIAGNOSIS — E559 Vitamin D deficiency, unspecified: Secondary | ICD-10-CM

## 2023-03-29 DIAGNOSIS — Z8673 Personal history of transient ischemic attack (TIA), and cerebral infarction without residual deficits: Secondary | ICD-10-CM

## 2023-03-29 DIAGNOSIS — E1165 Type 2 diabetes mellitus with hyperglycemia: Secondary | ICD-10-CM | POA: Diagnosis not present

## 2023-03-29 DIAGNOSIS — I152 Hypertension secondary to endocrine disorders: Secondary | ICD-10-CM | POA: Diagnosis not present

## 2023-03-29 LAB — BAYER DCA HB A1C WAIVED: HB A1C (BAYER DCA - WAIVED): 7.7 % — ABNORMAL HIGH (ref 4.8–5.6)

## 2023-03-29 LAB — LIPID PANEL

## 2023-03-29 MED ORDER — ATORVASTATIN CALCIUM 80 MG PO TABS: 80.0000 mg | ORAL_TABLET | Freq: Every day | ORAL | 1 refills | Status: DC

## 2023-03-29 MED ORDER — LANCET DEVICE MISC: 12 refills | Status: AC

## 2023-03-29 MED ORDER — AMLODIPINE BESYLATE 10 MG PO TABS: 10.0000 mg | ORAL_TABLET | Freq: Every day | ORAL | 0 refills | Status: DC

## 2023-03-29 MED ORDER — ATENOLOL 100 MG PO TABS: 100.0000 mg | ORAL_TABLET | Freq: Every morning | ORAL | 0 refills | Status: DC

## 2023-03-29 NOTE — Progress Notes (Signed)
Subjective:  Patient ID: Brooke [name], female    DOB: 1956/01/09, 68 y.o.   MRN: [id number]  Patient Care Team: [name], FNP as PCP - General (Family Medicine) Danella Maiers, [facility] (Pharmacist) Delora Fuel, [place] (Optometry) Dione Booze, Bertram Millard, MD as Consulting Physician (Ophthalmology) Ricky Stabs, RN as [facility] Care Management (General Practice)   Chief Complaint:  Medical Management of Chronic Issues  HPI: Brooke [name] is a 68 y.o. female presenting on 03/29/2023 for Medical Management of Chronic Issues  HPI 1. Hypertension associated with diabetes (HCC) Has BP monitor at home Yes BP at home average 140-150/80 ROS Denies anxiety, fatigue, peripheral edema, changes to vision, chest pain, headaches, palpitations, sweats, SOB, PND, orthopnea Meds amlodipine, lisinopril daily  CAD risks known cardiac disease, hypertension, hypercholesterolemia/hyperlipidemia, COPD   2. Simple chronic bronchitis (HCC) COPD, Follow up  She was last seen for this 1 weeks ago. Changes made include working with pharmacy to get medications covered .   She reports fair compliance with treatment. She is having side effects.  she uses rescue inhaler 0 per days.- could not afford it. Using Nebulizer every 1-2 days.  She IS experiencing states that she is not having any symptoms and is feeling better from her exacerbation. she reports breathing is Improved States that a friend of hers gave her a symbicort device to try.   Pulmonary Functions Testing Results:  No results found for: "FEV1", "FVC", "FEV1FVC", "TLC"  -----------------------------------------------------------------------------------------  3. Type 2 diabetes mellitus with hyperglycemia, with long-term current use of insulin (HCC) Glucometer: CGM   High at home: 400, taking prednisone; Low at home: 57, Taking medication(s): Trulicity, basaglar, humalog. She is doing 15 units before every meal. Takes  basaglar before bedtime.  States that she is having lows in the morning. States that once it dropped 1 hour after eating. States that it is not happening often.  Reviewed CGM and most lows happen between 10 am and 2 pm.  States that with lows she is feeling "nervous-like"  Has appt with [name] on Thursday of next week.  Last eye exam: due Last foot exam: due in April  Last A1c:  Lab Results  Component Value Date   HGBA1C 8.2 (H) 12/13/2022   Nephropathy screen indicated?: abnormal, established with [facility]  Last flu, zoster and/or pneumovax:  Immunization History  Administered Date(s) Administered   Fluad Quad(high Dose 65+) 03/13/2019, 11/28/2019, 12/10/2020   PFIZER(Purple Top)SARS-COV-2 Vaccination 04/12/2019, 05/03/2019, 01/14/2020    ROS: Denies dizziness, LOC, polyuria, polydipsia, unintended weight loss/gain, foot ulcerations, numbness or tingling in extremities, shortness of breath or chest pain.   4. Long-term (current) use of injectable non-insulin antidiabetic drugs Continues on trulicity   5. Vitamin D insufficiency She has not been taking vitamin D supplement  Denies new bone pain. Denies fracture. Endorses fatigue   6. CKD stage 3 due to type 2 diabetes mellitus (HCC) Established with [facility]. Seen on Saturday.   7. Hyperlipidemia associated with type 2 diabetes mellitus (HCC) Lipid/Cholesterol, Follow-up  Last lipid panel Other pertinent labs  Lab Results  Component Value Date   CHOL 157 12/13/2022   HDL 31 (L) 12/13/2022   LDLCALC 81 12/13/2022   TRIG 223 (H) 12/13/2022   CHOLHDL 5.1 12/13/2022   Lab Results  Component Value Date   ALT 16 03/12/2023   AST 14 (L) 03/12/2023   PLT 362 03/12/2023   TSH 1.880 03/13/2019     Management since that visit includes lipitor, increased  dose and tolerating .  She reports excellent compliance with treatment. She is not having side effects.   Symptoms: No chest pain No chest pressure/discomfort  No dyspnea  No lower extremity edema  No numbness or tingling of extremity No orthopnea  No palpitations No paroxysmal nocturnal dyspnea  No speech difficulty No syncope   Current diet: in general, an "unhealthy" diet Current exercise: none  The ASCVD Risk score (Arnett DK, et al., 2019) failed to calculate for the following reasons:   Risk score cannot be calculated because patient has a medical history suggesting prior/existing ASCVD  ---------------------------------------------------------------------------------------------------   8. Positive colorectal cancer screening using Cologuard test Does not want to follow up with GI.    Relevant past medical, surgical, family, and social history reviewed and updated as indicated.  Allergies and medications reviewed and updated. Data reviewed: Chart in Epic.   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   Hypertension    Hyperuricemia 11/27/2020   Obesity (BMI 35.0-39.9 without comorbidity)    Renal mass 08/04/2021   Vertigo    Vitamin D insufficiency 11/27/2020    Past Surgical History:  Procedure Laterality Date   BREAST SURGERY Right 1974   OPERATIVE ULTRASOUND N/A 08/04/2021   Procedure: OPERATIVE ULTRASOUND;  Surgeon: Sebastian Ache, MD;  Location: [facility];  Service: Urology;  Laterality: N/A;   ROBOTIC ASSITED PARTIAL NEPHRECTOMY Right 08/04/2021   Procedure: XI ROBOTIC ASSITED RADICAL NEPHRECTOMY;  Surgeon: Sebastian Ache, MD;  Location: [facility];  Service: Urology;  Laterality: Right;    Social History   Socioeconomic History   Marital status: Married    Spouse name: Donnie   Number of children: 1   Years of education: Not on file   Highest education  level: Not on file  Occupational History   Not on file  Tobacco Use   Smoking status: Never   Smokeless tobacco: Never  Vaping Use   Vaping status: Never Used  Substance and Sexual Activity   Alcohol use: No   Drug use: No   Sexual activity: Never  Other Topics Concern   Not on file  Social History Narrative   1 daughter    1 grandson   Social Drivers of Corporate investment banker 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  (02/23/2023)   Overall Financial Resource Strain (CARDIA)    Difficulty of Paying Living Expenses: Not hard at all  Food Insecurity: No Food Insecurity (12/14/2022)   Hunger Vital Sign    Worried About Running Out of Food in the Last Year: Never true    Ran Out of Food in the Last Year: Never true  Transportation Needs: No Transportation Needs (12/14/2022)   PRAPARE - Administrator, Civil Service (Medical): No    Lack of Transportation (Non-Medical): No  Physical Activity: Inactive (02/23/2023)   Exercise Vital Sign    Days of Exercise per Week: 0 days    Minutes of Exercise per Session: 0 min  Stress: No Stress Concern Present (04/25/2022)   [employer] of Occupational Health - Occupational Stress Questionnaire    Feeling of Stress : Not at all  Social Connections: Moderately Integrated (  04/25/2022)   Social Connection and Isolation Panel [NHANES]    Frequency of Communication with Friends and Family: More than three times a week    Frequency of Social Gatherings with Friends and Family: More than three times a week    Attends Religious Services: More than 4 times per year    Active Member of [employer] or Organizations: No    Attends Banker Meetings: Never    Marital Status: Married  Catering manager Violence: Not At Risk (12/14/2022)   Humiliation, Afraid, Rape, and Kick questionnaire    Fear of Current or Ex-Partner: No    Emotionally Abused: No    Physically Abused: No    Sexually Abused: No    Outpatient Encounter Medications as of  03/29/2023  Medication Sig   acetaminophen (TYLENOL) 500 MG tablet Take 1,000 mg by mouth every 8 (eight) hours as needed for moderate pain.   albuterol (PROVENTIL) (2.5 MG/3ML) 0.083% nebulizer solution Take 3 mLs (2.5 mg total) by nebulization every 6 (six) hours as needed for wheezing or shortness of breath.   amLODipine (NORVASC) 10 MG tablet Take 1 tablet by mouth once daily   Ascorbic Acid (VITA-C PO) Take 1 tablet by mouth 2 (two) times a week.   aspirin EC 81 MG tablet Take 1 tablet (81 mg total) by mouth daily. Swallow whole.   atenolol (TENORMIN) 100 MG tablet TAKE 1 TABLET BY MOUTH IN THE MORNING   atorvastatin (LIPITOR) 80 MG tablet Take 1 tablet (80 mg total) by mouth daily.   carbamide peroxide (DEBROX) 6.5 % OTIC solution Place 5 drops into both ears 2 (two) times daily.   Cholecalciferol 25 MCG (1000 UT) capsule Take 1,000 Units by mouth every other day.   Continuous Glucose Receiver (FREESTYLE LIBRE 3 READER) DEVI 1 Device by Does not apply route as directed. with compatible Freestyle Libre 3 sensor to monitor glucose continuously. DX:E11.65   Continuous Glucose Sensor (FREESTYLE LIBRE 3 PLUS SENSOR) MISC Change sensor every 15 days.   Dulaglutide (TRULICITY) 4.5 MG/0.5ML SOPN Inject 4.5 mg as directed once a week.   Insulin Glargine (BASAGLAR KWIKPEN) 100 UNIT/ML Inject 37 Units into the skin daily.   insulin lispro (HUMALOG KWIKPEN) 100 UNIT/ML KwikPen Inject 10-15 Units into the skin See admin instructions. 10 units WITH BREAKFAST AND LUNCH, TAKE 15 units at [facility]   Insulin Pen Needle 31G X 6 MM MISC Use to inject insulin 4 times daily as directed. DX: E11.65   lisinopril (ZESTRIL) 10 MG tablet Take 10 mg by mouth every other day.   meclizine (ANTIVERT) 25 MG tablet Take 1 tablet (25 mg total) by mouth 3 (three) times daily as needed for dizziness.   Multiple Vitamin (MULTIVITAMIN ADULT PO) Take 1 tablet by mouth every other day.   Nebulizer MISC USE AS DIRECTED    promethazine-dextromethorphan (PROMETHAZINE-DM) 6.25-15 MG/5ML syrup Take 5 mLs by mouth 4 (four) times daily as needed.   albuterol (VENTOLIN HFA) 108 (90 Base) MCG/ACT inhaler Inhale 2 puffs into the lungs every 6 (six) hours as needed for wheezing or shortness of breath. (Patient not taking: Reported on 03/29/2023)   Fluticasone-Umeclidin-Vilant (TRELEGY ELLIPTA) 100-62.5-25 MCG/ACT AEPB Inhale 1 puff into the lungs daily. (Patient not taking: Reported on 03/29/2023)   No facility-administered encounter medications on file as of 03/29/2023.    Allergies  Allergen Reactions   Jardiance [Empagliflozin]     Holding per nephro, pt reports no allergy, Dr just didn't want pt taking it  Chauncey Mann [Finerenone]     Holding per nephro, pt reports no allergy, Dr just didn't want pt taking it   Metformin And Related     Contraindicated due to GFR; GI side effects Pt states not a real allergy    Review of Systems At per HPI  Objective:  BP (!) 137/56   Pulse 63   Temp 98.7 F (37.1 C)   Ht 5\' 5"  (1.651 m)   Wt 220 lb (99.8 kg)   LMP 03/16/2012   SpO2 96%   BMI 36.61 kg/m    Wt Readings from Last 3 Encounters:  03/29/23 220 lb (99.8 kg)  03/22/23 218 lb (98.9 kg)  03/12/23 219 lb (99.3 kg)   Physical Exam Constitutional:      General: She is awake. She is not in acute distress.    Appearance: Normal appearance. She is well-developed and well-groomed. She is obese. She is not ill-appearing, toxic-appearing or diaphoretic.  Cardiovascular:     Rate and Rhythm: Normal rate and regular rhythm.     Pulses: Normal pulses.          Radial pulses are 2+ on the right side and 2+ on the left side.       Posterior tibial pulses are 2+ on the right side and 2+ on the left side.     Heart sounds: Normal heart sounds. No murmur heard.    No gallop.  Pulmonary:     Effort: Pulmonary effort is normal. No respiratory distress.     Breath sounds: No stridor. No wheezing, rhonchi or rales.      Comments: Coarse lung sounds in bilateral bases  Musculoskeletal:     Cervical back: Full passive range of motion without pain and neck supple.     Right lower leg: No edema.     Left lower leg: No edema.  Skin:    General: Skin is warm.     Capillary Refill: Capillary refill takes less than 2 seconds.  Neurological:     General: No focal deficit present.     Mental Status: She is alert, oriented to person, place, and time and easily aroused. Mental status is at baseline.     GCS: GCS eye subscore is 4. GCS verbal subscore is 5. GCS motor subscore is 6.     Motor: No weakness.  Psychiatric:        Attention and Perception: Attention and perception normal.        Mood and Affect: Mood and affect normal.        Speech: Speech normal.        Behavior: Behavior normal. Behavior is cooperative.        Thought Content: Thought content normal. Thought content does not include homicidal or suicidal ideation. Thought content does not include homicidal or suicidal plan.        Cognition and Memory: Cognition and memory normal.        Judgment: Judgment normal.     Results for orders placed or performed during the hospital encounter of 03/12/23  CBC with Differential   Collection Time: 03/12/23  4:04 PM  Result Value Ref Range   WBC 8.2 4.0 - 10.5 K/uL   RBC 4.53 3.87 - 5.11 MIL/uL   Hemoglobin 11.6 (L) 12.0 - 15.0 g/dL   HCT 16.1 09.6 - 04.5 %   MCV 87.4 80.0 - 100.0 fL   MCH 25.6 (L) 26.0 - 34.0 pg   MCHC 29.3 (L) 30.0 -  36.0 g/dL   RDW 16.1 09.6 - 04.5 %   Platelets 362 150 - 400 K/uL   nRBC 0.0 0.0 - 0.2 %   Neutrophils Relative % 56 %   Neutro Abs 4.6 1.7 - 7.7 K/uL   Lymphocytes Relative 25 %   Lymphs Abs 2.0 0.7 - 4.0 K/uL   Monocytes Relative 5 %   Monocytes Absolute 0.4 0.1 - 1.0 K/uL   Eosinophils Relative 13 %   Eosinophils Absolute 1.1 (H) 0.0 - 0.5 K/uL   Basophils Relative 1 %   Basophils Absolute 0.1 0.0 - 0.1 K/uL   Immature Granulocytes 0 %   Abs Immature  Granulocytes 0.02 0.00 - 0.07 K/uL  Comprehensive metabolic panel   Collection Time: 03/12/23  4:04 PM  Result Value Ref Range   Sodium 139 135 - 145 mmol/L   Potassium 4.7 3.5 - 5.1 mmol/L   Chloride 108 98 - 111 mmol/L   CO2 23 22 - 32 mmol/L   Glucose, Bld 125 (H) 70 - 99 mg/dL   BUN 22 8 - 23 mg/dL   Creatinine, Ser 4.09 (H) 0.44 - 1.00 mg/dL   Calcium 9.1 8.9 - 81.1 mg/dL   Total Protein 7.2 6.5 - 8.1 g/dL   Albumin 3.7 3.5 - 5.0 g/dL   AST 14 (L) 15 - 41 U/L   ALT 16 0 - 44 U/L   Alkaline Phosphatase 84 38 - 126 U/L   Total Bilirubin 0.8 0.0 - 1.2 mg/dL   GFR, Estimated 25 (L) >60 mL/min   Anion gap 8 5 - 15  POC CBG, ED   Collection Time: 03/12/23  4:38 PM  Result Value Ref Range   Glucose-Capillary 125 (H) 70 - 99 mg/dL  Resp panel by RT-PCR (RSV, Flu A&B, Covid) Anterior Nasal Swab   Collection Time: 03/12/23  4:39 PM   Specimen: Anterior Nasal Swab  Result Value Ref Range   SARS Coronavirus 2 by RT PCR NEGATIVE NEGATIVE   Influenza A by PCR NEGATIVE NEGATIVE   Influenza B by PCR NEGATIVE NEGATIVE   Resp Syncytial Virus by PCR NEGATIVE NEGATIVE       03/29/2023    3:09 PM 03/22/2023   11:43 AM 12/27/2022    3:28 PM 11/10/2022    1:42 PM 10/25/2022   11:03 AM  Depression screen PHQ 2/9  Decreased Interest 0 0 0 0 0  Down, Depressed, Hopeless 0 0 0 0 0  PHQ - 2 Score 0 0 0 0 0  Altered sleeping  0 0 0 0  Tired, decreased energy  0 0 0 0  Change in appetite  0 0 0 0  Feeling bad or failure about yourself    0 0 0  Trouble concentrating  0 0 0 0  Moving slowly or fidgety/restless  0 0 0 0  Suicidal thoughts  0 0 0 0  PHQ-9 Score  0 0 0 0  Difficult doing work/chores   Not difficult at all Not difficult at all Not difficult at all       03/22/2023   11:43 AM 12/27/2022    3:28 PM 11/10/2022    1:42 PM 10/25/2022   11:03 AM  GAD 7 : Generalized Anxiety Score  Nervous, Anxious, on Edge 0 0 0 0  Control/stop worrying 0 0 0 0  Worry too much - different things  0 0 0 0  Trouble relaxing 0 0 0 0  Restless 0 0 0 0  Easily  annoyed or irritable 0 0 0 0  Afraid - awful might happen 0 0 0 0  Total GAD 7 Score 0 0 0 0  Anxiety Difficulty Not difficult at all  Not difficult at all Not difficult at all    Pertinent labs & imaging results that were available during my care of the patient were reviewed by me and considered in my medical decision making.  Assessment & Plan:  Brooke [name] was seen today for medical management of chronic issues.  Diagnoses and all orders for this visit:  1. Hypertension associated with diabetes (HCC) (Primary) Patient reports above goal measurements at home. Based on measurements in office, will not make adjustments at this time. Patient to continue monitoring BP at home and to bring log to follow up appt. Labs as below. Will communicate results to patient once available. Will await results to determine next steps. Refills provided. Discussed with patient that she is on elevated dose of atenolol. Patient is aware and does not wish to change regimen unless BP improves. Will optimize lisinopril in conjunction with nephrology so that we can decrease dose of atenolol.  - CBC with Differential/Platelet - CMP14+EGFR - amLODipine (NORVASC) 10 MG tablet; Take 1 tablet (10 mg total) by mouth daily.  Dispense: 90 tablet; Refill: 0 - atenolol (TENORMIN) 100 MG tablet; Take 1 tablet (100 mg total) by mouth every morning.  Dispense: 90 tablet; Refill: 0  2. Simple chronic bronchitis (HCC) Improved lung sounds. Patient completed two rounds of oral steroids, which increased BG. Working with pharmacy to get prescription of rescue inhaler and maintenance therapy. Discussed with patient to not take medications that were not prescribed to her.   3. Type 2 diabetes mellitus with hyperglycemia, with long-term current use of insulin (HCC) Not at goal, improved. Will coordinate with pharmacy to determine if patient needs to decrease basal insulin. Labs  as below. Will communicate results to patient once available. Will await results to determine next steps.  - Bayer DCA Hb A1c Waived - Vitamin B12 - Lancet Device MISC; Check BG prn  Dispense: 100 each; Refill: 12  4. Long-term (current) use of injectable non-insulin antidiabetic drugs As above.  - Lancet Device MISC; Check BG prn  Dispense: 100 each; Refill: 12  5. Vitamin D insufficiency Labs as below. Will communicate results to patient once available. Will await results to determine next steps.  - VITAMIN D 25 Hydroxy (Vit-D Deficiency, Fractures)  6. CKD stage 3 due to type 2 diabetes mellitus [facility]) Patient is established with [facility]. Patient to continue to follow up with him.   7. Hyperlipidemia associated with type 2 diabetes mellitus (HCC) Labs as below. Will communicate results to patient once available. Will await results to determine next steps. Patient to continue to statin due to history of CVA.  - Lipid panel - atorvastatin (LIPITOR) 80 MG tablet; Take 1 tablet (80 mg total) by mouth daily.  Dispense: 30 tablet; Refill: 1  8. Positive colorectal cancer screening using Cologuard test Declines follow up with GI. Discussed at length risks of not following up. Patient verbalized understanding and declines.   9. History of CVA (cerebrovascular accident) Refill provided.  - atorvastatin (LIPITOR) 80 MG tablet; Take 1 tablet (80 mg total) by mouth daily.  Dispense: 30 tablet; Refill: 1  Continue all other maintenance medications.  Follow up plan: Return in about 3 months (around 06/27/2023) for Chronic Condition Follow up.   Continue healthy lifestyle choices, including diet (rich in fruits, vegetables, and  lean proteins, and low in salt and simple carbohydrates) and exercise (at least 30 minutes of moderate physical activity daily).  Written and verbal instructions provided   The above assessment and management plan was discussed with the patient. The patient verbalized  understanding of and has agreed to the management plan. Patient is aware to call the clinic if they develop any new symptoms or if symptoms persist or worsen. Patient is aware when to return to the clinic for a follow-up visit. Patient educated on when it is appropriate to go to the emergency department.   Neale Burly, DNP-FNP [facility] [address] [place], [place] [address] [phone]

## 2023-03-29 NOTE — Telephone Encounter (Signed)
Pt canceled appt due to weather but no openings showing on my end to r/s. Would like to be squeezed in if possible. Requesting call back

## 2023-03-29 NOTE — Patient Instructions (Signed)

## 2023-03-30 LAB — CMP14+EGFR
ALT: 24 IU/L (ref 0–32)
AST: 15 IU/L (ref 0–40)
Albumin: 3.6 g/dL — ABNORMAL LOW (ref 3.9–4.9)
Alkaline Phosphatase: 102 IU/L (ref 44–121)
BUN/Creatinine Ratio: 13 (ref 12–28)
BUN: 29 mg/dL — ABNORMAL HIGH (ref 8–27)
CO2: 21 mmol/L (ref 20–29)
Calcium: 8.4 mg/dL — ABNORMAL LOW (ref 8.7–10.3)
Chloride: 108 mmol/L — ABNORMAL HIGH (ref 96–106)
Creatinine, Ser: 2.2 mg/dL — ABNORMAL HIGH (ref 0.57–1.00)
Globulin, Total: 2.2 g/dL (ref 1.5–4.5)
Glucose: 60 mg/dL — ABNORMAL LOW (ref 70–99)
Potassium: 4.5 mmol/L (ref 3.5–5.2)
Sodium: 144 mmol/L (ref 134–144)
Total Protein: 5.8 g/dL — ABNORMAL LOW (ref 6.0–8.5)
eGFR: 24 mL/min/{1.73_m2} — ABNORMAL LOW (ref >59)

## 2023-03-30 LAB — CBC WITH DIFFERENTIAL/PLATELET
Basophils Absolute: 0 10*3/uL (ref 0.0–0.2)
Basos: 0 %
EOS (ABSOLUTE): 0.2 10*3/uL (ref 0.0–0.4)
Eos: 2 %
Hematocrit: 35.8 % (ref 34.0–46.6)
Hemoglobin: 11.3 g/dL (ref 11.1–15.9)
Immature Grans (Abs): 0 10*3/uL (ref 0.0–0.1)
Immature Granulocytes: 0 %
Lymphocytes Absolute: 3.3 10*3/uL — ABNORMAL HIGH (ref 0.7–3.1)
Lymphs: 30 %
MCH: 26.2 pg — ABNORMAL LOW (ref 26.6–33.0)
MCHC: 31.6 g/dL (ref 31.5–35.7)
MCV: 83 fL (ref 79–97)
Monocytes Absolute: 0.9 10*3/uL (ref 0.1–0.9)
Monocytes: 8 %
Neutrophils Absolute: 6.4 10*3/uL (ref 1.4–7.0)
Neutrophils: 60 %
Platelets: 449 10*3/uL (ref 150–450)
RBC: 4.32 x10E6/uL (ref 3.77–5.28)
RDW: 15 % (ref 11.7–15.4)
WBC: 10.8 10*3/uL (ref 3.4–10.8)

## 2023-03-30 LAB — LIPID PANEL
Cholesterol, Total: 155 mg/dL (ref 100–199)
HDL: 44 mg/dL (ref >39)
LDL CALC COMMENT:: 3.5 ratio (ref 0.0–4.4)
LDL Chol Calc (NIH): 74 mg/dL (ref 0–99)
Triglycerides: 227 mg/dL — ABNORMAL HIGH (ref 0–149)
VLDL Cholesterol Cal: 37 mg/dL (ref 5–40)

## 2023-03-30 LAB — VITAMIN D 25 HYDROXY (VIT D DEFICIENCY, FRACTURES): Vit D, 25-Hydroxy: 22.6 ng/mL — ABNORMAL LOW (ref 30.0–100.0)

## 2023-03-30 LAB — VITAMIN B12: Vitamin B-12: 612 pg/mL (ref 232–1245)

## 2023-03-31 ENCOUNTER — Encounter: Payer: Self-pay | Admitting: Family Medicine

## 2023-03-31 ENCOUNTER — Other Ambulatory Visit: Payer: Self-pay | Admitting: *Deleted

## 2023-03-31 DIAGNOSIS — Z780 Asymptomatic menopausal state: Secondary | ICD-10-CM | POA: Diagnosis not present

## 2023-03-31 NOTE — Progress Notes (Signed)
CBC with slight variations, not concerning at this time. CMP stable. Albumin and total protein are slightly low. Patient can increase protein in diet. GFR stable, please continue to follow up with nephrology.  Triglycerides remain elevated, remaining Lipid panel looks good. I would like to switch patient from lipitor to crestor as it can improve triglycerides more than lipitor.  Vitamin D slightly low. Recommend patient start a supplement of [phone] international units daily.  A1C is stable. Would like for her to follow up with [name] next week.

## 2023-04-03 ENCOUNTER — Telehealth: Payer: Self-pay | Admitting: Family Medicine

## 2023-04-03 DIAGNOSIS — E1165 Type 2 diabetes mellitus with hyperglycemia: Secondary | ICD-10-CM

## 2023-04-03 MED ORDER — INSULIN PEN NEEDLE 31G X 6 MM MISC: 11 refills | Status: AC

## 2023-04-03 NOTE — Telephone Encounter (Signed)
Copied from CRM #[id number]. Topic: Clinical - Prescription Issue >> Apr 03, 2023  8:49 AM Jorje Guild R wrote: Reason for CRM: Patient needs Authorization for Pen needles and that was 3 weeks ago. She called pharmacy and still not there. She need  a prescription sent in for needles for her to take her insulin. Patient is not able to take insulin without the needles.

## 2023-04-03 NOTE — Telephone Encounter (Signed)
Sent to the pharmacy called to let patient know.

## 2023-04-04 ENCOUNTER — Telehealth: Payer: Self-pay | Admitting: *Deleted

## 2023-04-04 ENCOUNTER — Other Ambulatory Visit: Payer: Self-pay | Admitting: *Deleted

## 2023-04-04 NOTE — Telephone Encounter (Signed)
Attempted to call the patient to offer opening with Dr Marjory Lies today at 11 am. No answer.

## 2023-04-04 NOTE — Patient Outreach (Signed)
  Care Management   Follow Up Note   04/04/2023 Name: Brooke [name] MRN: [id number] DOB: 08-Aug-1955   Referred by: [name], FNP Reason for referral : Care Management (RNCM: Attempt #1 Follow Up For Chronic Disease Management & Care Coordination Needs)   An unsuccessful telephone outreach was attempted today. The patient was referred to the case management team for assistance with care management and care coordination.   Follow Up Plan: The care management team will reach out to the patient again over the next 30 days.   Rosalene Billings, BSN RN [facility], [facility] RN Care Manager Direct Dial: [phone]  Fax: [phone]

## 2023-04-06 ENCOUNTER — Other Ambulatory Visit: Payer: Medicare Other

## 2023-04-07 ENCOUNTER — Encounter: Payer: Self-pay | Admitting: Family Medicine

## 2023-04-07 NOTE — Progress Notes (Signed)
DEXA scan showed normal bone density.  Recommend optimizing Vit D 800 IU daily, 1200mg  of Calcium daily. Weight bearing exercise. Recheck in 2 years.

## 2023-04-13 ENCOUNTER — Telehealth: Payer: Self-pay | Admitting: Pharmacist

## 2023-04-13 DIAGNOSIS — J410 Simple chronic bronchitis: Secondary | ICD-10-CM

## 2023-04-13 MED ORDER — BREZTRI AEROSPHERE 160-9-4.8 MCG/ACT IN AERO: 2.0000 | INHALATION_SPRAY | Freq: Two times a day (BID) | RESPIRATORY_TRACT | 5 refills | Status: DC

## 2023-04-13 NOTE — Telephone Encounter (Signed)
   Patient [place] for Breztri  as well (new start).  Updated RX escribed to [facility] mail order (pharmacy for [place]).  Patient is stable on current regimen.  Route me entire PAP  Thank you! Andric Kerce

## 2023-04-13 NOTE — Telephone Encounter (Signed)
 04/13/2023 Name: Brooke [name] MRN: [id number] DOB: [birth year]  Chief Complaint  Patient presents with   Medication Management    Brooke [name] is a 68 y.o. year old female who presented for a telephone visit.   They were referred to the pharmacist by their PCP for assistance in managing diabetes, hyperlipidemia, and medication access.    Subjective:  Care Team: Primary Care Provider: Cathlene Marry Lenis, FNP ; Next Scheduled Visit: 06/27/23   Medication Access/Adherence  Current Pharmacy:  [facility] [address] [place] - [address] [place] [address] Phone: [phone] Fax: [phone]    Patient reports affordability concerns with their medications: Yes  Patient reports access/transportation concerns to their pharmacy: No  Patient reports adherence concerns with their medications:  No     Diabetes:  Current medications: trulicity , basaglar  37 units, humalog  5-15 units TID meals Medications tried in the past: empagliflozin , dapagliflozin , metformin  (all were held/stopped per nephro) Denies personal and family history of Medullary thyroid  cancer (MTC)    Current glucose readings: FBG<130 Using libre 3 --phone not compatible/needs reader   -Patient is testing blood sugar 6 times daily -Patient is injecting insulin  4 or more times daily -She greatly benefits from a continuous glucose monitoring system (I.e. libre or dexcom)   Patient denies hypoglycemic s/sx including dizziness, shakiness, sweating. Patient  denies hyperglycemic symptoms including polyuria, polydipsia, polyphagia, nocturia, neuropathy, blurred vision.   Current meal patterns:  -follow renal/T2DM diet per nephro   Current physical activity: encouraged as able   Current medication access support:  -BASAGLAR , HUMALOG  & TRULICITY  via [facility]; meds ship to pt home; ESCRIBE TO [facility] ALL shipments (new, refill, dose changes can take up to 4-6  weeks)   Hyperlipidemia/ASCVD Risk Reduction  Current lipid lowering medications: atorvastatin  (would not switch to rosuvastatin due to renal fcn); LDL 74--atorvastatin  maximized will follow LDL--goal <70; may need to target LDL<55 Medications tried in the past:   Antiplatelet regimen: ASA, s/p 30 days of plavix  in 12/2022  ASCVD History: stroke 12/2022 Family History: n/a Risk Factors: T2DM, hld, htn   Current medication access support: n/a  Clinical ASCVD: Yes  The ASCVD Risk score (Arnett DK, et al., 2019) failed to calculate for the following reasons:   Risk score cannot be calculated because patient has a medical history suggesting prior/existing ASCVD   Objective:  Lab Results  Component Value Date   HGBA1C 7.7 (H) 03/29/2023    Lab Results  Component Value Date   CREATININE 2.20 (H) 03/29/2023   BUN 29 (H) 03/29/2023   NA 144 03/29/2023   K 4.5 03/29/2023   CL 108 (H) 03/29/2023   CO2 21 03/29/2023    Lab Results  Component Value Date   CHOL 155 03/29/2023   HDL 44 03/29/2023   LDLCALC 74 03/29/2023   TRIG 227 (H) 03/29/2023   CHOLHDL 3.5 03/29/2023    Medications Reviewed Today     Reviewed by Billee Mliss BIRCH, [facility] (Pharmacist) on 04/13/23 at 1544  Med List Status: <None>   Medication Order Taking? Sig Documenting Provider Last Dose Status Informant  acetaminophen  (TYLENOL ) 500 MG tablet [id number] No Take 1,000 mg by mouth every 8 (eight) hours as needed for moderate pain. [provider] Taking Active Self  albuterol  (PROVENTIL ) (2.5 MG/3ML) 0.083% nebulizer solution [id number] No Take 3 mLs (2.5 mg total) by nebulization every 6 (six) hours as needed for wheezing or shortness of breath. Cathlene Marry Lenis, FNP  Taking Active   albuterol  (VENTOLIN  HFA) 108 (90 Base) MCG/ACT inhaler [id number] No Inhale 2 puffs into the lungs every 6 (six) hours as needed for wheezing or shortness of breath.  Patient not taking: Reported on 03/29/2023   Merlynn Niki FALCON, FNP Not Taking Active Self  amLODipine  (NORVASC ) 10 MG tablet [id number]  Take 1 tablet (10 mg total) by mouth daily. Cathlene Marry Lenis, FNP  Active   Ascorbic Acid (VITA-C PO) [id number] No Take 1 tablet by mouth 2 (two) times a week. [provider] Taking Active Self  aspirin  EC 81 MG tablet [id number] No Take 1 tablet (81 mg total) by mouth daily. Swallow whole. Willette Adriana LABOR, MD Taking Active   atenolol  (TENORMIN ) 100 MG tablet [id number]  Take 1 tablet (100 mg total) by mouth every morning. Cathlene Marry Lenis, FNP  Active   atorvastatin  (LIPITOR) 80 MG tablet [id number]  Take 1 tablet (80 mg total) by mouth daily. Cathlene Marry Lenis, FNP  Active   Budeson-Glycopyrrol-Formoterol  (BREZTRI  AEROSPHERE) 160-9-4.8 MCG/ACT TERESE [id number]  Inhale 2 puffs into the lungs 2 (two) times daily. Cathlene Marry Lenis, FNP  Active   carbamide peroxide (DEBROX) 6.5 % OTIC solution [id number] No Place 5 drops into both ears 2 (two) times daily. Francesca Elsie CROME, MD Taking Active   Cholecalciferol 25 MCG (1000 UT) capsule [id number] No Take 1,000 Units by mouth every other day. [provider] Taking Active Self  Continuous Glucose Receiver (FREESTYLE LIBRE 3 READER) DEVI [id number] No 1 Device by Does not apply route as directed. with compatible Freestyle Libre 3 sensor to monitor glucose continuously. DX:E11.65 Cathlene Marry Lenis, FNP Taking Active Self  Continuous Glucose Sensor (FREESTYLE LIBRE 3 PLUS SENSOR) MISC [id number] No Change sensor every 15 days. Severa Rock HERO, FNP Taking Active   Dulaglutide  (TRULICITY ) 4.5 MG/0.5ML SOPN [id number] No Inject 4.5 mg as directed once a week. Severa Rock HERO, FNP Taking Active Self  Patient not taking:  Discontinued 04/13/23 1533 (Change in therapy)            Med Note EVONNE, VINIE BIRCH   Wed Mar 29, 2023  2:59 PM) Too expensive   Insulin  Glargine (BASAGLAR  KWIKPEN) 100 UNIT/ML [id number] No Inject 37 Units into the  skin daily. Cathlene Marry Lenis, FNP Taking Active Self  insulin  lispro (HUMALOG  KWIKPEN) 100 UNIT/ML KwikPen [id number] No Inject 10-15 Units into the skin See admin instructions. 10 units WITH BREAKFAST AND LUNCH, TAKE 15 units at [facility], Marry Lenis, FNP Taking Active Self  Insulin  Pen Needle 31G X 6 MM MISC [id number]  Use to inject insulin  4 times daily as directed. DX: E11.65 Cathlene Marry Lenis, FNP  Active   Lancet Device MISC [id number]  Check BG prn Cathlene Marry Lenis, FNP  Active   lisinopril  (ZESTRIL ) 10 MG tablet [id number] No Take 10 mg by mouth every other day. [provider] Taking Active Self  meclizine  (ANTIVERT ) 25 MG tablet [id number] No Take 1 tablet (25 mg total) by mouth 3 (three) times daily as needed for dizziness. Francesca Elsie CROME, MD Taking Active   Multiple Vitamin (MULTIVITAMIN ADULT PO) [id number] No Take 1 tablet by mouth every other day. [provider] Taking Active Self  Nebulizer MISC [id number] No USE AS DIRECTED [provider] Taking Active   promethazine-dextromethorphan (PROMETHAZINE-DM) 6.25-15 MG/5ML syrup [id number] No Take 5 mLs by mouth 4 (four) times daily as needed. [provider] Taking Active   Med List Note Tena Mliss BIRCH, [facility]  06/09/21 1043): trulicity , basaglar , humalog  escribe to [facility] mail order symbicort -escribe to [facility] mail order jardiance -escribe to [facility] mail order             Assessment/Plan:   Diabetes: - Currently controlled - Reviewed long term cardiovascular and renal outcomes of uncontrolled blood sugar - Reviewed goal A1c, goal fasting, and goal 2 hour post prandial  - Recommend to continue current meds Instructed patient to decrease humalog  to 5-7 units when eating lower carb meal due to some concern/reported hypoglycemia  - Patient denies personal or family history of multiple endocrine neoplasia type 2, medullary thyroid  cancer; personal  history of pancreatitis or gallbladder disease. - Recommend to check glucose using libre CGM  -will enroll patient in [place] for Breztri  inhaler PAP    Follow Up Plan: 1 month  Mliss Tarry Griffin, PharmD, BCACP, CPP Clinical Pharmacist, [facility]

## 2023-04-14 ENCOUNTER — Telehealth: Payer: Self-pay

## 2023-04-14 ENCOUNTER — Other Ambulatory Visit: Payer: Medicare Other

## 2023-04-14 DIAGNOSIS — J410 Simple chronic bronchitis: Secondary | ICD-10-CM

## 2023-04-14 NOTE — Progress Notes (Signed)
 Pharmacy Medication Assistance Program Note    04/24/2023  Patient ID: Brooke [name], female   DOB: 1955-10-22, 68 y.o.   MRN: [id number]     04/14/2023  Outreach [employer]  Manufacturer Medication One Nurse, Adult Drugs Bretztri  Type of Radiographer, Therapeutic Assistance  Date Application Sent to Prescriber 04/14/2023  Date Application Received From Provider 04/20/2023  Date Application Submitted to Manufacturer 04/24/2023    New PAP submitted.  Please resend RX to [facility] due to application just being submitted (taking as a new precaution since previous apps were approved but company didn't accept med being sent before application, which delayed shipment; per rep).

## 2023-04-18 ENCOUNTER — Other Ambulatory Visit: Payer: Self-pay | Admitting: *Deleted

## 2023-04-18 DIAGNOSIS — E1169 Type 2 diabetes mellitus with other specified complication: Secondary | ICD-10-CM

## 2023-04-18 DIAGNOSIS — Z8673 Personal history of transient ischemic attack (TIA), and cerebral infarction without residual deficits: Secondary | ICD-10-CM

## 2023-04-18 MED ORDER — ATORVASTATIN CALCIUM 80 MG PO TABS
80.0000 mg | ORAL_TABLET | Freq: Every day | ORAL | 0 refills | Status: DC
Start: 2023-04-18 — End: 2023-07-26

## 2023-04-25 MED ORDER — BREZTRI AEROSPHERE 160-9-4.8 MCG/ACT IN AERO: 2.0000 | INHALATION_SPRAY | Freq: Two times a day (BID) | RESPIRATORY_TRACT | 5 refills | Status: DC

## 2023-04-25 NOTE — Telephone Encounter (Signed)
Escribed Breztri to [employer]

## 2023-04-28 ENCOUNTER — Ambulatory Visit: Payer: Medicare Other

## 2023-04-28 VITALS — Ht 65.0 in | Wt 220.0 lb

## 2023-04-28 DIAGNOSIS — Z0000 Encounter for general adult medical examination without abnormal findings: Secondary | ICD-10-CM | POA: Diagnosis not present

## 2023-04-28 NOTE — Patient Instructions (Signed)
Ms. Maniaci , Thank you for taking time to come for your Medicare Wellness Visit. I appreciate your ongoing commitment to your health goals. Please review the following plan we discussed and let me know if I can assist you in the future.   Referrals/Orders/Follow-Ups/Clinician Recommendations: Aim for 30 minutes of exercise or brisk walking, 6-8 glasses of water, and 5 servings of fruits and vegetables each day.  This is a list of the screening recommended for you and due dates:  Health Maintenance  Topic Date Due   DTaP/Tdap/Td vaccine (1 - Tdap) Never done   Mammogram  Never done   COVID-19 Vaccine (4 - 2024-25 season) 11/06/2022   Flu Shot  06/05/2023*   Pneumonia Vaccine (1 of 2 - PCV) 06/23/2023*   Hepatitis C Screening  06/23/2023*   Yearly kidney health urinalysis for diabetes  10/25/2023*   Zoster (Shingles) Vaccine (1 of 2) 10/25/2023*   Complete foot exam   06/23/2023   Hemoglobin A1C  09/26/2023   Cologuard (Stool DNA test)  11/23/2023   Eye exam for diabetics  01/03/2024   Yearly kidney function blood test for diabetes  03/28/2024   Medicare Annual Wellness Visit  04/27/2024   DEXA scan (bone density measurement)  03/30/2025   HPV Vaccine  Aged Out  *Topic was postponed. The date shown is not the original due date.    Advanced directives: (ACP Link)Information on Advanced Care Planning can be found at [facility] [place] Advance Health Care Directives Advance Health Care Directives ([email or web address]   Next Medicare Annual Wellness Visit scheduled for next year: Yes

## 2023-04-28 NOTE — Progress Notes (Signed)
Subjective:   Brooke [name] is a 68 y.o. who presents for a Medicare Wellness preventive visit.  Visit Complete: Virtual I connected with  Brooke [name] on 04/28/23 by a audio enabled telemedicine application and verified that I am speaking with the correct person using two identifiers.  Patient Location: Home  Provider Location: Home Office  I discussed the limitations of evaluation and management by telemedicine. The patient expressed understanding and agreed to proceed.  Vital Signs: Because this visit was a virtual/telehealth visit, some criteria may be missing or patient reported. Any vitals not documented were not able to be obtained and vitals that have been documented are patient reported.  VideoDeclined- This patient declined Librarian, academic. Therefore the visit was completed with audio only.  AWV Questionnaire: No: Patient Medicare AWV questionnaire was not completed prior to this visit.  Cardiac Risk Factors include: advanced age (>27men, >39 women);hypertension;diabetes mellitus;dyslipidemia     Objective:    Today's Vitals   04/28/23 1557  Weight: 220 lb (99.8 kg)  Height: 5\' 5"  (1.651 m)   Body mass index is 36.61 kg/m.     04/28/2023    4:05 PM 03/12/2023    2:47 PM 12/14/2022    3:58 AM 12/13/2022    7:15 PM 04/25/2022   12:05 PM 02/10/2022   12:45 PM 08/04/2021    2:00 PM  Advanced Directives  Does Patient Have a Medical Advance Directive? No No  No No No No  Would patient like information on creating a medical advance directive? Yes (MAU/Ambulatory/Procedural Areas - Information given)  No - Patient declined  No - Patient declined No - Patient declined No - Patient declined    Current Medications (verified) Outpatient Encounter Medications as of 04/28/2023  Medication Sig   acetaminophen (TYLENOL) 500 MG tablet Take 1,000 mg by mouth every 8 (eight) hours as needed for moderate pain.   albuterol (PROVENTIL) (2.5 MG/3ML)  0.083% nebulizer solution Take 3 mLs (2.5 mg total) by nebulization every 6 (six) hours as needed for wheezing or shortness of breath.   amLODipine (NORVASC) 10 MG tablet Take 1 tablet (10 mg total) by mouth daily.   Ascorbic Acid (VITA-C PO) Take 1 tablet by mouth 2 (two) times a week.   aspirin EC 81 MG tablet Take 1 tablet (81 mg total) by mouth daily. Swallow whole.   atenolol (TENORMIN) 100 MG tablet Take 1 tablet (100 mg total) by mouth every morning.   atorvastatin (LIPITOR) 80 MG tablet Take 1 tablet (80 mg total) by mouth daily.   Budeson-Glycopyrrol-Formoterol (BREZTRI AEROSPHERE) 160-9-4.8 MCG/ACT AERO Inhale 2 puffs into the lungs 2 (two) times daily.   carbamide peroxide (DEBROX) 6.5 % OTIC solution Place 5 drops into both ears 2 (two) times daily.   Cholecalciferol 25 MCG (1000 UT) capsule Take 1,000 Units by mouth every other day.   Continuous Glucose Receiver (FREESTYLE LIBRE 3 READER) DEVI 1 Device by Does not apply route as directed. with compatible Freestyle Libre 3 sensor to monitor glucose continuously. DX:E11.65   Continuous Glucose Sensor (FREESTYLE LIBRE 3 PLUS SENSOR) MISC Change sensor every 15 days.   Dulaglutide (TRULICITY) 4.5 MG/0.5ML SOPN Inject 4.5 mg as directed once a week.   Insulin Glargine (BASAGLAR KWIKPEN) 100 UNIT/ML Inject 37 Units into the skin daily.   insulin lispro (HUMALOG KWIKPEN) 100 UNIT/ML KwikPen Inject 10-15 Units into the skin See admin instructions. 10 units WITH BREAKFAST AND LUNCH, TAKE 15 units at [facility]   Insulin Pen  Needle 31G X 6 MM MISC Use to inject insulin 4 times daily as directed. DX: E11.65   Lancet Device MISC Check BG prn   lisinopril (ZESTRIL) 10 MG tablet Take 10 mg by mouth every other day.   meclizine (ANTIVERT) 25 MG tablet Take 1 tablet (25 mg total) by mouth 3 (three) times daily as needed for dizziness.   Multiple Vitamin (MULTIVITAMIN ADULT PO) Take 1 tablet by mouth every other day.   Nebulizer MISC USE AS DIRECTED    albuterol (VENTOLIN HFA) 108 (90 Base) MCG/ACT inhaler Inhale 2 puffs into the lungs every 6 (six) hours as needed for wheezing or shortness of breath. (Patient not taking: Reported on 04/28/2023)   promethazine-dextromethorphan (PROMETHAZINE-DM) 6.25-15 MG/5ML syrup Take 5 mLs by mouth 4 (four) times daily as needed. (Patient not taking: Reported on 04/28/2023)   No facility-administered encounter medications on file as of 04/28/2023.    Allergies (verified) Jardiance [empagliflozin], Kerendia [finerenone], and Metformin and related   History: 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   Hypertension    Hyperuricemia 11/27/2020   Obesity (BMI 35.0-39.9 without comorbidity)    Renal mass 08/04/2021   Vertigo    Vitamin D insufficiency 11/27/2020   Past Surgical History:  Procedure Laterality Date   BREAST SURGERY Right 1974   OPERATIVE ULTRASOUND N/A 08/04/2021   Procedure: OPERATIVE ULTRASOUND;  Surgeon: Sebastian Ache, MD;  Location: [facility];  Service: Urology;  Laterality: N/A;   ROBOTIC ASSITED PARTIAL NEPHRECTOMY Right 08/04/2021   Procedure: XI ROBOTIC ASSITED RADICAL NEPHRECTOMY;  Surgeon: Sebastian Ache, MD;  Location: [facility];  Service: Urology;  Laterality: Right;   Family History  Problem Relation Age of Onset   Diabetes Mother    Congestive Heart Failure Mother    Kidney disease Father    Diabetes Sister    Diabetes Brother    Hypertension Daughter    Hypertension Sister    Cervical cancer Sister    Social History   Socioeconomic History   Marital status: Married    Spouse name: Donnie   Number of children: 1   Years of education: Not on file   Highest education level: Not on file   Occupational History   Not on file  Tobacco Use   Smoking status: Never   Smokeless tobacco: Never  Vaping Use   Vaping status: Never Used  Substance and Sexual Activity   Alcohol use: No   Drug use: No   Sexual activity: Never  Other Topics Concern   Not on file  Social History Narrative   1 daughter    1 grandson   Social Drivers of Corporate investment banker 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  (04/28/2023)   Overall Financial Resource Strain (CARDIA)    Difficulty of Paying Living Expenses: Not hard at all  Food Insecurity: No Food Insecurity (04/28/2023)   Hunger Vital Sign    Worried About Running Out of Food in the Last Year: Never true    Ran Out of Food in the Last Year: Never true  Transportation Needs: No Transportation Needs (04/28/2023)   PRAPARE - Administrator, Civil Service (Medical):  No    Lack of Transportation (Non-Medical): No  Physical Activity: Inactive (04/28/2023)   Exercise Vital Sign    Days of Exercise per Week: 0 days    Minutes of Exercise per Session: 0 min  Stress: No Stress Concern Present (04/28/2023)   [employer] of Occupational Health - Occupational Stress Questionnaire    Feeling of Stress : Not at all  Social Connections: Moderately Integrated (04/28/2023)   Social Connection and Isolation Panel [NHANES]    Frequency of Communication with Friends and Family: More than three times a week    Frequency of Social Gatherings with Friends and Family: Three times a week    Attends Religious Services: More than 4 times per year    Active Member of Clubs or Organizations: No    Attends Banker Meetings: Never    Marital Status: Married    Tobacco Counseling Counseling given: Not Answered    Clinical Intake:  Pre-visit preparation completed: Yes  Pain : No/denies pain     Diabetes: Yes CBG done?: No Did pt. bring in CBG monitor from home?: No  How often do you need to have someone help you when you read instructions,  pamphlets, or other written materials from your doctor or pharmacy?: 1 - Never  Interpreter Needed?: No  Information entered by :: Kandis Fantasia LPN   Activities of Daily Living     04/28/2023    4:05 PM 12/14/2022    3:23 AM  In your present state of health, do you have any difficulty performing the following activities:  Hearing? 0 0  Vision? 0 0  Difficulty concentrating or making decisions? 0 0  Walking or climbing stairs? 0   Dressing or bathing? 0   Doing errands, shopping? 0 0  Preparing Food and eating ? N   Using the Toilet? N   In the past six months, have you accidently leaked urine? N   Do you have problems with loss of bowel control? N   Managing your Medications? N   Managing your Finances? N   Housekeeping or managing your Housekeeping? N     Patient Care Team: [name], [name], FNP as PCP - General (Family Medicine) Danella Maiers, [facility] (Pharmacist) Delora Fuel, OD (Optometry) Dione Booze, Bertram Millard, MD as Consulting Physician (Ophthalmology) Ricky Stabs, RN as VBCI Care Management (General Practice) Randa Lynn, MD as Referring Physician (Nephrology)  Indicate any recent Medical Services you may have received from other than [facility] providers in the past year (date may be approximate).     Assessment:   This is a routine wellness examination for Lysha.  Hearing/Vision screen Hearing Screening - Comments:: Denies hearing difficulties   Vision Screening - Comments:: Wears rx glasses - up to date with routine eye exams with [facility]    Goals Addressed   None    Depression Screen     04/28/2023    4:04 PM 03/29/2023    3:09 PM 03/22/2023   11:43 AM 12/27/2022    3:28 PM 11/10/2022    1:42 PM 10/25/2022   11:03 AM 07/21/2022    1:35 PM  PHQ 2/9 Scores  PHQ - 2 Score 0 0 0 0 0 0 0  PHQ- 9 Score 0  0 0 0 0 0    Fall Risk     04/28/2023    4:05 PM 03/29/2023    3:09 PM 03/22/2023   11:43 AM 12/27/2022    3:28 PM  11/10/2022     1:54 PM  Fall Risk   Falls in the past year? 0 0 0 0 0  Number falls in past yr: 0 0 0 0 0  Injury with Fall? 0 0 0 0 0  Risk for fall due to : No Fall Risks No Fall Risks No Fall Risks No Fall Risks No Fall Risks  Follow up Falls prevention discussed;Education provided;Falls evaluation completed Falls evaluation completed Falls evaluation completed Falls evaluation completed Falls evaluation completed    MEDICARE RISK AT HOME:  Medicare Risk at Home Any stairs in or around the home?: No If so, are there any without handrails?: No Home free of loose throw rugs in walkways, pet beds, electrical cords, etc?: Yes Adequate lighting in your home to reduce risk of falls?: Yes Life alert?: No Use of a cane, walker or w/c?: No Grab bars in the bathroom?: Yes Shower chair or bench in shower?: No Elevated toilet seat or a handicapped toilet?: Yes  TIMED UP AND GO:  Was the test performed?  No  Cognitive Function: 6CIT completed        04/28/2023    4:05 PM 04/25/2022   12:05 PM 02/01/2021    2:55 PM 01/13/2020   10:42 AM  6CIT Screen  What Year? 0 points 0 points 0 points 0 points  What month? 0 points 0 points 0 points 0 points  What time? 0 points 0 points 0 points 0 points  Count back from 20 0 points 0 points 0 points 0 points  Months in reverse 0 points 0 points 0 points 0 points  Repeat phrase 0 points 0 points 0 points 0 points  Total Score 0 points 0 points 0 points 0 points    Immunizations Immunization History  Administered Date(s) Administered   Fluad Quad(high Dose 65+) 03/13/2019, 11/28/2019, 12/10/2020   PFIZER(Purple Top)SARS-COV-2 Vaccination 04/12/2019, 05/03/2019, 01/14/2020    Screening Tests Health Maintenance  Topic Date Due   DTaP/Tdap/Td (1 - Tdap) Never done   MAMMOGRAM  Never done   COVID-19 Vaccine (4 - 2024-25 season) 11/06/2022   INFLUENZA VACCINE  06/05/2023 (Originally 10/06/2022)   Pneumonia Vaccine 63+ Years old (1 of 2 - PCV) 06/23/2023  (Originally 03/03/1962)   Hepatitis C Screening  06/23/2023 (Originally [birth year]03/1972)   Diabetic kidney evaluation - Urine ACR  10/25/2023 (Originally 05/13/2022)   Zoster Vaccines- Shingrix (1 of 2) 10/25/2023 (Originally 03/03/1973)   FOOT EXAM  06/23/2023   HEMOGLOBIN A1C  09/26/2023   Fecal DNA (Cologuard)  11/23/2023   OPHTHALMOLOGY EXAM  01/03/2024   Diabetic kidney evaluation - eGFR measurement  03/28/2024   Medicare Annual Wellness (AWV)  04/27/2024   DEXA SCAN  03/30/2025   HPV VACCINES  Aged Out    Health Maintenance  Health Maintenance Due  Topic Date Due   DTaP/Tdap/Td (1 - Tdap) Never done   MAMMOGRAM  Never done   COVID-19 Vaccine (4 - 2024-25 season) 11/06/2022   Health Maintenance Items Addressed: Declines vaccines   Additional Screening:  Vision Screening: Recommended annual ophthalmology exams for early detection of glaucoma and other disorders of the eye.  Dental Screening: Recommended annual dental exams for proper oral hygiene  Community Resource Referral / Chronic Care Management: CRR required this visit?  No   CCM required this visit?  No     Plan:     I have personally reviewed and noted the following in the patient's chart:   Medical and social history Use of  alcohol, tobacco or illicit drugs  Current medications and supplements including opioid prescriptions. Patient is not currently taking opioid prescriptions. Functional ability and status Nutritional status Physical activity Advanced directives List of other physicians Hospitalizations, surgeries, and ER visits in previous 12 months Vitals Screenings to include cognitive, depression, and falls Referrals and appointments  In addition, I have reviewed and discussed with patient certain preventive protocols, quality metrics, and best practice recommendations. A written personalized care plan for preventive services as well as general preventive health recommendations were provided to  patient.     Kandis Fantasia [place], [place]   [id number]/[id number]   After Visit Summary: (Mail) Due to this being a telephonic visit, the after visit summary with patients personalized plan was offered to patient via mail   Notes: Nothing significant to report at this time.

## 2023-05-03 ENCOUNTER — Telehealth: Payer: Self-pay | Admitting: *Deleted

## 2023-05-03 NOTE — Progress Notes (Signed)
 Complex Care Management Care Guide Note  05/03/2023 Name: Brooke [name] MRN: [id number] DOB: [birth year]  Brooke [name] is a 68 y.o. year old female who is a primary care patient of Ellamae Sia, [name], FNP and is actively engaged with the care management team. I reached out to [name] by phone today to assist with re-scheduling  with the RN Case Manager.  Follow up plan: Unsuccessful telephone outreach attempt made. A HIPAA compliant phone message was left for the patient providing contact information and requesting a return call.  Gwenevere Ghazi  [facility]  [facility], [facility] Guide  Direct Dial: [phone]  Fax [phone]

## 2023-05-10 ENCOUNTER — Ambulatory Visit: Payer: Medicare HMO | Admitting: Diagnostic Neuroimaging

## 2023-05-10 ENCOUNTER — Encounter: Payer: Self-pay | Admitting: Diagnostic Neuroimaging

## 2023-05-10 ENCOUNTER — Telehealth: Payer: Self-pay

## 2023-05-10 VITALS — BP 169/81 | HR 66 | Ht 65.0 in | Wt 219.0 lb

## 2023-05-10 DIAGNOSIS — E1159 Type 2 diabetes mellitus with other circulatory complications: Secondary | ICD-10-CM | POA: Diagnosis not present

## 2023-05-10 DIAGNOSIS — Z794 Long term (current) use of insulin: Secondary | ICD-10-CM

## 2023-05-10 DIAGNOSIS — E1165 Type 2 diabetes mellitus with hyperglycemia: Secondary | ICD-10-CM

## 2023-05-10 DIAGNOSIS — Z8673 Personal history of transient ischemic attack (TIA), and cerebral infarction without residual deficits: Secondary | ICD-10-CM

## 2023-05-10 DIAGNOSIS — I152 Hypertension secondary to endocrine disorders: Secondary | ICD-10-CM

## 2023-05-10 NOTE — Progress Notes (Signed)
 [facility]  PATIENT: Brooke [name] DOB: 07-18-1955  REFERRING CLINICIAN: Charlsie Quest, MD HISTORY FROM: patient  REASON FOR VISIT: new consult   HISTORICAL  CHIEF COMPLAINT:  Chief Complaint  Patient presents with   Follow-up    Pt in 7 alone Pt here for stroke f/u  Pt states 1 headache in last month Pt states gait is off     HISTORY OF PRESENT ILLNESS:   68 year old female with hypertension, diabetes, renal cell carcinoma status post nephrectomy presented to hospital on 12/13/2022 due to onset of right sided vision loss.  Symptoms have resolved within a few minutes of onset. Patient was admitted to the hospital.  She was diagnosed with left PCA ischemic infarction.  Stroke workup was completed.  Since that time patient is doing well.  Completed 3 weeks of dual antiplatelet with aspirin and Plavix, now on aspirin alone.   REVIEW OF SYSTEMS: Full 14 system review of systems performed and negative with exception of: as per HPI.  ALLERGIES: Allergies  Allergen Reactions   Jardiance [Empagliflozin]     Holding per nephro, pt reports no allergy, Dr just didn't want pt taking it   Chauncey Mann [Finerenone]     Holding per nephro, pt reports no allergy, Dr just didn't want pt taking it   Metformin And Related     Contraindicated due to GFR; GI side effects Pt states not a real allergy    HOME MEDICATIONS: Outpatient Medications Prior to Visit  Medication Sig Dispense Refill   acetaminophen (TYLENOL) 500 MG tablet Take 1,000 mg by mouth every 8 (eight) hours as needed for moderate pain.     albuterol (PROVENTIL) (2.5 MG/3ML) 0.083% nebulizer solution Take 3 mLs (2.5 mg total) by nebulization every 6 (six) hours as needed for wheezing or shortness of breath. 150 mL 1   albuterol (VENTOLIN HFA) 108 (90 Base) MCG/ACT inhaler Inhale 2 puffs into the lungs every 6 (six) hours as needed for wheezing or shortness of breath. 18 g 2   amLODipine (NORVASC) 10 MG  tablet Take 1 tablet (10 mg total) by mouth daily. 90 tablet 0   Ascorbic Acid (VITA-C PO) Take 1 tablet by mouth 2 (two) times a week.     aspirin EC 81 MG tablet Take 1 tablet (81 mg total) by mouth daily. Swallow whole. 30 tablet 12   atenolol (TENORMIN) 100 MG tablet Take 1 tablet (100 mg total) by mouth every morning. 90 tablet 0   atorvastatin (LIPITOR) 80 MG tablet Take 1 tablet (80 mg total) by mouth daily. 90 tablet 0   Budeson-Glycopyrrol-Formoterol (BREZTRI AEROSPHERE) 160-9-4.8 MCG/ACT AERO Inhale 2 puffs into the lungs 2 (two) times daily. 32.1 g 5   carbamide peroxide (DEBROX) 6.5 % OTIC solution Place 5 drops into both ears 2 (two) times daily. 15 mL 0   Cholecalciferol 25 MCG (1000 UT) capsule Take 1,000 Units by mouth every other day.     Continuous Glucose Receiver (FREESTYLE LIBRE 3 READER) DEVI 1 Device by Does not apply route as directed. with compatible Freestyle Libre 3 sensor to monitor glucose continuously. DX:E11.65 1 each 1   Continuous Glucose Sensor (FREESTYLE LIBRE 3 PLUS SENSOR) MISC Change sensor every 15 days. 1 each 5   Dulaglutide (TRULICITY) 4.5 MG/0.5ML SOPN Inject 4.5 mg as directed once a week. 6 mL 3   Insulin Glargine (BASAGLAR KWIKPEN) 100 UNIT/ML Inject 37 Units into the skin daily. 45 mL 3   insulin lispro (HUMALOG KWIKPEN)  100 UNIT/ML KwikPen Inject 10-15 Units into the skin See admin instructions. 10 units WITH BREAKFAST AND LUNCH, TAKE 15 units at DINNER 45 mL 5   Insulin Pen Needle 31G X 6 MM MISC Use to inject insulin 4 times daily as directed. DX: E11.65 400 each 11   Lancet Device MISC Check BG prn 100 each 12   lisinopril (ZESTRIL) 10 MG tablet Take 10 mg by mouth every other day.     Multiple Vitamin (MULTIVITAMIN ADULT PO) Take 1 tablet by mouth every other day.     Nebulizer MISC USE AS DIRECTED     promethazine-dextromethorphan (PROMETHAZINE-DM) 6.25-15 MG/5ML syrup Take 5 mLs by mouth as needed for cough.     meclizine (ANTIVERT) 25 MG  tablet Take 1 tablet (25 mg total) by mouth 3 (three) times daily as needed for dizziness. 30 tablet 0   No facility-administered medications prior to visit.    PAST MEDICAL HISTORY: 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   Hypertension    Hyperuricemia 11/27/2020   Obesity (BMI 35.0-39.9 without comorbidity)    Renal mass 08/04/2021   Vertigo    Vitamin D insufficiency 11/27/2020    PAST SURGICAL HISTORY: Past Surgical History:  Procedure Laterality Date   BREAST SURGERY Right 1974   OPERATIVE ULTRASOUND N/A 08/04/2021   Procedure: OPERATIVE ULTRASOUND;  Surgeon: Sebastian Ache, MD;  Location: [facility];  Service: Urology;  Laterality: N/A;   ROBOTIC ASSITED PARTIAL NEPHRECTOMY Right 08/04/2021   Procedure: XI ROBOTIC ASSITED RADICAL NEPHRECTOMY;  Surgeon: Sebastian Ache, MD;  Location: [facility];  Service: Urology;  Laterality: Right;    FAMILY HISTORY: Family History  Problem Relation Age of Onset   Diabetes Mother    Congestive Heart Failure Mother    Stroke Mother    Kidney disease Father    Diabetes Sister    Hypertension Sister    Cervical cancer Sister    Diabetes Brother    Hypertension Daughter     SOCIAL HISTORY: Social History   Socioeconomic History   Marital status: Married    Spouse name: Donnie   Number of children: 1   Years of education: Not on file   Highest education level: Not on file  Occupational History   Not on file  Tobacco Use   Smoking status: Never   Smokeless tobacco: Never  Vaping Use   Vaping status: Never Used  Substance and Sexual Activity   Alcohol use: No   Drug use: No   Sexual activity: Never  Other Topics Concern   Not on file  Social  History Narrative   1 daughter    1 grandson      Lives with husband    Retired    Chief Executive Officer Drivers of Corporate investment banker 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  (04/28/2023)   Overall Financial Resource Strain (CARDIA)    Difficulty of Paying Living Expenses: Not hard at all  Food Insecurity: No Food Insecurity (04/28/2023)   Hunger Vital Sign    Worried About Running Out of Food in the Last Year: Never true    Ran Out of Food in the Last Year: Never  true  Transportation Needs: No Transportation Needs (04/28/2023)   PRAPARE - Administrator, Civil Service (Medical): No    Lack of Transportation (Non-Medical): No  Physical Activity: Inactive (04/28/2023)   Exercise Vital Sign    Days of Exercise per Week: 0 days    Minutes of Exercise per Session: 0 min  Stress: No Stress Concern Present (04/28/2023)   [employer] of Occupational Health - Occupational Stress Questionnaire    Feeling of Stress : Not at all  Social Connections: Moderately Integrated (04/28/2023)   Social Connection and Isolation Panel [NHANES]    Frequency of Communication with Friends and Family: More than three times a week    Frequency of Social Gatherings with Friends and Family: Three times a week    Attends Religious Services: More than 4 times per year    Active Member of Clubs or Organizations: No    Attends Banker Meetings: Never    Marital Status: Married  Catering manager Violence: Not At Risk (04/28/2023)   Humiliation, Afraid, Rape, and Kick questionnaire    Fear of Current or Ex-Partner: No    Emotionally Abused: No    Physically Abused: No    Sexually Abused: No     PHYSICAL EXAM  GENERAL EXAM/CONSTITUTIONAL: Vitals:  Vitals:   05/10/23 1152  BP: (!) 169/81  Pulse: 66  Weight: 219 lb (99.3 kg)  Height: 5\' 5"  (1.651 m)   Body mass index is 36.44 kg/m. Wt Readings from Last 3 Encounters:  05/10/23 219 lb (99.3 kg)  04/28/23 220 lb (99.8 kg)  03/29/23 220 lb (99.8 kg)    Patient is in no distress; well developed, nourished and groomed; neck is supple  CARDIOVASCULAR: Examination of carotid arteries is normal; no carotid bruits Regular rate and rhythm, no murmurs Examination of peripheral vascular system by observation and palpation is normal  EYES: Ophthalmoscopic exam of optic discs and posterior segments is normal; no papilledema or hemorrhages No results found.  MUSCULOSKELETAL: Gait, strength, tone, movements noted in Neurologic exam below  NEUROLOGIC: MENTAL STATUS:      No data to display         awake, alert, oriented to person, place and time recent and remote memory intact normal attention and concentration language fluent, comprehension intact, naming intact fund of knowledge appropriate  CRANIAL NERVE:  2nd - no papilledema on fundoscopic exam 2nd, 3rd, 4th, 6th - pupils equal and reactive to light, visual fields full to confrontation, extraocular muscles intact, no nystagmus 5th - facial sensation symmetric 7th - facial strength symmetric 8th - hearing intact 9th - palate elevates symmetrically, uvula midline 11th - shoulder shrug symmetric 12th - tongue protrusion midline  MOTOR:  normal bulk and tone, full strength in the BUE, BLE  SENSORY:  normal and symmetric to light touch, temperature, vibration  COORDINATION:  finger-nose-finger, fine finger movements normal  REFLEXES:  deep tendon reflexes 1+ and symmetric  GAIT/STATION:  narrow based gait    DIAGNOSTIC DATA (LABS, IMAGING, TESTING) - I reviewed patient records, labs, notes, testing and imaging myself where available.  Lab Results  Component Value Date   WBC 10.8 03/29/2023   HGB 11.3 03/29/2023   HCT 35.8 03/29/2023   MCV 83 03/29/2023   PLT 449 03/29/2023      Component Value Date/Time   NA 144 03/29/2023 1511   K 4.5 03/29/2023 1511   CL 108 (H) 03/29/2023 1511   CO2 21 03/29/2023 1511   GLUCOSE  60 (L) 03/29/2023 1511   GLUCOSE 125  (H) 03/12/2023 1604   BUN 29 (H) 03/29/2023 1511   CREATININE 2.20 (H) 03/29/2023 1511   CALCIUM 8.4 (L) 03/29/2023 1511   PROT 5.8 (L) 03/29/2023 1511   ALBUMIN 3.6 (L) 03/29/2023 1511   AST 15 03/29/2023 1511   ALT 24 03/29/2023 1511   ALKPHOS 102 03/29/2023 1511   BILITOT <0.2 03/29/2023 1511   GFRNONAA 25 (L) 03/12/2023 1604   GFRAA 39 (L) 04/14/2020 1426   Lab Results  Component Value Date   CHOL 155 03/29/2023   HDL 44 03/29/2023   LDLCALC 74 03/29/2023   TRIG 227 (H) 03/29/2023   CHOLHDL 3.5 03/29/2023   Lab Results  Component Value Date   HGBA1C 7.7 (H) 03/29/2023   Lab Results  Component Value Date   VITAMINB12 612 03/29/2023   Lab Results  Component Value Date   TSH 1.880 03/13/2019     12/14/22 MRI HEAD IMPRESSION: [I reviewed images myself and agree with interpretation. -VRP]  1. 3.2 cm acute to early subacute left PCA distribution infarct. Associated petechial hemorrhage without frank hemorrhagic transformation or significant mass effect. 2. Underlying mild chronic microvascular ischemic disease with small remote right frontal and left cerebellar infarcts.   12/14/22 MRA HEAD IMPRESSION: [I reviewed images myself and agree with interpretation. -VRP]  1. Motion degraded exam. 2. Grossly negative intracranial MRA. No large vessel occlusion, hemodynamically significant stenosis, or other acute vascular abnormality.   12/14/22 MRA NECK IMPRESSION: [I reviewed images myself and agree with interpretation. -VRP] Wide patency of both carotid artery systems and vertebral arteries within the neck. No hemodynamically significant stenosis or other acute vascular abnormality.   12/14/22 TTE  1. Left ventricular ejection fraction, by estimation, is 60 to 65%. The  left ventricle has normal function. Left ventricular endocardial border  not optimally defined to evaluate regional wall motion. Left ventricular  diastolic parameters are consistent  with Grade I  diastolic dysfunction (impaired relaxation). Elevated left  ventricular end-diastolic pressure.   2. Right ventricular systolic function is normal. The right ventricular  size is normal. Tricuspid regurgitation signal is inadequate for assessing  PA pressure.   3. The mitral valve is normal in structure. No evidence of mitral valve  regurgitation. No evidence of mitral stenosis.   4. The aortic valve is tricuspid. Aortic valve regurgitation is not  visualized. No aortic stenosis is present.   5. The inferior vena cava is normal in size with greater than 50%  respiratory variability, suggesting right atrial pressure of 3 mmHg.     ASSESSMENT AND PLAN  68 y.o. year old female here with:   Dx:  1. Chronic ischemic left PCA stroke   2. Type 2 diabetes mellitus with hyperglycemia, with long-term current use of insulin (HCC)   3. Hypertension associated with diabetes (HCC)     PLAN:  LEFT POSTERIOR CEREBRAL ARTERY STROKE (embolic appearance, unknown source; 12/13/22) - continue aspirin 81, statin (goal LDL <70), BP, DM control; per PCP - refer for implanted loop recorder (rule out afib)  Orders Placed This Encounter  Procedures   Ambulatory referral to Cardiac Electrophysiology   Return for pending if symptoms worsen or fail to improve.    Suanne Marker, MD 05/10/2023, 12:33 PM Certified in Neurology, Neurophysiology and Neuroimaging  [facility] [address], [address] [place], [place] [address] [phone]

## 2023-05-10 NOTE — Patient Instructions (Signed)
  LEFT PCA STROKE (embolic appearance, unknown source) - continue aspirin 81, statin, BP, DM control - refer for implanted loop recorder (rule out afib)

## 2023-05-10 NOTE — Telephone Encounter (Signed)
 Copied from CRM [id number]. Topic: Clinical - Medical Advice >> May 10, 2023  4:16 PM Antwanette L wrote: Reason for CRM: Patient saw neurologist Dr. Marjory Lies on 3/5. Patient blood pressure has been elevated, going up and down. The patient is calling to see if Neale Burly needs to make any changes to her medicine? Patient can be contacted through [place] and by phone.

## 2023-05-11 ENCOUNTER — Telehealth: Payer: Self-pay

## 2023-05-11 DIAGNOSIS — E1165 Type 2 diabetes mellitus with hyperglycemia: Secondary | ICD-10-CM

## 2023-05-11 DIAGNOSIS — E669 Obesity, unspecified: Secondary | ICD-10-CM

## 2023-05-11 NOTE — Progress Notes (Signed)
 Complex Care Management Care Guide Note  05/11/2023 Name: Brooke [name] MRN: [id number] DOB: 05/29/1955  Brooke [name] is a 68 y.o. year old female who is a primary care patient of Ellamae Sia, [name], FNP and is actively engaged with the care management team. I reached out to [name] by phone today to assist with re-scheduling  with the RN Case Manager.  Follow up plan: Unsuccessful telephone outreach attempt made. A HIPAA compliant phone message was left for the patient providing contact information and requesting a return call. No further outreach attempts will be made at this time. We have been unable to contact the patient to reschedule for complex care management services.   Gwenevere Ghazi  [facility] Guide  Direct Dial: [phone]  Fax [phone]

## 2023-05-11 NOTE — Telephone Encounter (Signed)
 Rec'd missing info fax from [employer].   I have no patient information for PAP. Her enrollment ended 03/07/23. Would you like PAP sent to you, or mailed to patient?

## 2023-05-12 ENCOUNTER — Emergency Department (HOSPITAL_COMMUNITY)
Admission: EM | Admit: 2023-05-12 | Discharge: 2023-05-12 | Disposition: A | Discharge status: Home / Self Care | Attending: Emergency Medicine | Admitting: Emergency Medicine

## 2023-05-12 ENCOUNTER — Encounter (HOSPITAL_COMMUNITY): Payer: Self-pay | Admitting: Emergency Medicine

## 2023-05-12 ENCOUNTER — Other Ambulatory Visit: Payer: Self-pay

## 2023-05-12 ENCOUNTER — Telehealth: Payer: Self-pay | Admitting: Pharmacist

## 2023-05-12 DIAGNOSIS — I10 Essential (primary) hypertension: Secondary | ICD-10-CM | POA: Diagnosis not present

## 2023-05-12 DIAGNOSIS — Z7982 Long term (current) use of aspirin: Secondary | ICD-10-CM | POA: Diagnosis not present

## 2023-05-12 DIAGNOSIS — Z8673 Personal history of transient ischemic attack (TIA), and cerebral infarction without residual deficits: Secondary | ICD-10-CM | POA: Insufficient documentation

## 2023-05-12 DIAGNOSIS — Z794 Long term (current) use of insulin: Secondary | ICD-10-CM | POA: Insufficient documentation

## 2023-05-12 DIAGNOSIS — Z79899 Other long term (current) drug therapy: Secondary | ICD-10-CM | POA: Diagnosis not present

## 2023-05-12 LAB — BASIC METABOLIC PANEL
Anion gap: 9 (ref 5–15)
BUN: 29 mg/dL — ABNORMAL HIGH (ref 8–23)
CO2: 23 mmol/L (ref 22–32)
Calcium: 8.6 mg/dL — ABNORMAL LOW (ref 8.9–10.3)
Chloride: 107 mmol/L (ref 98–111)
Creatinine, Ser: 2.25 mg/dL — ABNORMAL HIGH (ref 0.44–1.00)
GFR, Estimated: 23 mL/min — ABNORMAL LOW (ref >60)
Glucose, Bld: 182 mg/dL — ABNORMAL HIGH (ref 70–99)
Potassium: 4.7 mmol/L (ref 3.5–5.1)
Sodium: 139 mmol/L (ref 135–145)

## 2023-05-12 LAB — CBC WITH DIFFERENTIAL/PLATELET
Abs Immature Granulocytes: 0.03 10*3/uL (ref 0.00–0.07)
Basophils Absolute: 0.1 10*3/uL (ref 0.0–0.1)
Basophils Relative: 1 %
Eosinophils Absolute: 1.9 10*3/uL — ABNORMAL HIGH (ref 0.0–0.5)
Eosinophils Relative: 18 %
HCT: 36.4 % (ref 36.0–46.0)
Hemoglobin: 11.1 g/dL — ABNORMAL LOW (ref 12.0–15.0)
Immature Granulocytes: 0 %
Lymphocytes Relative: 20 %
Lymphs Abs: 2 10*3/uL (ref 0.7–4.0)
MCH: 26.4 pg (ref 26.0–34.0)
MCHC: 30.5 g/dL (ref 30.0–36.0)
MCV: 86.5 fL (ref 80.0–100.0)
Monocytes Absolute: 0.5 10*3/uL (ref 0.1–1.0)
Monocytes Relative: 5 %
Neutro Abs: 5.8 10*3/uL (ref 1.7–7.7)
Neutrophils Relative %: 56 %
Platelets: 405 10*3/uL — ABNORMAL HIGH (ref 150–400)
RBC: 4.21 MIL/uL (ref 3.87–5.11)
RDW: 15.4 % (ref 11.5–15.5)
WBC: 10.2 10*3/uL (ref 4.0–10.5)
nRBC: 0 % (ref 0.0–0.2)

## 2023-05-12 MED ORDER — AMLODIPINE BESYLATE 5 MG PO TABS
10.0000 mg | ORAL_TABLET | Freq: Once | ORAL | Status: AC
  Administered 2023-05-12: 10 mg via ORAL
  Filled 2023-05-12: qty 2

## 2023-05-12 MED ORDER — LISINOPRIL 10 MG PO TABS
20.0000 mg | ORAL_TABLET | Freq: Once | ORAL | Status: AC
  Administered 2023-05-12: 20 mg via ORAL
  Filled 2023-05-12: qty 2

## 2023-05-12 MED ORDER — ATENOLOL 25 MG PO TABS
100.0000 mg | ORAL_TABLET | Freq: Once | ORAL | Status: AC
  Administered 2023-05-12: 100 mg via ORAL
  Filled 2023-05-12: qty 4

## 2023-05-12 NOTE — Telephone Encounter (Signed)
   Patient was identified as falling into the [place] Measure - Diabetes.   Patient was: Referred to pharmacy for chronic disease management.   Patient currently in the ED   Will schedule f/u with pharmd within 30 days   Kieth Brightly, PharmD, BCACP, CPP Clinical Pharmacist, [facility]

## 2023-05-12 NOTE — ED Notes (Signed)
 Patient discharged. Provider spoke to patient. Paperwork given to patient and reviewed. Pt verbalized understanding. VSS. A+Ox4. Patient ambulated out of the ER with steady, independent gait. No IV in place.

## 2023-05-12 NOTE — Discharge Instructions (Signed)
 Records blood pressure daily and write it down so you can give it to your doctor in 2 weeks when you see her.  Return sooner if any problems.

## 2023-05-12 NOTE — ED Triage Notes (Signed)
 Pt reports she was feeling woozie and checked her bp. Her BP reading at home was 179/90. Pt reports she had a stroke in October and felt like she needed to be seen. PT reports her neurologist asked her to contact her regular dr about her BP. Her PCP increased one of her medications  by 10 mg. PT is compliant with medications, but has not started her new dose. Pt is complaining of right shoulder pain. Pt believes it is bursitis in her shoulder

## 2023-05-12 NOTE — Progress Notes (Unsigned)
 Pharmacy Medication Assistance Program Note    05/22/2023  Patient ID: Brooke [name], female  DOB: 01/09/1956, 68 y.o.  MRN:  [id number]     05/12/2023  Outreach Medication Two  Manufacturer Medication Two Retail [employer] Drugs Basaglar  Dose of Basaglar U100 [employer]  Type of Sport and exercise psychologist  Date Application Sent to Prescriber 05/12/2023  Date Application Received From Provider 05/19/2023  Method Application Sent to Manufacturer Fax  Date Application Submitted to Manufacturer 05/22/2023       05/22/2023  Patient ID: Brooke [name], female  DOB: [birth year], 68 y.o.  MRN:  [id number]     05/12/2023  Outreach Medication Three  Manufacturer Medication Three Lilly  [facility] Humalog  Dose of Humalog U100 KWIKPEN  Type of Assistance Manufacturer Assistance  Date Application Sent to Prescriber 05/12/2023  Date Application Received From Provider 05/19/2023  Date Application Submitted to Manufacturer 05/22/2023  Method Application Sent to Manufacturer Fax        05/22/2023  Patient ID: Brooke [name], female  DOB: 18-Feb-1956, 68 y.o.  MRN:  [id number]     05/12/2023  Outreach Medication Four  Manufacturer Medication Four Lilly  [facility] [facility] Trulicity  Type of Radiographer, therapeutic Assistance  Date Application Sent to Prescriber 05/12/2023  Date Application Received From Provider 05/19/2023  Date Application Submitted to Manufacturer 05/22/2023  Method Application Sent to Manufacturer Fax

## 2023-05-12 NOTE — ED Notes (Signed)
 Lab at bedside

## 2023-05-12 NOTE — ED Provider Notes (Signed)
 [place] EMERGENCY DEPARTMENT AT [facility] Provider Note   CSN: [id number] Arrival date & time: 05/12/23  4098     History  Chief Complaint  Patient presents with   Hypertension    Brooke [name] is a 68 y.o. female.  Patient has a history of hypertension and has had a stroke.  Patient complains of some dizziness that have resolved now.  Also her blood pressure has been elevated.  Her doctor has increased her medicine for blood pressure but she had not started it yet.  The history is provided by the patient and medical records. No language interpreter was used.  Hypertension This is a recurrent problem. The current episode started more than 1 week ago. The problem occurs constantly. The problem has not changed since onset.Pertinent negatives include no chest pain, no abdominal pain and no headaches. Nothing aggravates the symptoms. Nothing relieves the symptoms. She has tried nothing for the symptoms.       Home Medications Prior to Admission medications   Medication Sig Start Date End Date Taking? Authorizing Provider  acetaminophen (TYLENOL) 500 MG tablet Take 1,000 mg by mouth every 8 (eight) hours as needed for moderate pain.    [provider]  albuterol (PROVENTIL) (2.5 MG/3ML) 0.083% nebulizer solution Take 3 mLs (2.5 mg total) by nebulization every 6 (six) hours as needed for wheezing or shortness of breath. 03/23/23   [name], [name], FNP  albuterol (VENTOLIN HFA) 108 (90 Base) MCG/ACT inhaler Inhale 2 puffs into the lungs every 6 (six) hours as needed for wheezing or shortness of breath. 06/26/19   [name], FNP  amLODipine (NORVASC) 10 MG tablet Take 1 tablet (10 mg total) by mouth daily. 03/29/23   [name], [name], FNP  Ascorbic Acid (VITA-C PO) Take 1 tablet by mouth 2 (two) times a week.    [provider]  aspirin EC 81 MG tablet Take 1 tablet (81 mg total) by mouth daily. Swallow whole. 12/16/22   Kendell Bane, MD  atenolol (TENORMIN) 100 MG tablet Take 1 tablet (100 mg total) by mouth every morning. 03/29/23   [name], [name], FNP  atorvastatin (LIPITOR) 80 MG tablet Take 1 tablet (80 mg total) by mouth daily. 04/18/23   [name], [name], FNP  Budeson-Glycopyrrol-Formoterol (BREZTRI AEROSPHERE) 160-9-4.8 MCG/ACT AERO Inhale 2 puffs into the lungs 2 (two) times daily. 04/25/23   [name], [name], FNP  carbamide peroxide (DEBROX) 6.5 % OTIC solution Place 5 drops into both ears 2 (two) times daily. 03/12/23   [name], MD  Cholecalciferol 25 MCG (1000 UT) capsule Take 1,000 Units by mouth every other day. 01/07/21   [provider]  Continuous Glucose Receiver (FREESTYLE LIBRE 3 READER) DEVI 1 Device by Does not apply route as directed. with compatible Freestyle Libre 3 sensor to monitor glucose continuously. DX:E11.65 09/01/22   [name], [name], FNP  Continuous Glucose Sensor (FREESTYLE LIBRE 3 PLUS SENSOR) MISC Change sensor every 15 days. 12/21/22   [name], FNP  Dulaglutide (TRULICITY) 4.5 MG/0.5ML SOPN Inject 4.5 mg as directed once a week. 05/26/22   [name], FNP  Insulin Glargine (BASAGLAR KWIKPEN) 100 UNIT/ML Inject 37 Units into the skin daily. 09/01/22   [name], [name], FNP  insulin lispro (HUMALOG KWIKPEN) 100 UNIT/ML KwikPen Inject 10-15 Units into the skin See admin instructions. 10 units WITH BREAKFAST AND LUNCH, TAKE 15 units at [facility] 09/01/22   [name], [name], FNP  Insulin  Pen Needle 31G X 6 MM MISC Use to inject insulin 4 times daily as directed. DX: E11.65 04/03/23   [name], FNP  Lancet Device MISC Check BG prn 03/29/23   [name], [name], FNP  lisinopril (ZESTRIL) 10 MG tablet Take 10 mg by mouth every other day. 03/22/22   [provider]  Multiple Vitamin (MULTIVITAMIN ADULT PO) Take 1 tablet by mouth every other day.    [provider]  Nebulizer MISC USE AS DIRECTED 03/23/23    [provider]  promethazine-dextromethorphan (PROMETHAZINE-DM) 6.25-15 MG/5ML syrup Take 5 mLs by mouth as needed for cough. 02/20/23   [provider]      Allergies    Jardiance [empagliflozin], Kerendia [finerenone], and Metformin and related    Review of Systems   Review of Systems  Constitutional:  Negative for appetite change and fatigue.  HENT:  Negative for congestion, ear discharge and sinus pressure.   Eyes:  Negative for discharge.  Respiratory:  Negative for cough.   Cardiovascular:  Negative for chest pain.  Gastrointestinal:  Negative for abdominal pain and diarrhea.  Genitourinary:  Negative for frequency and hematuria.  Musculoskeletal:  Negative for back pain.  Skin:  Negative for rash.  Neurological:  Positive for dizziness. Negative for seizures and headaches.  Psychiatric/Behavioral:  Negative for hallucinations.     Physical Exam Updated Vital Signs BP (!) 168/72   Pulse (!) 59   Temp 97.9 F (36.6 C) (Oral)   Resp 18   Ht 5\' 5"  (1.651 m)   Wt 99.3 kg   LMP 03/16/2012   SpO2 100%   BMI 36.44 kg/m  Physical Exam Vitals and nursing note reviewed.  Constitutional:      Appearance: She is well-developed.  HENT:     Head: Normocephalic.     Nose: Nose normal.  Eyes:     General: No scleral icterus.    Conjunctiva/sclera: Conjunctivae normal.  Neck:     Thyroid: No thyromegaly.  Cardiovascular:     Rate and Rhythm: Normal rate and regular rhythm.     Heart sounds: No murmur heard.    No friction rub. No gallop.  Pulmonary:     Breath sounds: No stridor. No wheezing or rales.  Chest:     Chest wall: No tenderness.  Abdominal:     General: There is no distension.     Tenderness: There is no abdominal tenderness. There is no rebound.  Musculoskeletal:        General: Normal range of motion.     Cervical back: Neck supple.  Lymphadenopathy:     Cervical: No cervical adenopathy.  Skin:    Findings: No erythema or rash.   Neurological:     Mental Status: She is alert and oriented to person, place, and time.     Motor: No abnormal muscle tone.     Coordination: Coordination normal.  Psychiatric:        Behavior: Behavior normal.     ED Results / Procedures / Treatments   Labs (all labs ordered are listed, but only abnormal results are displayed) Labs Reviewed  CBC WITH DIFFERENTIAL/PLATELET - Abnormal; Notable for the following components:      Result Value   Hemoglobin 11.1 (*)    Platelets 405 (*)    Eosinophils Absolute 1.9 (*)    All other components within normal limits  BASIC METABOLIC PANEL - Abnormal; Notable for the following components:   Glucose, Bld 182 (*)  BUN 29 (*)    Creatinine, Ser 2.25 (*)    Calcium 8.6 (*)    GFR, Estimated 23 (*)    All other components within normal limits    EKG None  Radiology No results found.  Procedures Procedures    Medications Ordered in ED Medications  amLODipine (NORVASC) tablet 10 mg (10 mg Oral Given 05/12/23 1054)  atenolol (TENORMIN) tablet 100 mg (100 mg Oral Given 05/12/23 1055)  lisinopril (ZESTRIL) tablet 20 mg (20 mg Oral Given 05/12/23 1054)    ED Course/ Medical Decision Making/ A&P                                 Medical Decision Making Amount and/or Complexity of Data Reviewed Labs: ordered.  Risk Prescription drug management.   Patient with poorly controlled blood pressure.  She will start her increased and her medicines today and record her blood pressure daily for a week.  Then patient will follow-up with her PCP        Final Clinical Impression(s) / ED Diagnoses Final diagnoses:  Hypertension, unspecified type    Rx / DC Orders ED Discharge Orders     None         Bethann Berkshire, MD 05/14/23 1214

## 2023-05-16 ENCOUNTER — Telehealth: Payer: Self-pay

## 2023-05-16 NOTE — Transitions of Care (Post Inpatient/ED Visit) (Signed)
 05/16/2023  Name: Brooke [name] MRN: [id number] DOB: 01-19-1956  Today's TOC FU Call Status: Today's TOC FU Call Status:: Successful TOC FU Call Completed TOC FU Call Complete Date: 05/16/23 Patient's Name and Date of Birth confirmed.  Transition Care Management Follow-up Telephone Call Date of Discharge: 05/12/23 [facility] Type of Discharge: Emergency Department Reason for ED Visit: Other: (hypertension) How have you been since you were released from the hospital?: Better Any questions or concerns?: No  Items Reviewed: Did you receive and understand the discharge instructions provided?: Yes Medications obtained,verified, and reconciled?: Yes (Medications Reviewed) Any new allergies since your discharge?: No Dietary orders reviewed?: Yes Do you have support at home?: Yes People in Home: spouse  Medications Reviewed Today: Medications Reviewed Today     Reviewed by Karena Addison, LPN (Licensed Practical Nurse) on 05/16/23 at 1618  Med List Status: <None>   Medication Order Taking? Sig Documenting Provider Last Dose Status Informant  acetaminophen (TYLENOL) 500 MG tablet [id number] No Take 1,000 mg by mouth every 8 (eight) hours as needed for moderate pain. [provider] Taking Active Self  albuterol (PROVENTIL) (2.5 MG/3ML) 0.083% nebulizer solution [id number] No Take 3 mLs (2.5 mg total) by nebulization every 6 (six) hours as needed for wheezing or shortness of breath. [name], FNP Taking Active   albuterol (VENTOLIN HFA) 108 (90 Base) MCG/ACT inhaler [id number] No Inhale 2 puffs into the lungs every 6 (six) hours as needed for wheezing or shortness of breath. [name], FNP Taking Active Self  amLODipine (NORVASC) 10 MG tablet [id number] No Take 1 tablet (10 mg total) by mouth daily. [name], FNP Taking Active   Ascorbic Acid (VITA-C PO) [id number] No Take 1 tablet by mouth 2 (two) times a week.  [provider] Taking Active Self  aspirin EC 81 MG tablet [id number] No Take 1 tablet (81 mg total) by mouth daily. Swallow whole. Kendell Bane, MD Taking Active   atenolol (TENORMIN) 100 MG tablet [id number] No Take 1 tablet (100 mg total) by mouth every morning. [name], FNP Taking Active   atorvastatin (LIPITOR) 80 MG tablet [id number] No Take 1 tablet (80 mg total) by mouth daily. [name], FNP Taking Active   Budeson-Glycopyrrol-Formoterol (BREZTRI AEROSPHERE) 160-9-4.8 MCG/ACT Sandrea Matte [id number] No Inhale 2 puffs into the lungs 2 (two) times daily. [name], FNP Taking Active   carbamide peroxide (DEBROX) 6.5 % OTIC solution [id number] No Place 5 drops into both ears 2 (two) times daily. [name], MD Taking Active   Cholecalciferol 25 MCG (1000 UT) capsule [id number] No Take 1,000 Units by mouth every other day. [provider] Taking Active Self  Continuous Glucose Receiver (FREESTYLE LIBRE 3 READER) DEVI [id number] No 1 Device by Does not apply route as directed. with compatible Freestyle Libre 3 sensor to monitor glucose continuously. DX:E11.65 [name], FNP Taking Active Self  Continuous Glucose Sensor (FREESTYLE LIBRE 3 PLUS SENSOR) MISC [id number] No Change sensor every 15 days. [name], FNP Taking Active   Dulaglutide (TRULICITY) 4.5 MG/0.5ML SOPN [id number] No Inject 4.5 mg as directed once a week. [name], FNP Taking Active Self  Insulin Glargine [facility] KWIKPEN) 100 UNIT/ML [id number] No Inject 37 Units into the skin daily. [name], FNP Taking Active Self  insulin lispro (HUMALOG KWIKPEN) 100 UNIT/ML KwikPen [id number] No Inject 10-15 Units into the skin See admin instructions. 10 units WITH BREAKFAST AND LUNCH, TAKE 15  units at [facility], Brooke Campi, FNP Taking Active Self  Insulin Pen Needle 31G X 6 MM MISC [id number] No Use to inject insulin 4 times daily as  directed. DX: E11.65 [name], FNP Taking Active   Lancet Device MISC [id number] No Check BG prn [name], Brooke Campi, FNP Taking Active   lisinopril (ZESTRIL) 10 MG tablet [id number] No Take 10 mg by mouth every other day. [provider] Taking Active Self  Multiple Vitamin (MULTIVITAMIN ADULT PO) [id number] No Take 1 tablet by mouth every other day. [provider] Taking Active Self  Nebulizer MISC [id number] No USE AS DIRECTED [provider] Taking Active   promethazine-dextromethorphan (PROMETHAZINE-DM) 6.25-15 MG/5ML syrup [id number] No Take 5 mLs by mouth as needed for cough. [provider] Taking Active   Med List Note Danella Maiers, [facility] 06/09/21 1043): trulicity, basaglar, humalog escribe to [facility] mail order symbicort-escribe to [facility] mail order jardiance-escribe to [employer] mail order             Home Care and Equipment/Supplies: Were Home Health Services Ordered?: NA Any new equipment or medical supplies ordered?: NA  Functional Questionnaire: Do you need assistance with bathing/showering or dressing?: No Do you need assistance with meal preparation?: No Do you need assistance with eating?: No Do you have difficulty maintaining continence: No Do you need assistance with getting out of bed/getting out of a chair/moving?: No Do you have difficulty managing or taking your medications?: No  Follow up appointments reviewed: PCP Follow-up appointment confirmed?: Yes Date of PCP follow-up appointment?: 05/18/23 Follow-up Provider: [facility] Follow-up appointment confirmed?: NA Do you need transportation to your follow-up appointment?: No Do you understand care options if your condition(s) worsen?: Yes-patient verbalized understanding    SIGNATURE Karena Addison, LPN [facility] Nurse Health Advisor Direct Dial [phone]

## 2023-05-18 ENCOUNTER — Telehealth: Payer: Self-pay | Admitting: Pharmacist

## 2023-05-18 ENCOUNTER — Encounter: Payer: Self-pay | Admitting: Family Medicine

## 2023-05-18 ENCOUNTER — Ambulatory Visit (INDEPENDENT_AMBULATORY_CARE_PROVIDER_SITE_OTHER): Admitting: Family Medicine

## 2023-05-18 VITALS — BP 158/70 | HR 62 | Temp 97.8°F | Ht 65.0 in | Wt 217.0 lb

## 2023-05-18 DIAGNOSIS — N184 Chronic kidney disease, stage 4 (severe): Secondary | ICD-10-CM

## 2023-05-18 DIAGNOSIS — E1159 Type 2 diabetes mellitus with other circulatory complications: Secondary | ICD-10-CM

## 2023-05-18 DIAGNOSIS — M25511 Pain in right shoulder: Secondary | ICD-10-CM

## 2023-05-18 DIAGNOSIS — Z23 Encounter for immunization: Secondary | ICD-10-CM

## 2023-05-18 DIAGNOSIS — Z794 Long term (current) use of insulin: Secondary | ICD-10-CM

## 2023-05-18 DIAGNOSIS — G8929 Other chronic pain: Secondary | ICD-10-CM | POA: Diagnosis not present

## 2023-05-18 DIAGNOSIS — E1165 Type 2 diabetes mellitus with hyperglycemia: Secondary | ICD-10-CM

## 2023-05-18 DIAGNOSIS — I152 Hypertension secondary to endocrine disorders: Secondary | ICD-10-CM

## 2023-05-18 MED ORDER — METHYLPREDNISOLONE ACETATE 40 MG/ML IJ SUSP
40.0000 mg | Freq: Once | INTRAMUSCULAR | Status: AC
  Administered 2023-05-18: 40 mg via INTRAMUSCULAR

## 2023-05-18 NOTE — Telephone Encounter (Signed)
   Patient recently seen in the ED for hypertension.  She has increased lisinopril to 20mg  daily (3-4 days ago) and BP is still 160/80-90s Continues on atenolol, amlodipine Consider adding SGLT2 Consider hydral vs clonidine Left message with Dr. Wolfgang Phoenix at [facility] for recommendation and collaboration [phone] Will continue to follow and adjust  Kieth Brightly, PharmD, BCACP, CPP Clinical Pharmacist, [facility]

## 2023-05-18 NOTE — Progress Notes (Signed)
 Subjective:  Patient ID: Brooke [name], female    DOB: November 08, 1955, 68 y.o.   MRN: [id number]  Patient Care Team: [name], FNP as PCP - General (Family Medicine) Danella Maiers, [facility] (Pharmacist) Delora Fuel, [place] (Optometry) Dione Booze, Bertram Millard, MD as Consulting Physician (Ophthalmology) Randa Lynn, MD as Referring Physician (Nephrology)   Chief Complaint:  Follow-up (ED f/u - hypertension/Lisinopril increased to 20 mg)  HPI: Brooke [name] is a 68 y.o. female presenting on 05/18/2023 for Follow-up (ED f/u - hypertension/Lisinopril increased to 20 mg) Patient presents for hospital follow up. Patient was seen at [facility] ED for elevated blood pressure/hypertensive urgency. She has a history of CVA. When she presented to ED she had not started increased regimen for hypertensive. Patient was provided with amlodipine, lisinopril, and atenolol in ED. She was discharged with instructions to follow up with PCP in one week and to monitor BP at home. She is currently taking lisinopril 20 mg daily, amlodipine 10 mg daily, and atenolol 100 mg daily. Denies any changes to vision, headaches, shortness of breath, cough, peripheral edema. Averaging 160s/80 at home. States that her BG has been having low readings in the morning. She is eating snacks before bed and continues to have some lows. She has not been taking trulicity for the last 2 weeks.   States that she has been in really bad pain. States that her right shoulder is hurting significantly and she wonders if that is why her BP is elevated. Taking tylenol, icy hot, and heat. She would like to go to PT in [place].   She follows up with nephrology in April.   Relevant past medical, surgical, family, and social history reviewed and updated as indicated.  Allergies and medications reviewed and updated. Data reviewed: Chart in Epic.   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   Hypertension    Hyperuricemia 11/27/2020   Obesity (BMI 35.0-39.9 without comorbidity)    Renal mass 08/04/2021   Vertigo    Vitamin D insufficiency 11/27/2020    Past Surgical History:  Procedure Laterality Date   BREAST SURGERY Right 1974   OPERATIVE ULTRASOUND N/A 08/04/2021   Procedure: OPERATIVE ULTRASOUND;  Surgeon: Sebastian Ache, MD;  Location: [facility] ORS;  Service: Urology;  Laterality: N/A;   ROBOTIC ASSITED PARTIAL NEPHRECTOMY Right 08/04/2021   Procedure: XI ROBOTIC ASSITED RADICAL NEPHRECTOMY;  Surgeon: Sebastian Ache, MD;  Location: [facility] OR[facility];  Service: Urology;  Laterality: Right;    Social History   Socioeconomic History   Marital status: Married    Spouse name: Donnie   Number of children: 1   Years of education: Not on file   Highest education level: Not on file  Occupational History   Not on file  Tobacco Use   Smoking status: Never   Smokeless tobacco: Never  Vaping Use   Vaping status: Never Used  Substance and Sexual Activity   Alcohol use: No   Drug use: No   Sexual activity: Never  Other Topics Concern   Not on file  Social History Narrative   1 daughter    1 grandson  Lives with husband    Retired    Teacher, early years/pre 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  (04/28/2023)   Overall Financial Resource Strain (CARDIA)    Difficulty of Paying Living Expenses: Not hard at all  Food Insecurity: No Food Insecurity (04/28/2023)   Hunger Vital Sign    Worried About Running Out of Food in the Last Year: Never true    Ran Out of Food in the Last Year: Never true  Transportation Needs: No Transportation Needs (04/28/2023)   PRAPARE - Administrator, Civil Service (Medical): No    Lack of Transportation  (Non-Medical): No  Physical Activity: Inactive (04/28/2023)   Exercise Vital Sign    Days of Exercise per Week: 0 days    Minutes of Exercise per Session: 0 min  Stress: No Stress Concern Present (04/28/2023)   [employer] of Occupational Health - Occupational Stress Questionnaire    Feeling of Stress : Not at all  Social Connections: Moderately Integrated (04/28/2023)   Social Connection and Isolation Panel [NHANES]    Frequency of Communication with Friends and Family: More than three times a week    Frequency of Social Gatherings with Friends and Family: Three times a week    Attends Religious Services: More than 4 times per year    Active Member of Clubs or Organizations: No    Attends Banker Meetings: Never    Marital Status: Married  Catering manager Violence: Not At Risk (04/28/2023)   Humiliation, Afraid, Rape, and Kick questionnaire    Fear of Current or Ex-Partner: No    Emotionally Abused: No    Physically Abused: No    Sexually Abused: No    Outpatient Encounter Medications as of 05/18/2023  Medication Sig   acetaminophen (TYLENOL) 500 MG tablet Take 1,000 mg by mouth every 8 (eight) hours as needed for moderate pain.   albuterol (PROVENTIL) (2.5 MG/3ML) 0.083% nebulizer solution Take 3 mLs (2.5 mg total) by nebulization every 6 (six) hours as needed for wheezing or shortness of breath.   albuterol (VENTOLIN HFA) 108 (90 Base) MCG/ACT inhaler Inhale 2 puffs into the lungs every 6 (six) hours as needed for wheezing or shortness of breath.   amLODipine (NORVASC) 10 MG tablet Take 1 tablet (10 mg total) by mouth daily.   Ascorbic Acid (VITA-C PO) Take 1 tablet by mouth 2 (two) times a week.   aspirin EC 81 MG tablet Take 1 tablet (81 mg total) by mouth daily. Swallow whole.   atenolol (TENORMIN) 100 MG tablet Take 1 tablet (100 mg total) by mouth every morning.   atorvastatin (LIPITOR) 80 MG tablet Take 1 tablet (80 mg total) by mouth daily.    Budeson-Glycopyrrol-Formoterol (BREZTRI AEROSPHERE) 160-9-4.8 MCG/ACT AERO Inhale 2 puffs into the lungs 2 (two) times daily.   carbamide peroxide (DEBROX) 6.5 % OTIC solution Place 5 drops into both ears 2 (two) times daily.   Cholecalciferol 25 MCG (1000 UT) capsule Take 1,000 Units by mouth every other day.   Continuous Glucose Receiver (FREESTYLE LIBRE 3 READER) DEVI 1 Device by Does not apply route as directed. with compatible Freestyle Libre 3 sensor to monitor glucose continuously. DX:E11.65   Continuous Glucose Sensor (FREESTYLE LIBRE 3 PLUS SENSOR) MISC Change sensor every 15 days.   Dulaglutide (TRULICITY) 4.5 MG/0.5ML SOPN Inject 4.5 mg as directed once a week.   Insulin Glargine (BASAGLAR KWIKPEN) 100 UNIT/ML Inject 37 Units into the skin daily.   insulin  lispro (HUMALOG KWIKPEN) 100 UNIT/ML KwikPen Inject 10-15 Units into the skin See admin instructions. 10 units WITH BREAKFAST AND LUNCH, TAKE 15 units at [facility]   Insulin Pen Needle 31G X 6 MM MISC Use to inject insulin 4 times daily as directed. DX: E11.65   Lancet Device MISC Check BG prn   lisinopril (ZESTRIL) 10 MG tablet Take 10 mg by mouth every other day.   Multiple Vitamin (MULTIVITAMIN ADULT PO) Take 1 tablet by mouth every other day.   Nebulizer MISC USE AS DIRECTED   promethazine-dextromethorphan (PROMETHAZINE-DM) 6.25-15 MG/5ML syrup Take 5 mLs by mouth as needed for cough.   No facility-administered encounter medications on file as of 05/18/2023.    Allergies  Allergen Reactions   Jardiance [Empagliflozin]     Holding per nephro, pt reports no allergy, Dr just didn't want pt taking it   Chauncey Mann [Finerenone]     Holding per nephro, pt reports no allergy, Dr just didn't want pt taking it   Metformin And Related     Contraindicated due to GFR; GI side effects Pt states not a real allergy    Review of Systems As per HPI  Objective:  BP (!) 159/70   Pulse 62   Temp 97.8 F (36.6 C)   Ht 5\' 5"  (1.651 m)   Wt  217 lb (98.4 kg)   LMP 03/16/2012   SpO2 98%   BMI 36.11 kg/m    Wt Readings from Last 3 Encounters:  05/12/23 219 lb (99.3 kg)  05/10/23 219 lb (99.3 kg)  04/28/23 220 lb (99.8 kg)    Physical Exam Constitutional:      General: She is awake. She is not in acute distress.    Appearance: Normal appearance. She is well-developed and well-groomed. She is obese. She is not ill-appearing, toxic-appearing or diaphoretic.  Cardiovascular:     Rate and Rhythm: Normal rate and regular rhythm.     Pulses: Normal pulses.          Radial pulses are 2+ on the right side and 2+ on the left side.       Posterior tibial pulses are 2+ on the right side and 2+ on the left side.     Heart sounds: Normal heart sounds. No murmur heard.    No gallop.  Pulmonary:     Effort: Pulmonary effort is normal. No respiratory distress.     Breath sounds: Normal breath sounds. No stridor. No wheezing, rhonchi or rales.  Musculoskeletal:     Right shoulder: No swelling, deformity, effusion, laceration, tenderness, bony tenderness or crepitus. Decreased range of motion. Normal strength. Normal pulse.     Cervical back: Full passive range of motion without pain and neck supple.     Right lower leg: No edema.     Left lower leg: No edema.  Skin:    General: Skin is warm.     Capillary Refill: Capillary refill takes less than 2 seconds.  Neurological:     General: No focal deficit present.     Mental Status: She is alert, oriented to person, place, and time and easily aroused. Mental status is at baseline.     GCS: GCS eye subscore is 4. GCS verbal subscore is 5. GCS motor subscore is 6.     Motor: No weakness.  Psychiatric:        Attention and Perception: Attention and perception normal.        Mood and Affect: Mood and affect normal.  Speech: Speech normal.        Behavior: Behavior normal. Behavior is cooperative.        Thought Content: Thought content normal. Thought content does not include  homicidal or suicidal ideation. Thought content does not include homicidal or suicidal plan.        Cognition and Memory: Cognition and memory normal.        Judgment: Judgment normal.    Results for orders placed or performed during the hospital encounter of 05/12/23  CBC with Differential   Collection Time: 05/12/23 10:39 AM  Result Value Ref Range   WBC 10.2 4.0 - 10.5 K/uL   RBC 4.21 3.87 - 5.11 MIL/uL   Hemoglobin 11.1 (L) 12.0 - 15.0 g/dL   HCT 95.6 21.3 - 08.6 %   MCV 86.5 80.0 - 100.0 fL   MCH 26.4 26.0 - 34.0 pg   MCHC 30.5 30.0 - 36.0 g/dL   RDW 57.8 46.9 - 62.9 %   Platelets 405 (H) 150 - 400 K/uL   nRBC 0.0 0.0 - 0.2 %   Neutrophils Relative % 56 %   Neutro Abs 5.8 1.7 - 7.7 K/uL   Lymphocytes Relative 20 %   Lymphs Abs 2.0 0.7 - 4.0 K/uL   Monocytes Relative 5 %   Monocytes Absolute 0.5 0.1 - 1.0 K/uL   Eosinophils Relative 18 %   Eosinophils Absolute 1.9 (H) 0.0 - 0.5 K/uL   Basophils Relative 1 %   Basophils Absolute 0.1 0.0 - 0.1 K/uL   Immature Granulocytes 0 %   Abs Immature Granulocytes 0.03 0.00 - 0.07 K/uL  Basic metabolic panel   Collection Time: 05/12/23 10:39 AM  Result Value Ref Range   Sodium 139 135 - 145 mmol/L   Potassium 4.7 3.5 - 5.1 mmol/L   Chloride 107 98 - 111 mmol/L   CO2 23 22 - 32 mmol/L   Glucose, Bld 182 (H) 70 - 99 mg/dL   BUN 29 (H) 8 - 23 mg/dL   Creatinine, Ser 5.28 (H) 0.44 - 1.00 mg/dL   Calcium 8.6 (L) 8.9 - 10.3 mg/dL   GFR, Estimated 23 (L) >60 mL/min   Anion gap 9 5 - 15       04/28/2023    4:04 PM 03/29/2023    3:09 PM 03/22/2023   11:43 AM 12/27/2022    3:28 PM 11/10/2022    1:42 PM  Depression screen PHQ 2/9  Decreased Interest 0 0 0 0 0  Down, Depressed, Hopeless 0 0 0 0 0  PHQ - 2 Score 0 0 0 0 0  Altered sleeping 0  0 0 0  Tired, decreased energy 0  0 0 0  Change in appetite 0  0 0 0  Feeling bad or failure about yourself     0 0  Trouble concentrating 0  0 0 0  Moving slowly or fidgety/restless 0  0 0 0   Suicidal thoughts 0  0 0 0  PHQ-9 Score 0  0 0 0  Difficult doing work/chores    Not difficult at all Not difficult at all       03/29/2023    3:09 PM 03/22/2023   11:43 AM 12/27/2022    3:28 PM 11/10/2022    1:42 PM  GAD 7 : Generalized Anxiety Score  Nervous, Anxious, on Edge 0 0 0 0  Control/stop worrying 0 0 0 0  Worry too much - different things 0 0 0 0  Trouble  relaxing 0 0 0 0  Restless 0 0 0 0  Easily annoyed or irritable 0 0 0 0  Afraid - awful might happen  0 0 0  Total GAD 7 Score  0 0 0  Anxiety Difficulty Not difficult at all Not difficult at all  Not difficult at all   Pertinent labs & imaging results that were available during my care of the patient were reviewed by me and considered in my medical decision making.  Assessment & Plan:  Hamdi was seen today for follow-up.  Diagnoses and all orders for this visit: 1. Hypertension associated with diabetes (HCC) (Primary) Elevated BP in office today. Reached out to pharmacy and nephrology for recommendations. Discussed plan with pharmacist, Vanice Sarah, [facility]. Given patient history of CKD unable to add in hydrochlorothiazide or spiro. May consider a low dose hydralazine. In addition, patient was discontinued on SGLT2i due to renal function, reaching out to nephrology to see if this can be resumed. In addition, considered adding alpha blocker, waiting to hear back from nephrology before pursuing this treatment course. Will plan for pt to continue current plan, monitor at home and follow up in one week to optimize lisinopril.  - CMP14+EGFR - CBC with Differential/Platelet  2. CKD (chronic kidney disease) stage 4, GFR 15-29 ml/min (HCC) As above.   3. Type 2 diabetes mellitus with hyperglycemia, with long-term current use of insulin (HCC) Discussed with patient small snack before bed to prevent morning lows. Given that patient received steroid injection, will not change therapy at this time. Encouraged patient to resume  Trulicity. Patient to continue to monitor BG at home.   4. Chronic right shoulder pain Referral placed as below. Injection as below for pain. Unable to use NSAIDs due to renal function and history of CVA.  - Ambulatory referral to Physical Therapy - methylPREDNISolone acetate (DEPO-MEDROL) injection 40 mg  5. Encounter for immunization - Flu Vaccine Trivalent High Dose (Fluad)     Continue all other maintenance medications.  Follow up plan: Return in about 1 week (around 05/25/2023) for BP follow up .   Continue healthy lifestyle choices, including diet (rich in fruits, vegetables, and lean proteins, and low in salt and simple carbohydrates) and exercise (at least 30 minutes of moderate physical activity daily).  Written and verbal instructions provided   The above assessment and management plan was discussed with the patient. The patient verbalized understanding of and has agreed to the management plan. Patient is aware to call the clinic if they develop any new symptoms or if symptoms persist or worsen. Patient is aware when to return to the clinic for a follow-up visit. Patient educated on when it is appropriate to go to the emergency department.   Neale Burly, DNP-FNP [facility] [address] [place], [place] [address] [phone]

## 2023-05-19 ENCOUNTER — Encounter: Payer: Self-pay | Admitting: Family Medicine

## 2023-05-19 ENCOUNTER — Telehealth: Payer: Self-pay | Admitting: Family Medicine

## 2023-05-19 ENCOUNTER — Telehealth: Payer: Self-pay | Admitting: Pharmacist

## 2023-05-19 DIAGNOSIS — E1165 Type 2 diabetes mellitus with hyperglycemia: Secondary | ICD-10-CM

## 2023-05-19 DIAGNOSIS — E1159 Type 2 diabetes mellitus with other circulatory complications: Secondary | ICD-10-CM

## 2023-05-19 LAB — CBC WITH DIFFERENTIAL/PLATELET
Basophils Absolute: 0.1 10*3/uL (ref 0.0–0.2)
Basos: 1 %
EOS (ABSOLUTE): 1.3 10*3/uL — ABNORMAL HIGH (ref 0.0–0.4)
Eos: 14 %
Hematocrit: 36.3 % (ref 34.0–46.6)
Hemoglobin: 11.3 g/dL (ref 11.1–15.9)
Immature Grans (Abs): 0 10*3/uL (ref 0.0–0.1)
Immature Granulocytes: 0 %
Lymphocytes Absolute: 2 10*3/uL (ref 0.7–3.1)
Lymphs: 22 %
MCH: 26.3 pg — ABNORMAL LOW (ref 26.6–33.0)
MCHC: 31.1 g/dL — ABNORMAL LOW (ref 31.5–35.7)
MCV: 85 fL (ref 79–97)
Monocytes Absolute: 0.5 10*3/uL (ref 0.1–0.9)
Monocytes: 5 %
Neutrophils Absolute: 5.4 10*3/uL (ref 1.4–7.0)
Neutrophils: 58 %
Platelets: 448 10*3/uL (ref 150–450)
RBC: 4.29 x10E6/uL (ref 3.77–5.28)
RDW: 15.3 % (ref 11.7–15.4)
WBC: 9.3 10*3/uL (ref 3.4–10.8)

## 2023-05-19 LAB — CMP14+EGFR
ALT: 14 IU/L (ref 0–32)
AST: 16 IU/L (ref 0–40)
Albumin: 4 g/dL (ref 3.9–4.9)
Alkaline Phosphatase: 112 IU/L (ref 44–121)
BUN/Creatinine Ratio: 13 (ref 12–28)
BUN: 26 mg/dL (ref 8–27)
Bilirubin Total: 0.2 mg/dL (ref 0.0–1.2)
CO2: 19 mmol/L — ABNORMAL LOW (ref 20–29)
Calcium: 8.8 mg/dL (ref 8.7–10.3)
Chloride: 107 mmol/L — ABNORMAL HIGH (ref 96–106)
Creatinine, Ser: 2.07 mg/dL — ABNORMAL HIGH (ref 0.57–1.00)
Globulin, Total: 2.8 g/dL (ref 1.5–4.5)
Glucose: 129 mg/dL — ABNORMAL HIGH (ref 70–99)
Potassium: 4.6 mmol/L (ref 3.5–5.2)
Sodium: 142 mmol/L (ref 134–144)
Total Protein: 6.8 g/dL (ref 6.0–8.5)
eGFR: 25 mL/min/{1.73_m2} — ABNORMAL LOW (ref >59)

## 2023-05-19 MED ORDER — FREESTYLE LIBRE 3 PLUS SENSOR MISC: 11 refills | Status: DC

## 2023-05-19 MED ORDER — HYDRALAZINE HCL 25 MG PO TABS
25.0000 mg | ORAL_TABLET | Freq: Two times a day (BID) | ORAL | 0 refills | Status: DC
Start: 2023-05-19 — End: 2023-06-02

## 2023-05-19 NOTE — Progress Notes (Signed)
 Labs stable. Continue to follow up with [facility] for renal disease

## 2023-05-19 NOTE — Telephone Encounter (Signed)
   Call placed to patient to discuss blood pressure.  Her blood pressure this AM is elevated at 164/93/  Sje denies chest pain.  She reports decreased pain s/p steroid injection given yesterday in office by PCP.  Her blood sugars remain within normal limits for now.  Counseled patient on additional use of sliding scale as needed. Consider low dose hydralazine for additional BP control.  Awaiting recommendations from Dr. Wolfgang Phoenix who manages her HTN in the setting of advanced kidney disease.  Discussed with PCP. Patient to transition to libre 3 PLUS CGM.  Order sent to [facility] where patient has been filling Libre CGM.    Kieth Brightly, PharmD, BCACP, CPP Clinical Pharmacist, [facility]

## 2023-05-19 NOTE — Telephone Encounter (Signed)
 error

## 2023-05-19 NOTE — Telephone Encounter (Signed)
 Discussed with Brooke [name], will plan to add hydralazine 25 mg BID. Please have patient continue to monitior BP at home. Present to ED for worsening symptoms of confusion, lightheadedness, changes to vision. Patient to follow up with [name] next week. Please have her come in for appt with Triage RN for BP check on Monday or Tuesday. Hydralazine 25 mg BID sent to [facility].

## 2023-05-19 NOTE — Telephone Encounter (Signed)
 Patient aware of treatment plan. Patient scheduled for B/P check.

## 2023-05-22 NOTE — Telephone Encounter (Signed)
 Renewal submitted.   Please send new 90 day RX's for Humalog, Basglar, and Trulicity to [facility] for assistance.

## 2023-05-23 ENCOUNTER — Telehealth: Payer: Self-pay | Admitting: Pharmacist

## 2023-05-23 ENCOUNTER — Ambulatory Visit: Admitting: Family Medicine

## 2023-05-23 VITALS — BP 148/65 | HR 70

## 2023-05-23 DIAGNOSIS — I152 Hypertension secondary to endocrine disorders: Secondary | ICD-10-CM

## 2023-05-23 MED ORDER — INSULIN LISPRO (1 UNIT DIAL) 100 UNIT/ML (KWIKPEN): 10.0000 [IU] | PEN_INJECTOR | SUBCUTANEOUS | 5 refills | Status: DC

## 2023-05-23 MED ORDER — BASAGLAR KWIKPEN 100 UNIT/ML ~~LOC~~ SOPN: 37.0000 [IU] | PEN_INJECTOR | Freq: Every day | SUBCUTANEOUS | 3 refills | Status: DC

## 2023-05-23 MED ORDER — TRULICITY 4.5 MG/0.5ML ~~LOC~~ SOAJ: 4.5000 mg | SUBCUTANEOUS | 3 refills | Status: DC

## 2023-05-23 NOTE — Telephone Encounter (Signed)
 Refills sent to [facility]

## 2023-05-23 NOTE — Progress Notes (Signed)
 Patient comes in today for a BP check with the triage nurse. Bp taken in the left arm first reading was 151/66 with heart rate 72. Second reading was 148/65,

## 2023-05-24 ENCOUNTER — Telehealth: Payer: Self-pay

## 2023-05-24 ENCOUNTER — Other Ambulatory Visit (HOSPITAL_COMMUNITY): Payer: Self-pay

## 2023-05-24 NOTE — Telephone Encounter (Signed)
 Pharmacy Patient Advocate Encounter   Received notification from Onbase that prior authorization for FreeStyle Libre 3 Plus Sensor is required/requested.   Insurance verification completed.   The patient is insured through [employer] .   Per test claim: Refill too soon. PA is not needed at this time. Medication was filled 05/20/2023. Next eligible fill date is 06/10/2023.

## 2023-05-25 ENCOUNTER — Emergency Department (HOSPITAL_BASED_OUTPATIENT_CLINIC_OR_DEPARTMENT_OTHER)
Admission: EM | Admit: 2023-05-25 | Discharge: 2023-05-25 | Disposition: A | Discharge status: Home / Self Care | Attending: Emergency Medicine | Admitting: Emergency Medicine

## 2023-05-25 ENCOUNTER — Encounter (HOSPITAL_BASED_OUTPATIENT_CLINIC_OR_DEPARTMENT_OTHER): Payer: Self-pay | Admitting: Emergency Medicine

## 2023-05-25 ENCOUNTER — Other Ambulatory Visit: Payer: Self-pay

## 2023-05-25 DIAGNOSIS — Z7982 Long term (current) use of aspirin: Secondary | ICD-10-CM | POA: Insufficient documentation

## 2023-05-25 DIAGNOSIS — N189 Chronic kidney disease, unspecified: Secondary | ICD-10-CM | POA: Diagnosis not present

## 2023-05-25 DIAGNOSIS — R42 Dizziness and giddiness: Secondary | ICD-10-CM | POA: Diagnosis not present

## 2023-05-25 DIAGNOSIS — I129 Hypertensive chronic kidney disease with stage 1 through stage 4 chronic kidney disease, or unspecified chronic kidney disease: Secondary | ICD-10-CM | POA: Diagnosis not present

## 2023-05-25 DIAGNOSIS — Z794 Long term (current) use of insulin: Secondary | ICD-10-CM | POA: Insufficient documentation

## 2023-05-25 DIAGNOSIS — Z79899 Other long term (current) drug therapy: Secondary | ICD-10-CM | POA: Insufficient documentation

## 2023-05-25 DIAGNOSIS — I10 Essential (primary) hypertension: Secondary | ICD-10-CM

## 2023-05-25 LAB — CBC WITH DIFFERENTIAL/PLATELET
Abs Immature Granulocytes: 0.03 10*3/uL (ref 0.00–0.07)
Basophils Absolute: 0.1 10*3/uL (ref 0.0–0.1)
Basophils Relative: 1 %
Eosinophils Absolute: 0.9 10*3/uL — ABNORMAL HIGH (ref 0.0–0.5)
Eosinophils Relative: 8 %
HCT: 37 % (ref 36.0–46.0)
Hemoglobin: 11.6 g/dL — ABNORMAL LOW (ref 12.0–15.0)
Immature Granulocytes: 0 %
Lymphocytes Relative: 24 %
Lymphs Abs: 2.7 10*3/uL (ref 0.7–4.0)
MCH: 26.4 pg (ref 26.0–34.0)
MCHC: 31.4 g/dL (ref 30.0–36.0)
MCV: 84.3 fL (ref 80.0–100.0)
Monocytes Absolute: 0.6 10*3/uL (ref 0.1–1.0)
Monocytes Relative: 5 %
Neutro Abs: 6.9 10*3/uL (ref 1.7–7.7)
Neutrophils Relative %: 62 %
Platelets: 442 10*3/uL — ABNORMAL HIGH (ref 150–400)
RBC: 4.39 MIL/uL (ref 3.87–5.11)
RDW: 15.7 % — ABNORMAL HIGH (ref 11.5–15.5)
WBC: 11.2 10*3/uL — ABNORMAL HIGH (ref 4.0–10.5)
nRBC: 0 % (ref 0.0–0.2)

## 2023-05-25 LAB — BASIC METABOLIC PANEL
Anion gap: 7 (ref 5–15)
BUN: 29 mg/dL — ABNORMAL HIGH (ref 8–23)
CO2: 25 mmol/L (ref 22–32)
Calcium: 9.1 mg/dL (ref 8.9–10.3)
Chloride: 107 mmol/L (ref 98–111)
Creatinine, Ser: 2.27 mg/dL — ABNORMAL HIGH (ref 0.44–1.00)
GFR, Estimated: 23 mL/min — ABNORMAL LOW (ref >60)
Glucose, Bld: 77 mg/dL (ref 70–99)
Potassium: 4.4 mmol/L (ref 3.5–5.1)
Sodium: 139 mmol/L (ref 135–145)

## 2023-05-25 MED ORDER — HYDRALAZINE HCL 25 MG PO TABS
25.0000 mg | ORAL_TABLET | Freq: Once | ORAL | Status: AC
  Administered 2023-05-25: 25 mg via ORAL
  Filled 2023-05-25: qty 1

## 2023-05-25 NOTE — ED Notes (Addendum)
 Pt did not take her new BP med this morning... Told to hold until MD clears... Provider informed.Marland KitchenMarland Kitchen

## 2023-05-25 NOTE — Discharge Instructions (Addendum)
 Continue to take your BP medications and start keeping a log of your blood pressure.  It is quite possible that your symptoms are because of medication side effects. We recommend that you return to the ER if you start having dizziness or spinning sensation with associated nausea, one-sided weakness, numbness, slurring of your speech, double vision/loss of vision or difficulty in swallowing.  Keep the blood pressure log as requested and taken to your primary care doctor or renal doctor in 7 to 10 days.

## 2023-05-25 NOTE — Progress Notes (Addendum)
 Pharmacy Medication Assistance Program Note    06/07/2023  Patient ID: Brooke [name], female  DOB: July 03, 1955, 68 y.o.  MRN:  [id number]     05/12/2023  Outreach Medication Two  Manufacturer Medication Two [employer] Basaglar  Dose of Basaglar U100 [employer]  Type of Sport and exercise psychologist  Date Application Sent to Prescriber 05/12/2023  Date Application Received From Provider 05/19/2023  Method Application Sent to Manufacturer Fax  Date Application Submitted to Manufacturer 05/22/2023  Patient Assistance Determination Approved  Approval Start Date 05/22/2023        06/07/2023  Patient ID: Brooke [name], female  DOB: 04-Dec-1955, 68 y.o.  MRN:  [id number]     05/12/2023  Outreach Medication Three  Manufacturer Medication Three Lilly  [employer] Humalog  Dose of Humalog U100 KWIKPEN  Type of Radiographer, therapeutic Assistance  Date Application Sent to Prescriber 05/12/2023  Date Application Received From Provider 05/19/2023  Date Application Submitted to Manufacturer 05/22/2023  Method Application Sent to Manufacturer Fax  Patient Assistance Determination Approved  Approval Start Date 05/22/2023  Approval End Date 03/06/2024         06/07/2023  Patient ID: Brooke [name], female  DOB: March 11, 1955, 68 y.o.  MRN:  [id number]     05/12/2023  Outreach Medication Four  Manufacturer Medication Four Lilly  [employer] Trulicity  Type of Radiographer, therapeutic Assistance  Date Application Sent to Prescriber 05/12/2023  Date Application Received From Provider 05/19/2023  Date Application Submitted to Manufacturer 05/22/2023  Method Application Sent to Manufacturer Fax  Patient Assistance Determination Approved  Approval End Date 03/06/2024

## 2023-05-25 NOTE — ED Triage Notes (Signed)
 States BP was elevated this morning after waking up. Recently seen in ED for same and had BP meds adjusted. Denies CP or SHOB.

## 2023-05-25 NOTE — ED Notes (Signed)
 Discharge paperwork given and verbally understood.

## 2023-05-25 NOTE — ED Provider Notes (Signed)
 [place] EMERGENCY DEPARTMENT AT [facility] Provider Note   CSN: [id number] Arrival date & time: 05/25/23  4098     History  Chief Complaint  Patient presents with   Hypertension    Brooke [name] is a 68 y.o. female.  HPI    68 year old female comes in with chief complaint of elevated blood pressure.  Patient has history of CKD, hypertension.  Most recently, she has had some high blood pressure reads.  She is following up with her PCP and they have increased her lisinopril from 10-20 and then from 20 to 40 mg.  She was also started on hydralazine yesterday.  This morning patient woke up and just felt dizzy.  She specifically felt dizzy when she had her eyes closed, when she would open her eyes, she did not feel dizzy.  When patient walked, she felt that maybe she was slightly unsteady, she checked her BP, it was elevated and she decided to come to the ER.  She has not taken her new hydralazine that was started yesterday at this morning.  Review of system is negative for any one-sided weakness, numbness, slurred speech, vision loss, double vision, difficulty with speech or swallowing.   Home Medications Prior to Admission medications   Medication Sig Start Date End Date Taking? Authorizing Provider  acetaminophen (TYLENOL) 500 MG tablet Take 1,000 mg by mouth every 8 (eight) hours as needed for moderate pain.    [provider]  albuterol (PROVENTIL) (2.5 MG/3ML) 0.083% nebulizer solution Take 3 mLs (2.5 mg total) by nebulization every 6 (six) hours as needed for wheezing or shortness of breath. 03/23/23   [name], [name], FNP  albuterol (VENTOLIN HFA) 108 (90 Base) MCG/ACT inhaler Inhale 2 puffs into the lungs every 6 (six) hours as needed for wheezing or shortness of breath. 06/26/19   [name], FNP  amLODipine (NORVASC) 10 MG tablet Take 1 tablet (10 mg total) by mouth daily. 03/29/23   [name], [name], FNP  Ascorbic Acid (VITA-C PO)  Take 1 tablet by mouth 2 (two) times a week.    [provider]  aspirin EC 81 MG tablet Take 1 tablet (81 mg total) by mouth daily. Swallow whole. 12/16/22   Kendell Bane, MD  atenolol (TENORMIN) 100 MG tablet Take 1 tablet (100 mg total) by mouth every morning. 03/29/23   [name], [name], FNP  atorvastatin (LIPITOR) 80 MG tablet Take 1 tablet (80 mg total) by mouth daily. 04/18/23   [name], [name], FNP  Budeson-Glycopyrrol-Formoterol (BREZTRI AEROSPHERE) 160-9-4.8 MCG/ACT AERO Inhale 2 puffs into the lungs 2 (two) times daily. 04/25/23   [name], [name], FNP  carbamide peroxide (DEBROX) 6.5 % OTIC solution Place 5 drops into both ears 2 (two) times daily. 03/12/23   [name], MD  Cholecalciferol 25 MCG (1000 UT) capsule Take 1,000 Units by mouth every other day. 01/07/21   [provider]  Continuous Glucose Receiver (FREESTYLE LIBRE 3 READER) DEVI 1 Device by Does not apply route as directed. with compatible Freestyle Libre 3 sensor to monitor glucose continuously. DX:E11.65 09/01/22   [name], [name], FNP  Continuous Glucose Sensor (FREESTYLE LIBRE 3 PLUS SENSOR) MISC Change sensor every 15 days. DX: E11.65 05/19/23   [name], [name], FNP  Dulaglutide (TRULICITY) 4.5 MG/0.5ML SOAJ Inject 4.5 mg as directed once a week. 05/23/23   [name], [name], FNP  hydrALAZINE (APRESOLINE) 25 MG tablet Take 1 tablet (25 mg total) by mouth in the  morning and at bedtime. 05/19/23   [name], [name], FNP  Insulin Glargine (BASAGLAR KWIKPEN) 100 UNIT/ML Inject 37 Units into the skin daily. 05/23/23   [name], [name], FNP  insulin lispro (HUMALOG KWIKPEN) 100 UNIT/ML KwikPen Inject 10-15 Units into the skin See admin instructions. 10 units WITH BREAKFAST AND LUNCH, TAKE 15 units at [facility] 05/23/23   [name], [name], FNP  Insulin Pen Needle 31G X 6 MM MISC Use to inject insulin 4 times daily as directed. DX: E11.65 04/03/23    [name], FNP  Lancet Device MISC Check BG prn 03/29/23   [name], [name], FNP  lisinopril (ZESTRIL) 10 MG tablet Take 20 mg by mouth daily. 03/22/22   [provider]  Multiple Vitamin (MULTIVITAMIN ADULT PO) Take 1 tablet by mouth every other day.    [provider]  Nebulizer MISC USE AS DIRECTED 03/23/23   [provider]      Allergies    Jardiance [empagliflozin], Kerendia [finerenone], and Metformin and related    Review of Systems   Review of Systems  All other systems reviewed and are negative.   Physical Exam Updated Vital Signs BP (!) 169/87   Pulse 70   Temp 97.8 F (36.6 C) (Oral)   Resp 20   Ht 5\' 5"  (1.651 m)   Wt 98 kg   LMP 03/16/2012   SpO2 100%   BMI 35.95 kg/m  Physical Exam Vitals and nursing note reviewed.  Constitutional:      Appearance: She is well-developed.  HENT:     Head: Atraumatic.  Eyes:     Extraocular Movements: Extraocular movements intact.     Pupils: Pupils are equal, round, and reactive to light.  Cardiovascular:     Rate and Rhythm: Normal rate.  Pulmonary:     Effort: Pulmonary effort is normal.  Musculoskeletal:     Cervical back: Normal range of motion and neck supple.  Skin:    General: Skin is warm and dry.  Neurological:     Mental Status: She is alert and oriented to person, place, and time.     Cranial Nerves: No cranial nerve deficit.     Sensory: No sensory deficit.     Motor: No weakness.     Coordination: Coordination normal.     Gait: Gait normal.     Comments: Peripheral visual fields intact, no nystagmus     ED Results / Procedures / Treatments   Labs (all labs ordered are listed, but only abnormal results are displayed) Labs Reviewed  BASIC METABOLIC PANEL - Abnormal; Notable for the following components:      Result Value   BUN 29 (*)    Creatinine, Ser 2.27 (*)    GFR, Estimated 23 (*)    All other components within normal limits  CBC WITH  DIFFERENTIAL/PLATELET - Abnormal; Notable for the following components:   WBC 11.2 (*)    Hemoglobin 11.6 (*)    RDW 15.7 (*)    Platelets 442 (*)    Eosinophils Absolute 0.9 (*)    All other components within normal limits    EKG EKG Interpretation Date/Time:  Thursday May 25 2023 09:22:32 EDT Ventricular Rate:  70 PR Interval:  221 QRS Duration:  93 QT Interval:  419 QTC Calculation: 453 R Axis:   46  Text Interpretation: Sinus rhythm Prolonged PR interval Low voltage, precordial leads Borderline T abnormalities, anterior leads Confirmed by Derwood Kaplan ([id number]) on 05/25/2023 10:55:14 AM  Radiology No results found.  Procedures Procedures    Medications Ordered in ED Medications  hydrALAZINE (APRESOLINE) tablet 25 mg (25 mg Oral Given 05/25/23 1044)    ED Course/ Medical Decision Making/ A&P                                 Medical Decision Making Amount and/or Complexity of Data Reviewed Labs: ordered.  Risk Prescription drug management.   This patient presents to the ED with chief complaint(s) of dizziness, elevated blood pressure with pertinent past medical history of diabetes, CKD, hyperlipidemia, stroke (recovered, which she had vision loss with it) and hypertension for which PCP is actively adjusting her meds.The complaint involves an extensive differential diagnosis and also carries with it a high risk of complications and morbidity.    The differential diagnosis includes : Hypertensive urgency, hypertensive emergency, brain bleed, TIA, electrolyte abnormality and medication side effects.   Additional history obtained: Records reviewed Primary Care Documents  Patient has normal and reassuring neurologic exam, visual exam and her gait was normal.  Patient states that she feels a lot better now than she did when she first woke up.  I suspect that her symptoms are because of adjustments to her BP medications.  Hydralazine has side effect of dizziness.   Her lisinopril was increased from 10-20 and now 20 to 40 mg as well.  Initial plan is to get basic labs only and monitor the patient.  I do not think CT scan of the brain is needed.  I also do not think patient had a stroke, an MRI is not indicated.  We will still reassess her.  Independent labs interpretation:  The following labs were independently interpreted: CBC, metabolic profile are normal. Patient has CKD that is at baseline.  Treatment and Reassessment: Patient reassessed.  Results of the ED workup discussed with sitter.  She continues to feel well.  I do not think we need to proceed with MRI right now. Discussed with her that my suspicion is most likely she is having side effects from her BP medication changes, and initiation of hydralazine. Advised hydration.  Advised BP log usage and taking the readings to her PCP in 5 to 7 days.  Return precautions for stroke also discussed.   Consideration for admission or further workup: CT scan of the brain, MRI of the brain were considered, but thought to be less helpful in the setting of extreme low pretest probability for stroke and bleed.  Final Clinical Impression(s) / ED Diagnoses Final diagnoses:  Chronic kidney disease, unspecified CKD stage  Uncontrolled hypertension  Dizziness    Rx / DC Orders ED Discharge Orders     None         Derwood Kaplan, MD 05/25/23 1259

## 2023-05-26 ENCOUNTER — Encounter: Payer: Self-pay | Admitting: Family Medicine

## 2023-05-26 ENCOUNTER — Ambulatory Visit: Admitting: Family Medicine

## 2023-05-26 VITALS — BP 144/74 | HR 71 | Temp 98.3°F | Ht 65.0 in | Wt 215.0 lb

## 2023-05-26 DIAGNOSIS — I152 Hypertension secondary to endocrine disorders: Secondary | ICD-10-CM

## 2023-05-26 DIAGNOSIS — E1159 Type 2 diabetes mellitus with other circulatory complications: Secondary | ICD-10-CM | POA: Diagnosis not present

## 2023-05-26 DIAGNOSIS — J410 Simple chronic bronchitis: Secondary | ICD-10-CM

## 2023-05-26 DIAGNOSIS — Z7985 Long-term (current) use of injectable non-insulin antidiabetic drugs: Secondary | ICD-10-CM

## 2023-05-26 MED ORDER — ALBUTEROL SULFATE HFA 108 (90 BASE) MCG/ACT IN AERS: 2.0000 | INHALATION_SPRAY | Freq: Four times a day (QID) | RESPIRATORY_TRACT | 2 refills | Status: DC | PRN

## 2023-05-26 NOTE — Progress Notes (Signed)
 Subjective:  Patient ID: Brooke [name], female    DOB: 02-23-1956, 68 y.o.   MRN: [id number]  Patient Care Team: [name], FNP as PCP - General (Family Medicine) Danella Maiers, [facility] (Pharmacist) Delora Fuel, [place] (Optometry) Dione Booze, Bertram Millard, MD as Consulting Physician (Ophthalmology) Randa Lynn, MD as Referring Physician (Nephrology)   Chief Complaint:  Hypertension (B/P follow up)  HPI: Brooke [name] is a 68 y.o. female presenting on 05/26/2023 for Hypertension (B/P follow up)  Patient presented to ED yesterday for HTN. She was provided with dose of hydralazine 25 mg and instructed to follow up with PCP. She was recently started on hydralazine and started taking it 4 days ago. She is taking one in morning and one at night. Taking lisinopril 40 mg. Continues to take amlodipine and atenolol.  Has BP monitor at home Yes BP at home average 140-180s/80-100 Currently denies anxiety, fatigue, peripheral edema, changes to vision, chest pain, headaches, palpitations, sweats, SOB, PND, orthopnea CAD risks hypertension Brought in her wrist cuff from home to compare to readings in office.  141/78 on home monitor.   Relevant past medical, surgical, family, and social history reviewed and updated as indicated.  Allergies and medications reviewed and updated. Data reviewed: Chart in Epic.   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   Hypertension    Hyperuricemia 11/27/2020   Obesity (BMI 35.0-39.9 without comorbidity)    Renal mass 08/04/2021   Vertigo    Vitamin D insufficiency 11/27/2020    Past Surgical History:  Procedure Laterality Date   BREAST SURGERY Right 1974    OPERATIVE ULTRASOUND N/A 08/04/2021   Procedure: OPERATIVE ULTRASOUND;  Surgeon: Sebastian Ache, MD;  Location: [facility];  Service: Urology;  Laterality: N/A;   ROBOTIC ASSITED PARTIAL NEPHRECTOMY Right 08/04/2021   Procedure: XI ROBOTIC ASSITED RADICAL NEPHRECTOMY;  Surgeon: Sebastian Ache, MD;  Location: [facility];  Service: Urology;  Laterality: Right;    Social History   Socioeconomic History   Marital status: Married    Spouse name: Donnie   Number of children: 1   Years of education: Not on file   Highest education level: Not on file  Occupational History   Not on file  Tobacco Use   Smoking status: Never   Smokeless tobacco: Never  Vaping Use   Vaping status: Never Used  Substance and Sexual Activity   Alcohol use: No   Drug use: No   Sexual activity: Never  Other Topics Concern   Not on file  Social History Narrative   1 daughter    1 grandson      Lives with husband    Retired    Chief Executive Officer Drivers of Corporate investment banker 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  (04/28/2023)   Overall Financial Resource Strain (CARDIA)    Difficulty of Paying Living Expenses: Not hard at all  Food Insecurity: No Food Insecurity (04/28/2023)   Hunger Vital Sign    Worried About Running Out of Food in the Last Year: Never true    Ran Out of Food in the Last Year: Never true  Transportation Needs: No Transportation Needs (04/28/2023)  PRAPARE - Administrator, Civil Service (Medical): No    Lack of Transportation (Non-Medical): No  Physical Activity: Inactive (04/28/2023)   Exercise Vital Sign    Days of Exercise per Week: 0 days    Minutes of Exercise per Session: 0 min  Stress: No Stress Concern Present (04/28/2023)   [employer] of Occupational Health - Occupational Stress Questionnaire    Feeling of Stress : Not at all  Social Connections: Moderately Integrated (04/28/2023)   Social Connection and Isolation Panel [NHANES]    Frequency of Communication with Friends and Family:  More than three times a week    Frequency of Social Gatherings with Friends and Family: Three times a week    Attends Religious Services: More than 4 times per year    Active Member of Clubs or Organizations: No    Attends Banker Meetings: Never    Marital Status: Married  Catering manager Violence: Not At Risk (04/28/2023)   Humiliation, Afraid, Rape, and Kick questionnaire    Fear of Current or Ex-Partner: No    Emotionally Abused: No    Physically Abused: No    Sexually Abused: No    Outpatient Encounter Medications as of 05/26/2023  Medication Sig   acetaminophen (TYLENOL) 500 MG tablet Take 1,000 mg by mouth every 8 (eight) hours as needed for moderate pain.   albuterol (PROVENTIL) (2.5 MG/3ML) 0.083% nebulizer solution Take 3 mLs (2.5 mg total) by nebulization every 6 (six) hours as needed for wheezing or shortness of breath.   albuterol (VENTOLIN HFA) 108 (90 Base) MCG/ACT inhaler Inhale 2 puffs into the lungs every 6 (six) hours as needed for wheezing or shortness of breath.   amLODipine (NORVASC) 10 MG tablet Take 1 tablet (10 mg total) by mouth daily.   Ascorbic Acid (VITA-C PO) Take 1 tablet by mouth 2 (two) times a week.   aspirin EC 81 MG tablet Take 1 tablet (81 mg total) by mouth daily. Swallow whole.   atenolol (TENORMIN) 100 MG tablet Take 1 tablet (100 mg total) by mouth every morning.   atorvastatin (LIPITOR) 80 MG tablet Take 1 tablet (80 mg total) by mouth daily.   Budeson-Glycopyrrol-Formoterol (BREZTRI AEROSPHERE) 160-9-4.8 MCG/ACT AERO Inhale 2 puffs into the lungs 2 (two) times daily.   carbamide peroxide (DEBROX) 6.5 % OTIC solution Place 5 drops into both ears 2 (two) times daily.   Cholecalciferol 25 MCG (1000 UT) capsule Take 1,000 Units by mouth every other day.   Continuous Glucose Receiver (FREESTYLE LIBRE 3 READER) DEVI 1 Device by Does not apply route as directed. with compatible Freestyle Libre 3 sensor to monitor glucose continuously.  DX:E11.65   Continuous Glucose Sensor (FREESTYLE LIBRE 3 PLUS SENSOR) MISC Change sensor every 15 days. DX: E11.65   Dulaglutide (TRULICITY) 4.5 MG/0.5ML SOAJ Inject 4.5 mg as directed once a week.   hydrALAZINE (APRESOLINE) 25 MG tablet Take 1 tablet (25 mg total) by mouth in the morning and at bedtime.   Insulin Glargine (BASAGLAR KWIKPEN) 100 UNIT/ML Inject 37 Units into the skin daily.   insulin lispro (HUMALOG KWIKPEN) 100 UNIT/ML KwikPen Inject 10-15 Units into the skin See admin instructions. 10 units WITH BREAKFAST AND LUNCH, TAKE 15 units at [facility]   Insulin Pen Needle 31G X 6 MM MISC Use to inject insulin 4 times daily as directed. DX: E11.65   Lancet Device MISC Check BG prn   lisinopril (ZESTRIL) 10 MG tablet Take 20 mg by mouth daily.  Multiple Vitamin (MULTIVITAMIN ADULT PO) Take 1 tablet by mouth every other day.   Nebulizer MISC USE AS DIRECTED   No facility-administered encounter medications on file as of 05/26/2023.    Allergies  Allergen Reactions   Jardiance [Empagliflozin]     Holding per nephro, pt reports no allergy, Dr just didn't want pt taking it   Chauncey Mann [Finerenone]     Holding per nephro, pt reports no allergy, Dr just didn't want pt taking it   Metformin And Related     Contraindicated due to GFR; GI side effects Pt states not a real allergy    Review of Systems As per HPI  Objective:  BP (!) 144/74   Pulse 71   Temp 98.3 F (36.8 C)   Ht 5\' 5"  (1.651 m)   Wt 215 lb (97.5 kg)   LMP 03/16/2012   SpO2 96%   BMI 35.78 kg/m    Wt Readings from Last 3 Encounters:  05/26/23 215 lb (97.5 kg)  05/25/23 216 lb 0.8 oz (98 kg)  05/18/23 217 lb (98.4 kg)   Physical Exam Constitutional:      General: She is awake. She is not in acute distress.    Appearance: Normal appearance. She is well-developed and well-groomed. She is morbidly obese. She is not ill-appearing, toxic-appearing or diaphoretic.  Cardiovascular:     Rate and Rhythm: Normal rate and  regular rhythm.     Pulses: Normal pulses.          Radial pulses are 2+ on the right side and 2+ on the left side.       Posterior tibial pulses are 2+ on the right side and 2+ on the left side.     Heart sounds: Normal heart sounds. No murmur heard.    No gallop.  Pulmonary:     Effort: Pulmonary effort is normal. No respiratory distress.     Breath sounds: No stridor. Examination of the right-lower field reveals wheezing. Examination of the left-lower field reveals wheezing. Wheezing present. No rhonchi or rales.  Musculoskeletal:     Cervical back: Full passive range of motion without pain and neck supple.     Right lower leg: No edema.     Left lower leg: No edema.  Skin:    General: Skin is warm.     Capillary Refill: Capillary refill takes less than 2 seconds.  Neurological:     General: No focal deficit present.     Mental Status: She is alert, oriented to person, place, and time and easily aroused. Mental status is at baseline.     GCS: GCS eye subscore is 4. GCS verbal subscore is 5. GCS motor subscore is 6.     Motor: No weakness.  Psychiatric:        Attention and Perception: Attention and perception normal.        Mood and Affect: Mood and affect normal.        Speech: Speech normal.        Behavior: Behavior normal. Behavior is cooperative.        Thought Content: Thought content normal. Thought content does not include homicidal or suicidal ideation. Thought content does not include homicidal or suicidal plan.        Cognition and Memory: Cognition and memory normal.        Judgment: Judgment normal.     Results for orders placed or performed during the hospital encounter of 05/25/23  Basic metabolic panel  Collection Time: 05/25/23 10:46 AM  Result Value Ref Range   Sodium 139 135 - 145 mmol/L   Potassium 4.4 3.5 - 5.1 mmol/L   Chloride 107 98 - 111 mmol/L   CO2 25 22 - 32 mmol/L   Glucose, Bld 77 70 - 99 mg/dL   BUN 29 (H) 8 - 23 mg/dL   Creatinine, Ser  4.33 (H) 0.44 - 1.00 mg/dL   Calcium 9.1 8.9 - 29.5 mg/dL   GFR, Estimated 23 (L) >60 mL/min   Anion gap 7 5 - 15  CBC with Differential   Collection Time: 05/25/23 10:46 AM  Result Value Ref Range   WBC 11.2 (H) 4.0 - 10.5 K/uL   RBC 4.39 3.87 - 5.11 MIL/uL   Hemoglobin 11.6 (L) 12.0 - 15.0 g/dL   HCT 18.8 41.6 - 60.6 %   MCV 84.3 80.0 - 100.0 fL   MCH 26.4 26.0 - 34.0 pg   MCHC 31.4 30.0 - 36.0 g/dL   RDW 30.1 (H) 60.1 - 09.3 %   Platelets 442 (H) 150 - 400 K/uL   nRBC 0.0 0.0 - 0.2 %   Neutrophils Relative % 62 %   Neutro Abs 6.9 1.7 - 7.7 K/uL   Lymphocytes Relative 24 %   Lymphs Abs 2.7 0.7 - 4.0 K/uL   Monocytes Relative 5 %   Monocytes Absolute 0.6 0.1 - 1.0 K/uL   Eosinophils Relative 8 %   Eosinophils Absolute 0.9 (H) 0.0 - 0.5 K/uL   Basophils Relative 1 %   Basophils Absolute 0.1 0.0 - 0.1 K/uL   Immature Granulocytes 0 %   Abs Immature Granulocytes 0.03 0.00 - 0.07 K/uL       05/26/2023    2:10 PM 05/18/2023   11:07 AM 04/28/2023    4:04 PM 03/29/2023    3:09 PM 03/22/2023   11:43 AM  Depression screen PHQ 2/9  Decreased Interest 0 0 0 0 0  Down, Depressed, Hopeless 0 0 0 0 0  PHQ - 2 Score 0 0 0 0 0  Altered sleeping   0  0  Tired, decreased energy   0  0  Change in appetite   0  0  Trouble concentrating   0  0  Moving slowly or fidgety/restless   0  0  Suicidal thoughts   0  0  PHQ-9 Score   0  0       05/26/2023    2:10 PM 05/18/2023   11:07 AM 03/29/2023    3:09 PM 03/22/2023   11:43 AM  GAD 7 : Generalized Anxiety Score  Nervous, Anxious, on Edge 0 0 0 0  Control/stop worrying 0 0 0 0  Worry too much - different things 0 0 0 0  Trouble relaxing 0 0 0 0  Restless 0 0 0 0  Easily annoyed or irritable 0 0 0 0  Afraid - awful might happen 0 0  0  Total GAD 7 Score 0 0  0  Anxiety Difficulty Not difficult at all Not difficult at all Not difficult at all Not difficult at all   Pertinent labs & imaging results that were available during my care of  the patient were reviewed by me and considered in my medical decision making.  Assessment & Plan:  [place] was seen today for hypertension.  Diagnoses and all orders for this visit:  Hypertension associated with diabetes [facility]) Reviewed notes from [place], MD 05/25/23.  Patient symptoms have resolved.  BP has improved. Remains above goal. Will not make changes to medications at this time. Patient has follow up with pharmacist on Tuesday. Will continue to monitor BP over the weekend and review measurements with pharmacy. Will await to determine next steps.   Simple chronic bronchitis (HCC) Patient continues to wheeze on exam. Declines referral to pulmonology at this time. She states that she has an outdated inhaler at home. Refill provided as below.  -     albuterol (VENTOLIN HFA) 108 (90 Base) MCG/ACT inhaler; Inhale 2 puffs into the lungs every 6 (six) hours as needed for wheezing or shortness of breath.  Continue all other maintenance medications.  Follow up plan: Return for w/ pharmacy on Tues.   Continue healthy lifestyle choices, including diet (rich in fruits, vegetables, and lean proteins, and low in salt and simple carbohydrates) and exercise (at least 30 minutes of moderate physical activity daily).  Written and verbal instructions provided   The above assessment and management plan was discussed with the patient. The patient verbalized understanding of and has agreed to the management plan. Patient is aware to call the clinic if they develop any new symptoms or if symptoms persist or worsen. Patient is aware when to return to the clinic for a follow-up visit. Patient educated on when it is appropriate to go to the emergency department.   Neale Burly, DNP-FNP [facility] [address] [place], [place] [address] [phone]

## 2023-05-30 ENCOUNTER — Other Ambulatory Visit (INDEPENDENT_AMBULATORY_CARE_PROVIDER_SITE_OTHER)

## 2023-05-30 DIAGNOSIS — Z794 Long term (current) use of insulin: Secondary | ICD-10-CM

## 2023-05-30 DIAGNOSIS — Z7985 Long-term (current) use of injectable non-insulin antidiabetic drugs: Secondary | ICD-10-CM

## 2023-05-30 DIAGNOSIS — E1159 Type 2 diabetes mellitus with other circulatory complications: Secondary | ICD-10-CM

## 2023-05-30 DIAGNOSIS — I152 Hypertension secondary to endocrine disorders: Secondary | ICD-10-CM

## 2023-05-30 MED ORDER — LISINOPRIL 40 MG PO TABS: 40.0000 mg | ORAL_TABLET | Freq: Every day | ORAL | 3 refills | Status: AC

## 2023-05-30 NOTE — Progress Notes (Unsigned)
   05/30/2023 Name: Brooke [name] MRN: [id number] DOB: 1955-07-23  No chief complaint on file.   {Visit Type:26650}   Subjective:  Care Team: Primary Care Provider: [name], FNP ; Next Scheduled Visit: *** {careteamprovider:27366}  Medication Access/Adherence  Current Pharmacy:  [facility] [address] [place] - [address] [place] [address] Phone: [phone] Fax: [phone]  [facility] - [place], [place] - [address] [place] [address] Phone: [phone] Fax: [phone]  [facility] - [place], [place] - [address] [place] [address] Phone: [phone] Fax: [phone]  [facility] - [place], [place] - [address] [place] [address] [place] [address] Phone: [phone] Fax: [phone]  [facility] [place], [place] - [address] [place] [address] Phone: [phone] Fax: [phone]  [facility] [place], [place] - [address] [place] [address] Phone: [phone] Fax: [phone]   Patient reports affordability concerns with their medications: {YES/NO:21197} Patient reports access/transportation concerns to their pharmacy: {YES/NO:21197} Patient reports adherence concerns with their medications:  {YES/NO:21197} ***  150/82 149/80 155/91 171/76 170/98 147/82 164/104 121/81 153/79 140/78 144/81 157/82 Wrist cuff but matches our readings here Increased hydral to 25 TID Pt compliant Denies chest pain   {Pharmacy S/O Choices:26420}   Objective:  Lab Results  Component Value Date   HGBA1C 7.7 (H) 03/29/2023    Lab Results  Component Value Date   CREATININE 2.27 (H) 05/25/2023   BUN 29 (H) 05/25/2023   NA 139 05/25/2023   K 4.4 05/25/2023   CL 107 05/25/2023    CO2 25 05/25/2023    Lab Results  Component Value Date   CHOL 155 03/29/2023   HDL 44 03/29/2023   LDLCALC 74 03/29/2023   TRIG 227 (H) 03/29/2023   CHOLHDL 3.5 03/29/2023    Medications Reviewed Today   Medications were not reviewed in this encounter       Assessment/Plan:   Hypertension: - Currently uncontrolled - Reviewed long term cardiovascular and renal outcomes of uncontrolled blood pressure - Reviewed appropriate blood pressure monitoring technique and reviewed goal blood pressure. Recommended to check home blood pressure and heart rate . - Recommend to ***     Follow Up Plan: ***  ***

## 2023-06-01 ENCOUNTER — Ambulatory Visit: Attending: Family Medicine

## 2023-06-01 VITALS — BP 167/81

## 2023-06-01 DIAGNOSIS — M25611 Stiffness of right shoulder, not elsewhere classified: Secondary | ICD-10-CM | POA: Insufficient documentation

## 2023-06-01 DIAGNOSIS — M25511 Pain in right shoulder: Secondary | ICD-10-CM | POA: Insufficient documentation

## 2023-06-01 DIAGNOSIS — G8929 Other chronic pain: Secondary | ICD-10-CM | POA: Diagnosis not present

## 2023-06-01 NOTE — Therapy (Addendum)
 OUTPATIENT PHYSICAL THERAPY UPPER EXTREMITY EVALUATION   Patient Name: Brooke [name] MRN: [id number] DOB:01/09/1956, 68 y.o., female Today's Date: 06/01/2023  END OF SESSION:  PT End of Session - 06/01/23 1728     Visit Number 1    Number of Visits 12    Date for PT Re-Evaluation 08/04/23    PT Start Time 1312    PT Stop Time 1415    PT Time Calculation (min) 63 min    Activity Tolerance Patient tolerated treatment well    Behavior During Therapy [facility] for tasks assessed/performed             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   Hypertension    Hyperuricemia 11/27/2020   Obesity (BMI 35.0-39.9 without comorbidity)    Renal mass 08/04/2021   Vertigo    Vitamin D insufficiency 11/27/2020   Past Surgical History:  Procedure Laterality Date   BREAST SURGERY Right 1974   OPERATIVE ULTRASOUND N/A 08/04/2021   Procedure: OPERATIVE ULTRASOUND;  Surgeon: Sebastian Ache, MD;  Location: [facility];  Service: Urology;  Laterality: N/A;   ROBOTIC ASSITED PARTIAL NEPHRECTOMY Right 08/04/2021   Procedure: XI ROBOTIC ASSITED RADICAL NEPHRECTOMY;  Surgeon: Sebastian Ache, MD;  Location: [facility];  Service: Urology;  Laterality: Right;   Patient Active Problem List   Diagnosis Date Noted   Long-term (current) use of injectable non-insulin antidiabetic drugs 03/29/2023   Acute ischemic stroke (HCC) 12/14/2022   CKD (chronic kidney disease) stage 4, GFR 15-29 ml/min (HCC) 12/14/2022   Thrombocytosis 12/14/2022   Shortness of breath 12/14/2022   Chronic pain of right knee 01/26/2022   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   Thickened endometrium 03/24/2021   Vaginal bleeding 03/24/2021   Difficulty  sleeping 02/10/2021   Positive colorectal cancer screening using Cologuard test 12/10/2020   Vitamin D insufficiency 11/27/2020   Diabetic nephropathy (HCC)    CKD stage 3 due to type 2 diabetes mellitus (HCC) 03/14/2019   Type 2 diabetes mellitus with hyperglycemia, with long-term current use of insulin (HCC) 03/13/2019   Hyperlipidemia associated with type 2 diabetes mellitus (HCC) 03/13/2019   Hypertension associated with diabetes (HCC) 03/13/2019   Obesity (BMI 30-39.9) 03/13/2019   Chronic bronchitis (HCC) 03/13/2019   REFERRING PROVIDER: [name], FNP   REFERRING DIAG: Chronic right shoulder pain   THERAPY DIAG:  Acute pain of right shoulder  Stiffness of right shoulder, not elsewhere classified  Rationale for Evaluation and Treatment: Rehabilitation  ONSET DATE: 3 and a half weeks ago   SUBJECTIVE:  SUBJECTIVE STATEMENT: Patient reported bilateral shoulder pain that has been present for 3.5 weeks. She stated that the left shoulder pain is almost nonexistent at this point and the right shoulder feels better than at initial onset. Her pain is throbbing and achy (5/10) is present consistently throughout the day and is an 8/10 at its worst. Her pain limits her ability to perform all ADL's. She is able to sleep 5 to 6 hours every night but pain/ stiffness wakes her up in the morning. She reported shoulder weakness that limits her ability to cook and perform household chores.  Hand dominance: Right  PERTINENT HISTORY: Hypertension, obesity, history of a CVA, CKD, arthritis, and diabetes  PAIN:  Are you having pain? Yes: NPRS scale: 6/10 Pain location: Right shoulder  Pain description: Throbbing and sore Aggravating factors: Reaching behind her back, reaching to her other shoulder,  performing household chores like vacuuming, sweeping and cleaning  Relieving factors: Resting by her side, heat, ice   PRECAUTIONS: None  RED FLAGS: None   WEIGHT BEARING RESTRICTIONS: No  FALLS:  Has patient fallen in last 6 months? No  LIVING ENVIRONMENT: Lives with: lives with their spouse Lives in: Mobile home Stairs: Yes: External: 4 steps; none Has following equipment at home: None  OCCUPATION: Retired  PLOF: Needs assistance with homemaking  PATIENT GOALS: Decrease pain, less difficulty with work activities, sleep longer, improve movement, improve strength, less difficulty with home activities  NEXT MD VISIT: 06/24/23  OBJECTIVE:  Note: Objective measures were completed at Evaluation unless otherwise noted.  DIAGNOSTIC FINDINGS:  BP: 167/81 assessed in left arm while sitting  PATIENT SURVEYS :  Quick Dash 43.18  COGNITION: Overall cognitive status: Within functional limits for tasks assessed     SENSATION: WFL  POSTURE: No deficits observed   UPPER EXTREMITY ROM:   Active ROM Right eval Left eval  Shoulder flexion 138 sore when raising her arm   PROM: no limitation and nonpainful 130   Shoulder extension    Shoulder abduction 138; "pressure" when overpressure was provided   Firm end feel with PROM 175  Shoulder adduction    Shoulder internal rotation To S1 To L5  Shoulder external rotation To C7; familiar pain To T3   Shoulder Horizontal Adduction  15 degrees limited by pain and joint crepitus at [facility] joint with PROM    Elbow flexion    Elbow extension    Wrist flexion    Wrist extension    Wrist ulnar deviation    Wrist radial deviation    Wrist pronation    Wrist supination    (Blank rows = not tested)    UPPER EXTREMITY MMT:   MMT Right eval Left eval  Shoulder flexion 3+/5 4/5  Shoulder extension    Shoulder abduction 4-/5 4-/5  Shoulder adduction 3-/5 4+/5  Shoulder internal rotation 3/5 3/5  Shoulder external rotation 3/5  3/5  Middle trapezius    Lower trapezius    Elbow flexion    Elbow extension    Wrist flexion    Wrist extension    Wrist ulnar deviation    Wrist radial deviation    Wrist pronation    Wrist supination    Grip strength (lbs)    (Blank rows = not tested)  SHOULDER SPECIAL TESTS: Instability tests: Apprehension test: positive   JOINT MOBILITY TESTING:  Left GH PAM: WFL and nonpainful  Left AC PAM: WFL and nonpainful   Right AC PAM: WFL and nonpainful  Right  GH PAM: Posterior glide painful     PALPATION:  No findings reported with palpation of the AC joint.   Pain and tenderness reported with palpation of the GH joint, upper traps, LH Biceps tendon, and Deltoid muscle.                                                                                                                              TREATMENT DATE: 06/01/2023   PATIENT EDUCATION: Education details: POC, objective findings, goals for physical therapy, and prognosis Person educated: Patient Education method: Explanation Education comprehension: verbalized understanding  HOME EXERCISE PROGRAM:   ASSESSMENT:  CLINICAL IMPRESSION:  Patient is a 68 y.o. woman who was seen today for physical therapy evaluation and treatment for right shoulder pain. Her signs and symptoms are consistent with anterior instability of the shoulder, due to right shoulder muscle weakness and guarding, and GH joint stiffness. Notable evaluation findings include GH joint stiffness, limited shoulder IR and ER AROM, positive apprehension and relocation test, pain and joint crepitus with horizontal adduction PROM,  and MMT testing revealed bilateral muscle weakness with shoulder IR, ER, and adduction. Patient would benefit form skilled PT in order to increase mobility, strength, and stability while decreasing pain in order to improve her ability to perform ADL's.    OBJECTIVE IMPAIRMENTS:  decreased activity tolerance, decreased mobility,  decreased ROM, decreased strength, hypomobility, impaired UE functional use, and pain.   ACTIVITY LIMITATIONS: carrying, lifting, sleeping, stairs, bed mobility, and reach over head  PARTICIPATION LIMITATIONS: meal prep, cleaning, and yard work  PERSONAL FACTORS: Past/current experiences and 3+ comorbidities: Hypertension, obesity, history of a CVA, CKD, arthritis, and diabetes  are also affecting patient's functional outcome.   REHAB POTENTIAL: Good  CLINICAL DECISION MAKING: Evolving/moderate complexity  EVALUATION COMPLEXITY: Moderate  GOALS: Goals reviewed with patient? Yes  SHORT TERM GOALS: Target date: 06/22/23  In 4 weeks, the patient's shoulder pain will decrease from 8/10 to 6/10 which will aide in her ability to cook food independently.  Baseline: Goal status: INITIAL  2.  In 4 weeks, patient's shoulder ER mobility will increase to at least T3 which will allow her to have improved tolerance with donning/ doffing a shirt.  Baseline:  Goal status: INITIAL  3.   Patient will be independent with her initial HEP. Baseline:  Goal status: INITIAL   LONG TERM GOALS: Target date: 07/13/23  Patient's shoulder pain will decrease from 6/10 to 4/10 which will allow for improved ability to perform household chores. Baseline:  Goal status: INITIAL  2.  In 8 weeks, the patient's Quick Dash score will improve from 43.18% to 33%, indicating improved tolerance for recreational activities that involve shoulder movement.  Baseline:  Goal status: INITIAL  3.  Patient will be independent with her advanced HEP. Baseline:  Goal status: INITIAL  PLAN: PT FREQUENCY: 2x/week  PT DURATION: 6 weeks  PLANNED INTERVENTIONS: 97164- PT Re-evaluation, 97110-Therapeutic exercises, 97530- Therapeutic activity, [id number]- Neuromuscular  re-education, [phone]- Self Care, 56433- Manual therapy, G0283- Electrical stimulation (unattended), 97016- Vasopneumatic device, Patient/Family education, Taping,  Joint mobilization, Cryotherapy, and Moist heat  PLAN FOR NEXT SESSION: UBE, Shoulder PROM, AAROM, GH Joint mobilizations, RC and Trap muscle soft tissue mobilizations, Modalities as needed.    Jamea Robicheaux, Student-PT 06/01/2023, 5:55 PM   Today's evaluation was completed with direct 1:1 supervision by Candi Leash, PT, DPT

## 2023-06-01 NOTE — Addendum Note (Signed)
 Addended by: Granville Lewis on: 06/01/2023 07:07 PM   Modules accepted: Orders

## 2023-06-02 MED ORDER — HYDRALAZINE HCL 25 MG PO TABS: 25.0000 mg | ORAL_TABLET | Freq: Two times a day (BID) | ORAL | 0 refills | Status: DC

## 2023-06-02 NOTE — Telephone Encounter (Signed)
 Discussed HTN/BP with PCP See separate pharmD note Patient now on increased lisinopril to 40mg  daily, amlodipine, hydralazine added, atenolol

## 2023-06-06 ENCOUNTER — Telehealth: Payer: Self-pay | Admitting: Pharmacist

## 2023-06-06 ENCOUNTER — Ambulatory Visit (INDEPENDENT_AMBULATORY_CARE_PROVIDER_SITE_OTHER): Admitting: Pharmacist

## 2023-06-06 DIAGNOSIS — Z794 Long term (current) use of insulin: Secondary | ICD-10-CM | POA: Diagnosis not present

## 2023-06-06 DIAGNOSIS — I10 Essential (primary) hypertension: Secondary | ICD-10-CM

## 2023-06-06 DIAGNOSIS — E1159 Type 2 diabetes mellitus with other circulatory complications: Secondary | ICD-10-CM

## 2023-06-06 DIAGNOSIS — E119 Type 2 diabetes mellitus without complications: Secondary | ICD-10-CM

## 2023-06-06 NOTE — Progress Notes (Signed)
 06/06/2023 Name: Brooke [name] MRN: [id number] DOB: November 23, 1955  Chief Complaint  Patient presents with   Diabetes   Hypertension    Brooke [name] is a 68 y.o. year old female who was referred for medication management by their primary care provider, Brooke [name], Brooke Hastings, FNP. They presented for a face to face visit today.   They were referred to the pharmacist by their PCP for assistance in managing diabetes, hypertension, medication access, and complex medication management    Subjective:  Care Team: Primary Care Provider: Rosalynn [name] Brooke Hastings, FNP   Medication Access/Adherence  Current Pharmacy:  [facility] [address], [place] - [address] [place] [address] Phone: [phone] Fax: [phone]  [facility] - [place], [place] - [address] [place] [address] Phone: [phone] Fax: [phone]  [facility] - [place], [place] - [address] [place] [address] Phone: [phone] Fax: [phone]  [facility] - [place], [place] - [address] [place] [address] [place] [address] Phone: [phone] Fax: [phone]  [facility] [place], [place] - [address] [place] [address] Phone: [phone] Fax: [phone]  [facility] [place], [place] - [address] [place] [address] Phone: [phone] Fax: [phone]   Patient reports affordability concerns with their medications: Yes  Patient reports access/transportation concerns to their pharmacy: No  Patient reports adherence concerns with their medications:  No     Diabetes:  Current medications:  Medications tried in the past:   Current glucose readings:  Using libre 3 CGM meter AVG 14 134 30 156 60  140 90 163 TIR 81% 14 DAYS TIR  90 % 90 D  Current physical activity: walking  Current medication access support: lilly cares; novo  Hypertension:  Current medications: lisinopril 40 mg , hydralazine 25mg  TID, atenolol  Patient does not have a validated, automated, upper arm home BP cuff;only has wrist cuff Current blood pressure readings readings:  BP 150/72 BP 152/74 Hr 65 No changes Wrist cuff above heart is 130s-70s Denies chest pain SOB and dizziness  Patient denies hypotensive s/sx including dizziness, lightheadedness.   Current meal patterns: trying to incorporate low salt, increased water  Current physical activity: walking   Objective:  Lab Results  Component Value Date   HGBA1C 7.7 (H) 03/29/2023    Lab Results  Component Value Date   CREATININE 2.27 (H) 05/25/2023   BUN 29 (H) 05/25/2023   NA 139 05/25/2023   K 4.4 05/25/2023   CL 107 05/25/2023   CO2 25 05/25/2023    Lab Results  Component Value Date   CHOL 155 03/29/2023   HDL 44 03/29/2023   LDLCALC 74 03/29/2023   TRIG 227 (H) 03/29/2023   CHOLHDL 3.5 03/29/2023    Medications Reviewed Today     Reviewed by Delilah Fend, [facility] (Pharmacist) on 06/20/23 at 1616  Med List Status: <None>   Medication Order Taking? Sig Documenting Provider Last Dose Status Informant  acetaminophen (TYLENOL) 500 MG tablet [id number] No Take 1,000 mg by mouth every 8 (eight) hours as needed for moderate pain. [provider] Taking Active Self  albuterol (PROVENTIL) (2.5 MG/3ML) 0.083% nebulizer solution [id number] No Take 3 mLs (2.5 mg total) by nebulization every 6 (six)  hours as needed for wheezing or shortness of breath. Chrystine Crate, FNP Taking Active   albuterol (VENTOLIN HFA) 108 (90 Base) MCG/ACT inhaler [id number]  Inhale 2 puffs into the lungs every 6 (six) hours as needed for wheezing or shortness of breath. Chrystine Crate, FNP  Active   amLODipine (NORVASC) 10 MG tablet [id number] No Take 1 tablet (10 mg  total) by mouth daily. Chrystine Crate, FNP Taking Active   Ascorbic Acid (VITA-C PO) [id number] No Take 1 tablet by mouth 2 (two) times a week. [provider] Taking Active Self  aspirin EC 81 MG tablet [id number] No Take 1 tablet (81 mg total) by mouth daily. Swallow whole. Bobbetta Burnet, MD Taking Active   atenolol (TENORMIN) 100 MG tablet [id number] No Take 1 tablet (100 mg total) by mouth every morning. Chrystine Crate, FNP Taking Active   atorvastatin (LIPITOR) 80 MG tablet [id number] No Take 1 tablet (80 mg total) by mouth daily. Chrystine Crate, FNP Taking Active   Budeson-Glycopyrrol-Formoterol (BREZTRI AEROSPHERE) 160-9-4.8 MCG/ACT Sudie Ely [id number] No Inhale 2 puffs into the lungs 2 (two) times daily. Chrystine Crate, FNP Taking Active            Med Note Alpheus Arvin, Donata Fryer   Fri May 19, 2023  8:57 AM) Via [place] patient assistance program   carbamide peroxide (DEBROX) 6.5 % OTIC solution [id number] No Place 5 drops into both ears 2 (two) times daily. Mordecai Applebaum, MD Taking Active   Cholecalciferol 25 MCG (1000 UT) capsule [id number] No Take 1,000 Units by mouth every other day. [provider] Taking Active Self  Continuous Glucose Receiver (FREESTYLE LIBRE 3 READER) DEVI [id number] No 1 Device by Does not apply route as directed. with compatible Freestyle Libre 3 sensor to monitor glucose continuously. DX:E11.65 Chrystine Crate, FNP Taking Active Self  Continuous Glucose Sensor (FREESTYLE LIBRE 3 PLUS SENSOR) MISC [id number]  Change sensor every 15 days. DX: E11.65 Chrystine Crate, FNP  Active   Dulaglutide (TRULICITY) 4.5 MG/0.5ML [place] [id number] No Inject 4.5 mg as directed once a week. Chrystine Crate, FNP Taking Active   hydrALAZINE (APRESOLINE) 25 MG tablet [id number]  Take 1 tablet (25 mg total) by mouth in the morning and at bedtime. Albertha Huger, FNP  Active   Insulin Glargine [facility] KWIKPEN) 100  UNIT/ML [id number] No Inject 37 Units into the skin daily. Chrystine Crate, FNP Taking Active            Med Note Alpheus Arvin, Jarion Hawthorne D   Thu Jun 01, 2023 12:12 PM) Via [employer] patient assistance program    insulin lispro (HUMALOG KWIKPEN) 100 UNIT/ML KwikPen [id number] No Inject 10-15 Units into the skin See admin instructions. 10 units WITH BREAKFAST AND LUNCH, TAKE 15 units at [facility], Brooke Hastings, FNP Taking Active            Med Note (Raylyn Speckman D   Thu Jun 01, 2023 12:12 PM) Via [employer] patient assistance program    Insulin Pen Needle 31G X 6 MM MISC [id number] No Use to inject insulin 4 times daily as directed. DX: E11.65 Chrystine Crate, FNP Taking Active   Lancet Device MISC [id number] No Check BG prn [name], Brooke Hastings, FNP Taking Active   lisinopril (ZESTRIL) 40 MG tablet [id number]  Take 1 tablet (40 mg total) by mouth daily. Chrystine Crate, FNP  Active   Multiple Vitamin (MULTIVITAMIN ADULT PO) [id number] No Take 1 tablet  by mouth every other day. [provider] Taking Active Self  Nebulizer MISC [id number] No USE AS DIRECTED [provider] Taking Active   Med List Note Delilah Fend [facility] 06/09/21 1043): trulicity, basaglar, humalog escribe to [facility] specialty mail order symbicort-escribe to [facility] mail order jardiance-escribe to [employer] order              Assessment/Plan:   Diabetes: - Currently uncontrolled - Reviewed long term cardiovascular and renal outcomes of uncontrolled blood sugar - Reviewed goal A1c, goal fasting, and goal 2 hour post prandial glucose - Recommend to  DECREASE BASAL TO Basaglar 35 units Discontinue Trulicity --> Switch to ozempic and titrate up-->start at Ozempic 0.25mg  weekly x2 weeks then increase to 0.5mg   Continue all other medications - Patient denies personal or family history of multiple endocrine neoplasia type 2, medullary thyroid cancer; personal history of  pancreatitis or gallbladder disease. - Meets financial criteria for ozempic patient assistance program through [employer]. Will collaborate with provider, CPhT, and patient to pursue assistance.  -increase hydralazine to 25mg  3 times daily    Follow Up Plan: 2 weeks  Marvell Slider, PharmD, BCACP, CPP Clinical Pharmacist, [facility]

## 2023-06-06 NOTE — Telephone Encounter (Signed)
 Patient to transition from trulicity to ozempic 1mg  Please send me entire PAP

## 2023-06-07 ENCOUNTER — Other Ambulatory Visit (HOSPITAL_COMMUNITY): Payer: Self-pay

## 2023-06-07 ENCOUNTER — Ambulatory Visit: Attending: Family Medicine

## 2023-06-07 ENCOUNTER — Telehealth: Payer: Self-pay

## 2023-06-07 DIAGNOSIS — M25511 Pain in right shoulder: Secondary | ICD-10-CM | POA: Insufficient documentation

## 2023-06-07 DIAGNOSIS — M25611 Stiffness of right shoulder, not elsewhere classified: Secondary | ICD-10-CM | POA: Insufficient documentation

## 2023-06-07 NOTE — Progress Notes (Signed)
 Pharmacy Medication Assistance Program Note    06/14/2023  Patient ID: Brooke [name], female  DOB: 12-09-1955, 68 y.o.  MRN:  [id number]     06/07/2023  [facility] AMB MED FIVE OUTREACH  Manufacturer Medication [employer] Ozempic  Dose of Ozempic 1MG   Type of Radiographer, therapeutic Assistance  Date Application Submitted to Manufacturer 06/07/2023  Method Application Sent to Manufacturer Online  Patient Assistance Determination Approved  Approval Start Date 06/09/2023  Approval End Date 03/06/2024     Changed from Trulicity to Ozempic.  APPROVED

## 2023-06-07 NOTE — Therapy (Signed)
 OUTPATIENT PHYSICAL THERAPY UPPER EXTREMITY EVALUATION   Patient Name: Brooke [name] MRN: [id number] DOB:16-Aug-1955, 68 y.o., female Today's Date: 06/07/2023  END OF SESSION:  PT End of Session - 06/07/23 1350     Visit Number 2    Number of Visits 12    Date for PT Re-Evaluation 08/04/23    PT Start Time 1349    PT Stop Time 1430    PT Time Calculation (min) 41 min    Activity Tolerance Patient tolerated treatment well    Behavior During Therapy [facility] for tasks assessed/performed             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   Hypertension    Hyperuricemia 11/27/2020   Obesity (BMI 35.0-39.9 without comorbidity)    Renal mass 08/04/2021   Vertigo    Vitamin D insufficiency 11/27/2020   Past Surgical History:  Procedure Laterality Date   BREAST SURGERY Right 1974   OPERATIVE ULTRASOUND N/A 08/04/2021   Procedure: OPERATIVE ULTRASOUND;  Surgeon: Sebastian Ache, MD;  Location: [facility];  Service: Urology;  Laterality: N/A;   ROBOTIC ASSITED PARTIAL NEPHRECTOMY Right 08/04/2021   Procedure: XI ROBOTIC ASSITED RADICAL NEPHRECTOMY;  Surgeon: Sebastian Ache, MD;  Location: [facility];  Service: Urology;  Laterality: Right;   Patient Active Problem List   Diagnosis Date Noted   Long-term (current) use of injectable non-insulin antidiabetic drugs 03/29/2023   Acute ischemic stroke (HCC) 12/14/2022   CKD (chronic kidney disease) stage 4, GFR 15-29 ml/min (HCC) 12/14/2022   Thrombocytosis 12/14/2022   Shortness of breath 12/14/2022   Chronic pain of right knee 01/26/2022   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   Thickened endometrium 03/24/2021   Vaginal bleeding 03/24/2021   Difficulty  sleeping 02/10/2021   Positive colorectal cancer screening using Cologuard test 12/10/2020   Vitamin D insufficiency 11/27/2020   Diabetic nephropathy (HCC)    CKD stage 3 due to type 2 diabetes mellitus (HCC) 03/14/2019   Type 2 diabetes mellitus with hyperglycemia, with long-term current use of insulin (HCC) 03/13/2019   Hyperlipidemia associated with type 2 diabetes mellitus (HCC) 03/13/2019   Hypertension associated with diabetes (HCC) 03/13/2019   Obesity (BMI 30-39.9) 03/13/2019   Chronic bronchitis (HCC) 03/13/2019   REFERRING PROVIDER: [name], FNP   REFERRING DIAG: Chronic right shoulder pain   THERAPY DIAG:  Acute pain of right shoulder  Stiffness of right shoulder, not elsewhere classified  Rationale for Evaluation and Treatment: Rehabilitation  ONSET DATE: 3 and a half weeks ago   SUBJECTIVE:  SUBJECTIVE STATEMENT: Pt reports 4-5/10 left shoulder pain today.  Hand dominance: Right  PERTINENT HISTORY: Hypertension, obesity, history of a CVA, CKD, arthritis, and diabetes  PAIN:  Are you having pain? Yes: NPRS scale: 4-5/10 Pain location: Left shoulder  Pain description: Throbbing and sore Aggravating factors: Reaching behind her back, reaching to her other shoulder, performing household chores like vacuuming, sweeping and cleaning  Relieving factors: Resting by her side, heat, ice   PRECAUTIONS: None  RED FLAGS: None   WEIGHT BEARING RESTRICTIONS: No  FALLS:  Has patient fallen in last 6 months? No  LIVING ENVIRONMENT: Lives with: lives with their spouse Lives in: Mobile home Stairs: Yes: External: 4 steps; none Has following equipment at home: None  OCCUPATION: Retired  PLOF: Needs assistance with homemaking  PATIENT GOALS: Decrease pain, less  difficulty with work activities, sleep longer, improve movement, improve strength, less difficulty with home activities  NEXT MD VISIT: 06/24/23  OBJECTIVE:  Note: Objective measures were completed at Evaluation unless otherwise noted.  DIAGNOSTIC FINDINGS:  BP: 167/81 assessed in left arm while sitting  PATIENT SURVEYS :  Quick Dash 43.18  COGNITION: Overall cognitive status: Within functional limits for tasks assessed     SENSATION: WFL  POSTURE: No deficits observed   UPPER EXTREMITY ROM:   Active ROM Right eval Left eval  Shoulder flexion 138 sore when raising her arm   PROM: no limitation and nonpainful 130   Shoulder extension    Shoulder abduction 138; "pressure" when overpressure was provided   Firm end feel with PROM 175  Shoulder adduction    Shoulder internal rotation To S1 To L5  Shoulder external rotation To C7; familiar pain To T3   Shoulder Horizontal Adduction  15 degrees limited by pain and joint crepitus at [facility] joint with PROM    Elbow flexion    Elbow extension    Wrist flexion    Wrist extension    Wrist ulnar deviation    Wrist radial deviation    Wrist pronation    Wrist supination    (Blank rows = not tested)    UPPER EXTREMITY MMT:   MMT Right eval Left eval  Shoulder flexion 3+/5 4/5  Shoulder extension    Shoulder abduction 4-/5 4-/5  Shoulder adduction 3-/5 4+/5  Shoulder internal rotation 3/5 3/5  Shoulder external rotation 3/5 3/5  Middle trapezius    Lower trapezius    Elbow flexion    Elbow extension    Wrist flexion    Wrist extension    Wrist ulnar deviation    Wrist radial deviation    Wrist pronation    Wrist supination    Grip strength (lbs)    (Blank rows = not tested)  SHOULDER SPECIAL TESTS: Instability tests: Apprehension test: positive   JOINT MOBILITY TESTING:  Left GH PAM: WFL and nonpainful  Left AC PAM: WFL and nonpainful   Right AC PAM: WFL and nonpainful  Right GH PAM: Posterior glide  painful     PALPATION:  No findings reported with palpation of the AC joint.   Pain and tenderness reported with palpation of the GH joint, upper traps, LH Biceps tendon, and Deltoid muscle.  TREATMENT DATE:    06/07/23                                 EXERCISE LOG  Exercise Repetitions and Resistance Comments  Pulleys 6 mins   Ranger Flex/ext; CW and CCW circles x 2 mins each   AA Shoulder Abduction Cane x 15 reps   AA Flexion 2# x 15 reps    AA Chest Press 2# x 15 reps   Rows Yellow 20 reps   Extensions Yellow 20 reps    ER Yellow 20 reps   Horizontal Abduction Yellow 20 reps   3-way Bicep Curls 2# x 15 reps each bil    Blank cell = exercise not performed today   PATIENT EDUCATION: Education details: POC, objective findings, goals for physical therapy, and prognosis Person educated: Patient Education method: Explanation Education comprehension: verbalized understanding  HOME EXERCISE PROGRAM:   ASSESSMENT:  CLINICAL IMPRESSION:  Pt arrives for today's treatment session reporting 4-5/10 left shoulder pain, but denies any right shoulder.  Pt reports that left shoulder pain escalated to 8-9/10 this morning due sleeping on her left side during the night.  Pt instructed in numerous passive, AA, and active ROM exercises today.  Pt requiring min cues for proper technique and posture with all newly added exercises.  Pt requiring tactile cues to keep elbows at her side with ER.  Pt reported "feeling good" at completion of today's treatment session.  OBJECTIVE IMPAIRMENTS:  decreased activity tolerance, decreased mobility, decreased ROM, decreased strength, hypomobility, impaired UE functional use, and pain.   ACTIVITY LIMITATIONS: carrying, lifting, sleeping, stairs, bed mobility, and reach over head  PARTICIPATION LIMITATIONS: meal prep, cleaning, and yard  work  PERSONAL FACTORS: Past/current experiences and 3+ comorbidities: Hypertension, obesity, history of a CVA, CKD, arthritis, and diabetes  are also affecting patient's functional outcome.   REHAB POTENTIAL: Good  CLINICAL DECISION MAKING: Evolving/moderate complexity  EVALUATION COMPLEXITY: Moderate  GOALS: Goals reviewed with patient? Yes  SHORT TERM GOALS: Target date: 06/22/23  In 4 weeks, the patient's shoulder pain will decrease from 8/10 to 6/10 which will aide in her ability to cook food independently.  Baseline: Goal status: INITIAL  2.  In 4 weeks, patient's shoulder ER mobility will increase to at least T3 which will allow her to have improved tolerance with donning/ doffing a shirt.  Baseline:  Goal status: INITIAL  3.   Patient will be independent with her initial HEP. Baseline:  Goal status: INITIAL   LONG TERM GOALS: Target date: 07/13/23  Patient's shoulder pain will decrease from 6/10 to 4/10 which will allow for improved ability to perform household chores. Baseline:  Goal status: INITIAL  2.  In 8 weeks, the patient's Quick Dash score will improve from 43.18% to 33%, indicating improved tolerance for recreational activities that involve shoulder movement.  Baseline:  Goal status: INITIAL  3.  Patient will be independent with her advanced HEP. Baseline:  Goal status: INITIAL  PLAN:  PT FREQUENCY: 2x/week  PT DURATION: 6 weeks  PLANNED INTERVENTIONS: 97164- PT Re-evaluation, 97110-Therapeutic exercises, 97530- Therapeutic activity, 97112- Neuromuscular re-education, 97535- Self Care, 54098- Manual therapy, G0283- Electrical stimulation (unattended), 97016- Vasopneumatic device, Patient/Family education, Taping, Joint mobilization, Cryotherapy, and Moist heat  PLAN FOR NEXT SESSION: UBE, Shoulder PROM, AAROM, GH Joint mobilizations, RC and Trap muscle soft tissue mobilizations, Modalities as needed.    Newman Pies, PTA 06/07/2023, 3:35 PM  Today's evaluation was completed with direct 1:1 supervision by Candi Leash, PT, DPT

## 2023-06-08 ENCOUNTER — Telehealth: Payer: Self-pay | Admitting: Pharmacist

## 2023-06-08 NOTE — Telephone Encounter (Signed)
     06/08/2023 Name: Brooke [name]      MRN: [id number]       DO  Diabetes: - Currently uncontrolled - Reviewed long term cardiovascular and renal outcomes of uncontrolled blood sugar - Reviewed goal A1c, goal fasting, and goal 2 hour post prandial glucose - Recommend to  DECREASE BASAL TO Basaglar  33 units Continue Ozempic  0.5mg  weekly Hold AM humalog  if running below 80 Hypoglycemia this AM to 68---rec 3 steady meals daily and insulin  changes Continue all other medications    Follow Up Plan: 2 weeks   Marvell Slider, PharmD, BCACP, CPP Clinical Pharmacist, [facility]

## 2023-06-14 ENCOUNTER — Encounter

## 2023-06-14 DIAGNOSIS — D631 Anemia in chronic kidney disease: Secondary | ICD-10-CM | POA: Diagnosis not present

## 2023-06-14 DIAGNOSIS — N189 Chronic kidney disease, unspecified: Secondary | ICD-10-CM | POA: Diagnosis not present

## 2023-06-14 DIAGNOSIS — R809 Proteinuria, unspecified: Secondary | ICD-10-CM | POA: Diagnosis not present

## 2023-06-14 DIAGNOSIS — E211 Secondary hyperparathyroidism, not elsewhere classified: Secondary | ICD-10-CM | POA: Diagnosis not present

## 2023-06-15 ENCOUNTER — Other Ambulatory Visit (HOSPITAL_COMMUNITY): Payer: Self-pay

## 2023-06-19 ENCOUNTER — Ambulatory Visit

## 2023-06-19 ENCOUNTER — Other Ambulatory Visit: Payer: Self-pay | Admitting: Family Medicine

## 2023-06-19 DIAGNOSIS — M25611 Stiffness of right shoulder, not elsewhere classified: Secondary | ICD-10-CM

## 2023-06-19 DIAGNOSIS — M25511 Pain in right shoulder: Secondary | ICD-10-CM

## 2023-06-19 DIAGNOSIS — E1165 Type 2 diabetes mellitus with hyperglycemia: Secondary | ICD-10-CM

## 2023-06-19 MED ORDER — FREESTYLE LIBRE 3 PLUS SENSOR MISC: 3 refills | Status: DC

## 2023-06-19 NOTE — Telephone Encounter (Signed)
 Copied from CRM [id number]. Topic: Clinical - Prescription Issue >> Jun 19, 2023 12:27 PM Phil Braun wrote: Reason for CRM: \ Ref Continuous Glucose Sensor (FREESTYLE LIBRE 3 PLUS SENSOR)  Pharmacy is stating that they need a prior authorization before this can be filled.

## 2023-06-19 NOTE — Therapy (Signed)
 OUTPATIENT PHYSICAL THERAPY UPPER EXTREMITY TREATMENT   Patient Name: Brooke [name] MRN: [id number] DOB:June 27, 1955, 68 y.o., female Today's Date: 06/19/2023  END OF SESSION:  PT End of Session - 06/19/23 1052     Visit Number 3    Number of Visits 12    Date for PT Re-Evaluation 08/04/23    PT Start Time 1053    PT Stop Time 1135    PT Time Calculation (min) 42 min    Activity Tolerance Patient tolerated treatment well    Behavior During Therapy [facility] for tasks assessed/performed              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   Hypertension    Hyperuricemia 11/27/2020   Obesity (BMI 35.0-39.9 without comorbidity)    Renal mass 08/04/2021   Vertigo    Vitamin D insufficiency 11/27/2020   Past Surgical History:  Procedure Laterality Date   BREAST SURGERY Right 1974   OPERATIVE ULTRASOUND N/A 08/04/2021   Procedure: OPERATIVE ULTRASOUND;  Surgeon: Osborn Blaze, MD;  Location: [facility];  Service: Urology;  Laterality: N/A;   ROBOTIC ASSITED PARTIAL NEPHRECTOMY Right 08/04/2021   Procedure: XI ROBOTIC ASSITED RADICAL NEPHRECTOMY;  Surgeon: Osborn Blaze, MD;  Location: [facility];  Service: Urology;  Laterality: Right;   Patient Active Problem List   Diagnosis Date Noted   Long-term (current) use of injectable non-insulin antidiabetic drugs 03/29/2023   Acute ischemic stroke (HCC) 12/14/2022   CKD (chronic kidney disease) stage 4, GFR 15-29 ml/min (HCC) 12/14/2022   Thrombocytosis 12/14/2022   Shortness of breath 12/14/2022   Chronic pain of right knee 01/26/2022   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   Thickened endometrium 03/24/2021   Vaginal bleeding 03/24/2021   Difficulty  sleeping 02/10/2021   Positive colorectal cancer screening using Cologuard test 12/10/2020   Vitamin D insufficiency 11/27/2020   Diabetic nephropathy (HCC)    CKD stage 3 due to type 2 diabetes mellitus (HCC) 03/14/2019   Type 2 diabetes mellitus with hyperglycemia, with long-term current use of insulin (HCC) 03/13/2019   Hyperlipidemia associated with type 2 diabetes mellitus (HCC) 03/13/2019   Hypertension associated with diabetes (HCC) 03/13/2019   Obesity (BMI 30-39.9) 03/13/2019   Chronic bronchitis (HCC) 03/13/2019   REFERRING PROVIDER: Chrystine Crate, FNP   REFERRING DIAG: Chronic right shoulder pain   THERAPY DIAG:  Acute pain of right shoulder  Stiffness of right shoulder, not elsewhere classified  Rationale for Evaluation and Treatment: Rehabilitation  ONSET DATE: 3 and a half weeks ago   SUBJECTIVE:  SUBJECTIVE STATEMENT: Patient reports that her shoulder feels good today as it is not hurting any.   Hand dominance: Right  PERTINENT HISTORY: Hypertension, obesity, history of a CVA, CKD, arthritis, and diabetes  PAIN:  Are you having pain? Yes: NPRS scale: 0/10 Pain location: Left shoulder  Pain description: Throbbing and sore Aggravating factors: Reaching behind her back, reaching to her other shoulder, performing household chores like vacuuming, sweeping and cleaning  Relieving factors: Resting by her side, heat, ice   PRECAUTIONS: None  RED FLAGS: None   WEIGHT BEARING RESTRICTIONS: No  FALLS:  Has patient fallen in last 6 months? No  LIVING ENVIRONMENT: Lives with: lives with their spouse Lives in: Mobile home Stairs: Yes: External: 4 steps; none Has following equipment at home: None  OCCUPATION: Retired  PLOF: Needs assistance with homemaking  PATIENT  GOALS: Decrease pain, less difficulty with work activities, sleep longer, improve movement, improve strength, less difficulty with home activities  NEXT MD VISIT: 06/24/23  OBJECTIVE:  Note: Objective measures were completed at Evaluation unless otherwise noted.  DIAGNOSTIC FINDINGS:  BP: 167/81 assessed in left arm while sitting  PATIENT SURVEYS :  Quick Dash 43.18  COGNITION: Overall cognitive status: Within functional limits for tasks assessed     SENSATION: WFL  POSTURE: No deficits observed   UPPER EXTREMITY ROM:   Active ROM Right eval Left eval  Shoulder flexion 138 sore when raising her arm   PROM: no limitation and nonpainful 130   Shoulder extension    Shoulder abduction 138; "pressure" when overpressure was provided   Firm end feel with PROM 175  Shoulder adduction    Shoulder internal rotation To S1 To L5  Shoulder external rotation To C7; familiar pain To T3   Shoulder Horizontal Adduction  15 degrees [facility]    Elbow flexion    Elbow extension    Wrist flexion    Wrist extension    Wrist ulnar deviation    Wrist radial deviation    Wrist pronation    Wrist supination    (Blank rows = not tested)    UPPER EXTREMITY MMT:   MMT Right eval Left eval  Shoulder flexion 3+/5 4/5  Shoulder extension    Shoulder abduction 4-/5 4-/5  Shoulder adduction 3-/5 4+/5  Shoulder internal rotation 3/5 3/5  Shoulder external rotation 3/5 3/5  Middle trapezius    Lower trapezius    Elbow flexion    Elbow extension    Wrist flexion    Wrist extension    Wrist ulnar deviation    Wrist radial deviation    Wrist pronation    Wrist supination    Grip strength (lbs)    (Blank rows = not tested)  SHOULDER SPECIAL TESTS: Instability tests: Apprehension test: positive   JOINT MOBILITY TESTING:  Left GH PAM: WFL and nonpainful  Left AC PAM: WFL and nonpainful   Right AC PAM: WFL and nonpainful  Right  GH PAM: Posterior glide painful     PALPATION:  No findings reported with palpation of the AC joint.   Pain and tenderness reported with palpation of the GH joint, upper traps, LH Biceps tendon, and Deltoid muscle.  TREATMENT DATE:                                    06/19/23 EXERCISE LOG  Exercise Repetitions and Resistance Comments  Pulleys  3 minutes  Flexion  UBE 8 minutes @ 90 RPM    UE ranger (standing)  2 minutes  Multidirectional   R shoulder ADD Red t-band x 3 minutes   Therabar bending  Red t-bar x 2 minutes each  Up and down  Resisted row  Red t-band x 3 minutes   Resisted pull down  Red t-band x 2 x 1.5 minutes    R shoulder AA ER  2 minutes  With cane        Blank cell = exercise not performed today    06/07/23                                 EXERCISE LOG  Exercise Repetitions and Resistance Comments  Pulleys 6 mins   Ranger Flex/ext; CW and CCW circles x 2 mins each   AA Shoulder Abduction Cane x 15 reps   AA Flexion 2# x 15 reps    AA Chest Press 2# x 15 reps   Rows Yellow 20 reps   Extensions Yellow 20 reps    ER Yellow 20 reps   Horizontal Abduction Yellow 20 reps   3-way Bicep Curls 2# x 15 reps each bil    Blank cell = exercise not performed today   PATIENT EDUCATION: Education details: POC, objective findings, goals for physical therapy, and prognosis Person educated: Patient Education method: Explanation Education comprehension: verbalized understanding  HOME EXERCISE PROGRAM:   ASSESSMENT:  CLINICAL IMPRESSION:  Patient was progressed with multiple new and familiar interventions for improved muscular strength and functional mobility with moderate difficulty and fatigue. She required minimal cueing with today's interventions for proper pacing to avoid a significant increase in muscular fatigue. She required brief rest breaks  throughout treatment due to muscular fatigue. She reported feeling tired upon the conclusion of treatment. She continues to require skilled physical therapy to address her remaining impairments to return to her prior level of function.   OBJECTIVE IMPAIRMENTS:  decreased activity tolerance, decreased mobility, decreased ROM, decreased strength, hypomobility, impaired UE functional use, and pain.   ACTIVITY LIMITATIONS: carrying, lifting, sleeping, stairs, bed mobility, and reach over head  PARTICIPATION LIMITATIONS: meal prep, cleaning, and yard work  PERSONAL FACTORS: Past/current experiences and 3+ comorbidities: Hypertension, obesity, history of a CVA, CKD, arthritis, and diabetes  are also affecting patient's functional outcome.   REHAB POTENTIAL: Good  CLINICAL DECISION MAKING: Evolving/moderate complexity  EVALUATION COMPLEXITY: Moderate  GOALS: Goals reviewed with patient? Yes  SHORT TERM GOALS: Target date: 06/22/23  In 4 weeks, the patient's shoulder pain will decrease from 8/10 to 6/10 which will aide in her ability to cook food independently.  Baseline: Goal status: INITIAL  2.  In 4 weeks, patient's shoulder ER mobility will increase to at least T3 which will allow her to have improved tolerance with donning/ doffing a shirt.  Baseline:  Goal status: INITIAL  3.   Patient will be independent with her initial HEP. Baseline:  Goal status: INITIAL   LONG TERM GOALS: Target date: 07/13/23  Patient's shoulder pain will decrease from 6/10 to 4/10 which will allow for improved ability to perform  household chores. Baseline:  Goal status: INITIAL  2.  In 8 weeks, the patient's Quick Dash score will improve from 43.18% to 33%, indicating improved tolerance for recreational activities that involve shoulder movement.  Baseline:  Goal status: INITIAL  3.  Patient will be independent with her advanced HEP. Baseline:  Goal status: INITIAL  PLAN:  PT FREQUENCY:  2x/week  PT DURATION: 6 weeks  PLANNED INTERVENTIONS: 97164- PT Re-evaluation, 97110-Therapeutic exercises, 97530- Therapeutic activity, 97112- Neuromuscular re-education, 97535- Self Care, 40981- Manual therapy, G0283- Electrical stimulation (unattended), 97016- Vasopneumatic device, Patient/Family education, Taping, Joint mobilization, Cryotherapy, and Moist heat  PLAN FOR NEXT SESSION: UBE, Shoulder PROM, AAROM, GH Joint mobilizations, RC and Trap muscle soft tissue mobilizations, Modalities as needed.    Lane Pinon, PT 06/19/2023, 11:48 AM

## 2023-06-20 MED ORDER — OZEMPIC (0.25 OR 0.5 MG/DOSE) 2 MG/3ML ~~LOC~~ SOPN: 0.5000 mg | PEN_INJECTOR | SUBCUTANEOUS | Status: DC

## 2023-06-21 ENCOUNTER — Ambulatory Visit: Admitting: Internal Medicine

## 2023-06-21 ENCOUNTER — Encounter

## 2023-06-21 ENCOUNTER — Telehealth: Payer: Self-pay | Admitting: Pharmacist

## 2023-06-23 DIAGNOSIS — E1129 Type 2 diabetes mellitus with other diabetic kidney complication: Secondary | ICD-10-CM | POA: Diagnosis not present

## 2023-06-23 DIAGNOSIS — R809 Proteinuria, unspecified: Secondary | ICD-10-CM | POA: Diagnosis not present

## 2023-06-23 DIAGNOSIS — I129 Hypertensive chronic kidney disease with stage 1 through stage 4 chronic kidney disease, or unspecified chronic kidney disease: Secondary | ICD-10-CM | POA: Diagnosis not present

## 2023-06-23 DIAGNOSIS — N184 Chronic kidney disease, stage 4 (severe): Secondary | ICD-10-CM | POA: Diagnosis not present

## 2023-06-27 ENCOUNTER — Encounter: Admitting: *Deleted

## 2023-06-27 ENCOUNTER — Ambulatory Visit: Payer: Medicare Other | Admitting: Family Medicine

## 2023-06-27 ENCOUNTER — Telehealth: Payer: Self-pay

## 2023-06-27 ENCOUNTER — Encounter: Payer: Self-pay | Admitting: Family Medicine

## 2023-06-27 VITALS — BP 134/65 | HR 68 | Temp 98.0°F | Ht 65.0 in | Wt 218.0 lb

## 2023-06-27 DIAGNOSIS — Z7985 Long-term (current) use of injectable non-insulin antidiabetic drugs: Secondary | ICD-10-CM | POA: Diagnosis not present

## 2023-06-27 DIAGNOSIS — E1165 Type 2 diabetes mellitus with hyperglycemia: Secondary | ICD-10-CM

## 2023-06-27 DIAGNOSIS — J410 Simple chronic bronchitis: Secondary | ICD-10-CM

## 2023-06-27 DIAGNOSIS — N184 Chronic kidney disease, stage 4 (severe): Secondary | ICD-10-CM

## 2023-06-27 DIAGNOSIS — E1159 Type 2 diabetes mellitus with other circulatory complications: Secondary | ICD-10-CM

## 2023-06-27 DIAGNOSIS — N183 Chronic kidney disease, stage 3 unspecified: Secondary | ICD-10-CM | POA: Diagnosis not present

## 2023-06-27 DIAGNOSIS — Z794 Long term (current) use of insulin: Secondary | ICD-10-CM | POA: Diagnosis not present

## 2023-06-27 DIAGNOSIS — D649 Anemia, unspecified: Secondary | ICD-10-CM | POA: Diagnosis not present

## 2023-06-27 DIAGNOSIS — E559 Vitamin D deficiency, unspecified: Secondary | ICD-10-CM

## 2023-06-27 DIAGNOSIS — I152 Hypertension secondary to endocrine disorders: Secondary | ICD-10-CM | POA: Diagnosis not present

## 2023-06-27 DIAGNOSIS — E1122 Type 2 diabetes mellitus with diabetic chronic kidney disease: Secondary | ICD-10-CM

## 2023-06-27 DIAGNOSIS — E119 Type 2 diabetes mellitus without complications: Secondary | ICD-10-CM

## 2023-06-27 LAB — BAYER DCA HB A1C WAIVED: HB A1C (BAYER DCA - WAIVED): 7.2 % — ABNORMAL HIGH (ref 4.8–5.6)

## 2023-06-27 NOTE — Telephone Encounter (Signed)
 Called to inform patient that Ozempic  has arrived in office and ready for pick up. Patient verbalized understanding.

## 2023-06-27 NOTE — Patient Instructions (Addendum)
 Grow with Nancyann Aye  Move with Peterson Brandt  Yoga with Adriene     Albuterol  = as needed  Breztri  = 2 puff twice daily every day

## 2023-06-27 NOTE — Progress Notes (Addendum)
 Subjective:  Patient ID: Winifred Haven, female    DOB: Jan 22, 1956, 68 y.o.   MRN: [id number]  Patient Care Team: Chrystine Crate, FNP as PCP - General (Family Medicine) Delilah Fend, [facility] (Pharmacist) Alexia Idler, OD (Optometry) Candi Chafe, Pearletha Bouche, MD as Consulting Physician (Ophthalmology) Jane Meager, MD as Referring Physician (Nephrology)   Chief Complaint:  Medical Management of Chronic Issues (3 month follow up)  HPI: Zyan Mirkin is a 69 y.o. female presenting on 06/27/2023 for Medical Management of Chronic Issues (3 month follow up)  HPI Hypertension associated with diabetes [facility]) She has been checking it at home with wrist monitor. She has received omron. Averaging 130s SBP. Still has some measurements in the 150s SBP. She has cut back on her salt. She is exercising some at home. She does not like to walk alone, so she is mainly doing some exercises that she can do in her living room. She is taking hydralazine  3 times daily.   Type 2 diabetes mellitus with hyperglycemia, with long-term current use of insulin  (HCC) Glucometer:Libre 3   High at home: 261; Low at home: 55. - states that she is consistently low in the mornings. Most mornings. States that she is symptomatic with nervousness and "woozy"  Taking medication(s): Decreased to 35 units on basaglar . She is taking ozempic .   Last eye exam: not due  Last foot exam: due  Last A1c:  Lab Results  Component Value Date   HGBA1C 7.7 (H) 03/29/2023   Nephropathy screen indicated?: not due - seeing nephrology  Last flu, zoster and/or pneumovax:  Immunization History  Administered Date(s) Administered   Fluad Quad(high Dose 65+) 03/13/2019, 11/28/2019, 12/10/2020   Fluad Trivalent(High Dose 65+) 05/18/2023   PFIZER(Purple Top)SARS-COV-2 Vaccination 04/12/2019, 05/03/2019, 01/14/2020   ROS: Denies LOC, polyuria, polydipsia, unintended weight loss/gain, foot ulcerations, numbness or tingling  in extremities, shortness of breath or chest pain. Endorses dizziness  Following with Dr. Carrolyn Clan, he is working with her HTN and CKD as well.   Simple chronic bronchitis (HCC) She states that things are going okay.  She reports that she mixed up her albuterol  and breztri . She has been taking albuterol  scheduled and breztri  as needed.   Anemia  She is taking iron supplement. Continues to have some dizziness.   Vitamin D  insufficiency Taking supplement.  Denies bone fracture or pain.  Denies numbness/tingling. Endorses fatigue    Relevant past medical, surgical, family, and social history reviewed and updated as indicated.  Allergies and medications reviewed and updated. Data reviewed: Chart in Epic.   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   Hypertension    Hyperuricemia 11/27/2020   Obesity (BMI 35.0-39.9 without comorbidity)    Renal mass 08/04/2021   Vertigo    Vitamin D  insufficiency 11/27/2020    Past Surgical History:  Procedure Laterality Date   BREAST SURGERY Right 1974   OPERATIVE ULTRASOUND N/A 08/04/2021   Procedure: OPERATIVE ULTRASOUND;  Surgeon: Osborn Blaze, MD;  Location: [facility];  Service: Urology;  Laterality: N/A;   ROBOTIC ASSITED PARTIAL NEPHRECTOMY Right 08/04/2021   Procedure: XI ROBOTIC ASSITED RADICAL NEPHRECTOMY;  Surgeon: Secundino Dach,  Felton Hough, MD;  Location: [facility];  Service: Urology;  Laterality: Right;    Social History   Socioeconomic History   Marital status: Married    Spouse name: Donnie   Number of children: 1   Years of education: Not on file   Highest education level: Not on file  Occupational History   Not on file  Tobacco Use   Smoking status: Never    Smokeless tobacco: Never  Vaping Use   Vaping status: Never Used  Substance and Sexual Activity   Alcohol use: No   Drug use: No   Sexual activity: Never  Other Topics Concern   Not on file  Social History Narrative   1 daughter    1 grandson      Lives with husband    Retired    Chief Executive Officer Drivers of Corporate investment banker 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  (04/28/2023)   Overall Financial Resource Strain (CARDIA)    Difficulty of Paying Living Expenses: Not hard at all  Food Insecurity: No Food Insecurity (04/28/2023)   Hunger Vital Sign    Worried About Running Out of Food in the Last Year: Never true    Ran Out of Food in the Last Year: Never true  Transportation Needs: No Transportation Needs (04/28/2023)   PRAPARE - Administrator, Civil Service (Medical): No    Lack of Transportation (Non-Medical): No  Physical Activity: Inactive (04/28/2023)   Exercise Vital Sign    Days of Exercise per Week: 0 days    Minutes of Exercise per Session: 0 min  Stress: No Stress Concern Present (04/28/2023)   [employer] of Occupational Health - Occupational Stress Questionnaire    Feeling of Stress : Not at all  Social Connections: Moderately Integrated (04/28/2023)   Social Connection and Isolation Panel [NHANES]    Frequency of Communication with Friends and Family: More than three times a week    Frequency of Social Gatherings with Friends and Family: Three times a week    Attends Religious Services: More than 4 times per year    Active Member of Clubs or Organizations: No    Attends Banker Meetings: Never    Marital Status: Married  Catering manager Violence: Not At Risk (04/28/2023)   Humiliation, Afraid, Rape, and Kick questionnaire    Fear of Current or Ex-Partner: No    Emotionally Abused: No    Physically Abused: No    Sexually Abused: No    Outpatient Encounter Medications as of 06/27/2023  Medication Sig   acetaminophen  (TYLENOL ) 500 MG tablet Take 1,000  mg by mouth every 8 (eight) hours as needed for moderate pain.   albuterol  (PROVENTIL ) (2.5 MG/3ML) 0.083% nebulizer solution Take 3 mLs (2.5 mg total) by nebulization every 6 (six) hours as needed for wheezing or shortness of breath.   albuterol  (VENTOLIN  HFA) 108 (90 Base) MCG/ACT inhaler Inhale 2 puffs into the lungs every 6 (six) hours as needed for wheezing or shortness of breath.   amLODipine  (NORVASC ) 10 MG tablet Take 1 tablet (10 mg total) by mouth daily.   Ascorbic Acid (VITA-C PO) Take 1 tablet by mouth 2 (two) times a week.   aspirin  EC 81 MG tablet Take 1 tablet (81 mg total) by mouth daily. Swallow whole.   atenolol  (TENORMIN ) 100 MG tablet Take 1 tablet (100 mg total) by mouth every morning.   atorvastatin  (LIPITOR) 80 MG tablet Take 1 tablet (80 mg total) by mouth daily.  Budeson-Glycopyrrol-Formoterol  (BREZTRI  AEROSPHERE) 160-9-4.8 MCG/ACT AERO Inhale 2 puffs into the lungs 2 (two) times daily.   carbamide peroxide (DEBROX) 6.5 % OTIC solution Place 5 drops into both ears 2 (two) times daily.   Cholecalciferol 25 MCG (1000 UT) capsule Take 1,000 Units by mouth every other day.   Continuous Glucose Receiver (FREESTYLE LIBRE 3 READER) DEVI 1 Device by Does not apply route as directed. with compatible Freestyle Libre 3 sensor to monitor glucose continuously. DX:E11.65   Continuous Glucose Sensor (FREESTYLE LIBRE 3 PLUS SENSOR) MISC Change sensor every 15 days. DX: E11.65   hydrALAZINE  (APRESOLINE ) 25 MG tablet Take 1 tablet (25 mg total) by mouth in the morning and at bedtime. (Patient taking differently: Take 25 mg by mouth 3 (three) times daily.)   Insulin  Glargine (BASAGLAR  KWIKPEN) 100 UNIT/ML Inject 37 Units into the skin daily. (Patient taking differently: Inject 35 Units into the skin daily.)   insulin  lispro (HUMALOG  KWIKPEN) 100 UNIT/ML KwikPen Inject 10-15 Units into the skin See admin instructions. 10 units WITH BREAKFAST AND LUNCH, TAKE 15 units at [facility]   Insulin  Pen  Needle 31G X 6 MM MISC Use to inject insulin  4 times daily as directed. DX: E11.65   Lancet Device MISC Check BG prn   lisinopril  (ZESTRIL ) 40 MG tablet Take 1 tablet (40 mg total) by mouth daily.   Multiple Vitamin (MULTIVITAMIN ADULT PO) Take 1 tablet by mouth every other day.   Nebulizer MISC USE AS DIRECTED   Semaglutide ,0.25 or 0.5MG /DOS, (OZEMPIC , 0.25 OR 0.5 MG/DOSE,) 2 MG/3ML SOPN Inject 0.5 mg into the skin once a week. Getting via novo nordisk PAP   No facility-administered encounter medications on file as of 06/27/2023.    Allergies  Allergen Reactions   Jardiance  [Empagliflozin ]     Holding per nephro, pt reports no allergy, Dr just didn't want pt taking it   Kerendia  [Finerenone ]     Holding per nephro, pt reports no allergy, Dr just didn't want pt taking it   Metformin  And Related     Contraindicated due to GFR; GI side effects Pt states not a real allergy    Review of Systems As per HPI  Objective:  BP 134/65   Pulse 68   Temp 98 F (36.7 C)   Ht 5\' 5"  (1.651 m)   Wt 218 lb (98.9 kg)   LMP 03/16/2012   SpO2 97%   BMI 36.28 kg/m    Wt Readings from Last 3 Encounters:  06/27/23 218 lb (98.9 kg)  05/26/23 215 lb (97.5 kg)  05/25/23 216 lb 0.8 oz (98 kg)    Physical Exam Constitutional:      General: She is awake. She is not in acute distress.    Appearance: Normal appearance. She is well-developed and well-groomed. She is obese. She is not ill-appearing, toxic-appearing or diaphoretic.  Cardiovascular:     Rate and Rhythm: Normal rate and regular rhythm.     Pulses: Normal pulses.          Radial pulses are 2+ on the right side and 2+ on the left side.       Posterior tibial pulses are 2+ on the right side and 2+ on the left side.     Heart sounds: Normal heart sounds. No murmur heard.    No gallop.  Pulmonary:     Effort: Pulmonary effort is normal. No respiratory distress.     Breath sounds: Normal breath sounds. No stridor. No wheezing, rhonchi or  rales.  Musculoskeletal:     Cervical back: Full passive range of motion without pain and neck supple.     Right lower leg: No edema.     Left lower leg: No edema.  Feet:     Right foot:     Protective Sensation: 10 sites tested.  10 sites sensed.     Toenail Condition: Right toenails are abnormally thick and long.     Left foot:     Protective Sensation: 10 sites tested.  10 sites sensed.     Toenail Condition: Left toenails are abnormally thick and long.  Skin:    General: Skin is warm.     Capillary Refill: Capillary refill takes less than 2 seconds.  Neurological:     General: No focal deficit present.     Mental Status: She is alert, oriented to person, place, and time and easily aroused. Mental status is at baseline.     GCS: GCS eye subscore is 4. GCS verbal subscore is 5. GCS motor subscore is 6.     Motor: No weakness.  Psychiatric:        Attention and Perception: Attention and perception normal.        Mood and Affect: Mood and affect normal.        Speech: Speech normal.        Behavior: Behavior normal. Behavior is cooperative.        Thought Content: Thought content normal. Thought content does not include homicidal or suicidal ideation. Thought content does not include homicidal or suicidal plan.        Cognition and Memory: Cognition and memory normal.        Judgment: Judgment normal.      Results for orders placed or performed during the hospital encounter of 05/25/23  Basic metabolic panel   Collection Time: 05/25/23 10:46 AM  Result Value Ref Range   Sodium 139 135 - 145 mmol/L   Potassium 4.4 3.5 - 5.1 mmol/L   Chloride 107 98 - 111 mmol/L   CO2 25 22 - 32 mmol/L   Glucose, Bld 77 70 - 99 mg/dL   BUN 29 (H) 8 - 23 mg/dL   Creatinine, Ser 1.61 (H) 0.44 - 1.00 mg/dL   Calcium  9.1 8.9 - 10.3 mg/dL   GFR, Estimated 23 (L) >60 mL/min   Anion gap 7 5 - 15  CBC with Differential   Collection Time: 05/25/23 10:46 AM  Result Value Ref Range   WBC 11.2 (H)  4.0 - 10.5 K/uL   RBC 4.39 3.87 - 5.11 MIL/uL   Hemoglobin 11.6 (L) 12.0 - 15.0 g/dL   HCT 09.6 04.5 - 40.9 %   MCV 84.3 80.0 - 100.0 fL   MCH 26.4 26.0 - 34.0 pg   MCHC 31.4 30.0 - 36.0 g/dL   RDW 81.1 (H) 91.4 - 78.2 %   Platelets 442 (H) 150 - 400 K/uL   nRBC 0.0 0.0 - 0.2 %   Neutrophils Relative % 62 %   Neutro Abs 6.9 1.7 - 7.7 K/uL   Lymphocytes Relative 24 %   Lymphs Abs 2.7 0.7 - 4.0 K/uL   Monocytes Relative 5 %   Monocytes Absolute 0.6 0.1 - 1.0 K/uL   Eosinophils Relative 8 %   Eosinophils Absolute 0.9 (H) 0.0 - 0.5 K/uL   Basophils Relative 1 %   Basophils Absolute 0.1 0.0 - 0.1 K/uL   Immature Granulocytes 0 %   Abs Immature Granulocytes 0.03  0.00 - 0.07 K/uL       05/26/2023    2:10 PM 05/18/2023   11:07 AM 04/28/2023    4:04 PM 03/29/2023    3:09 PM 03/22/2023   11:43 AM  Depression screen PHQ 2/9  Decreased Interest 0 0 0 0 0  Down, Depressed, Hopeless 0 0 0 0 0  PHQ - 2 Score 0 0 0 0 0  Altered sleeping   0  0  Tired, decreased energy   0  0  Change in appetite   0  0  Trouble concentrating   0  0  Moving slowly or fidgety/restless   0  0  Suicidal thoughts   0  0  PHQ-9 Score   0  0       05/26/2023    2:10 PM 05/18/2023   11:07 AM 03/29/2023    3:09 PM 03/22/2023   11:43 AM  GAD 7 : Generalized Anxiety Score  Nervous, Anxious, on Edge 0 0 0 0  Control/stop worrying 0 0 0 0  Worry too much - different things 0 0 0 0  Trouble relaxing 0 0 0 0  Restless 0 0 0 0  Easily annoyed or irritable 0 0 0 0  Afraid - awful might happen 0 0  0  Total GAD 7 Score 0 0  0  Anxiety Difficulty Not difficult at all Not difficult at all Not difficult at all Not difficult at all      Pertinent labs & imaging results that were available during my care of the patient were reviewed by me and considered in my medical decision making.  Assessment & Plan:  Blair was seen today for medical management of chronic issues.  Diagnoses and all orders for this  visit:  Hypertension associated with diabetes (HCC) (Primary) Improving. Continue current regimen. Monitor at home with omron. Would like to keep close follow up.   Type 2 diabetes mellitus with hyperglycemia, with long-term current use of insulin  (HCC) Will decrease to 33 units nightly to avoid morning lows. Recommend that patient confirm lows with fingerstick. Labs as below. Will communicate results to patient once available. Will await results to determine next steps.  Continue other medications. Will await recommendations from [facility] for SGLT2i start.  - CMP14+EGFR - Bayer DCA Hb A1c Waived  Long-term (current) use of injectable non-insulin  antidiabetic drugs As above.   CKD (chronic kidney disease) stage 4, GFR 15-29 ml/min (HCC) Continue to follow with Carrolyn Clan, MD. Will await his guidance to start SGLT2i   Simple chronic bronchitis (HCC) Education provided to patient on how to use Breztri  and albuterol . Written instructions provided.   Anemia, unspecified type Labs as below. Will communicate results to patient once available. Will await results to determine next steps.  - Anemia Profile B  Vitamin D  insufficiency Labs as below. Will communicate results to patient once available. Will await results to determine next steps.  - VITAMIN D  25 Hydroxy (Vit-D Deficiency, Fractures)  Continue all other maintenance medications.  Follow up plan: Return in about 4 weeks (around 07/25/2023) for Chronic Condition Follow up.   Continue healthy lifestyle choices, including diet (rich in fruits, vegetables, and lean proteins, and low in salt and simple carbohydrates) and exercise (at least 30 minutes of moderate physical activity daily).  Written and verbal instructions provided   The above assessment and management plan was discussed with the patient. The patient verbalized understanding of and has agreed to the management plan. Patient is  aware to call the clinic if they develop any new  symptoms or if symptoms persist or worsen. Patient is aware when to return to the clinic for a follow-up visit. Patient educated on when it is appropriate to go to the emergency department.   Jacqualyn Mates, DNP-FNP [facility] [address] [place], [place] [address] [phone]

## 2023-06-28 LAB — ANEMIA PROFILE B
Basophils Absolute: 0.1 10*3/uL (ref 0.0–0.2)
Basos: 1 %
EOS (ABSOLUTE): 1.1 10*3/uL — ABNORMAL HIGH (ref 0.0–0.4)
Eos: 10 %
Ferritin: 29 ng/mL (ref 15–150)
Folate: 18.1 ng/mL (ref >3.0)
Hematocrit: 33.6 % — ABNORMAL LOW (ref 34.0–46.6)
Hemoglobin: 10.6 g/dL — ABNORMAL LOW (ref 11.1–15.9)
Immature Grans (Abs): 0 10*3/uL (ref 0.0–0.1)
Immature Granulocytes: 0 %
Iron Saturation: 20 % (ref 15–55)
Iron: 55 ug/dL (ref 27–139)
Lymphocytes Absolute: 2.8 10*3/uL (ref 0.7–3.1)
Lymphs: 27 %
MCH: 26.2 pg — ABNORMAL LOW (ref 26.6–33.0)
MCHC: 31.5 g/dL (ref 31.5–35.7)
MCV: 83 fL (ref 79–97)
Monocytes Absolute: 0.7 10*3/uL (ref 0.1–0.9)
Monocytes: 7 %
Neutrophils Absolute: 5.5 10*3/uL (ref 1.4–7.0)
Neutrophils: 55 %
Platelets: 410 10*3/uL (ref 150–450)
RBC: 4.04 x10E6/uL (ref 3.77–5.28)
RDW: 14.8 % (ref 11.7–15.4)
Retic Ct Pct: 1.7 % (ref 0.6–2.6)
Total Iron Binding Capacity: 276 ug/dL (ref 250–450)
UIBC: 221 ug/dL (ref 118–369)
Vitamin B-12: 619 pg/mL (ref 232–1245)
WBC: 10.1 10*3/uL (ref 3.4–10.8)

## 2023-06-28 LAB — CMP14+EGFR
ALT: 16 IU/L (ref 0–32)
AST: 16 IU/L (ref 0–40)
Albumin: 3.8 g/dL — ABNORMAL LOW (ref 3.9–4.9)
Alkaline Phosphatase: 102 IU/L (ref 44–121)
BUN/Creatinine Ratio: 11 — ABNORMAL LOW (ref 12–28)
BUN: 26 mg/dL (ref 8–27)
Bilirubin Total: 0.2 mg/dL (ref 0.0–1.2)
CO2: 20 mmol/L (ref 20–29)
Calcium: 8.8 mg/dL (ref 8.7–10.3)
Chloride: 108 mmol/L — ABNORMAL HIGH (ref 96–106)
Creatinine, Ser: 2.3 mg/dL — ABNORMAL HIGH (ref 0.57–1.00)
Globulin, Total: 2.6 g/dL (ref 1.5–4.5)
Glucose: 141 mg/dL — ABNORMAL HIGH (ref 70–99)
Potassium: 4.6 mmol/L (ref 3.5–5.2)
Sodium: 141 mmol/L (ref 134–144)
Total Protein: 6.4 g/dL (ref 6.0–8.5)
eGFR: 22 mL/min/{1.73_m2} — ABNORMAL LOW (ref >59)

## 2023-06-28 LAB — VITAMIN D 25 HYDROXY (VIT D DEFICIENCY, FRACTURES): Vit D, 25-Hydroxy: 28.1 ng/mL — ABNORMAL LOW (ref 30.0–100.0)

## 2023-06-29 ENCOUNTER — Encounter: Payer: Self-pay | Admitting: Family Medicine

## 2023-06-29 NOTE — Progress Notes (Signed)
 Hemoglobin and hematocrit stable, iron, folate, and b12 normal. Renal function stable. Continue to follow up with [facility]. Albumin slightly decreased, recommend increased protein in diet. Vitamin D  remains low. Continue 1000-2000 international units daily. A1C almost at goal!

## 2023-07-04 ENCOUNTER — Institutional Professional Consult (permissible substitution): Admitting: Internal Medicine

## 2023-07-15 ENCOUNTER — Other Ambulatory Visit: Payer: Self-pay | Admitting: Family Medicine

## 2023-07-15 DIAGNOSIS — E1159 Type 2 diabetes mellitus with other circulatory complications: Secondary | ICD-10-CM

## 2023-07-18 ENCOUNTER — Other Ambulatory Visit: Payer: Self-pay | Admitting: Urology

## 2023-07-18 DIAGNOSIS — C641 Malignant neoplasm of right kidney, except renal pelvis: Secondary | ICD-10-CM

## 2023-07-19 ENCOUNTER — Other Ambulatory Visit (INDEPENDENT_AMBULATORY_CARE_PROVIDER_SITE_OTHER): Admitting: Pharmacist

## 2023-07-19 DIAGNOSIS — E119 Type 2 diabetes mellitus without complications: Secondary | ICD-10-CM

## 2023-07-19 DIAGNOSIS — Z794 Long term (current) use of insulin: Secondary | ICD-10-CM

## 2023-07-19 NOTE — Progress Notes (Signed)
 07/19/2023 Name: Brooke [name] MRN: [id number] DOB: [birth year]  Chief Complaint  Patient presents with   Diabetes   Hypertension    Brooke [name] is a 68 y.o. year old female who presented for a telephone visit.   They were referred to the pharmacist by their PCP for assistance in managing diabetes, medication access, and complex medication management.    Subjective:  Patient reports her blood pressure is much more controlled and she is enjoying her new BP cuff.  She is tolerating Ozempic  well and now ready to increase her dose.  Care Team: Primary Care Provider: Chrystine Crate, FNP ; Next Scheduled Visit: 07/26/23 Nephro Dr. Carrolyn Clan 09/26/23  Medication Access/Adherence  Current Pharmacy:  [facility] [address] [place] - [address] [place] [address] Phone: [phone] Fax: [phone]  [facility] - [place], [place] - [address] [place] [address] Phone: [phone] Fax: [phone]  [facility] [place], [place] - [address] [place] [address] Phone: [phone] Fax: [phone]  [facility] - [place], [place] - [address] [place] [address] [place] [address] Phone: [phone] Fax: [phone]  [facility] [place], [place] - [address] [place] [address] Phone: [phone] Fax: [phone]  [facility], [place] - [address] [place] [address] Phone: [phone] Fax: [phone]   Patient reports affordability concerns with their medications: Yes  Patient reports access/transportation concerns to their pharmacy: No  Patient reports adherence concerns with their medications:  No     Diabetes:  Current medications: Basaglar  32 units daily-->basaglar  30, Humalog  5-10  (decreased due ozempic ), Ozempic  0.5mg  weekly Medications tried in the past: trulicity , farxiga , metformin , jardiance   Current glucose readings: most FBG<130 Using FSL3 plus CGM Reports some AM lows around 68-69--will decrease basal  Current physical activity: walking  Current medication access support: Novo PAP Ozempic , Lilly PAP--Basaglar , Humalog    Objective:  Lab Results  Component Value Date   HGBA1C 7.2 (H) 06/27/2023    Lab Results  Component Value Date   CREATININE 2.30 (H) 06/27/2023   BUN 26 06/27/2023   NA 141 06/27/2023   K 4.6 06/27/2023   CL 108 (H) 06/27/2023   CO2 20 06/27/2023    Lab Results  Component Value Date   CHOL 155 03/29/2023   HDL 44 03/29/2023   LDLCALC 74 03/29/2023   TRIG 227 (H) 03/29/2023   CHOLHDL 3.5 03/29/2023    Medications Reviewed Today     Reviewed by Delilah Fend, [facility] (Pharmacist) on 07/19/23 at 0955  Med List Status: <None>   Medication Order Taking? Sig Documenting Provider Last Dose Status Informant  acetaminophen  (TYLENOL ) 500 MG tablet [id number] No Take 1,000 mg by mouth every 8 (eight) hours as needed for moderate pain. [provider] Taking Active Self  albuterol  (PROVENTIL ) (2.5 MG/3ML) 0.083% nebulizer solution [id number] No Take 3 mLs (2.5 mg total) by nebulization every 6 (six) hours as needed for wheezing or shortness of breath. Chrystine Crate, FNP Taking Active   albuterol  (VENTOLIN  HFA) 108 (90 Base) MCG/ACT inhaler [id number] No Inhale 2 puffs into the lungs every 6 (six) hours as needed for wheezing or shortness of breath. Chrystine Crate, FNP Taking Active   amLODipine  (  NORVASC ) 10 MG tablet [id number]  Take 1 tablet by mouth once daily [name], Winda Hastings, FNP  Active   Ascorbic Acid (VITA-C PO) [id number] No Take 1 tablet by mouth 2 (two) times a week. [provider] Taking Active Self  aspirin  EC 81 MG tablet [id number] No Take 1 tablet (81 mg total) by mouth daily.  Swallow whole. Bobbetta Burnet, MD Taking Active   atenolol  (TENORMIN ) 100 MG tablet [id number]  TAKE 1 TABLET BY MOUTH IN THE MORNING [name], Winda Hastings, FNP  Active   atorvastatin  (LIPITOR) 80 MG tablet [id number] No Take 1 tablet (80 mg total) by mouth daily. Chrystine Crate, FNP Taking Active   Budeson-Glycopyrrol-Formoterol  (BREZTRI  AEROSPHERE) 160-9-4.8 MCG/ACT AERO [id number] No Inhale 2 puffs into the lungs 2 (two) times daily. Chrystine Crate, FNP Taking Active            Med Note Alpheus Arvin, Donata Fryer   Fri May 19, 2023  8:57 AM) Via [place] patient assistance program   carbamide peroxide (DEBROX) 6.5 % OTIC solution [id number] No Place 5 drops into both ears 2 (two) times daily. Mordecai Applebaum, MD Taking Active   Cholecalciferol 25 MCG (1000 UT) capsule [id number] No Take 1,000 Units by mouth every other day. [provider] Taking Active Self  Continuous Glucose Receiver (FREESTYLE LIBRE 3 READER) DEVI [id number] No 1 Device by Does not apply route as directed. with compatible Freestyle Libre 3 sensor to monitor glucose continuously. DX:E11.65 Chrystine Crate, FNP Taking Active Self  Continuous Glucose Sensor (FREESTYLE LIBRE 3 PLUS SENSOR) MISC [id number] No Change sensor every 15 days. DX: E11.65 Chrystine Crate, FNP Taking Active   hydrALAZINE  (APRESOLINE ) 25 MG tablet [id number] No Take 1 tablet (25 mg total) by mouth in the morning and at bedtime.  Patient taking differently: Take 25 mg by mouth 3 (three) times daily.   Albertha Huger, FNP Taking Active   Insulin  Glargine (BASAGLAR  KWIKPEN) 100 UNIT/ML [id number] No Inject 37 Units into the skin daily.  Patient taking differently: Inject 32 Units into the skin daily.   Chrystine Crate, FNP Taking Active            Med Note Alpheus Arvin, Logen Fowle D   Thu Jun 01, 2023 12:12 PM) Via [employer] patient assistance program    insulin  lispro (HUMALOG  KWIKPEN) 100 UNIT/ML KwikPen [id number] No  Inject 10-15 Units into the skin See admin instructions. 10 units WITH BREAKFAST AND LUNCH, TAKE 15 units at [facility]  Patient taking differently: Inject 5-10 Units into the skin See admin instructions. 10 units WITH BREAKFAST AND LUNCH, TAKE 15 units at [facility], Winda Hastings, FNP Taking Active            Med Note (Eleina Jergens D   Thu Jun 01, 2023 12:12 PM) Via [employer] patient assistance program    Insulin  Pen Needle 31G X 6 MM MISC [id number] No Use to inject insulin  4 times daily as directed. DX: E11.65 Chrystine Crate, FNP Taking Active   Lancet Device MISC [id number] No Check BG prn [name], Winda Hastings, FNP Taking Active   lisinopril  (ZESTRIL ) 40 MG tablet [id number] No Take 1 tablet (40 mg total) by mouth daily. Chrystine Crate, FNP Taking Active   Multiple Vitamin (MULTIVITAMIN ADULT PO) [id number] No Take 1 tablet by mouth every other day. [provider] Taking Active Self  Nebulizer MISC [id number] No USE AS DIRECTED [provider] Taking Active   Semaglutide ,0.25  or 0.5MG /DOS, (OZEMPIC , 0.25 OR 0.5 MG/DOSE,) 2 MG/3ML SOPN [id number] No Inject 0.5 mg into the skin once a week. Getting via novo nordisk PAP [name], Winda Hastings, FNP Taking Active   Med List Note Delilah Fend [facility] 06/09/21 1043): trulicity , basaglar , humalog  escribe to [facility] specialty mail order symbicort -escribe to [facility] mail order jardiance -escribe to [facility] mail order              Assessment/Plan:   Diabetes: - Currently controlled - Reviewed long term cardiovascular and renal outcomes of uncontrolled blood sugar - Reviewed goal A1c, goal fasting, and goal 2 hour post prandial glucose - Recommend to :  DECREASE BASAL TO Basaglar  30-32 units daily  DECREASE Humalog  5-10 units with each meal  Increase Ozempic  to 0.5mg  weekly (shipment arrived)  Received [employer] in the mail - Patient denies personal or family history of multiple endocrine  neoplasia type 2, medullary thyroid  cancer; personal history of pancreatitis or gallbladder disease. - Recommend to check glucose using Libre CGM   Follow Up Plan: PCP 07/26/23  Marvell Slider, PharmD, BCACP, CPP Clinical Pharmacist, [facility]

## 2023-07-24 ENCOUNTER — Ambulatory Visit: Attending: Internal Medicine | Admitting: Internal Medicine

## 2023-07-24 ENCOUNTER — Telehealth: Payer: Self-pay | Admitting: Internal Medicine

## 2023-07-24 ENCOUNTER — Encounter: Payer: Self-pay | Admitting: Internal Medicine

## 2023-07-24 VITALS — BP 168/78 | HR 80 | Ht 65.0 in | Wt 218.8 lb

## 2023-07-24 DIAGNOSIS — I639 Cerebral infarction, unspecified: Secondary | ICD-10-CM

## 2023-07-24 NOTE — Patient Instructions (Addendum)
 Medication Instructions:  Your physician recommends that you continue on your current medications as directed. Please refer to the Current Medication list given to you today.  *If you need a refill on your cardiac medications before your next appointment, please call your pharmacy*  Lab Work: None ordered.  You may go to any [facility] Location for your lab work:  [facility] - [address] ([facility]) - [address] [phone] [address]  [place] - [address]   [place]  - [address]   [place] - [address] - [address] [employer]  - [address] [place] - [address] ([facility]   If you have labs (blood work) drawn today and your tests are completely normal, you will receive your results only by: [employer] (if you have MyChart)  If you have any lab test that is abnormal or we need to change your treatment, we will call you or send a MyChart message to review the results.  Testing/Procedures: Medication Instructions:  Your physician recommends that you continue on your current medications as directed. Please refer to the Current Medication list given to you today.  Labwork: None ordered.  Testing/Procedures: None ordered.  Follow-Up:  Implantable Loop Recorder Placement, Care After This sheet gives you information about how to care for yourself after your procedure. Your health care provider may also give you more specific instructions. If you have problems or questions, contact your health care provider. What can I expect after the procedure? After the procedure, it is common to have: Soreness or discomfort near the incision. Some swelling or bruising near the incision.  Follow these instructions at home: Incision care  Monitor your cardiac device site for redness, swelling, and drainage. Call the device clinic at [phone] if you  experience these symptoms or fever/chills.  Keep the large square bandage on your site for 24 hours and then you may remove it yourself. Keep the steri-strips underneath in place.   You may shower after 72 hours / 3 days from your procedure with the steri-strips in place. They will usually fall off on their own, or may be removed after 10 days. Pat dry.   Avoid lotions, ointments, or perfumes over your incision until it is well-healed.  Please do not submerge in water  until your site is completely healed.   Your device is MRI compatible.   Remote monitoring is used to monitor your cardiac device from home. This monitoring is scheduled every month by our office. It allows us  to keep an eye on the function of your device to ensure it is working properly.  If your wound site starts to bleed apply pressure.    For help with the monitor please call Medtronic Monitor Support Specialist directly at [phone].    If you have any questions/concerns please call the device clinic at [phone].  Activity  Return to your normal activities.  General instructions Follow instructions from your health care provider about how to manage your implantable loop recorder and transmit the information. Learn how to activate a recording if this is necessary for your type of device. You may go through a metal detection gate, and you may let someone hold a metal detector over your chest. Show your ID card if needed. Do not have an MRI unless you check with your health care provider first. Take over-the-counter and  prescription medicines only as told by your health care provider. Keep all follow-up visits as told by your health care provider. This is important. Contact a health care provider if: You have redness, swelling, or pain around your incision. You have a fever. You have pain that is not relieved by your pain medicine. You have triggered your device because of fainting (syncope) or because of a  heartbeat that feels like it is racing, slow, fluttering, or skipping (palpitations). Get help right away if you have: Chest pain. Difficulty breathing. Summary After the procedure, it is common to have soreness or discomfort near the incision. Change your dressing as told by your health care provider. Follow instructions from your health care provider about how to manage your implantable loop recorder and transmit the information. Keep all follow-up visits as told by your health care provider. This is important. This information is not intended to replace advice given to you by your health care provider. Make sure you discuss any questions you have with your health care provider. Document Released: 02/02/2015 Document Revised: 04/08/2017 Document Reviewed: 04/08/2017 Elsevier Patient Education  2020 [employer].   Follow-Up: At [facility], you and your health needs are our priority.  As part of our continuing mission to provide you with exceptional heart care, we have created designated Provider Care Teams.  These Care Teams include your primary Cardiologist (physician) and Advanced Practice Providers (APPs -  Physician Assistants and Nurse Practitioners) who all work together to provide you with the care you need, when you need it.  Your next appointment:   6 months  The format for your next appointment:   In Person  Provider:   Manya Sells, MD{or one of the following Advanced Practice Providers on your designated Care Team:   Mertha Abrahams, [place] [address] [place], [place] Neda Balk, NP

## 2023-07-24 NOTE — Telephone Encounter (Signed)
 Called and spoke with the patient. Advised she may take tylenol  for pain at ILR site- implant done today by Dr. Carolynne Citron.   The patient voices understanding and is agreeable.

## 2023-07-24 NOTE — Progress Notes (Addendum)
 HPI Ms. 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 She is a pleasant 68 yo woman with DM and HTN who had a posterior circulation stroke in 10/24. The patient has recovered nicely. She wore a cardiac monitor which did not show afib. She is referred for ILR insertion due to cryptogenic stroke. Allergies  Allergen Reactions   Jardiance  [Empagliflozin ]     Holding per nephro, pt reports no allergy, Dr just didn't want pt taking it   Kerendia  [Finerenone ]     Holding per nephro, pt reports no allergy, Dr just didn't want pt taking it   Metformin  And Related     Contraindicated due to GFR; GI side effects Pt states not a real allergy     Current Outpatient Medications  Medication Sig Dispense Refill   acetaminophen  (TYLENOL ) 500 MG tablet Take 1,000 mg by mouth every 8 (eight) hours as needed for moderate pain.     albuterol  (PROVENTIL ) (2.5 MG/3ML) 0.083% nebulizer solution Take 3 mLs (2.5 mg total) by nebulization every 6 (six) hours as needed for wheezing or shortness of breath. 150 mL 1   albuterol  (VENTOLIN  HFA) 108 (90 Base) MCG/ACT inhaler Inhale 2 puffs into the lungs every 6 (six) hours as needed for wheezing or shortness of breath. 18 g 2   amLODipine  (NORVASC ) 10 MG tablet Take 1 tablet by mouth once daily 90 tablet 0   Ascorbic Acid (VITA-C PO) Take 1 tablet by mouth 2 (two) times a week.     aspirin  EC 81 MG tablet Take 1 tablet (81 mg total) by mouth daily. Swallow whole. 30 tablet 12   atenolol  (TENORMIN ) 100 MG tablet TAKE 1 TABLET BY MOUTH IN THE MORNING 90 tablet 0   atorvastatin  (LIPITOR) 80 MG tablet Take 1 tablet (80 mg total) by mouth daily. 90 tablet 0   Budeson-Glycopyrrol-Formoterol  (BREZTRI  AEROSPHERE) 160-9-4.8 MCG/ACT AERO Inhale 2 puffs into the lungs 2 (two) times daily. 32.1 g 5   carbamide peroxide (DEBROX) 6.5 % OTIC solution Place 5 drops into both ears 2 (two) times daily. 15 mL 0   Cholecalciferol 25 MCG (1000 UT)  capsule Take 1,000 Units by mouth every other day.     Continuous Glucose Receiver (FREESTYLE LIBRE 3 READER) DEVI 1 Device by Does not apply route as directed. with compatible Freestyle Libre 3 sensor to monitor glucose continuously. DX:E11.65 1 each 1   Continuous Glucose Sensor (FREESTYLE LIBRE 3 PLUS SENSOR) MISC Change sensor every 15 days. DX: E11.65 6 each 3   hydrALAZINE  (APRESOLINE ) 25 MG tablet Take 1 tablet (25 mg total) by mouth in the morning and at bedtime. (Patient taking differently: Take 25 mg by mouth 3 (three) times daily.) 180 tablet 0   Insulin  Glargine (BASAGLAR  KWIKPEN) 100 UNIT/ML Inject 37 Units into the skin daily. (Patient taking differently: Inject 32 Units into the skin daily.) 45 mL 3   insulin  lispro (HUMALOG  KWIKPEN) 100 UNIT/ML KwikPen Inject 10-15 Units into the skin See admin instructions. 10 units WITH BREAKFAST AND LUNCH, TAKE 15 units at [facility] (Patient taking differently: Inject 5-10 Units into the skin See admin instructions. 10 units WITH BREAKFAST AND LUNCH, TAKE 15 units at [facility]) 45 mL 5   Insulin  Pen Needle 31G X 6 MM MISC Use to inject insulin  4 times daily as directed. DX: E11.65 400 each 11   Lancet Device MISC Check BG prn 100 each 12   lisinopril  (ZESTRIL ) 40 MG tablet  Take 1 tablet (40 mg total) by mouth daily. 90 tablet 3   Multiple Vitamin (MULTIVITAMIN ADULT PO) Take 1 tablet by mouth every other day.     Nebulizer MISC USE AS DIRECTED     Semaglutide ,0.25 or 0.5MG /DOS, (OZEMPIC , 0.25 OR 0.5 MG/DOSE,) 2 MG/3ML SOPN Inject 0.5 mg into the skin once a week. Getting via novo nordisk PAP     No current facility-administered medications for this visit.     Past Medical History:  Diagnosis Date   Anemia    Arthritis    Bronchitis    frequent bronchitis   Diabetes mellitus    Diabetic nephropathy (HCC)    Hepatic steatosis    History of renal cell carcinoma 08/04/2021   Clear cell, nuclear grade 4, size 5.3 cm, S/P nephrectomy   History of  right nephrectomy 09/22/2021   Hypertension    Hyperuricemia 11/27/2020   Obesity (BMI 35.0-39.9 without comorbidity)    Renal mass 08/04/2021   Vertigo    Vitamin D  insufficiency 11/27/2020    ROS:   All systems reviewed and negative except as noted in the HPI.   Past Surgical History:  Procedure Laterality Date   BREAST SURGERY Right 1974   OPERATIVE ULTRASOUND N/A 08/04/2021   Procedure: OPERATIVE ULTRASOUND;  Surgeon: Osborn Blaze, MD;  Location: [facility];  Service: Urology;  Laterality: N/A;   ROBOTIC ASSITED PARTIAL NEPHRECTOMY Right 08/04/2021   Procedure: XI ROBOTIC ASSITED RADICAL NEPHRECTOMY;  Surgeon: Osborn Blaze, MD;  Location: [facility];  Service: Urology;  Laterality: Right;     Family History  Problem Relation Age of Onset   Diabetes Mother    Congestive Heart Failure Mother    Stroke Mother    Kidney disease Father    Diabetes Sister    Hypertension Sister    Cervical cancer Sister    Diabetes Brother    Hypertension Daughter      Social History   Socioeconomic History   Marital status: Married    Spouse name: Donnie   Number of children: 1   Years of education: Not on file   Highest education level: Not on file  Occupational History   Not on file  Tobacco Use   Smoking status: Never   Smokeless tobacco: Never  Vaping Use   Vaping status: Never Used  Substance and Sexual Activity   Alcohol use: No   Drug use: No   Sexual activity: Never  Other Topics Concern   Not on file  Social History Narrative   1 daughter    1 grandson      Lives with husband    Retired    Chief Executive Officer Drivers of Corporate investment banker 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  (04/28/2023)   Overall Financial Resource Strain (CARDIA)    Difficulty of Paying Living Expenses: Not hard at all  Food Insecurity: No Food Insecurity (04/28/2023)   Hunger Vital Sign    Worried About Running Out of Food in the Last Year: Never true    Ran Out of Food in the Last Year: Never true   Transportation Needs: No Transportation Needs (04/28/2023)   PRAPARE - Administrator, Civil Service (Medical): No    Lack of Transportation (Non-Medical): No  Physical Activity: Inactive (04/28/2023)   Exercise Vital Sign    Days of Exercise per Week: 0 days    Minutes of Exercise per Session: 0 min  Stress: No Stress Concern Present (04/28/2023)   [employer] of  Occupational Health - Occupational Stress Questionnaire    Feeling of Stress : Not at all  Social Connections: Moderately Integrated (04/28/2023)   Social Connection and Isolation Panel [NHANES]    Frequency of Communication with Friends and Family: More than three times a week    Frequency of Social Gatherings with Friends and Family: Three times a week    Attends Religious Services: More than 4 times per year    Active Member of Clubs or Organizations: No    Attends Banker Meetings: Never    Marital Status: Married  Catering manager Violence: Not At Risk (04/28/2023)   Humiliation, Afraid, Rape, and Kick questionnaire    Fear of Current or Ex-Partner: No    Emotionally Abused: No    Physically Abused: No    Sexually Abused: No     Ht 5' 5 (1.651 m)   Wt 218 lb 12.8 oz (99.2 kg)   LMP 03/16/2012   BMI 36.41 kg/m   Physical Exam:  stable appearing NAD HEENT: Unremarkable Neck:  No JVD, no thyromegally Lymphatics:  No adenopathy Back:  No CVA tenderness Lungs:  Clear with no wheezes HEART:  Regular rate rhythm, no murmurs, no rubs, no clicks Abd:  soft, positive bowel sounds, no organomegally, no rebound, no guarding Ext:  2 plus pulses, no edema, no cyanosis, no clubbing Skin:  No rashes no nodules Neuro:  CN II through XII intact, motor grossly intact  Assess/Plan: Cryptogenic stroke - I have discussed the indications/risks/benefits/goals/expectations of ILR insertion with the patient and her daughter and she wishes to proceed. HTN - her bp is elevated today but she has some  white coat HTN.  Obesity - she has started Ozempic  but has not yet lost any weight.   EP Procedure Note  Procedure performed: ILR insertion.  Preop diagnosis: cryptogenic stroke.  Postop diagnosis: same as preop  Description of the procedure: after informed consent was obtained and the appropriate time out performed, the patient was prepped and draped in a sterile manner. 5 cc of lidocaine  was infiltrated. A one cm stab incision was carried out. The Medtronic ILR, [id number] was inserted. The R waves measured 0.12mV. P waves were easily seen. Pressure was held. Benzoin and steristrips were painted on the skin and a bandage applied and the patient recovered in the usual manner.  Complications: none immediately.  Conclusion: successful ILR insertion.   Pete Brand Dontae Minerva,MD

## 2023-07-24 NOTE — Telephone Encounter (Signed)
  1. Has your device fired? No   2. Is you device beeping? No   3. Are you experiencing draining or swelling at device site? N/A  4. Are you calling to see if we received your device transmission? No  5. Have you passed out? No  Patient is wanting to know if it is okay for her to take tylenol  for the pain of having her loop recorder implanted. Please advise.    Please route to Device Clinic Pool

## 2023-07-26 ENCOUNTER — Encounter: Payer: Self-pay | Admitting: Family Medicine

## 2023-07-26 ENCOUNTER — Ambulatory Visit (INDEPENDENT_AMBULATORY_CARE_PROVIDER_SITE_OTHER): Admitting: Family Medicine

## 2023-07-26 VITALS — BP 143/66 | HR 67 | Temp 98.2°F | Ht 65.0 in | Wt 220.0 lb

## 2023-07-26 DIAGNOSIS — E1165 Type 2 diabetes mellitus with hyperglycemia: Secondary | ICD-10-CM | POA: Diagnosis not present

## 2023-07-26 DIAGNOSIS — E785 Hyperlipidemia, unspecified: Secondary | ICD-10-CM

## 2023-07-26 DIAGNOSIS — E1159 Type 2 diabetes mellitus with other circulatory complications: Secondary | ICD-10-CM | POA: Diagnosis not present

## 2023-07-26 DIAGNOSIS — I152 Hypertension secondary to endocrine disorders: Secondary | ICD-10-CM

## 2023-07-26 DIAGNOSIS — E1169 Type 2 diabetes mellitus with other specified complication: Secondary | ICD-10-CM

## 2023-07-26 DIAGNOSIS — Z8673 Personal history of transient ischemic attack (TIA), and cerebral infarction without residual deficits: Secondary | ICD-10-CM

## 2023-07-26 DIAGNOSIS — Z794 Long term (current) use of insulin: Secondary | ICD-10-CM | POA: Diagnosis not present

## 2023-07-26 DIAGNOSIS — J42 Unspecified chronic bronchitis: Secondary | ICD-10-CM | POA: Diagnosis not present

## 2023-07-26 MED ORDER — MONTELUKAST SODIUM 10 MG PO TABS: 10.0000 mg | ORAL_TABLET | Freq: Every day | ORAL | 3 refills | Status: DC

## 2023-07-26 MED ORDER — AMLODIPINE BESYLATE 10 MG PO TABS: 10.0000 mg | ORAL_TABLET | Freq: Every day | ORAL | 0 refills | Status: DC

## 2023-07-26 MED ORDER — ATORVASTATIN CALCIUM 80 MG PO TABS: 80.0000 mg | ORAL_TABLET | Freq: Every day | ORAL | 0 refills | Status: DC

## 2023-07-26 MED ORDER — HYDRALAZINE HCL 25 MG PO TABS: 25.0000 mg | ORAL_TABLET | Freq: Three times a day (TID) | ORAL | 1 refills | Status: AC

## 2023-07-26 MED ORDER — ATENOLOL 100 MG PO TABS: 100.0000 mg | ORAL_TABLET | Freq: Every morning | ORAL | 0 refills | Status: DC

## 2023-07-26 NOTE — Patient Instructions (Signed)
 [facility]. [address] [place] [address] [phone]

## 2023-07-26 NOTE — Progress Notes (Signed)
 Subjective:  Patient ID: Brooke [name], female    DOB: 02-21-1956, 68 y.o.   MRN: [id number]  Patient Care Team: Chrystine Crate, FNP as PCP - General (Family Medicine) Delilah Fend, [facility] (Pharmacist) Alexia Idler, [place] (Optometry) Candi Chafe, Pearletha Bouche, MD as Consulting Physician (Ophthalmology) Jane Meager, MD as Referring Physician (Nephrology)   Chief Complaint:  Medical Management of Chronic Issues (4 week follow up/Med check/)  HPI: Brooke [name] is a 68 y.o. female presenting on 07/26/2023 for Medical Management of Chronic Issues (4 week follow up/Med check/)  HPI Patient is here today for HTN follow up.  Has BP monitor at home Yes BP at home average 130-150/60-70s  ROS Denies anxiety, fatigue, chest pain, headaches, palpitations, sweats, PND, orthopnea Endorses some peripheral edema, mainly with sitting and on hot days. States that it resolves with elevation.  States that vision gets "weary". She missed her last eye exam. She was due for it last month.  Reports that she has some SOB while she is walking during the day. She is using breztri . Only using albuterol  as needed. Not using it as much, states that she is still using it 1-2 times per day.  Meds lisinopril  40 mg  Hydralazine  25 mg TID  Atenolol  100 mg daily  Amlodipine  10 mg daily  CAD risks Diabetes Mellitus, hypertension, hypercholesterolemia/hyperlipidemia She is established with Cardiology and with Nephrology   T2DM  States that she is still having some lows. She is dropping to 69 -80 in the mornings and even after meal.   COPD  States that she has not followed with pulmonology. States that she is still wheezing and short of breath on occasion. She reports compliance with breztri  and using albuterol  1-2 times per day. She is not using nebulizer.   Relevant past medical, surgical, family, and social history reviewed and updated as indicated.  Allergies and medications reviewed and  updated. Data reviewed: Chart in Epic.   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   Hypertension    Hyperuricemia 11/27/2020   Obesity (BMI 35.0-39.9 without comorbidity)    Renal mass 08/04/2021   Vertigo    Vitamin D  insufficiency 11/27/2020    Past Surgical History:  Procedure Laterality Date   BREAST SURGERY Right 1974   OPERATIVE ULTRASOUND N/A 08/04/2021   Procedure: OPERATIVE ULTRASOUND;  Surgeon: Osborn Blaze, MD;  Location: [facility];  Service: Urology;  Laterality: N/A;   ROBOTIC ASSITED PARTIAL NEPHRECTOMY Right 08/04/2021   Procedure: XI ROBOTIC ASSITED RADICAL NEPHRECTOMY;  Surgeon: Osborn Blaze, MD;  Location: [facility];  Service: Urology;  Laterality: Right;    Social History   Socioeconomic History   Marital status: Married    Spouse name: Donnie   Number of children: 1   Years of education: Not on file   Highest education level: Not on file  Occupational History   Not on file  Tobacco Use   Smoking status: Never   Smokeless tobacco: Never  Vaping Use   Vaping status: Never Used  Substance and Sexual Activity   Alcohol use: No   Drug use: No   Sexual activity: Never  Other Topics Concern   Not on file  Social History Narrative   1 daughter    1 grandson      Lives with husband    Retired    Chief Executive Officer Drivers of Corporate investment banker 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  (04/28/2023)   Overall Financial Resource Strain (CARDIA)    Difficulty of Paying Living Expenses: Not hard at all  Food Insecurity: No Food Insecurity (04/28/2023)   Hunger Vital Sign    Worried About Running Out of Food in the Last Year: Never true    Ran Out of Food in the Last Year: Never true  Transportation  Needs: No Transportation Needs (04/28/2023)   PRAPARE - Administrator, Civil Service (Medical): No    Lack of Transportation (Non-Medical): No  Physical Activity: Inactive (04/28/2023)   Exercise Vital Sign    Days of Exercise per Week: 0 days    Minutes of Exercise per Session: 0 min  Stress: No Stress Concern Present (04/28/2023)   [employer] of Occupational Health - Occupational Stress Questionnaire    Feeling of Stress : Not at all  Social Connections: Moderately Integrated (04/28/2023)   Social Connection and Isolation Panel [NHANES]    Frequency of Communication with Friends and Family: More than three times a week    Frequency of Social Gatherings with Friends and Family: Three times a week    Attends Religious Services: More than 4 times per year    Active Member of Clubs or Organizations: No    Attends Banker Meetings: Never    Marital Status: Married  Catering manager Violence: Not At Risk (04/28/2023)   Humiliation, Afraid, Rape, and Kick questionnaire    Fear of Current or Ex-Partner: No    Emotionally Abused: No    Physically Abused: No    Sexually Abused: No    Outpatient Encounter Medications as of 07/26/2023  Medication Sig   acetaminophen  (TYLENOL ) 500 MG tablet Take 1,000 mg by mouth every 8 (eight) hours as needed for moderate pain.   albuterol  (PROVENTIL ) (2.5 MG/3ML) 0.083% nebulizer solution Take 3 mLs (2.5 mg total) by nebulization every 6 (six) hours as needed for wheezing or shortness of breath.   albuterol  (VENTOLIN  HFA) 108 (90 Base) MCG/ACT inhaler Inhale 2 puffs into the lungs every 6 (six) hours as needed for wheezing or shortness of breath.   amLODipine  (NORVASC ) 10 MG tablet Take 1 tablet by mouth once daily   Ascorbic Acid (VITA-C PO) Take 1 tablet by mouth 2 (two) times a week.   aspirin  EC 81 MG tablet Take 1 tablet (81 mg total) by mouth daily. Swallow whole.   atenolol  (TENORMIN ) 100 MG tablet TAKE 1 TABLET BY MOUTH IN  THE MORNING   atorvastatin  (LIPITOR) 80 MG tablet Take 1 tablet (80 mg total) by mouth daily.   Budeson-Glycopyrrol-Formoterol  (BREZTRI  AEROSPHERE) 160-9-4.8 MCG/ACT AERO Inhale 2 puffs into the lungs 2 (two) times daily.   carbamide peroxide (DEBROX) 6.5 % OTIC solution Place 5 drops into both ears 2 (two) times daily.   Cholecalciferol 25 MCG (1000 UT) capsule Take 1,000 Units by mouth every other day.   Continuous Glucose Receiver (FREESTYLE LIBRE 3 READER) DEVI 1 Device by Does not apply route as directed. with compatible Freestyle Libre 3 sensor to monitor glucose continuously. DX:E11.65   Continuous Glucose Sensor (FREESTYLE LIBRE 3 PLUS SENSOR) MISC Change sensor every 15 days. DX: E11.65   hydrALAZINE  (APRESOLINE ) 25 MG tablet Take 1  tablet (25 mg total) by mouth in the morning and at bedtime. (Patient taking differently: Take 25 mg by mouth 3 (three) times daily.)   Insulin  Glargine (BASAGLAR  KWIKPEN) 100 UNIT/ML Inject 37 Units into the skin daily. (Patient taking differently: Inject 32 Units into the skin daily.)   insulin  lispro (HUMALOG  KWIKPEN) 100 UNIT/ML KwikPen Inject 10-15 Units into the skin See admin instructions. 10 units WITH BREAKFAST AND LUNCH, TAKE 15 units at [facility] (Patient taking differently: Inject 5-10 Units into the skin See admin instructions. 10 units WITH BREAKFAST AND LUNCH, TAKE 15 units at [facility])   Insulin  Pen Needle 31G X 6 MM MISC Use to inject insulin  4 times daily as directed. DX: E11.65   Lancet Device MISC Check BG prn   lisinopril  (ZESTRIL ) 40 MG tablet Take 1 tablet (40 mg total) by mouth daily.   Multiple Vitamin (MULTIVITAMIN ADULT PO) Take 1 tablet by mouth every other day.   Nebulizer MISC USE AS DIRECTED   Semaglutide ,0.25 or 0.5MG /DOS, (OZEMPIC , 0.25 OR 0.5 MG/DOSE,) 2 MG/3ML SOPN Inject 0.5 mg into the skin once a week. Getting via novo nordisk PAP   No facility-administered encounter medications on file as of 07/26/2023.    Allergies  Allergen  Reactions   Jardiance  [Empagliflozin ]     Holding per nephro, pt reports no allergy, Dr just didn't want pt taking it   Kerendia  [Finerenone ]     Holding per nephro, pt reports no allergy, Dr just didn't want pt taking it   Metformin  And Related     Contraindicated due to GFR; GI side effects Pt states not a real allergy    Review of Systems As per HPI  Objective:  BP (!) 146/68   Pulse 67   Temp 98.2 F (36.8 C)   Ht 5\' 5"  (1.651 m)   Wt 220 lb (99.8 kg)   LMP 03/16/2012   SpO2 96%   BMI 36.61 kg/m    Wt Readings from Last 3 Encounters:  07/26/23 220 lb (99.8 kg)  07/24/23 218 lb 12.8 oz (99.2 kg)  06/27/23 218 lb (98.9 kg)   Physical Exam Constitutional:      General: She is awake. She is not in acute distress.    Appearance: Normal appearance. She is well-developed and well-groomed. She is obese. She is not ill-appearing, toxic-appearing or diaphoretic.  Cardiovascular:     Rate and Rhythm: Normal rate and regular rhythm.     Pulses: Normal pulses.          Radial pulses are 2+ on the right side and 2+ on the left side.       Posterior tibial pulses are 2+ on the right side and 2+ on the left side.     Heart sounds: Normal heart sounds. No murmur heard.    No gallop.     Comments: Trace edema LLE  Pulmonary:     Effort: Pulmonary effort is normal. No respiratory distress.     Breath sounds: No stridor. Examination of the right-upper field reveals wheezing. Examination of the left-upper field reveals wheezing. Examination of the right-middle field reveals wheezing. Examination of the left-middle field reveals wheezing. Wheezing present. No rhonchi or rales.  Musculoskeletal:     Cervical back: Full passive range of motion without pain and neck supple.     Right lower leg: 1+ Edema present.     Left lower leg: Edema present.  Skin:    General: Skin is warm.     Capillary  Refill: Capillary refill takes less than 2 seconds.  Neurological:     General: No focal  deficit present.     Mental Status: She is alert, oriented to person, place, and time and easily aroused. Mental status is at baseline.     GCS: GCS eye subscore is 4. GCS verbal subscore is 5. GCS motor subscore is 6.     Motor: No weakness.  Psychiatric:        Attention and Perception: Attention and perception normal.        Mood and Affect: Mood and affect normal.        Speech: Speech normal.        Behavior: Behavior normal. Behavior is cooperative.        Thought Content: Thought content normal. Thought content does not include homicidal or suicidal ideation. Thought content does not include homicidal or suicidal plan.        Cognition and Memory: Cognition and memory normal.        Judgment: Judgment normal.     Results for orders placed or performed in visit on 06/27/23  Bayer DCA Hb A1c Waived   Collection Time: 06/27/23  3:40 PM  Result Value Ref Range   HB A1C (BAYER DCA - WAIVED) 7.2 (H) 4.8 - 5.6 %  Anemia Profile B   Collection Time: 06/27/23  3:41 PM  Result Value Ref Range   Total Iron Binding Capacity 276 250 - 450 ug/dL   UIBC 161 096 - 045 ug/dL   Iron 55 27 - 409 ug/dL   Iron Saturation 20 15 - 55 %   Ferritin 29 15 - 150 ng/mL   Vitamin B-12 619 232 - 1,245 pg/mL   Folate 18.1 >3.0 ng/mL   WBC 10.1 3.4 - 10.8 x10E3/uL   RBC 4.04 3.77 - 5.28 x10E6/uL   Hemoglobin 10.6 (L) 11.1 - 15.9 g/dL   Hematocrit 81.1 (L) 91.4 - 46.6 %   MCV 83 79 - 97 fL   MCH 26.2 (L) 26.6 - 33.0 pg   MCHC 31.5 31.5 - 35.7 g/dL   RDW 78.2 95.6 - 21.3 %   Platelets 410 150 - 450 x10E3/uL   Neutrophils 55 Not Estab. %   Lymphs 27 Not Estab. %   Monocytes 7 Not Estab. %   Eos 10 Not Estab. %   Basos 1 Not Estab. %   Neutrophils Absolute 5.5 1.4 - 7.0 x10E3/uL   Lymphocytes Absolute 2.8 0.7 - 3.1 x10E3/uL   Monocytes Absolute 0.7 0.1 - 0.9 x10E3/uL   EOS (ABSOLUTE) 1.1 (H) 0.0 - 0.4 x10E3/uL   Basophils Absolute 0.1 0.0 - 0.2 x10E3/uL   Immature Granulocytes 0 Not Estab. %    Immature Grans (Abs) 0.0 0.0 - 0.1 x10E3/uL   Retic Ct Pct 1.7 0.6 - 2.6 %  CMP14+EGFR   Collection Time: 06/27/23  3:41 PM  Result Value Ref Range   Glucose 141 (H) 70 - 99 mg/dL   BUN 26 8 - 27 mg/dL   Creatinine, Ser 0.86 (H) 0.57 - 1.00 mg/dL   eGFR 22 (L) >57 QI/ONG/2.95   BUN/Creatinine Ratio 11 (L) 12 - 28   Sodium 141 134 - 144 mmol/L   Potassium 4.6 3.5 - 5.2 mmol/L   Chloride 108 (H) 96 - 106 mmol/L   CO2 20 20 - 29 mmol/L   Calcium  8.8 8.7 - 10.3 mg/dL   Total Protein 6.4 6.0 - 8.5 g/dL   Albumin 3.8 (L) 3.9 -  4.9 g/dL   Globulin, Total 2.6 1.5 - 4.5 g/dL   Bilirubin Total 0.2 0.0 - 1.2 mg/dL   Alkaline Phosphatase 102 44 - 121 IU/L   AST 16 0 - 40 IU/L   ALT 16 0 - 32 IU/L  VITAMIN D  25 Hydroxy (Vit-D Deficiency, Fractures)   Collection Time: 06/27/23  3:41 PM  Result Value Ref Range   Vit D, 25-Hydroxy 28.1 (L) 30.0 - 100.0 ng/mL       07/26/2023    2:02 PM 05/26/2023    2:10 PM 05/18/2023   11:07 AM 04/28/2023    4:04 PM 03/29/2023    3:09 PM  Depression screen PHQ 2/9  Decreased Interest 0 0 0 0 0  Down, Depressed, Hopeless 0 0 0 0 0  PHQ - 2 Score 0 0 0 0 0  Altered sleeping 0   0   Tired, decreased energy 0   0   Change in appetite 0   0   Feeling bad or failure about yourself  0      Trouble concentrating 0   0   Moving slowly or fidgety/restless 0   0   Suicidal thoughts 0   0   PHQ-9 Score 0   0   Difficult doing work/chores Not difficult at all           07/26/2023    2:01 PM 05/26/2023    2:10 PM 05/18/2023   11:07 AM 03/29/2023    3:09 PM  GAD 7 : Generalized Anxiety Score  Nervous, Anxious, on Edge 0 0 0 0  Control/stop worrying 0 0 0 0  Worry too much - different things 0 0 0 0  Trouble relaxing 0 0 0 0  Restless 0 0 0 0  Easily annoyed or irritable 0 0 0 0  Afraid - awful might happen 0 0 0   Total GAD 7 Score 0 0 0   Anxiety Difficulty Not difficult at all Not difficult at all Not difficult at all Not difficult at all   Pertinent  labs & imaging results that were available during my care of the patient were reviewed by me and considered in my medical decision making.  Assessment & Plan:  Brooke [name] was seen today for medical management of chronic issues.  Diagnoses and all orders for this visit:  1. Hypertension associated with diabetes (HCC) (Primary) Slightly above goal. Reviewed notes from Cardiology.  Recommend patient continue to follow up with Cardiology and nephrology.  - amLODipine  (NORVASC ) 10 MG tablet; Take 1 tablet (10 mg total) by mouth daily.  Dispense: 90 tablet; Refill: 0 - atenolol  (TENORMIN ) 100 MG tablet; Take 1 tablet (100 mg total) by mouth every morning.  Dispense: 90 tablet; Refill: 0 - hydrALAZINE  (APRESOLINE ) 25 MG tablet; Take 1 tablet (25 mg total) by mouth 3 (three) times daily.  Dispense: 270 tablet; Refill: 1  2. Hyperlipidemia associated with type 2 diabetes mellitus (HCC) Tolerating well. Refill provided.  - atorvastatin  (LIPITOR) 80 MG tablet; Take 1 tablet (80 mg total) by mouth daily.  Dispense: 90 tablet; Refill: 0  3. Chronic bronchitis, unspecified chronic bronchitis type (HCC) Recommend for pt to follow up with specialty. Will start medication as below. Provided contact information to patient to contact pulmonology. Declined nebulizer in office.  - montelukast (SINGULAIR) 10 MG tablet; Take 1 tablet (10 mg total) by mouth at bedtime.  Dispense: 30 tablet; Refill: 3  4. Type 2 diabetes mellitus with hyperglycemia, with long-term  current use of insulin  [facility]) Reviewed Jerrilyn Moras report with patient. Only 2 low BG readings in last 2 weeks. Recommend for patient to decrease to 30 units of basaglar  nightly. Recommend 5-10 units of humalog  with meals.  5. History of CVA (cerebrovascular accident) Refill provided. Tolerating.  - atorvastatin  (LIPITOR) 80 MG tablet; Take 1 tablet (80 mg total) by mouth daily.  Dispense: 90 tablet; Refill: 0  Continue all other maintenance  medications.  Follow up plan: Return in about 3 months (around 10/26/2023) for Chronic Condition Follow up.  Continue healthy lifestyle choices, including diet (rich in fruits, vegetables, and lean proteins, and low in salt and simple carbohydrates) and exercise (at least 30 minutes of moderate physical activity daily).  Written and verbal instructions provided   The above assessment and management plan was discussed with the patient. The patient verbalized understanding of and has agreed to the management plan. Patient is aware to call the clinic if they develop any new symptoms or if symptoms persist or worsen. Patient is aware when to return to the clinic for a follow-up visit. Patient educated on when it is appropriate to go to the emergency department.   Jacqualyn Mates, DNP-FNP [facility] [address] [place], [place] [address] [phone]

## 2023-08-03 ENCOUNTER — Telehealth: Payer: Self-pay

## 2023-08-03 ENCOUNTER — Telehealth: Payer: Self-pay | Admitting: Pharmacist

## 2023-08-03 NOTE — Telephone Encounter (Signed)
 Copied from CRM [id number]. Topic: Clinical - Medication Question >> Aug 03, 2023  4:42 PM Brooke [name] R wrote: Pt returning a call she's not sure what is was for but she has a medication question.

## 2023-08-03 NOTE — Telephone Encounter (Signed)
 Received message that patient was unsure how to take her Ozempic  as her pen did not have the 0.5 mg dose she is supposed to be taking on it. Called her to discuss and she currently has the Ozempic  1 mg pen that she received through [facility] PAP. She says yesterday she was due for her injection and saw the pen only had a 1 mg dose label on it so she counted the number of clicks and dialed the pen up 1/2 way (~30 clicks) to give herself a 0.5 mg dose. She reports she is feeling well today. Denies n/v, diarrhea, constipation. Says her BG has been stable. Will have her continue for now (counseled that a 0.5 mg dose from Ozempic  1 mg pen is ~37 clicks). Likely will be able to increase to 1 mg in 3 weeks. She will reach out to us  if she has any issues or concerns.  Georga Killings, PharmD PGY-1 Pharmacy Resident

## 2023-08-04 NOTE — Telephone Encounter (Signed)
 I called pt and spoke to her but she states she spoke with someone yesterday. It looks like it was a Engineer, petroleum, please see other telephone encounter. Will close this one.

## 2023-08-18 ENCOUNTER — Telehealth: Payer: Self-pay

## 2023-08-22 ENCOUNTER — Telehealth: Payer: Self-pay | Admitting: Pharmacy Technician

## 2023-08-22 NOTE — Progress Notes (Addendum)
   08/22/2023  Patient ID: Winifred Haven, female   DOB: Mar 27, 1955, 68 y.o.   MRN: [id number]  Patient engaged with clinical pharmacist for management of diabetes on 07/19/2023. Outreach by [employer] technician was requested.   Outreached patient to discuss diabetes medication management. Left voicemail for patient to return my call at their convenience.     ADDENDUM 08/22/2023 Patient returned the call and left voicemail message. Patient informed in the message that she was doing pretty good, trying to keep  blood pressure intact. She was able to reach someone at  the office who told her how to get 0.5mg  Ozempic  from a 1mg  pen. She informs it makes her sick after she takes it for a couple of days and then she gets better. She informs blood sugar is doing pretty good.  Attempted to call patient back and received voicemail.   Byrne Capek, CPhT [facility] Office: [phone] Email: [email or web address]

## 2023-08-23 ENCOUNTER — Ambulatory Visit
Admission: RE | Admit: 2023-08-23 | Discharge: 2023-08-23 | Disposition: A | Discharge status: Home / Self Care | Source: Ambulatory Visit | Attending: Urology | Admitting: Urology

## 2023-08-23 DIAGNOSIS — I700 Atherosclerosis of aorta: Secondary | ICD-10-CM | POA: Diagnosis not present

## 2023-08-23 DIAGNOSIS — Z85528 Personal history of other malignant neoplasm of kidney: Secondary | ICD-10-CM | POA: Diagnosis not present

## 2023-08-23 DIAGNOSIS — C641 Malignant neoplasm of right kidney, except renal pelvis: Secondary | ICD-10-CM

## 2023-08-23 DIAGNOSIS — K449 Diaphragmatic hernia without obstruction or gangrene: Secondary | ICD-10-CM | POA: Diagnosis not present

## 2023-08-23 DIAGNOSIS — E041 Nontoxic single thyroid nodule: Secondary | ICD-10-CM | POA: Diagnosis not present

## 2023-08-23 DIAGNOSIS — N281 Cyst of kidney, acquired: Secondary | ICD-10-CM | POA: Diagnosis not present

## 2023-08-23 DIAGNOSIS — Z905 Acquired absence of kidney: Secondary | ICD-10-CM | POA: Diagnosis not present

## 2023-08-23 DIAGNOSIS — I2510 Atherosclerotic heart disease of native coronary artery without angina pectoris: Secondary | ICD-10-CM | POA: Diagnosis not present

## 2023-08-24 ENCOUNTER — Telehealth: Payer: Self-pay | Admitting: Pharmacy Technician

## 2023-08-24 ENCOUNTER — Ambulatory Visit (INDEPENDENT_AMBULATORY_CARE_PROVIDER_SITE_OTHER)

## 2023-08-24 DIAGNOSIS — I639 Cerebral infarction, unspecified: Secondary | ICD-10-CM

## 2023-08-24 NOTE — Progress Notes (Signed)
   08/24/2023  Patient ID: Brooke [name], female   DOB: 02-01-1956, 68 y.o.   MRN: [id number]  Patient engaged with clinical pharmacist for management of diabetes on 07/19/2023. Outreach by [employer] technician was requested.   Outreached patient to discuss diabetes medication management. Left voicemail for patient to return my call at their convenience.    Kaesen Rodriguez, CPhT [facility] Office: [phone] Email: [email or web address]

## 2023-08-25 LAB — CUP PACEART REMOTE DEVICE CHECK
Date Time Interrogation Session: 20250619112707
Implantable Pulse Generator Implant Date: 20250519

## 2023-08-27 ENCOUNTER — Ambulatory Visit: Payer: Self-pay | Admitting: Internal Medicine

## 2023-08-28 ENCOUNTER — Telehealth: Payer: Self-pay | Admitting: Pharmacy Technician

## 2023-08-28 NOTE — Progress Notes (Signed)
   08/28/2023 Name: Brooke [name] MRN: [id number] DOB: 1955/07/11  Patient is appearing on a report for [employer] [facility] Diabetes and last engaged with the clinical pharmacist to discuss diabetes on 07/19/2023. Contacted patient today to discuss diabetes management and completed medication review.   Diabetes Plan from last clinical pharmacist appointment: Diabetes: - Currently controlled - Reviewed long term cardiovascular and renal outcomes of uncontrolled blood sugar - Reviewed goal A1c, goal fasting, and goal 2 hour post prandial glucose - Recommend to :  DECREASE BASAL TO Basaglar  30-32 units daily  DECREASE Humalog  5-10 units with each meal  Increase Ozempic  to 0.5mg  weekly (shipment arrived)  Received [employer] in [employer] - Patient denies personal or family history of multiple endocrine neoplasia type 2, medullary thyroid  cancer; personal history of pancreatitis or gallbladder disease. - Recommend to check glucose using Libre CGM   Medication Adherence Barriers Identified:  Patient made recommended medication changes per plan: Yes Patient informs she has been taking Humalog  10 units with each meal and uses between 32-34 units of Basalgar daily. She informs she received Ozempic  1mg  from the PAP but her dose is Ozempic  0.5mg . She informs she spoke to a pharmacist named Brooke [name] who informed her to use 37 clicks to get the 0.5mg  dose. She informs for the first few days after injecting the Ozempic  she is Sick. She informs she is nauseous but it eventually subsides. She denies any vomiting or diarrhea. She informs she eats small meals and will eat a little something even if she feels full since she is a diabetic. Access issues with any new medication or testing device: No Patient informs she receives the above name medications for free. Ozempic  is thru PAP.  Patient is checking blood sugars as prescribed: Yes Patient informs in the mornings blood sugar is usually between 113-120. She  informs she was having 2-3 days per week when her blood sugar was low but since decreasing the Basaglar  dose she informs those instances have gone down. She informs the other morning it was 69 and she ate a little something and it came back within range. In the evenings, the blood sugar ranges between 133-140 but at times it can be over 200 if she eats watermelon or something sweet.   Medication Adherence Barriers Addressed/Actions Taken:  Reviewed medication changes per plan from last clinical pharmacist note Reviewed instructions for monitoring blood sugars at home and reminded patient to keep a written log to review with pharmacist Reminded patient of date/time of upcoming clinical pharmacist follow up and any upcoming PCP/specialists visits. Patient denies transportation barriers to the appointment. Yes  Next clinical pharmacist appointment is scheduled for: TBD, will send inbasket message to care guide Brooke [name], RMA and Brooke [name], PharmD to determing if and when another appontment is needed with pharmacist.  Brooke [name], CPhT [facility] Office: [phone] Email: [name]

## 2023-08-29 ENCOUNTER — Telehealth: Payer: Self-pay

## 2023-08-29 ENCOUNTER — Telehealth: Payer: Self-pay | Admitting: Pharmacist

## 2023-08-29 DIAGNOSIS — Z794 Long term (current) use of insulin: Secondary | ICD-10-CM

## 2023-08-29 NOTE — Telephone Encounter (Signed)
   New referral needed for pharmacy.  Patient to stay engaged with pharmacy for T2DM/CKD management

## 2023-08-29 NOTE — Progress Notes (Signed)
 Complex Care Management Care Guide Note  08/29/2023 Name: Brooke [name] MRN: [id number] DOB: 1955-06-21  [name] is a 68 y.o. year old female who is a primary care patient of Cathlene, Marry Lenis, FNP and is actively engaged with the care management team. I reached out to Hezzie Sitter by phone today to assist with scheduling  with the Pharmacist.  Follow up plan: Unsuccessful telephone outreach attempt made. A HIPAA compliant phone message was left for the patient providing contact information and requesting a return call.  Jeoffrey Buffalo , RMA     [facility]  [facility], [facility] Guide  Direct Dial : [phone]  Website: [email or web address]

## 2023-09-01 NOTE — Progress Notes (Signed)
 Pharmacy Medication Assistance Program Note    09/01/2023  Patient ID: Brooke [name], female   DOB: Nov 06, 1955, 68 y.o.   MRN: [id number]     04/14/2023  Outreach Medication One  Manufacturer Medication One Nurse, adult Drugs Bretztri  Type of Radiographer, therapeutic Assistance  Date Application Sent to Prescriber 04/14/2023  Date Application Received From Provider 04/20/2023  Date Application Submitted to Manufacturer 04/24/2023  Patient Assistance Determination Approved  Approval End Date 03/06/2024     NEW - APPROVED

## 2023-09-04 NOTE — Progress Notes (Signed)
 Care Guide Pharmacy Note  09/04/2023 Name: Brooke [name] MRN: [id number] DOB: 1955/12/03  Referred By: Cathlene Marry Lenis, FNP Reason for referral: Complex Care Management (Outreach to schedule f/u with Pharm d )   Brooke [name] is a 68 y.o. year old female who is a primary care patient of Cathlene, Marry Lenis, FNP.  Brooke [name] was referred to the pharmacist for assistance related to: HTN and DMII  Successful contact was made with the patient to discuss pharmacy services including being ready for the pharmacist to call at least 5 minutes before the scheduled appointment time and to have medication bottles and any blood pressure readings ready for review. The patient agreed to meet with the pharmacist via telephone visit on (date/time).10/25/2023  Jeoffrey Buffalo , RMA     [place]  [facility] Guide  Direct Dial : [phone]  Website: [email or web address]

## 2023-09-07 ENCOUNTER — Other Ambulatory Visit: Payer: Self-pay

## 2023-09-07 ENCOUNTER — Emergency Department (HOSPITAL_BASED_OUTPATIENT_CLINIC_OR_DEPARTMENT_OTHER)

## 2023-09-07 DIAGNOSIS — I10 Essential (primary) hypertension: Secondary | ICD-10-CM | POA: Insufficient documentation

## 2023-09-07 DIAGNOSIS — Z7982 Long term (current) use of aspirin: Secondary | ICD-10-CM | POA: Diagnosis not present

## 2023-09-07 DIAGNOSIS — Z79899 Other long term (current) drug therapy: Secondary | ICD-10-CM | POA: Insufficient documentation

## 2023-09-07 DIAGNOSIS — E1121 Type 2 diabetes mellitus with diabetic nephropathy: Secondary | ICD-10-CM | POA: Insufficient documentation

## 2023-09-07 DIAGNOSIS — Z85528 Personal history of other malignant neoplasm of kidney: Secondary | ICD-10-CM | POA: Diagnosis not present

## 2023-09-07 DIAGNOSIS — H538 Other visual disturbances: Secondary | ICD-10-CM | POA: Insufficient documentation

## 2023-09-07 DIAGNOSIS — Z794 Long term (current) use of insulin: Secondary | ICD-10-CM | POA: Diagnosis not present

## 2023-09-07 DIAGNOSIS — H53121 Transient visual loss, right eye: Secondary | ICD-10-CM | POA: Diagnosis not present

## 2023-09-07 DIAGNOSIS — J0100 Acute maxillary sinusitis, unspecified: Secondary | ICD-10-CM | POA: Diagnosis not present

## 2023-09-07 DIAGNOSIS — R0981 Nasal congestion: Secondary | ICD-10-CM | POA: Diagnosis present

## 2023-09-07 LAB — CBC WITH DIFFERENTIAL/PLATELET
Abs Immature Granulocytes: 0.03 10*3/uL (ref 0.00–0.07)
Basophils Absolute: 0.1 10*3/uL (ref 0.0–0.1)
Basophils Relative: 1 %
Eosinophils Absolute: 1.1 10*3/uL — ABNORMAL HIGH (ref 0.0–0.5)
Eosinophils Relative: 11 %
HCT: 37.6 % (ref 36.0–46.0)
Hemoglobin: 11.5 g/dL — ABNORMAL LOW (ref 12.0–15.0)
Immature Granulocytes: 0 %
Lymphocytes Relative: 31 %
Lymphs Abs: 3 10*3/uL (ref 0.7–4.0)
MCH: 26.1 pg (ref 26.0–34.0)
MCHC: 30.6 g/dL (ref 30.0–36.0)
MCV: 85.5 fL (ref 80.0–100.0)
Monocytes Absolute: 0.6 10*3/uL (ref 0.1–1.0)
Monocytes Relative: 7 %
Neutro Abs: 4.8 10*3/uL (ref 1.7–7.7)
Neutrophils Relative %: 50 %
Platelets: 403 10*3/uL — ABNORMAL HIGH (ref 150–400)
RBC: 4.4 MIL/uL (ref 3.87–5.11)
RDW: 14.6 % (ref 11.5–15.5)
WBC: 9.6 10*3/uL (ref 4.0–10.5)
nRBC: 0 % (ref 0.0–0.2)

## 2023-09-07 LAB — BASIC METABOLIC PANEL WITH GFR
Anion gap: 12 (ref 5–15)
BUN: 30 mg/dL — ABNORMAL HIGH (ref 8–23)
CO2: 22 mmol/L (ref 22–32)
Calcium: 9.5 mg/dL (ref 8.9–10.3)
Chloride: 109 mmol/L (ref 98–111)
Creatinine, Ser: 2.54 mg/dL — ABNORMAL HIGH (ref 0.44–1.00)
GFR, Estimated: 20 mL/min — ABNORMAL LOW (ref >60)
Glucose, Bld: 93 mg/dL (ref 70–99)
Potassium: 4.2 mmol/L (ref 3.5–5.1)
Sodium: 142 mmol/L (ref 135–145)

## 2023-09-07 LAB — CBG MONITORING, ED: Glucose-Capillary: 91 mg/dL (ref 70–99)

## 2023-09-07 LAB — MAGNESIUM: Magnesium: 2.1 mg/dL (ref 1.7–2.4)

## 2023-09-07 NOTE — ED Triage Notes (Signed)
 Pt reports she was driving around 3 pm yesterday when she lost her vision to her right side. Similar s/s with a previous mini stroke in the past. No vision changes at this time. NIH 0

## 2023-09-07 NOTE — ED Notes (Signed)
 Discussed pt with EDP Elnor. Pt NIH remains 0. Pt updated in POC. PIV placed and blood collected. Pts presentation discussed with charge nurse. Pt returning to lobby at this time. Denies any additional needs

## 2023-09-08 ENCOUNTER — Emergency Department (HOSPITAL_BASED_OUTPATIENT_CLINIC_OR_DEPARTMENT_OTHER)
Admission: EM | Admit: 2023-09-08 | Discharge: 2023-09-08 | Disposition: A | Discharge status: Home / Self Care | Attending: Emergency Medicine | Admitting: Emergency Medicine

## 2023-09-08 DIAGNOSIS — J0100 Acute maxillary sinusitis, unspecified: Secondary | ICD-10-CM

## 2023-09-08 DIAGNOSIS — H539 Unspecified visual disturbance: Secondary | ICD-10-CM

## 2023-09-08 MED ORDER — AMOXICILLIN 500 MG PO CAPS: 500.0000 mg | ORAL_CAPSULE | Freq: Two times a day (BID) | ORAL | 0 refills | Status: DC

## 2023-09-08 NOTE — Discharge Instructions (Signed)
   RETURN IMMEDIATELY IF  you develop new shortness of breath, chest pain, fever, have difficulty moving parts of your body (new weakness, numbness, or incoordination), sudden change in speech, vision, swallowing, or understanding, faint or develop new dizziness, severe headache, become poorly responsive or have an altered mental status compared to baseline for you, new rash, abdominal pain, or bloody stools,  Return sooner also if you develop new problems for which you have not talked to your caregiver but you feel may be emergency medical conditions.

## 2023-09-08 NOTE — ED Provider Notes (Signed)
  EMERGENCY DEPARTMENT AT [facility] Provider Note   CSN: [id number] Arrival date & time: 09/07/23  2131     Patient presents with: Eye Problem   Brooke [name] is a 68 y.o. female.   The history is provided by the patient.  Eye Problem Associated symptoms: no headaches, no numbness, no vomiting and no weakness   Patient with history of renal insufficiency, hypertension, previous CVA presents for visual changes. Patient reports over 24 hours ago she was driving when she looked up in the rearview mirror and felt that she lost vision in both eyes for a brief second. If returned immediately and she has had no symptoms since.  No headache, no eye pain.  No current visual loss.  No double vision reported.  No arm or leg weakness.  No chest pain or shortness of breath.  She also reports recent nasal congestion and facial pain.  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 Authorizing Provider  amoxicillin  (AMOXIL ) 500 MG capsule Take 1 capsule (500 mg total) by mouth 2 (two) times daily. 09/08/23  Yes Midge Golas, MD  acetaminophen  (TYLENOL ) 500 MG tablet Take 1,000 mg by mouth every 8 (eight) hours as needed for moderate pain.    [provider]  albuterol  (PROVENTIL ) (2.5 MG/3ML) 0.083% nebulizer solution Take 3 mLs (2.5 mg total) by nebulization every 6 (six) hours as needed for wheezing or shortness of breath.  03/23/23   [name], Marry Lenis, FNP  albuterol  (VENTOLIN  HFA) 108 (90 Base) MCG/ACT inhaler Inhale 2 puffs into the lungs every 6 (six) hours as needed for wheezing or shortness of breath. 05/26/23   [name], Marry Lenis, FNP  amLODipine  (NORVASC ) 10 MG tablet Take 1 tablet (10 mg total) by mouth daily. 07/26/23   [name], Marry Lenis, FNP  Ascorbic Acid (VITA-C PO) Take 1 tablet by mouth 2 (two) times a week.    [provider]  aspirin  EC 81 MG tablet Take 1 tablet (81 mg total) by mouth daily. Swallow whole. 12/16/22   Willette Adriana LABOR, MD  atenolol  (TENORMIN ) 100 MG tablet Take 1 tablet (100 mg total) by mouth every morning. 07/26/23   [name], Marry Lenis, FNP  atorvastatin  (LIPITOR) 80 MG tablet Take 1 tablet (80 mg total) by mouth daily. 07/26/23   [name], Marry Lenis, FNP  Budeson-Glycopyrrol-Formoterol  (BREZTRI  AEROSPHERE) 160-9-4.8 MCG/ACT AERO Inhale 2 puffs into the lungs 2 (two) times daily. 04/25/23   [name], Marry Lenis, FNP  carbamide peroxide (DEBROX) 6.5 % OTIC solution Place 5 drops into both ears 2 (two) times daily. 03/12/23   Francesca Elsie CROME, MD  Cholecalciferol 25 MCG (1000 UT) capsule Take 1,000 Units by mouth every other day. 01/07/21   [provider]  Continuous Glucose Receiver (FREESTYLE LIBRE 3 READER) DEVI 1 Device by Does not apply route as directed. with compatible  Freestyle Libre 3 sensor to monitor glucose continuously. DX:E11.65 09/01/22   [name], Marry Lenis, FNP  Continuous Glucose Sensor (FREESTYLE LIBRE 3 PLUS SENSOR) MISC Change sensor every 15 days. DX: E11.65 06/19/23   MilianMarry Lenis, FNP  hydrALAZINE  (APRESOLINE ) 25 MG tablet Take 1 tablet (25 mg total) by mouth 3 (three) times daily. 07/26/23   [name], Marry Lenis, FNP  Insulin  Glargine (BASAGLAR  KWIKPEN) 100 UNIT/ML Inject 37 Units into the skin daily. Patient taking differently: Inject 32 Units into the skin daily. 05/23/23   [name], Marry Lenis, FNP   insulin  lispro (HUMALOG  KWIKPEN) 100 UNIT/ML KwikPen Inject 10-15 Units into the skin See admin instructions. 10 units WITH BREAKFAST AND LUNCH, TAKE 15 units at [facility] Patient taking differently: Inject 5-10 Units into the skin See admin instructions. 10 units WITH BREAKFAST AND LUNCH, TAKE 15 units at [facility] 05/23/23   [name], Marry Lenis, FNP  Insulin  Pen Needle 31G X 6 MM MISC Use to inject insulin  4 times daily as directed. DX: E11.65 04/03/23   Cathlene Marry Lenis, FNP  Lancet Device MISC Check BG prn 03/29/23   [name], Marry Lenis, FNP  lisinopril  (ZESTRIL ) 40 MG tablet Take 1 tablet (40 mg total) by mouth daily. 05/30/23   [name], Marry Lenis, FNP  montelukast  (SINGULAIR ) 10 MG tablet Take 1 tablet (10 mg total) by mouth at bedtime. 07/26/23   MilianMarry Lenis, FNP  Multiple Vitamin (MULTIVITAMIN ADULT PO) Take 1 tablet by mouth every other day.    [provider]  Nebulizer MISC USE AS DIRECTED 03/23/23   [provider]  Semaglutide ,0.25 or 0.5MG /DOS, (OZEMPIC , 0.25 OR 0.5 MG/DOSE,) 2 MG/3ML SOPN Inject 0.5 mg into the skin once a week. Getting via novo nordisk PAP 06/20/23   [name], Marry Lenis, FNP    Allergies: Jardiance  [empagliflozin ], Kerendia  [finerenone ], and Metformin  and related    Review of Systems  Constitutional:  Negative for fever.  HENT:  Positive for congestion.   Eyes:  Positive for visual disturbance. Negative for pain.  Respiratory:  Negative for shortness of breath.   Cardiovascular:  Negative for chest pain.  Gastrointestinal:  Negative for vomiting.  Neurological:  Negative for speech difficulty, weakness, numbness and headaches.    Updated Vital Signs BP (!) 160/74   Pulse 64   Temp 98.3 F (36.8 C) (Oral)   Resp 16   LMP 03/16/2012   SpO2 97%   Physical Exam CONSTITUTIONAL: Well developed/well nourished HEAD: Normocephalic/atraumatic EYES: EOMI/PERRL, no nystagmus,  no ptosis ENMT: Mucous membranes  moist NECK: supple no meningeal signs CV: S1/S2 noted, no murmurs/rubs/gallops noted LUNGS: Lungs are clear to auscultation bilaterally, no apparent distress ABDOMEN: soft, nontender, no rebound or guarding GU:no cva tenderness NEURO:Awake/alert, face symmetric, no arm or leg drift is noted Equal 5/5 strength with shoulder abduction, elbow flex/extension, wrist flex/extension in upper extremities and equal hand grips bilaterally Equal 5/5 strength with hip flexion,knee flex/extension, foot dorsi/plantar flexion Cranial nerves 3/4/5/6/09/12/08/11/12 tested and intact Gait normal without ataxia No past pointing Sensation to light touch intact in all extremities EXTREMITIES: pulses normal, full ROM SKIN: warm, color normal PSYCH: no abnormalities of mood noted  (all labs ordered are listed, but only abnormal results are displayed) Labs Reviewed  CBC WITH DIFFERENTIAL/PLATELET - Abnormal; Notable for the following components:      Result Value   Hemoglobin 11.5 (*)    Platelets 403 (*)    Eosinophils Absolute 1.1 (*)    All other components within normal limits  BASIC METABOLIC PANEL WITH GFR - Abnormal; Notable for the following components:   BUN 30 (*)    Creatinine, Ser 2.54 (*)    GFR, Estimated 20 (*)    All other components within normal limits  MAGNESIUM  CBG MONITORING, ED    EKG: None  Radiology: CT Head Wo Contrast Result Date: 09/07/2023 CLINICAL DATA:  Transient loss of vision on the right. EXAM: CT HEAD WITHOUT CONTRAST TECHNIQUE: Contiguous axial images were obtained from the base of the skull through the vertex without intravenous contrast. RADIATION DOSE REDUCTION: This exam was performed according to the departmental dose-optimization program which includes automated exposure control, adjustment of the mA and/or kV according to patient size and/or use of iterative reconstruction technique. COMPARISON:  March 12, 2023 FINDINGS: Brain: No evidence of acute infarction,  hemorrhage, hydrocephalus, extra-axial collection or mass lesion/mass effect. There are areas of decreased attenuation within the white matter tracts of the supratentorial brain, consistent with microvascular disease changes. Small chronic right frontal lobe and left occipital lobe infarcts are seen. Vascular: Bilateral marked severity cavernous carotid artery calcification is noted. Skull: Normal. Negative for fracture or focal lesion. Sinuses/Orbits: There is moderate to marked severity bilateral maxillary sinus, sphenoid sinus, right ethmoid sinus and left-sided frontal sinus mucosal thickening. Other: None. IMPRESSION: 1. No acute intracranial abnormality. 2. Small chronic right frontal lobe and left occipital lobe infarcts. 3. Moderate to marked severity bilateral maxillary sinus, sphenoid sinus, right ethmoid sinus and left-sided frontal sinus disease. Electronically Signed   By: Suzen Dials M.D.   On: 09/07/2023 22:39     Procedures   Medications Ordered in the ED - No data to display                                  Medical Decision Making Risk Prescription drug management.   This patient presents to the ED for concern of visual changes, this involves an extensive number of treatment options, and is a complaint that carries with it a high risk of complications and morbidity.  The differential diagnosis includes but is not limited to CVA, intracranial hemorrhage, retinal detachment, vitreous hemorrhage, glaucoma, CRAO, CRVO    Comorbidities that complicate the patient evaluation: Patient's presentation is complicated by their history of hypertension and CVA  Lab Tests: I Ordered, and personally interpreted labs.  The pertinent results include:  renal insufficiency  Imaging Studies ordered: I ordered imaging studies including CT scan head  I independently visualized and interpreted imaging which showed no acute intracranial findings, sinusitis noted I agree with the  radiologist interpretation   Test Considered: I considered further workup including MRI brain but patient is back to baseline will defer   Reevaluation: After the interventions noted above, I reevaluated the patient and found that they have :stayed the same  Complexity of problems addressed: Patient's presentation is most consistent with  acute presentation with potential threat to life or bodily function  Disposition: After consideration of the diagnostic results and the patient's response to treatment,  I feel that the patent would benefit from discharge  .   Patient presents for brief vision changes that occurred well over 24 hours prior to evaluation She reports it was bilateral.  She has had no symptoms since.  She has no signs of stroke.  Initial workup unrevealing except for sinusitis and she has had symptoms.  Will start her on antibiotics that are renally dosed  At this point I do not feel she needs to be admitted for CVA/TIA workup  Patient safe for discharge     Final diagnoses:  Visual changes  Acute non-recurrent maxillary sinusitis    ED Discharge Orders          Ordered    amoxicillin  (AMOXIL ) 500 MG capsule  2 times daily        09/08/23 0245               Midge Golas, MD 09/08/23 [phone]

## 2023-09-18 DIAGNOSIS — N189 Chronic kidney disease, unspecified: Secondary | ICD-10-CM | POA: Diagnosis not present

## 2023-09-18 DIAGNOSIS — D631 Anemia in chronic kidney disease: Secondary | ICD-10-CM | POA: Diagnosis not present

## 2023-09-18 DIAGNOSIS — I10 Essential (primary) hypertension: Secondary | ICD-10-CM | POA: Diagnosis not present

## 2023-09-18 DIAGNOSIS — R809 Proteinuria, unspecified: Secondary | ICD-10-CM | POA: Diagnosis not present

## 2023-09-20 ENCOUNTER — Telehealth: Payer: Self-pay | Admitting: Pharmacy Technician

## 2023-09-20 NOTE — Progress Notes (Signed)
   09/20/2023  Patient ID: Brooke [name], female   DOB: October 01, 1955, 68 y.o.   MRN: [id number]  Incoming voicemail received from patient. Patient requested the following information be sent to PharmD in her voicemail. She informs she is doing pretty good and blood sugars have been looking good. She also informs :blood pressure is not where I want ti to be but not where it has been.. She informs she is doing pretty good , everything going well  She also informs she had had no more side effects from Ozempic . She informs no need to return the call and and she will talk to u at her appointment in August.  Kate Caddy, CPhT [facility] Office: [phone] Email: [email or web address]

## 2023-09-21 ENCOUNTER — Telehealth: Payer: Self-pay | Admitting: Pharmacist

## 2023-09-21 NOTE — Telephone Encounter (Signed)
    Unsuccessful outreach to patient.  VM left encouraging return call or my chart message.  Will send my chart message.  Please let me know if additional medication management is needed prior to 10/25/23.   Jaivion Kingsley Dattero Amyrah Pinkhasov, PharmD, BCACP, CPP Clinical Pharmacist, [facility]

## 2023-09-25 ENCOUNTER — Ambulatory Visit

## 2023-09-25 DIAGNOSIS — I639 Cerebral infarction, unspecified: Secondary | ICD-10-CM

## 2023-09-26 DIAGNOSIS — I129 Hypertensive chronic kidney disease with stage 1 through stage 4 chronic kidney disease, or unspecified chronic kidney disease: Secondary | ICD-10-CM | POA: Diagnosis not present

## 2023-09-26 DIAGNOSIS — E1129 Type 2 diabetes mellitus with other diabetic kidney complication: Secondary | ICD-10-CM | POA: Diagnosis not present

## 2023-09-26 DIAGNOSIS — N184 Chronic kidney disease, stage 4 (severe): Secondary | ICD-10-CM | POA: Diagnosis not present

## 2023-09-26 DIAGNOSIS — R809 Proteinuria, unspecified: Secondary | ICD-10-CM | POA: Diagnosis not present

## 2023-09-26 LAB — CUP PACEART REMOTE DEVICE CHECK
Date Time Interrogation Session: 20250720235700
Implantable Pulse Generator Implant Date: 20250519

## 2023-09-27 ENCOUNTER — Ambulatory Visit: Payer: Self-pay | Admitting: Internal Medicine

## 2023-10-02 ENCOUNTER — Other Ambulatory Visit: Payer: Self-pay | Admitting: *Deleted

## 2023-10-02 DIAGNOSIS — C641 Malignant neoplasm of right kidney, except renal pelvis: Secondary | ICD-10-CM | POA: Diagnosis not present

## 2023-10-02 DIAGNOSIS — E1165 Type 2 diabetes mellitus with hyperglycemia: Secondary | ICD-10-CM

## 2023-10-02 DIAGNOSIS — N281 Cyst of kidney, acquired: Secondary | ICD-10-CM | POA: Diagnosis not present

## 2023-10-02 DIAGNOSIS — Z905 Acquired absence of kidney: Secondary | ICD-10-CM | POA: Diagnosis not present

## 2023-10-09 ENCOUNTER — Other Ambulatory Visit: Payer: Self-pay | Admitting: *Deleted

## 2023-10-09 ENCOUNTER — Encounter: Payer: Self-pay | Admitting: *Deleted

## 2023-10-09 DIAGNOSIS — E1165 Type 2 diabetes mellitus with hyperglycemia: Secondary | ICD-10-CM

## 2023-10-09 NOTE — Progress Notes (Unsigned)
 CALLED PATIENT, NO ANSWER, LEFT MESSAGE ADVISING THAT OZEMPIC  IS AVAILABLE FOR PICK UP

## 2023-10-11 ENCOUNTER — Other Ambulatory Visit: Payer: Self-pay | Admitting: *Deleted

## 2023-10-11 DIAGNOSIS — Z794 Long term (current) use of insulin: Secondary | ICD-10-CM

## 2023-10-11 MED ORDER — FREESTYLE LIBRE 3 READER DEVI: 1.0000 | 0 refills | Status: DC

## 2023-10-11 NOTE — Telephone Encounter (Signed)
 Pt had No PCP listed, was a Marry pt, has future appt on 11/02/23 w/ Rosaline Bruns, FNP. RF sent

## 2023-10-18 DIAGNOSIS — H25813 Combined forms of age-related cataract, bilateral: Secondary | ICD-10-CM | POA: Diagnosis not present

## 2023-10-18 DIAGNOSIS — I639 Cerebral infarction, unspecified: Secondary | ICD-10-CM | POA: Diagnosis not present

## 2023-10-18 DIAGNOSIS — H40023 Open angle with borderline findings, high risk, bilateral: Secondary | ICD-10-CM | POA: Diagnosis not present

## 2023-10-25 ENCOUNTER — Other Ambulatory Visit

## 2023-10-25 NOTE — Addendum Note (Signed)
 Addended by: VICCI SELLER A on: 10/25/2023 01:41 PM   Modules accepted: Orders

## 2023-10-25 NOTE — Progress Notes (Signed)
 [facility] Summary Report / Loop Recorder

## 2023-10-26 ENCOUNTER — Ambulatory Visit (INDEPENDENT_AMBULATORY_CARE_PROVIDER_SITE_OTHER)

## 2023-10-26 DIAGNOSIS — I639 Cerebral infarction, unspecified: Secondary | ICD-10-CM | POA: Diagnosis not present

## 2023-10-26 LAB — CUP PACEART REMOTE DEVICE CHECK
Date Time Interrogation Session: 20250821003704
Implantable Pulse Generator Implant Date: 20250519

## 2023-10-29 ENCOUNTER — Ambulatory Visit: Payer: Self-pay | Admitting: Internal Medicine

## 2023-11-02 ENCOUNTER — Ambulatory Visit: Payer: Self-pay | Admitting: Family Medicine

## 2023-11-02 ENCOUNTER — Encounter: Payer: Self-pay | Admitting: Family Medicine

## 2023-11-02 ENCOUNTER — Ambulatory Visit (INDEPENDENT_AMBULATORY_CARE_PROVIDER_SITE_OTHER): Admitting: Family Medicine

## 2023-11-02 ENCOUNTER — Telehealth: Payer: Self-pay | Admitting: Family Medicine

## 2023-11-02 VITALS — BP 137/71 | HR 70 | Temp 97.9°F | Ht 65.0 in | Wt 216.0 lb

## 2023-11-02 DIAGNOSIS — I152 Hypertension secondary to endocrine disorders: Secondary | ICD-10-CM | POA: Diagnosis not present

## 2023-11-02 DIAGNOSIS — Z8673 Personal history of transient ischemic attack (TIA), and cerebral infarction without residual deficits: Secondary | ICD-10-CM | POA: Diagnosis not present

## 2023-11-02 DIAGNOSIS — H6593 Unspecified nonsuppurative otitis media, bilateral: Secondary | ICD-10-CM

## 2023-11-02 DIAGNOSIS — J301 Allergic rhinitis due to pollen: Secondary | ICD-10-CM | POA: Insufficient documentation

## 2023-11-02 DIAGNOSIS — E1165 Type 2 diabetes mellitus with hyperglycemia: Secondary | ICD-10-CM

## 2023-11-02 DIAGNOSIS — Z794 Long term (current) use of insulin: Secondary | ICD-10-CM | POA: Diagnosis not present

## 2023-11-02 DIAGNOSIS — J410 Simple chronic bronchitis: Secondary | ICD-10-CM | POA: Diagnosis not present

## 2023-11-02 DIAGNOSIS — N184 Chronic kidney disease, stage 4 (severe): Secondary | ICD-10-CM | POA: Diagnosis not present

## 2023-11-02 DIAGNOSIS — E1159 Type 2 diabetes mellitus with other circulatory complications: Secondary | ICD-10-CM | POA: Diagnosis not present

## 2023-11-02 DIAGNOSIS — E1169 Type 2 diabetes mellitus with other specified complication: Secondary | ICD-10-CM | POA: Diagnosis not present

## 2023-11-02 DIAGNOSIS — E785 Hyperlipidemia, unspecified: Secondary | ICD-10-CM

## 2023-11-02 DIAGNOSIS — E559 Vitamin D deficiency, unspecified: Secondary | ICD-10-CM | POA: Diagnosis not present

## 2023-11-02 LAB — BAYER DCA HB A1C WAIVED: HB A1C (BAYER DCA - WAIVED): 7.1 % — ABNORMAL HIGH (ref 4.8–5.6)

## 2023-11-02 MED ORDER — LEVOCETIRIZINE DIHYDROCHLORIDE 5 MG PO TABS: 5.0000 mg | ORAL_TABLET | Freq: Every evening | ORAL | 1 refills | Status: AC

## 2023-11-02 MED ORDER — BREZTRI AEROSPHERE 160-9-4.8 MCG/ACT IN AERO: 2.0000 | INHALATION_SPRAY | Freq: Two times a day (BID) | RESPIRATORY_TRACT | 5 refills | Status: DC

## 2023-11-02 MED ORDER — AMLODIPINE BESYLATE 10 MG PO TABS
10.0000 mg | ORAL_TABLET | Freq: Every day | ORAL | 1 refills | Status: AC
Start: 2023-11-02 — End: ?

## 2023-11-02 MED ORDER — ATENOLOL 100 MG PO TABS
100.0000 mg | ORAL_TABLET | Freq: Every morning | ORAL | 1 refills | Status: AC
Start: 2023-11-02 — End: ?

## 2023-11-02 MED ORDER — SEMAGLUTIDE (1 MG/DOSE) 4 MG/3ML ~~LOC~~ SOPN: 1.0000 mg | PEN_INJECTOR | SUBCUTANEOUS | 3 refills | Status: DC

## 2023-11-02 MED ORDER — ATORVASTATIN CALCIUM 80 MG PO TABS
80.0000 mg | ORAL_TABLET | Freq: Every day | ORAL | 1 refills | Status: AC
Start: 2023-11-02 — End: ?

## 2023-11-02 NOTE — Telephone Encounter (Signed)
 Pt had an appt w/Julie on 10-25-2023 & she missed appt. Please call pt to r/s appt w/Julie ASAP per Rakes.

## 2023-11-02 NOTE — Progress Notes (Signed)
 Subjective:  Patient ID: Brooke [name], female    DOB: [birth year], 68 y.o.   MRN: [id number]  Patient Care Team: Severa Rock HERO, FNP as PCP - General (Family Medicine) Billee Mliss BIRCH, [facility] (Pharmacist) Vicci Mcardle, OD (Optometry) Octavia, Charlie Hamilton, MD as Consulting Physician (Ophthalmology) Rachele Gaynell RAMAN, MD as Referring Physician (Nephrology)   Chief Complaint:  Establish Care (Previous Marry patient ), Medical Management of Chronic Issues, and Ear Pain (Patient states she has been having on and off ear pain that has been going on a few months. )   HPI: Brooke [name] is a 68 y.o. female presenting on 11/02/2023 for Establish Care (Previous Marry patient ), Medical Management of Chronic Issues, and Ear Pain (Patient states she has been having on and off ear pain that has been going on a few months. )   Brooke [name] is a 68 year old female with diabetes, hypertension, and hyperlipidemia who presents for a follow-up visit.  She manages her diabetes with insulin  and Ozempic . Her blood sugar levels are generally well-controlled, though she experiences occasional spikes after meals and lows, such as a recent episode at 6 AM when her blood sugar dropped to 60 mg/dL, requiring a snack. She administers 10 units of Humalog  and 37 units of Basaglar . She has been on insulin  for a long time but cannot recall the exact duration. She is not taking any oral diabetes medications like Farxiga . She is currently on 0.5 mg of Ozempic , administered as 37 clicks on her pen, and has experienced no significant side effects from her diabetes medications, except for occasional low blood sugar episodes in the morning.  For hypertension, she takes lisinopril , atenolol  and amlodipine . She experiences minimal swelling, primarily when sitting for extended periods. She also takes hydralazine  three times a day but often misses the afternoon dose.  She is on atorvastatin  for cholesterol  management and reports shoulder aches, which she attributes to bursitis rather than the medication. She has not tried CoQ10 for muscle aches.  She reports chronic chest congestion and was previously prescribed montelukast , which she discontinued due to concerns about hallucinations. She has not tried other allergy medications like Claritin or Zyrtec. She uses Breztri  and albuterol  inhalers, rinsing her mouth after using Breztri  to prevent thrush.  She mentions a history of a mini-stroke in October of the previous year, which affected her eye. She was seen recently in the ED for dizziness. A CT scan at the time showed no stroke but revealed a sinus infection, for which she was treated with amoxicillin . She continues to experience dizziness and fullness in her ears.          Relevant past medical, surgical, family, and social history reviewed and updated as indicated.  Allergies and medications reviewed and updated. Data reviewed: Chart in Epic.   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   Hypertension    Hyperuricemia 11/27/2020   Obesity (BMI 35.0-39.9 without comorbidity)    Renal mass 08/04/2021   Vertigo    Vitamin D  insufficiency 11/27/2020    Past Surgical History:  Procedure Laterality Date   BREAST SURGERY Right 1974  OPERATIVE ULTRASOUND N/A 08/04/2021   Procedure: OPERATIVE ULTRASOUND;  Surgeon: Alvaro Hummer, MD;  Location: [facility];  Service: Urology;  Laterality: N/A;   ROBOTIC ASSITED PARTIAL NEPHRECTOMY Right 08/04/2021   Procedure: XI ROBOTIC ASSITED RADICAL NEPHRECTOMY;  Surgeon: Alvaro Hummer, MD;  Location: [facility];  Service: Urology;  Laterality: Right;    Social History    Socioeconomic History   Marital status: Married    Spouse name: Donnie   Number of children: 1   Years of education: Not on file   Highest education level: Not on file  Occupational History   Not on file  Tobacco Use   Smoking status: Never   Smokeless tobacco: Never  Vaping Use   Vaping status: Never Used  Substance and Sexual Activity   Alcohol use: No   Drug use: No   Sexual activity: Never  Other Topics Concern   Not on file  Social History Narrative   1 daughter    1 grandson      Lives with husband    Retired    Chief Executive Officer Drivers of Corporate investment banker 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  (04/28/2023)   Overall Financial Resource Strain (CARDIA)    Difficulty of Paying Living Expenses: Not hard at all  Food Insecurity: No Food Insecurity (04/28/2023)   Hunger Vital Sign    Worried About Running Out of Food in the Last Year: Never true    Ran Out of Food in the Last Year: Never true  Transportation Needs: No Transportation Needs (04/28/2023)   PRAPARE - Administrator, Civil Service (Medical): No    Lack of Transportation (Non-Medical): No  Physical Activity: Inactive (04/28/2023)   Exercise Vital Sign    Days of Exercise per Week: 0 days    Minutes of Exercise per Session: 0 min  Stress: No Stress Concern Present (04/28/2023)   [employer] of Occupational Health - Occupational Stress Questionnaire    Feeling of Stress : Not at all  Social Connections: Moderately Integrated (04/28/2023)   Social Connection and Isolation Panel    Frequency of Communication with Friends and Family: More than three times a week    Frequency of Social Gatherings with Friends and Family: Three times a week    Attends Religious Services: More than 4 times per year    Active Member of Clubs or Organizations: No    Attends Banker Meetings: Never    Marital Status: Married  Catering manager Violence: Not At Risk (04/28/2023)   Humiliation, Afraid, Rape, and Kick  questionnaire    Fear of Current or Ex-Partner: No    Emotionally Abused: No    Physically Abused: No    Sexually Abused: No    Outpatient Encounter Medications as of 11/02/2023  Medication Sig   acetaminophen  (TYLENOL ) 500 MG tablet Take 1,000 mg by mouth every 8 (eight) hours as needed for moderate pain.   albuterol  (PROVENTIL ) (2.5 MG/3ML) 0.083% nebulizer solution Take 3 mLs (2.5 mg total) by nebulization every 6 (six) hours as needed for wheezing or shortness of breath.   albuterol  (VENTOLIN  HFA) 108 (90 Base) MCG/ACT inhaler Inhale 2 puffs into the lungs every 6 (six) hours as needed for wheezing or shortness of breath.   Ascorbic Acid (VITA-C PO) Take 1 tablet by mouth 2 (two) times a week.   aspirin  EC 81 MG tablet Take 1 tablet (81 mg total) by mouth daily. Swallow whole.   Cholecalciferol 25 MCG (1000 UT) capsule  Take 1,000 Units by mouth every other day.   Continuous Glucose Receiver (FREESTYLE LIBRE 3 READER) DEVI 1 Device by Does not apply route as directed. with compatible Freestyle Libre 3 sensor to monitor glucose continuously. DX:E11.65   Continuous Glucose Sensor (FREESTYLE LIBRE 3 PLUS SENSOR) MISC Change sensor every 15 days. DX: E11.65   hydrALAZINE  (APRESOLINE ) 25 MG tablet Take 1 tablet (25 mg total) by mouth 3 (three) times daily.   Insulin  Glargine (BASAGLAR  KWIKPEN) 100 UNIT/ML Inject 37 Units into the skin daily.   insulin  lispro (HUMALOG  KWIKPEN) 100 UNIT/ML KwikPen Inject 10-15 Units into the skin See admin instructions. 10 units WITH BREAKFAST AND LUNCH, TAKE 15 units at [facility] (Patient taking differently: Inject 10 Units into the skin 3 (three) times daily.)   Insulin  Pen Needle 31G X 6 MM MISC Use to inject insulin  4 times daily as directed. DX: E11.65   Lancet Device MISC Check BG prn   levocetirizine (XYZAL ) 5 MG tablet Take 1 tablet (5 mg total) by mouth every evening.   lisinopril  (ZESTRIL ) 40 MG tablet Take 1 tablet (40 mg total) by mouth daily.   Multiple  Vitamin (MULTIVITAMIN ADULT PO) Take 1 tablet by mouth every other day.   Nebulizer MISC USE AS DIRECTED   Semaglutide , 1 MG/DOSE, 4 MG/3ML SOPN Inject 1 mg as directed once a week.   [DISCONTINUED] amLODipine  (NORVASC ) 10 MG tablet Take 1 tablet (10 mg total) by mouth daily.   [DISCONTINUED] atenolol  (TENORMIN ) 100 MG tablet Take 1 tablet (100 mg total) by mouth every morning.   [DISCONTINUED] atorvastatin  (LIPITOR) 80 MG tablet Take 1 tablet (80 mg total) by mouth daily.   [DISCONTINUED] Budeson-Glycopyrrol-Formoterol  (BREZTRI  AEROSPHERE) 160-9-4.8 MCG/ACT AERO Inhale 2 puffs into the lungs 2 (two) times daily.   [DISCONTINUED] montelukast  (SINGULAIR ) 10 MG tablet Take 1 tablet (10 mg total) by mouth at bedtime.   [DISCONTINUED] Semaglutide ,0.25 or 0.5MG /DOS, (OZEMPIC , 0.25 OR 0.5 MG/DOSE,) 2 MG/3ML SOPN Inject 0.5 mg into the skin once a week. Getting via novo nordisk PAP   amLODipine  (NORVASC ) 10 MG tablet Take 1 tablet (10 mg total) by mouth daily.   atenolol  (TENORMIN ) 100 MG tablet Take 1 tablet (100 mg total) by mouth every morning.   atorvastatin  (LIPITOR) 80 MG tablet Take 1 tablet (80 mg total) by mouth daily.   budesonide -glycopyrrolate-formoterol  (BREZTRI  AEROSPHERE) 160-9-4.8 MCG/ACT AERO inhaler Inhale 2 puffs into the lungs 2 (two) times daily.   [DISCONTINUED] carbamide peroxide (DEBROX) 6.5 % OTIC solution Place 5 drops into both ears 2 (two) times daily.   [DISCONTINUED] Continuous Glucose Receiver (FREESTYLE LIBRE 3 READER) DEVI 1 Device by Does not apply route as directed. with compatible Freestyle Libre 3 sensor to monitor glucose continuously. DX:E11.65   No facility-administered encounter medications on file as of 11/02/2023.    Allergies  Allergen Reactions   Jardiance  [Empagliflozin ]     Holding per nephro, pt reports no allergy, Dr just didn't want pt taking it   Kerendia  [Finerenone ]     Holding per nephro, pt reports no allergy, Dr just didn't want pt taking it    Metformin  And Related     Contraindicated due to GFR; GI side effects Pt states not a real allergy    Pertinent ROS per HPI, otherwise unremarkable      Objective:  BP 137/71   Pulse 70   Temp 97.9 F (36.6 C)   Ht 5' 5 (1.651 m)   Wt 216 lb (98 kg)   LMP  03/16/2012   SpO2 96%   BMI 35.94 kg/m    Wt Readings from Last 3 Encounters:  11/02/23 216 lb (98 kg)  07/26/23 220 lb (99.8 kg)  07/24/23 218 lb 12.8 oz (99.2 kg)    Physical Exam Vitals and nursing note reviewed.  Constitutional:      General: She is not in acute distress.    Appearance: Normal appearance. She is well-developed and well-groomed. She is obese. She is not ill-appearing, toxic-appearing or diaphoretic.  HENT:     Head: Normocephalic and atraumatic.     Jaw: There is normal jaw occlusion.     Right Ear: Hearing normal. A middle ear effusion is present. Tympanic membrane is not erythematous.     Left Ear: Hearing normal. A middle ear effusion is present. Tympanic membrane is not erythematous.     Nose: Nose normal. No congestion or rhinorrhea.     Right Turbinates: Enlarged.     Left Turbinates: Enlarged.     Right Sinus: No maxillary sinus tenderness or frontal sinus tenderness.     Left Sinus: No maxillary sinus tenderness or frontal sinus tenderness.     Mouth/Throat:     Lips: Pink.     Mouth: Mucous membranes are moist.     Pharynx: Oropharynx is clear. Uvula midline. Postnasal drip present. No posterior oropharyngeal erythema.  Eyes:     General: Lids are normal. Allergic shiner present.     Extraocular Movements: Extraocular movements intact.     Conjunctiva/sclera: Conjunctivae normal.     Pupils: Pupils are equal, round, and reactive to light.  Neck:     Thyroid : No thyroid  mass, thyromegaly or thyroid  tenderness.     Vascular: No carotid bruit or JVD.     Trachea: Trachea and phonation normal.  Cardiovascular:     Rate and Rhythm: Normal rate and regular rhythm.     Chest Wall: PMI  is not displaced.     Pulses: Normal pulses.     Heart sounds: Normal heart sounds. No murmur heard.    No friction rub. No gallop.  Pulmonary:     Effort: Pulmonary effort is normal. No respiratory distress.     Breath sounds: Wheezing (minimal) present.  Abdominal:     General: Bowel sounds are normal. There is no distension or abdominal bruit.     Palpations: Abdomen is soft. There is no hepatomegaly or splenomegaly.     Tenderness: There is no abdominal tenderness. There is no right CVA tenderness or left CVA tenderness.     Hernia: No hernia is present.  Musculoskeletal:        General: Normal range of motion.     Cervical back: Normal range of motion and neck supple.     Right lower leg: No edema.     Left lower leg: No edema.  Lymphadenopathy:     Cervical: No cervical adenopathy.  Skin:    General: Skin is warm and dry.     Capillary Refill: Capillary refill takes less than 2 seconds.     Coloration: Skin is not cyanotic, jaundiced or pale.     Findings: No rash.  Neurological:     General: No focal deficit present.     Mental Status: She is alert and oriented to person, place, and time.     Sensory: Sensation is intact.     Motor: Motor function is intact.     Coordination: Coordination is intact.     Gait: Gait is  intact.     Deep Tendon Reflexes: Reflexes are normal and symmetric.  Psychiatric:        Attention and Perception: Attention and perception normal.        Mood and Affect: Mood and affect normal.        Speech: Speech normal.        Behavior: Behavior normal. Behavior is cooperative.        Thought Content: Thought content normal.        Cognition and Memory: Cognition and memory normal.        Judgment: Judgment normal.     Results for orders placed or performed in visit on 10/26/23  CUP PACEART REMOTE DEVICE CHECK   Collection Time: 10/26/23 12:37 AM  Result Value Ref Range   Date Time Interrogation Session [id number]    Pulse Generator  Manufacturer MERM    Pulse Gen Model LNQ22 LINQ II    Pulse Gen Serial Number [id number]    Clinic Name [facility]    Implantable Pulse Generator Type ICM/ILR    Implantable Pulse Generator Implant Date [id number]        Pertinent labs & imaging results that were available during my care of the patient were reviewed by me and considered in my medical decision making.  Assessment & Plan:  Darcie was seen today for establish care, medical management of chronic issues and ear pain.  Diagnoses and all orders for this visit:  Type 2 diabetes mellitus with hyperglycemia, with long-term current use of insulin  (HCC) -     Bayer DCA Hb A1c Waived -     CMP14+EGFR -     Lipid panel -     atorvastatin  (LIPITOR) 80 MG tablet; Take 1 tablet (80 mg total) by mouth daily. -     Microalbumin / creatinine urine ratio -     Semaglutide , 1 MG/DOSE, 4 MG/3ML SOPN; Inject 1 mg as directed once a week.  Hypertension associated with diabetes (HCC) -     Bayer DCA Hb A1c Waived -     CMP14+EGFR -     Lipid panel -     atorvastatin  (LIPITOR) 80 MG tablet; Take 1 tablet (80 mg total) by mouth daily. -     atenolol  (TENORMIN ) 100 MG tablet; Take 1 tablet (100 mg total) by mouth every morning. -     amLODipine  (NORVASC ) 10 MG tablet; Take 1 tablet (10 mg total) by mouth daily. -     Microalbumin / creatinine urine ratio  CKD (chronic kidney disease) stage 4, GFR 15-29 ml/min (HCC) -     Vitamin D , 25-hydroxy -     CMP14+EGFR -     Microalbumin / creatinine urine ratio  Hyperlipidemia associated with type 2 diabetes mellitus (HCC) -     CMP14+EGFR -     Lipid panel -     atorvastatin  (LIPITOR) 80 MG tablet; Take 1 tablet (80 mg total) by mouth daily.  Simple chronic bronchitis (HCC) -     budesonide -glycopyrrolate-formoterol  (BREZTRI  AEROSPHERE) 160-9-4.8 MCG/ACT AERO inhaler; Inhale 2 puffs into the lungs 2 (two) times daily.  History of CVA (cerebrovascular accident) -     CMP14+EGFR -      Lipid panel -     atorvastatin  (LIPITOR) 80 MG tablet; Take 1 tablet (80 mg total) by mouth daily.  Vitamin D  insufficiency -     Vitamin D , 25-hydroxy -     CMP14+EGFR  Seasonal allergic rhinitis due to pollen -  levocetirizine (XYZAL ) 5 MG tablet; Take 1 tablet (5 mg total) by mouth every evening.  Fluid level behind tympanic membrane of both ears -     levocetirizine (XYZAL ) 5 MG tablet; Take 1 tablet (5 mg total) by mouth every evening.     Type 2 diabetes mellitus with insulin  use Type 2 diabetes mellitus managed with insulin  and Ozempic . Blood glucose levels fluctuate with occasional hypoglycemic episodes, particularly in the morning. Current A1c is 7.1, indicating improvement. The goal is to increase Ozempic  to reduce insulin  dependency. - Increase Ozempic  to 1 mg weekly - Follow up with clinical pharmacist Mliss to adjust Ozempic  to maximum dose and reduce insulin  - Continue current insulin  regimen: Humalog  10 units and Basaglar  37 units  Hypertension Hypertension managed with lisinopril , atenolol , amlodipine , and hydralazine . Blood pressure is well-controlled with current regimen. Occasional missed doses of hydralazine  in the afternoon. - Continue lisinopril , atenolol  and amlodipine  - Take hydralazine  three times daily as prescribed  Hyperlipidemia Hyperlipidemia managed with atorvastatin . No significant muscle aches reported, but she experiences shoulder bursitis. - Continue atorvastatin  - Consider adding CoQ10 if muscle aches develop  Chronic bronchitis Chronic bronchitis managed with Breztri  and albuterol  inhalers. No significant issues reported with inhaler use. She experiences chest congestion and is not taking montelukast  due to fear of side effects. - Continue Breztri  and albuterol  inhalers - Rinse mouth after using Breztri  to prevent thrush - Start Xyzal  nightly for congestion  Eustachian tube dysfunction with middle ear effusion Eustachian tube dysfunction  with residual fluid behind the ears, likely contributing to dizziness. Previous sinus infection treated with amoxicillin . - Start Xyzal  nightly to help with fluid drainage and reduce dizziness        Continue all other maintenance medications.  Follow up plan: Return in 3 months (on 02/02/2024), or if symptoms worsen or fail to improve, for DM.   Continue healthy lifestyle choices, including diet (rich in fruits, vegetables, and lean proteins, and low in salt and simple carbohydrates) and exercise (at least 30 minutes of moderate physical activity daily).  Educational handout given for DM  The above assessment and management plan was discussed with the patient. The patient verbalized understanding of and has agreed to the management plan. Patient is aware to call the clinic if they develop any new symptoms or if symptoms persist or worsen. Patient is aware when to return to the clinic for a follow-up visit. Patient educated on when it is appropriate to go to the emergency department.   Rosaline Bruns, FNP-C [facility] [phone]

## 2023-11-02 NOTE — Patient Instructions (Signed)

## 2023-11-03 ENCOUNTER — Telehealth: Payer: Self-pay

## 2023-11-03 LAB — CMP14+EGFR
ALT: 10 IU/L (ref 0–32)
AST: 12 IU/L (ref 0–40)
Albumin: 3.8 g/dL — ABNORMAL LOW (ref 3.9–4.9)
Alkaline Phosphatase: 108 IU/L (ref 44–121)
BUN/Creatinine Ratio: 9 — ABNORMAL LOW (ref 12–28)
BUN: 21 mg/dL (ref 8–27)
Bilirubin Total: 0.3 mg/dL (ref 0.0–1.2)
CO2: 21 mmol/L (ref 20–29)
Calcium: 8.9 mg/dL (ref 8.7–10.3)
Chloride: 105 mmol/L (ref 96–106)
Creatinine, Ser: 2.23 mg/dL — ABNORMAL HIGH (ref 0.57–1.00)
Globulin, Total: 2.6 g/dL (ref 1.5–4.5)
Glucose: 71 mg/dL (ref 70–99)
Potassium: 4.3 mmol/L (ref 3.5–5.2)
Sodium: 142 mmol/L (ref 134–144)
Total Protein: 6.4 g/dL (ref 6.0–8.5)
eGFR: 23 mL/min/1.73 — ABNORMAL LOW (ref >59)

## 2023-11-03 LAB — LIPID PANEL
Chol/HDL Ratio: 4.6 ratio — ABNORMAL HIGH (ref 0.0–4.4)
Cholesterol, Total: 146 mg/dL (ref 100–199)
HDL: 32 mg/dL — ABNORMAL LOW (ref >39)
LDL Chol Calc (NIH): 78 mg/dL (ref 0–99)
Triglycerides: 216 mg/dL — ABNORMAL HIGH (ref 0–149)
VLDL Cholesterol Cal: 36 mg/dL (ref 5–40)

## 2023-11-03 LAB — VITAMIN D 25 HYDROXY (VIT D DEFICIENCY, FRACTURES): Vit D, 25-Hydroxy: 27.9 ng/mL — ABNORMAL LOW (ref 30.0–100.0)

## 2023-11-03 NOTE — Progress Notes (Signed)
 Complex Care Management Care Guide Note  11/03/2023 Name: Antonela Freiman MRN: [id number] DOB: January 02, 1956  Myanna Ziesmer is a 68 y.o. year old female who is a primary care patient of Rakes, Rock HERO, FNP and is actively engaged with the care management team. I reached out to Francille Jobson by phone today to assist with re-scheduling  with the Pharmacist.  Follow up plan: Unsuccessful telephone outreach attempt made. A HIPAA compliant phone message was left for the patient providing contact information and requesting a return call.  Jeoffrey Buffalo , RMA     [facility]  [facility], [facility]  Direct Dial : [phone]  Website: [email or web address]

## 2023-11-09 ENCOUNTER — Other Ambulatory Visit (INDEPENDENT_AMBULATORY_CARE_PROVIDER_SITE_OTHER)

## 2023-11-09 DIAGNOSIS — Z7985 Long-term (current) use of injectable non-insulin antidiabetic drugs: Secondary | ICD-10-CM

## 2023-11-09 DIAGNOSIS — E119 Type 2 diabetes mellitus without complications: Secondary | ICD-10-CM

## 2023-11-09 DIAGNOSIS — Z794 Long term (current) use of insulin: Secondary | ICD-10-CM

## 2023-11-09 NOTE — Progress Notes (Signed)
 11/09/2023 Name: Brooke [name] MRN: [id number] DOB: Mar 21, 1955  Chief Complaint  Patient presents with   Diabetes    Brooke [name] is a 68 y.o. year old female who presented for a telephone visit.  I connected with  Brooke [name] on 11/09/23 by telephone and verified that I am speaking with the correct person using two identifiers. I discussed the limitations of evaluation and management by telemedicine. The patient expressed understanding and agreed to proceed.  Patient was located in her home and PharmD in PCP office during this visit.   They were referred to the pharmacist by their PCP for assistance in managing diabetes, medication access, and complex medication management.    Subjective:  Patient reports her blood pressure is much more controlled and her nephrologist has made adjustments to her regimen (updated med list).  She is tolerating Ozempic  well and now ready to increase her dose again in hopes to decrease insulin  requirements.   Care Team: Primary Care Provider: Severa Rock HERO, FNP ; Next Scheduled Visit: 07/26/23 Nephro Dr. Rachele 09/26/23  Medication Access/Adherence  Current Pharmacy:  [facility] [address] [place] - [address] [place] [address] Phone: [phone] Fax: [phone]  [facility] - [place], [place] - [address] [place] [address] Phone: [phone] Fax: [phone]  [facility] - [address] [place] [address], [place] - [address] [place] [address] [place] [address] Phone: [phone] Fax: [phone]  Patient reports affordability concerns with their medications: Yes  Patient reports access/transportation concerns to their pharmacy: No  Patient reports adherence concerns with their medications:  No     Diabetes:  Current medications: Basaglar  32 units daily, Humalog  5-10 (decreased due ozempic ), Ozempic  0.5mg  weekly Medications tried in the past:  trulicity , farxiga , metformin , jardiance   Current glucose readings: most FBG<130 Using FSL3 plus CGM Reports some AM lows around 68-69--will decrease basal  Current physical activity: walking  Current medication access support: Novo PAP Ozempic , Lilly PAP--Basaglar , Humalog   Macrovascular and Microvascular Risk Reduction:  Statin? yes (atorva); ACEi/ARB? yes (nephro driving care & UACR) Last urinary albumin/creatinine ratio:  Lab Results  Component Value Date   MICRALBCREAT 33 (H) 05/12/2021   MICRALBCREAT 144 (H) 04/14/2020   MICRALBCREAT 33 (H) 03/13/2019   Last eye exam:  Lab Results  Component Value Date   HMDIABEYEEXA No Retinopathy 01/03/2023   Last foot exam: 06/27/2023 Tobacco Use:  Tobacco Use: Low Risk  (11/02/2023)   Patient History    Smoking Tobacco Use: Never    Smokeless Tobacco Use: Never    Passive Exposure: Not on file     Objective:  Lab Results  Component Value Date   HGBA1C 7.1 (H) 11/02/2023    Lab Results  Component Value Date   CREATININE 2.23 (H) 11/02/2023   BUN 21 11/02/2023   NA 142 11/02/2023   K 4.3 11/02/2023   CL 105 11/02/2023   CO2 21 11/02/2023    Lab Results  Component Value Date   CHOL 146 11/02/2023   HDL 32 (L) 11/02/2023   LDLCALC 78 11/02/2023   TRIG 216 (H) 11/02/2023   CHOLHDL 4.6 (H) 11/02/2023    Medications Reviewed Today     Reviewed by Brooke [name], [facility] (Pharmacist) on 11/09/23 at 1002  Med List Status: <None>   Medication Order Taking? Sig Documenting Provider Last Dose Status Informant  acetaminophen  (TYLENOL ) 500 MG tablet [id number]  Take 1,000 mg by mouth every 8 (eight) hours as needed for moderate pain. [provider]  Active Self  albuterol  (PROVENTIL ) (2.5 MG/3ML) 0.083% nebulizer solution [id number]  Take 3 mLs (2.5 mg total) by nebulization every 6 (six) hours as needed for wheezing or shortness of breath. Brooke Marry Lenis, FNP  Active   albuterol  (VENTOLIN  HFA) 108 (90  Base) MCG/ACT inhaler [id number]  Inhale 2 puffs into the lungs every 6 (six) hours as needed for wheezing or shortness of breath. Brooke Marry Lenis, FNP  Active   amLODipine  (NORVASC ) 10 MG tablet [id number]  Take 1 tablet (10 mg total) by mouth daily. Brooke Rock HERO, FNP  Active   Ascorbic Acid (VITA-C PO) [id number]  Take 1 tablet by mouth 2 (two) times a week. [provider]  Active Self  aspirin  EC 81 MG tablet [id number]  Take 1 tablet (81 mg total) by mouth daily. Swallow whole. Brooke Adriana LABOR, MD  Active   atenolol  (TENORMIN ) 100 MG tablet [id number]  Take 1 tablet (100 mg total) by mouth every morning. Brooke Rock HERO, FNP  Active   atorvastatin  (LIPITOR) 80 MG tablet [id number]  Take 1 tablet (80 mg total) by mouth daily. Brooke Rock HERO, FNP  Active   budesonide -glycopyrrolate-formoterol  (BREZTRI  AEROSPHERE) 160-9-4.8 MCG/ACT AERO inhaler [id number]  Inhale 2 puffs into the lungs 2 (two) times daily. Brooke Rock HERO, FNP  Active   Cholecalciferol 25 MCG (1000 UT) capsule [id number]  Take 1,000 Units by mouth every other day. [provider]  Active Self  Continuous Glucose Receiver (FREESTYLE LIBRE 3 READER) DEVI [id number]  1 Device by Does not apply route as directed. with compatible Freestyle Libre 3 sensor to monitor glucose continuously. DX:E11.65 Brooke Rock HERO, FNP  Active   Continuous Glucose Sensor (FREESTYLE LIBRE 3 PLUS SENSOR) MISC [id number]  Change sensor every 15 days. DX: E11.65 Brooke Marry Lenis, FNP  Active   hydrALAZINE  (APRESOLINE ) 25 MG tablet [id number]  Take 1 tablet (25 mg total) by mouth 3 (three) times daily. Brooke Marry Lenis, FNP  Active   Insulin  Glargine (BASAGLAR  KWIKPEN) 100 UNIT/ML [id number]  Inject 37 Units into the skin daily. Brooke Marry Lenis, FNP  Active            Med Note Brooke [name], Brooke [name]   Thu Jun 01, 2023 12:12 PM) Via [employer] patient assistance program    insulin  lispro (HUMALOG  KWIKPEN) 100 UNIT/ML KwikPen  [id number] Yes Inject 10-15 Units into the skin See admin instructions. 10 units WITH BREAKFAST AND LUNCH, TAKE 15 units at [facility]  Patient taking differently: Inject 5-10 Units into the skin 3 (three) times daily.   Brooke Marry Lenis, FNP  Active            Med Note (Leiah Giannotti [name]   Thu Jun 01, 2023 12:12 PM) Via [employer] patient assistance program    Insulin  Pen Needle 31G X 6 MM MISC [id number]  Use to inject insulin  4 times daily as directed. DX: E11.65 Brooke Marry Lenis, FNP  Active   Lancet Device MISC [id number]  Check BG prn Brooke Marry Lenis, FNP  Active   levocetirizine (XYZAL ) 5 MG tablet [id number]  Take 1 tablet (5 mg total) by mouth every evening. Brooke Rock HERO, FNP  Active   lisinopril  (ZESTRIL ) 40 MG tablet [id number]  Take 1 tablet (40 mg total) by mouth daily. Brooke Marry Lenis, FNP  Active   Multiple Vitamin (MULTIVITAMIN ADULT PO) [id number]  Take 1  tablet by mouth every other day. [provider]  Active Self  Nebulizer MISC [id number]  USE AS DIRECTED [provider]  Active   Semaglutide , 1 MG/DOSE, 4 MG/3ML SOPN [id number]  Inject 1 mg as directed once a week. Brooke Rock HERO, FNP  Active            Med Note Brooke [name], MLISS JONETTA Schaumann Nov 09, 2023  9:56 AM) Via [employer] patient assistance program    Med List Note Brooke [name] MLISS JONETTA, [facility] 06/09/21 1043): trulicity , basaglar , humalog  escribe to [facility] specialty mail order symbicort -escribe to [facility] mail order jardiance -escribe to [facility] mail order            Assessment/Plan:   Diabetes: - Currently controlled, A1c 7.1%; goal A1c <7%. Cardiorenal risk reduction is optimized.. Blood pressure is at goal <130/80. LDL is slightly above goal.  - Discussed side effects of gastrointestinal upset/nausea; eating smaller meals, avoiding high-fat foods, and remaining upright after eating may reduce nausea. Discussed that overeating is a major trigger of nausea with this class of  medications, as often times patients will start to feel full sooner and may need to decrease portion sizes from what they were previously accustomed to.  - Reviewed long term cardiovascular and renal outcomes of uncontrolled blood sugar - Reviewed goal A1c, goal fasting, and goal 2 hour post prandial glucose - Recommend to :  DECREASE BASAL TO Basaglar  30 units daily (patient has not decreased)  May DECREASE Humalog  5 units with each meal  Increase Ozempic  to 1mg  weekly starting(shipment arrived)  Received [employer] in [employer] - Patient denies personal or family history of multiple endocrine neoplasia type 2, medullary thyroid  cancer; personal history of pancreatitis or gallbladder disease. - Recommend to check glucose using Libre CGM   Follow Up Plan: PCP 02/09/24, PharmD 3 weeks   MLISS Tarry Griffin, PharmD, BCACP, CPP Clinical Pharmacist, [facility]

## 2023-11-11 ENCOUNTER — Other Ambulatory Visit: Payer: Self-pay | Admitting: Family Medicine

## 2023-11-11 DIAGNOSIS — Z794 Long term (current) use of insulin: Secondary | ICD-10-CM

## 2023-11-14 DIAGNOSIS — R809 Proteinuria, unspecified: Secondary | ICD-10-CM | POA: Diagnosis not present

## 2023-11-14 DIAGNOSIS — D631 Anemia in chronic kidney disease: Secondary | ICD-10-CM | POA: Diagnosis not present

## 2023-11-14 DIAGNOSIS — E211 Secondary hyperparathyroidism, not elsewhere classified: Secondary | ICD-10-CM | POA: Diagnosis not present

## 2023-11-20 ENCOUNTER — Other Ambulatory Visit: Payer: Self-pay | Admitting: Family Medicine

## 2023-11-20 DIAGNOSIS — E1165 Type 2 diabetes mellitus with hyperglycemia: Secondary | ICD-10-CM

## 2023-11-20 NOTE — Telephone Encounter (Unsigned)
 Copied from CRM #[id number]. Topic: Clinical - Medication Refill >> Nov 20, 2023  8:26 AM Suzette B wrote: Medication: Continuous Glucose Sensor (FREESTYLE LIBRE 3 PLUS SENSOR) MISC  Has the patient contacted their pharmacy? No Pharmacy sent text to request the refill from provider This is the patient's preferred pharmacy:  [facility] - [place], [place] - [address] [place] [address] Phone: [phone] Fax: [phone]   Is this the correct pharmacy for this prescription? Yes If no, delete pharmacy and type the correct one.   Has the prescription been filled recently? Yes  Is the patient out of the medication? Yes  Has the patient been seen for an appointment in the last year OR does the patient have an upcoming appointment? Yes  Can we respond through MyChart? Yes  Agent: Please be advised that Rx refills may take up to 3 business days. We ask that you follow-up with your pharmacy.

## 2023-11-21 MED ORDER — FREESTYLE LIBRE 3 PLUS SENSOR MISC: 3 refills | Status: AC

## 2023-11-23 DIAGNOSIS — R809 Proteinuria, unspecified: Secondary | ICD-10-CM | POA: Diagnosis not present

## 2023-11-23 DIAGNOSIS — I129 Hypertensive chronic kidney disease with stage 1 through stage 4 chronic kidney disease, or unspecified chronic kidney disease: Secondary | ICD-10-CM | POA: Diagnosis not present

## 2023-11-23 DIAGNOSIS — E1129 Type 2 diabetes mellitus with other diabetic kidney complication: Secondary | ICD-10-CM | POA: Diagnosis not present

## 2023-11-23 DIAGNOSIS — N184 Chronic kidney disease, stage 4 (severe): Secondary | ICD-10-CM | POA: Diagnosis not present

## 2023-11-27 ENCOUNTER — Ambulatory Visit (INDEPENDENT_AMBULATORY_CARE_PROVIDER_SITE_OTHER)

## 2023-11-27 DIAGNOSIS — I639 Cerebral infarction, unspecified: Secondary | ICD-10-CM | POA: Diagnosis not present

## 2023-11-27 LAB — CUP PACEART REMOTE DEVICE CHECK
Date Time Interrogation Session: 20250921234252
Implantable Pulse Generator Implant Date: 20250519

## 2023-11-28 NOTE — Progress Notes (Signed)
 Remote Loop Recorder Transmission

## 2023-11-30 ENCOUNTER — Other Ambulatory Visit

## 2023-12-01 ENCOUNTER — Other Ambulatory Visit (INDEPENDENT_AMBULATORY_CARE_PROVIDER_SITE_OTHER)

## 2023-12-01 DIAGNOSIS — Z7985 Long-term (current) use of injectable non-insulin antidiabetic drugs: Secondary | ICD-10-CM

## 2023-12-01 DIAGNOSIS — Z794 Long term (current) use of insulin: Secondary | ICD-10-CM

## 2023-12-01 DIAGNOSIS — Z7984 Long term (current) use of oral hypoglycemic drugs: Secondary | ICD-10-CM

## 2023-12-01 DIAGNOSIS — E119 Type 2 diabetes mellitus without complications: Secondary | ICD-10-CM

## 2023-12-01 NOTE — Progress Notes (Signed)
 12/01/2023 Name: Brooke [name] MRN: [id number] DOB: 1955-03-17  Chief Complaint  Patient presents with   Diabetes    Brooke [name] is a 68 y.o. year old female who presented for a telephone visit.  I connected with  Sofiah Dennington on 12/01/23 by telephone and verified that I am speaking with the correct person using two identifiers. I discussed the limitations of evaluation and management by telemedicine. The patient expressed understanding and agreed to proceed.  Patient was located in her home and PharmD in PCP office during this visit.   They were referred to the pharmacist by their PCP for assistance in managing diabetes, medication access, and complex medication management.    Subjective:  Patient reports her blood pressure has been okay.  She has reported morning hypoglycemia and has not reduced her insulin  doses as we discussed last appointment.  We will also have to transition patient back to trulicity  or have a plan for obtaining a GLP1 via insurance due to cancellation of [employer] PAP.  Care Team: Primary Care Provider: Severa Rock HERO, FNP Nephro Dr. Rachele 12/25  Medication Access/Adherence  Current Pharmacy:  [facility] [address], [place] - [address] [place] [address] [place] [address] [place] [address] Phone: [phone] Fax: [phone]  [facility] - [place], [place] - [address] [place] [address] Phone: [phone] Fax: [phone]  [facility] - [place], [place] - [address] [place] [address] [place] [address] Phone: [phone] Fax: [phone]  Patient reports affordability concerns with their medications: Yes  Patient reports access/transportation concerns to their pharmacy: No  Patient reports adherence concerns with their medications:  No     Diabetes:  Current medications: Basaglar  32 units daily, Humalog  5-10 (decreased due ozempic ), Ozempic  0.5mg   weekly Medications tried in the past: trulicity , farxiga , metformin , jardiance   Current glucose readings: most FBG<130 Using FSL3 plus CGM Reports some AM lows around 68-69--will decrease basal  Current physical activity: walking  Current medication access support: Novo PAP Ozempic , Lilly PAP--Basaglar , Humalog   Macrovascular and Microvascular Risk Reduction:  Statin? yes (atorva); ACEi/ARB? yes (nephro driving care & UACR) Last urinary albumin/creatinine ratio:  Lab Results  Component Value Date   MICRALBCREAT 33 (H) 05/12/2021   MICRALBCREAT 144 (H) 04/14/2020   MICRALBCREAT 33 (H) 03/13/2019   Last eye exam:  Lab Results  Component Value Date   HMDIABEYEEXA No Retinopathy 01/03/2023   Last foot exam: 06/27/2023 Tobacco Use:  Tobacco Use: Low Risk  (11/02/2023)   Patient History    Smoking Tobacco Use: Never    Smokeless Tobacco Use: Never    Passive Exposure: Not on file     Objective:  Lab Results  Component Value Date   HGBA1C 7.1 (H) 11/02/2023    Lab Results  Component Value Date   CREATININE 2.23 (H) 11/02/2023   BUN 21 11/02/2023   NA 142 11/02/2023   K 4.3 11/02/2023   CL 105 11/02/2023   CO2 21 11/02/2023    Lab Results  Component Value Date   CHOL 146 11/02/2023   HDL 32 (L) 11/02/2023   LDLCALC 78 11/02/2023   TRIG 216 (H) 11/02/2023   CHOLHDL 4.6 (H) 11/02/2023    Medications Reviewed Today   Medications were not reviewed in this encounter    Assessment/Plan:   Diabetes: - Currently controlled, A1c 7.1%; goal A1c <7%. Cardiorenal risk reduction is optimized.. Blood pressure is at  goal <130/80. LDL is slightly above goal.  - Discussed side effects of gastrointestinal upset/nausea; eating smaller meals, avoiding high-fat foods, and remaining upright after eating may reduce nausea. Discussed that overeating is a major trigger of nausea with this class of medications, as often times patients will start to feel full sooner and may need to  decrease portion sizes from what they were previously accustomed to.  - Reviewed long term cardiovascular and renal outcomes of uncontrolled blood sugar - Reviewed goal A1c, goal fasting, and goal 2 hour post prandial glucose - Recommend to :  DECREASE BASAL TO Basaglar  30 units daily (patient has not decreased)  May DECREASE Humalog  5 units with each meal  Increase Ozempic  to 1mg  weekly starting(shipment arrived) Patient did not previously decrease doses and is having hypoglycemia in the AM still.  Received [employer] in [employer] - Patient denies personal or family history of multiple endocrine neoplasia type 2, medullary thyroid  cancer; personal history of pancreatitis or gallbladder disease. - Recommend to check glucose using Libre CGM   Follow Up Plan: PCP 02/09/24, PharmD 3 weeks   Mliss Tarry Griffin, PharmD, BCACP, CPP Clinical Pharmacist, [facility]

## 2023-12-02 ENCOUNTER — Ambulatory Visit: Payer: Self-pay | Admitting: Internal Medicine

## 2023-12-05 ENCOUNTER — Emergency Department (HOSPITAL_BASED_OUTPATIENT_CLINIC_OR_DEPARTMENT_OTHER)
Admission: EM | Admit: 2023-12-05 | Discharge: 2023-12-05 | Disposition: A | Discharge status: Home / Self Care | Attending: Emergency Medicine | Admitting: Emergency Medicine

## 2023-12-05 ENCOUNTER — Other Ambulatory Visit: Payer: Self-pay

## 2023-12-05 ENCOUNTER — Emergency Department (HOSPITAL_BASED_OUTPATIENT_CLINIC_OR_DEPARTMENT_OTHER): Admitting: Radiology

## 2023-12-05 DIAGNOSIS — E119 Type 2 diabetes mellitus without complications: Secondary | ICD-10-CM | POA: Insufficient documentation

## 2023-12-05 DIAGNOSIS — Z7982 Long term (current) use of aspirin: Secondary | ICD-10-CM | POA: Insufficient documentation

## 2023-12-05 DIAGNOSIS — R059 Cough, unspecified: Secondary | ICD-10-CM | POA: Diagnosis not present

## 2023-12-05 DIAGNOSIS — M791 Myalgia, unspecified site: Secondary | ICD-10-CM | POA: Diagnosis not present

## 2023-12-05 DIAGNOSIS — B349 Viral infection, unspecified: Secondary | ICD-10-CM | POA: Insufficient documentation

## 2023-12-05 DIAGNOSIS — Z794 Long term (current) use of insulin: Secondary | ICD-10-CM | POA: Insufficient documentation

## 2023-12-05 LAB — RESP PANEL BY RT-PCR (RSV, FLU A&B, COVID)  RVPGX2
Influenza A by PCR: NEGATIVE
Influenza B by PCR: NEGATIVE
Resp Syncytial Virus by PCR: NEGATIVE
SARS Coronavirus 2 by RT PCR: NEGATIVE

## 2023-12-05 MED ORDER — DOXYCYCLINE HYCLATE 100 MG PO CAPS: 100.0000 mg | ORAL_CAPSULE | Freq: Two times a day (BID) | ORAL | 0 refills | Status: AC

## 2023-12-05 NOTE — ED Provider Notes (Signed)
 [place] [facility]ERGENC[facility] DEPARTMENT AT [facility] Provider Note   CSN: [id number] Arrival date & time: 12/05/23  1017     Patient presents with: Otalgia, Cough, and Knee Pain   Dezirea [name] is a 68 y.o. female.   68 year old female with a past medical history of diabetes presents to the ED with a chief complaint of cough, sore throat, congestion, ear pain has been ongoing for the past week and a half.  Patient reports she was sick with an upper respiratory infection, symptoms dissipated.  Now has had more pain along the right ear, feels like a muffled sensation there.  She was prescribed Xyzal  by her PCP without any improvement in symptoms.  She does use an inhaler sporadically without any improvement.  There is no exacerbating factors.  She denies any tobacco use.  No fevers, chest pain, no shortness of breath, no other complaints reported.  The history is provided by the patient.  Otalgia Associated symptoms: cough   Associated symptoms: no fever   Cough Associated symptoms: ear pain   Associated symptoms: no chest pain, no chills, no fever and no shortness of breath   Knee Pain Associated symptoms: no fever        Prior to Admission medications   Medication Sig Start Date End Date Taking? Authorizing Provider  doxycycline (VIBRAMYCIN) 100 MG capsule Take 1 capsule (100 mg total) by mouth 2 (two) times daily for 7 days. 12/05/23 12/12/23 Yes Mackay Hanauer, PA-C  acetaminophen  (TYLENOL ) 500 MG tablet Take 1,000 mg by mouth every 8 (eight) hours as needed for moderate pain.    [provider]  albuterol  (PROVENTIL ) (2.5 MG/3ML) 0.083% nebulizer solution Take 3 mLs (2.5 mg total) by nebulization every 6 (six) hours as needed for wheezing or shortness of breath. 03/23/23   [name], Marry Lenis, FNP  albuterol  (VENTOLIN  HFA) 108 (90 Base) MCG/ACT inhaler Inhale 2 puffs into the lungs every 6 (six) hours as needed for wheezing or shortness of breath. 05/26/23   [name],  Marry Lenis, FNP  amLODipine  (NORVASC ) 10 MG tablet Take 1 tablet (10 mg total) by mouth daily. 11/02/23   Severa Rock HERO, FNP  Ascorbic Acid (VITA-C PO) Take 1 tablet by mouth 2 (two) times a week.    [provider]  aspirin  EC 81 MG tablet Take 1 tablet (81 mg total) by mouth daily. Swallow whole. 12/16/22   Shahmehdi, Adriana LABOR, MD  atenolol  (TENORMIN ) 100 MG tablet Take 1 tablet (100 mg total) by mouth every morning. 11/02/23   Severa Rock HERO, FNP  atorvastatin  (LIPITOR) 80 MG tablet Take 1 tablet (80 mg total) by mouth daily. 11/02/23   Severa Rock HERO, FNP  budesonide -glycopyrrolate-formoterol  (BREZTRI  AEROSPHERE) 160-9-4.8 MCG/ACT AERO inhaler Inhale 2 puffs into the lungs 2 (two) times daily. 11/02/23   Severa Rock HERO, FNP  Cholecalciferol 25 MCG (1000 UT) capsule Take 1,000 Units by mouth every other day. 01/07/21   [provider]  Continuous Glucose Receiver (FREESTYLE LIBRE 3 READER) DEVI USE AS DIRECTED 11/13/23   Rakes, Rock HERO, FNP  Continuous Glucose Sensor (FREESTYLE LIBRE 3 PLUS SENSOR) MISC Change sensor every 15 days. DX: E11.65 11/21/23   Severa Rock HERO, FNP  hydrALAZINE  (APRESOLINE ) 25 MG tablet Take 1 tablet (25 mg total) by mouth 3 (three) times daily. Patient taking differently: Take 50 mg by mouth 3 (three) times daily. 07/26/23   [name], Marry Lenis, FNP  Insulin  Glargine (BASAGLAR  KWIKPEN) 100 UNIT/ML Inject 37 Units into the skin  daily. Patient taking differently: Inject 30 Units into the skin daily. 05/23/23   [name], Marry Lenis, FNP  insulin  lispro (HUMALOG  KWIKPEN) 100 UNIT/ML KwikPen Inject 10-15 Units into the skin See admin instructions. 10 units WITH BREAKFAST AND LUNCH, TAKE 15 units at [facility] Patient taking differently: Inject 5-10 Units into the skin 3 (three) times daily. 05/23/23   [name], Marry Lenis, FNP  Insulin  Pen Needle 31G X 6 MM MISC Use to inject insulin  4 times daily as directed. DX: E11.65 04/03/23   Cathlene Marry Lenis, FNP   Lancet Device MISC Check BG prn 03/29/23   [name], Marry Lenis, FNP  levocetirizine (XYZAL ) 5 MG tablet Take 1 tablet (5 mg total) by mouth every evening. 11/02/23   Rakes, Rock HERO, FNP  lisinopril  (ZESTRIL ) 40 MG tablet Take 1 tablet (40 mg total) by mouth daily. 05/30/23   MilianMarry Lenis, FNP  Multiple Vitamin (MULTIVITAMIN ADULT PO) Take 1 tablet by mouth every other day.    [provider]  Nebulizer MISC USE AS DIRECTED 03/23/23   [provider]  Semaglutide , 1 MG/DOSE, 4 MG/3ML SOPN Inject 1 mg as directed once a week. 11/02/23   Severa Rock HERO, FNP    Allergies: Jardiance  [empagliflozin ], Kerendia  [finerenone ], and Metformin  and related    Review of Systems  Constitutional:  Negative for chills and fever.  HENT:  Positive for ear pain.   Respiratory:  Positive for cough. Negative for shortness of breath.   Cardiovascular:  Negative for chest pain.  All other systems reviewed and are negative.   Updated Vital Signs BP (!) 170/67 (BP Location: Right Arm)   Pulse 65   Temp 97.7 F (36.5 C) (Oral)   Resp 14   LMP 03/16/2012   SpO2 97%   Physical Exam Vitals and nursing note reviewed.  Constitutional:      Appearance: Normal appearance.  HENT:     Head: Normocephalic and atraumatic.     Ears:     Comments: Bilateral ears are clear although right does have some cerumen present, able to visualize bilateral TMs.    Nose: Nose normal.     Mouth/Throat:     Mouth: Mucous membranes are moist.     Comments: Oropharynx without any exudates or PTA noted. Cardiovascular:     Rate and Rhythm: Normal rate.  Pulmonary:     Effort: Pulmonary effort is normal.     Comments: Mild wheezing, concern for rales. Abdominal:     General: Abdomen is flat.  Musculoskeletal:     Cervical back: Normal range of motion and neck supple.  Skin:    General: Skin is warm and dry.  Neurological:     Mental Status: She is alert and oriented to person, place, and time.      (all labs ordered are listed, but only abnormal results are displayed) Labs Reviewed  RESP PANEL BY RT-PCR (RSV, FLU A&B, COVID)  [id number]    EKG: None  Radiology: DG Chest 2 View Result Date: 12/05/2023 CLINICAL DATA:  Ongoing cough with myalgias. EXAM: CHEST - 2 VIEW COMPARISON:  12/13/2022 FINDINGS: Loop recorder over the left chest wall. Lungs are adequately inflated without acute airspace process or effusion. Mild prominence of the central pulmonary vessels unchanged. Cardiomediastinal silhouette and remainder of the exam is unchanged. IMPRESSION: No acute cardiopulmonary disease. Electronically Signed   By: Toribio Agreste M.D.   On: 12/05/2023 13:36     Procedures   Medications Ordered in the ED -  No data to display                                  Medical Decision Making Amount and/or Complexity of Data Reviewed Radiology: ordered.   Presented the ED with a chief complaint of cough, ear congestion, chest congestion for the past week and a half.  Has tried over-the-counter medication without any improvement in symptoms.  Does have severe pain of the right ear, there is no active infection noted to the right ear, no pain behind the mastoid to suggest mastoiditis.  Her lungs are somewhat diminished to auscultation.  Chest x-ray does not show any active infiltrate, however I am concerned for occult pneumonia. Has not had any fevers, however does have prior comorbidities such as hypertension, prior stroke, diabetes, CKD therefore concerned that patient will likely worsen without any treatment.  We discussed providing her with a short course of antibiotics to help with her symptoms at this time.  She is agreeable of this therapy despite negative x-ray.  Return precautions discussed at length, patient hemodynamically stable for discharge.  Respiratory negative for influenza, COVID, RSV.  Portions of this note were generated with Scientist, clinical (histocompatibility and immunogenetics). Dictation errors may  occur despite best attempts at proofreading.   Final diagnoses:  Viral illness    ED Discharge Orders          Ordered    doxycycline (VIBRAMYCIN) 100 MG capsule  2 times daily        12/05/23 1344               Yvette Loveless, PA-C 12/05/23 1346    Freddi Hamilton, MD 12/06/23 [phone]

## 2023-12-05 NOTE — ED Triage Notes (Signed)
 Patient states right ear pain for 2-3 weeks. Also reports congestion for 'longer than the ear pain.' Reports knee problems for several years but states right knee has been hurting worse recently.

## 2023-12-05 NOTE — Discharge Instructions (Addendum)
 You are prescribed a short course of antibiotics, please take 1 tablet twice a day for the next 7 days.  Your chest x-ray was clear, but in the setting of your comorbidities we will treat you for pneumonia on today's visit.

## 2023-12-05 NOTE — ED Notes (Signed)

## 2023-12-11 NOTE — Progress Notes (Signed)
 Remote Loop Recorder Transmission

## 2023-12-13 ENCOUNTER — Ambulatory Visit: Admitting: Family Medicine

## 2023-12-13 ENCOUNTER — Encounter: Payer: Self-pay | Admitting: Family Medicine

## 2023-12-13 VITALS — BP 143/68 | HR 65 | Temp 97.7°F | Ht 65.0 in | Wt 210.2 lb

## 2023-12-13 DIAGNOSIS — H6591 Unspecified nonsuppurative otitis media, right ear: Secondary | ICD-10-CM

## 2023-12-13 DIAGNOSIS — J410 Simple chronic bronchitis: Secondary | ICD-10-CM | POA: Diagnosis not present

## 2023-12-13 DIAGNOSIS — J069 Acute upper respiratory infection, unspecified: Secondary | ICD-10-CM

## 2023-12-13 MED ORDER — PREDNISONE 20 MG PO TABS: 40.0000 mg | ORAL_TABLET | Freq: Every day | ORAL | 0 refills | Status: AC

## 2023-12-13 MED ORDER — ALBUTEROL SULFATE HFA 108 (90 BASE) MCG/ACT IN AERS: 2.0000 | INHALATION_SPRAY | Freq: Four times a day (QID) | RESPIRATORY_TRACT | 2 refills | Status: AC | PRN

## 2023-12-13 MED ORDER — FLUTICASONE PROPIONATE 50 MCG/ACT NA SUSP: 2.0000 | Freq: Every day | NASAL | 6 refills | Status: AC

## 2023-12-13 NOTE — Progress Notes (Signed)
 Subjective:  Patient ID: Brooke [name], female    DOB: December 14, 1955, 68 y.o.   MRN: [id number]  Patient Care Team: Severa Rock HERO, FNP as PCP - General (Family Medicine) Billee Mliss BIRCH, [facility] (Pharmacist) Vicci Mcardle, OD (Optometry) Octavia, Charlie Hamilton, MD as Consulting Physician (Ophthalmology) Rachele Gaynell RAMAN, MD as Referring Physician (Nephrology)   Chief Complaint:  ER follow up  (12/05/2023 (3 hours)/[place] [facility] at [facility]- viral illness. C/o right ear pain )   HPI: Brooke [name] is a 68 y.o. female presenting on 12/13/2023 for ER follow up  (12/05/2023 (3 hours)/[place] [facility] at [facility]- viral illness. C/o right ear pain )   Brooke [name] is a 68 year old female who presents with persistent cough and ear pain following a recent viral illness.  She visited the emergency room on September 30th for a viral illness characterized by ear pain, cough, and wheezing. She was prescribed doxycycline, which she completed, and her COVID-19, flu, and RSV tests were negative.  Since the ER visit, she continues to experience congestion and cough, particularly when lying down at night. She is not currently taking any medication specifically for the cough but uses her inhaler daily. She uses Breztri  twice daily as her maintenance inhaler and occasionally uses a nebulizer. She does not have albuterol  at home.  She also reports persistent ear pain, describing it as feeling 'stopped up' and occasionally painful. She hears her heartbeat in her ear, which disrupts her sleep. She is not currently using Flonase  or any nasal sprays.  She has a history of diabetes and is concerned about the potential impact of medications on her blood sugar levels.   Relevant past medical, surgical, family, and social history reviewed and updated as indicated.  Allergies and medications reviewed and updated. Data reviewed: Chart in Epic.   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   Hypertension    Hyperuricemia 11/27/2020   Obesity (BMI 35.0-39.9 without comorbidity)    Renal mass 08/04/2021   Vertigo    Vitamin D  insufficiency 11/27/2020    Past Surgical History:  Procedure Laterality Date   BREAST SURGERY Right 1974   OPERATIVE ULTRASOUND N/A 08/04/2021   Procedure: OPERATIVE ULTRASOUND;  Surgeon: Alvaro Hummer, MD;  Location: [facility];  Service: Urology;  Laterality: N/A;   ROBOTIC ASSITED PARTIAL NEPHRECTOMY Right 08/04/2021   Procedure: XI ROBOTIC ASSITED RADICAL NEPHRECTOMY;  Surgeon: Alvaro Hummer, MD;  Location: [facility];  Service: Urology;  Laterality: Right;    Social History   Socioeconomic History   Marital status: Married    Spouse name: Brooke [name]   Number of children: 1   Years of education: Not on file   Highest education level: Not on file  Occupational History   Not on file  Tobacco Use   Smoking status: Never   Smokeless tobacco: Never  Vaping Use   Vaping status: Never Used  Substance and Sexual Activity   Alcohol use: No   Drug use: No   Sexual activity: Never  Other Topics Concern   Not on file  Social History Narrative   1 daughter  1 grandson      Lives with husband    Retired    Teacher, early years/pre 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  (04/28/2023)   Overall Financial Resource Strain (CARDIA)    Difficulty of Paying Living Expenses: Not hard at all  Food Insecurity: No Food Insecurity (04/28/2023)   Hunger Vital Sign    Worried About Running Out of Food in the Last Year: Never true    Ran Out of Food in the Last Year: Never true  Transportation Needs: No Transportation Needs (04/28/2023)   PRAPARE  - Administrator, Civil Service (Medical): No    Lack of Transportation (Non-Medical): No  Physical Activity: Inactive (04/28/2023)   Exercise Vital Sign    Days of Exercise per Week: 0 days    Minutes of Exercise per Session: 0 min  Stress: No Stress Concern Present (04/28/2023)   [employer] of Occupational Health - Occupational Stress Questionnaire    Feeling of Stress : Not at all  Social Connections: Moderately Integrated (04/28/2023)   Social Connection and Isolation Panel    Frequency of Communication with Friends and Family: More than three times a week    Frequency of Social Gatherings with Friends and Family: Three times a week    Attends Religious Services: More than 4 times per year    Active Member of Clubs or Organizations: No    Attends Banker Meetings: Never    Marital Status: Married  Catering manager Violence: Not At Risk (04/28/2023)   Humiliation, Afraid, Rape, and Kick questionnaire    Fear of Current or Ex-Partner: No    Emotionally Abused: No    Physically Abused: No    Sexually Abused: No    Outpatient Encounter Medications as of 12/13/2023  Medication Sig   acetaminophen  (TYLENOL ) 500 MG tablet Take 1,000 mg by mouth every 8 (eight) hours as needed for moderate pain.   albuterol  (PROVENTIL ) (2.5 MG/3ML) 0.083% nebulizer solution Take 3 mLs (2.5 mg total) by nebulization every 6 (six) hours as needed for wheezing or shortness of breath.   amLODipine  (NORVASC ) 10 MG tablet Take 1 tablet (10 mg total) by mouth daily.   Ascorbic Acid (VITA-C PO) Take 1 tablet by mouth 2 (two) times a week.   aspirin  EC 81 MG tablet Take 1 tablet (81 mg total) by mouth daily. Swallow whole.   atenolol  (TENORMIN ) 100 MG tablet Take 1 tablet (100 mg total) by mouth every morning.   atorvastatin  (LIPITOR) 80 MG tablet Take 1 tablet (80 mg total) by mouth daily.   budesonide -glycopyrrolate-formoterol  (BREZTRI  AEROSPHERE) 160-9-4.8 MCG/ACT AERO inhaler  Inhale 2 puffs into the lungs 2 (two) times daily.   calcitRIOL (ROCALTROL) 0.25 MCG capsule Take 0.25 mcg by mouth daily.   Cholecalciferol 25 MCG (1000 UT) capsule Take 1,000 Units by mouth every other day.   Continuous Glucose Receiver (FREESTYLE LIBRE 3 READER) DEVI USE AS DIRECTED   Continuous Glucose Sensor (FREESTYLE LIBRE 3 PLUS SENSOR) MISC Change sensor every 15 days. DX: E11.65   fluticasone  (FLONASE ) 50 MCG/ACT nasal spray Place 2 sprays into both nostrils daily.   hydrALAZINE  (APRESOLINE ) 25 MG tablet Take 1 tablet (25 mg total) by mouth 3 (three) times daily. (Patient taking differently: Take 50 mg by mouth 3 (three) times daily.)   Insulin  Glargine (BASAGLAR  KWIKPEN) 100 UNIT/ML Inject 37 Units into the skin daily. (Patient taking differently: Inject 30 Units into the skin daily.)   insulin  lispro (  HUMALOG  KWIKPEN) 100 UNIT/ML KwikPen Inject 10-15 Units into the skin See admin instructions. 10 units WITH BREAKFAST AND LUNCH, TAKE 15 units at [facility] (Patient taking differently: Inject 5-10 Units into the skin 3 (three) times daily.)   Insulin  Pen Needle 31G X 6 MM MISC Use to inject insulin  4 times daily as directed. DX: E11.65   Lancet Device MISC Check BG prn   levocetirizine (XYZAL ) 5 MG tablet Take 1 tablet (5 mg total) by mouth every evening.   lisinopril  (ZESTRIL ) 40 MG tablet Take 1 tablet (40 mg total) by mouth daily.   Multiple Vitamin (MULTIVITAMIN ADULT PO) Take 1 tablet by mouth every other day.   Nebulizer MISC USE AS DIRECTED   predniSONE  (DELTASONE ) 20 MG tablet Take 2 tablets (40 mg total) by mouth daily with breakfast for 5 days.   Semaglutide , 1 MG/DOSE, 4 MG/3ML SOPN Inject 1 mg as directed once a week.   [DISCONTINUED] albuterol  (VENTOLIN  HFA) 108 (90 Base) MCG/ACT inhaler Inhale 2 puffs into the lungs every 6 (six) hours as needed for wheezing or shortness of breath.   albuterol  (VENTOLIN  HFA) 108 (90 Base) MCG/ACT inhaler Inhale 2 puffs into the lungs every 6  (six) hours as needed for wheezing or shortness of breath.   No facility-administered encounter medications on file as of 12/13/2023.    Allergies  Allergen Reactions   Jardiance  [Empagliflozin ]     Holding per nephro, pt reports no allergy, Dr just didn't want pt taking it   Kerendia  [Finerenone ]     Holding per nephro, pt reports no allergy, Dr just didn't want pt taking it   Metformin  And Related     Contraindicated due to GFR; GI side effects Pt states not a real allergy    Pertinent ROS per HPI, otherwise unremarkable      Objective:  BP (!) 143/68   Pulse 65   Temp 97.7 F (36.5 C)   Ht 5' 5 (1.651 m)   Wt 210 lb 3.2 oz (95.3 kg)   LMP 03/16/2012   SpO2 95%   BMI 34.98 kg/m    Wt Readings from Last 3 Encounters:  12/13/23 210 lb 3.2 oz (95.3 kg)  11/02/23 216 lb (98 kg)  07/26/23 220 lb (99.8 kg)    Physical Exam Vitals and nursing note reviewed.  Constitutional:      General: She is not in acute distress.    Appearance: Normal appearance. She is morbidly obese. She is not ill-appearing, toxic-appearing or diaphoretic.  HENT:     Head: Normocephalic and atraumatic.     Right Ear: A middle ear effusion is present.     Left Ear: Hearing, tympanic membrane, ear canal and external ear normal.     Nose: Nose normal.     Mouth/Throat:     Mouth: Mucous membranes are moist.  Eyes:     Conjunctiva/sclera: Conjunctivae normal.     Pupils: Pupils are equal, round, and reactive to light.  Cardiovascular:     Rate and Rhythm: Normal rate and regular rhythm.     Heart sounds: Normal heart sounds.  Pulmonary:     Effort: Pulmonary effort is normal.     Breath sounds: Wheezing (minimal) present.  Musculoskeletal:     Cervical back: Neck supple.     Right lower leg: No edema.     Left lower leg: No edema.  Lymphadenopathy:     Cervical: No cervical adenopathy.  Skin:    General: Skin is warm  and dry.     Capillary Refill: Capillary refill takes less than 2  seconds.  Neurological:     General: No focal deficit present.     Mental Status: She is alert and oriented to person, place, and time.  Psychiatric:        Mood and Affect: Mood normal.        Behavior: Behavior normal. Behavior is cooperative.        Thought Content: Thought content normal.        Judgment: Judgment normal.      Results for orders placed or performed during the hospital encounter of 12/05/23  Resp panel by RT-PCR (RSV, Flu A&B, Covid) Anterior Nasal Swab   Collection Time: 12/05/23 10:25 AM   Specimen: Anterior Nasal Swab  Result Value Ref Range   SARS Coronavirus 2 by RT PCR NEGATIVE NEGATIVE   Influenza A by PCR NEGATIVE NEGATIVE   Influenza B by PCR NEGATIVE NEGATIVE   Resp Syncytial Virus by PCR NEGATIVE NEGATIVE       Pertinent labs & imaging results that were available during my care of the patient were reviewed by me and considered in my medical decision making.  Assessment & Plan:  Elyn was seen today for er follow up .  Diagnoses and all orders for this visit:  Simple chronic bronchitis (HCC) -     albuterol  (VENTOLIN  HFA) 108 (90 Base) MCG/ACT inhaler; Inhale 2 puffs into the lungs every 6 (six) hours as needed for wheezing or shortness of breath. -     predniSONE  (DELTASONE ) 20 MG tablet; Take 2 tablets (40 mg total) by mouth daily with breakfast for 5 days.  URI with cough and congestion -     albuterol  (VENTOLIN  HFA) 108 (90 Base) MCG/ACT inhaler; Inhale 2 puffs into the lungs every 6 (six) hours as needed for wheezing or shortness of breath. -     predniSONE  (DELTASONE ) 20 MG tablet; Take 2 tablets (40 mg total) by mouth daily with breakfast for 5 days. -     fluticasone  (FLONASE ) 50 MCG/ACT nasal spray; Place 2 sprays into both nostrils daily.  Fluid level behind tympanic membrane of right ear -     predniSONE  (DELTASONE ) 20 MG tablet; Take 2 tablets (40 mg total) by mouth daily with breakfast for 5 days. -     fluticasone  (FLONASE ) 50  MCG/ACT nasal spray; Place 2 sprays into both nostrils daily.      Acute bronchitis with cough and congestion Persistent cough and congestion, primarily at night, following a recent viral illness. Chest X-ray was clear, and COVID-19, flu, and RSV tests were negative. Currently using Breztri  daily and nebulizer occasionally. No current use of albuterol  inhaler. Shortness of breath may be related to kidney function despite adequate urination. - Prescribe albuterol  inhaler for use four times a day, especially before bed, to manage cough and congestion. - Continue Breztri  daily as maintenance therapy. - Prescribe a short course of prednisone  to reduce inflammation.  Eustachian tube dysfunction with middle ear effusion Intermittent ear pain and sensation of ear being stopped up, with audible heartbeat in the ear. Likely due to fluid in the eustachian tube. No current use of Flonase . - Prescribe Flonase  for daily use to reduce fluid in the ear. - Prescribe a short course of prednisone  to decrease inflammation in the eustachian tube.  Type 2 diabetes mellitus Diabetes management considered in the context of prescribing prednisone , which can elevate blood sugar levels.  Continue all other maintenance medications.  Follow up plan: Return if symptoms worsen or fail to improve.   Continue healthy lifestyle choices, including diet (rich in fruits, vegetables, and lean proteins, and low in salt and simple carbohydrates) and exercise (at least 30 minutes of moderate physical activity daily).  Educational handout given for URI  The above assessment and management plan was discussed with the patient. The patient verbalized understanding of and has agreed to the management plan. Patient is aware to call the clinic if they develop any new symptoms or if symptoms persist or worsen. Patient is aware when to return to the clinic for a follow-up visit. Patient educated on when it is appropriate to  go to the emergency department.   Rosaline Bruns, FNP-C [facility] [phone]

## 2023-12-25 ENCOUNTER — Other Ambulatory Visit: Payer: Self-pay | Admitting: Family Medicine

## 2023-12-25 NOTE — Telephone Encounter (Signed)
 Copied from CRM #[id number]. Topic: Clinical - Medication Refill >> Dec 25, 2023  8:49 AM Carrielelia G wrote: Medication: aspirin  EC 81 MG tablet  Has the patient contacted their pharmacy? Yes (Agent: If no, request that the patient contact the pharmacy for the refill. If patient does not wish to contact the pharmacy document the reason why and proceed with request.) (Agent: If yes, when and what did the pharmacy advise?)  This is the patient's preferred pharmacy:  [id number] - [place], [place] - [address] [place] [address] Phone: [phone] Fax: [phone]   Is this the correct pharmacy for this prescription? Yes If no, delete pharmacy and type the correct one.    Is the patient out of the medication? Yes  Has the patient been seen for an appointment in the last year OR does the patient have an upcoming appointment? Yes  Can we respond through MyChart? No  Agent: Please be advised that Rx refills may take up to 3 business days. We ask that you follow-up with your pharmacy.

## 2023-12-26 MED ORDER — ASPIRIN 81 MG PO TBEC: 81.0000 mg | DELAYED_RELEASE_TABLET | Freq: Every day | ORAL | 11 refills | Status: AC

## 2023-12-28 ENCOUNTER — Encounter

## 2023-12-29 ENCOUNTER — Ambulatory Visit: Attending: Internal Medicine

## 2023-12-29 DIAGNOSIS — I639 Cerebral infarction, unspecified: Secondary | ICD-10-CM | POA: Diagnosis not present

## 2023-12-30 LAB — CUP PACEART REMOTE DEVICE CHECK
Date Time Interrogation Session: 20251023233937
Implantable Pulse Generator Implant Date: 20250519

## 2024-01-02 NOTE — Progress Notes (Signed)
 Remote Loop Recorder Transmission

## 2024-01-04 ENCOUNTER — Telehealth: Payer: Self-pay

## 2024-01-04 ENCOUNTER — Ambulatory Visit: Payer: Self-pay | Admitting: Internal Medicine

## 2024-01-04 NOTE — Telephone Encounter (Signed)
 In process of completing Novo Nordisk refills for patients OZEMPIC  1MG  DOSE PENS medication.  Application given to Julie P for completion.

## 2024-01-04 NOTE — Telephone Encounter (Signed)
 PAP: RE-ENROLLMENT application for Breztri  has been submitted to [employer] ([employer]), via fax.

## 2024-01-04 NOTE — Telephone Encounter (Signed)
 Due to PAP ending 2026, will d/c and re-enroll patient for Trulicity  PAP with [employer].   Refills not completed.

## 2024-01-04 NOTE — Telephone Encounter (Signed)
 In process of completing [employer] re-enrollment application for Basaglar , Humalog , and Trulicity .

## 2024-01-04 NOTE — Telephone Encounter (Signed)
 [employer] re-enrollment application for Breztri  in process of being completed.

## 2024-01-04 NOTE — Telephone Encounter (Signed)
 PAP: RE-ENROLLMENT application for Basaglar , Humalog , and Trulicity  has been submitted to [employer], via fax  Please send a new 90 day RX for all meds to [facility] for re-enrollment. Thanks!

## 2024-01-05 ENCOUNTER — Telehealth: Payer: Self-pay | Admitting: Pharmacist

## 2024-01-05 DIAGNOSIS — Z794 Long term (current) use of insulin: Secondary | ICD-10-CM

## 2024-01-05 MED ORDER — INSULIN LISPRO (1 UNIT DIAL) 100 UNIT/ML (KWIKPEN)
PEN_INJECTOR | SUBCUTANEOUS | 5 refills | Status: AC
Start: 2024-01-05 — End: ?

## 2024-01-05 MED ORDER — TRULICITY 3 MG/0.5ML ~~LOC~~ SOAJ: 3.0000 mg | SUBCUTANEOUS | 5 refills | Status: AC

## 2024-01-05 MED ORDER — BASAGLAR KWIKPEN 100 UNIT/ML ~~LOC~~ SOPN: 30.0000 [IU] | PEN_INJECTOR | Freq: Every day | SUBCUTANEOUS | 3 refills | Status: AC

## 2024-01-05 NOTE — Telephone Encounter (Signed)
 Switching back to trulicity  (ozempic  program d/c'd) Renew lilly PAP eRX sent to [facility] pharmacy for lilly cares (basaglar , humalog , trulicity )  Mliss Tarry Griffin, PharmD, BCACP, CPP Clinical Pharmacist, [facility]

## 2024-01-11 NOTE — Telephone Encounter (Signed)
 PAP: Patient assistance application for Basaglar , Humalog , and Trulicity  has been approved by PAP Companies: [employer] from 01/08/24 to 03/06/25.   Medication should be delivered to patients home.   For further shipping updates, please contact [employer] at [phone].

## 2024-01-25 ENCOUNTER — Telehealth: Payer: Self-pay | Admitting: Pharmacist

## 2024-01-25 DIAGNOSIS — J410 Simple chronic bronchitis: Secondary | ICD-10-CM

## 2024-01-25 MED ORDER — BREZTRI AEROSPHERE 160-9-4.8 MCG/ACT IN AERO: 2.0000 | INHALATION_SPRAY | Freq: Two times a day (BID) | RESPIRATORY_TRACT | 5 refills | Status: AC

## 2024-01-25 NOTE — Telephone Encounter (Signed)
 Assisted with new RX for Breztri  sent [employer] for [employer]me re-enrollment.  Patient received letter that new RX was requested.  Patient stable on current therapy.  Zakyah Yanes Dattero Raife Lizer, PharmD, BCACP, CPP Clinical Pharmacist, [facility]

## 2024-01-29 ENCOUNTER — Encounter

## 2024-01-29 ENCOUNTER — Ambulatory Visit

## 2024-01-29 DIAGNOSIS — I639 Cerebral infarction, unspecified: Secondary | ICD-10-CM | POA: Diagnosis not present

## 2024-01-30 ENCOUNTER — Ambulatory Visit: Payer: Self-pay

## 2024-01-30 LAB — CUP PACEART REMOTE DEVICE CHECK
Date Time Interrogation Session: 20251124002807
Implantable Pulse Generator Implant Date: 20250519

## 2024-01-30 NOTE — Progress Notes (Signed)
 Remote Loop Recorder Transmission

## 2024-01-30 NOTE — Telephone Encounter (Signed)
 FYI Only or Action Required?: FYI only for provider: UC ADvised.  Patient was last seen in primary care on 12/13/2023 by Severa Rock HERO, FNP.  Called Nurse Triage reporting Otalgia.  Symptoms began today.  Interventions attempted: OTC medications: Goody Powder.  Symptoms are: gradually worsening.  Triage Disposition: See HCP Within 4 Hours (Or PCP Triage)  Patient/caregiver understands and will follow disposition?: Yes  Copied from CRM #[id number]. Topic: Clinical - Red Word Triage >> Jan 30, 2024  1:39 PM Larissa S wrote: [facility] that prompted transfer to Nurse Triage: severe pain- RT ear Reason for Disposition  [1] SEVERE pain (e.g., excruciating) and [2] not improved 2 hours after pain medicine (e.g., acetaminophen  or ibuprofen )  Answer Assessment - Initial Assessment Questions Started this morning- rt ear pain this morning, sore throat and chills last night but since resolved. Same ear 3 months ago- was having decreased hearing and heard a buzzing sound like a cicada- just said she had wax buildup  Goody powder- took the edge off.  Appt found for 11/26- pt would like to be seen today- Urgent Care advise to assess ear. Wait times reviewed. Patient will head to UC.  1. LOCATION: Which ear is involved?     Rt ear on the inside  2. ONSET: When did the ear pain start?      This morning 3. SEVERITY: How bad is the pain?  (Scale 1-10; mild, moderate or severe)     8-10/10 pain 4. URI SYMPTOMS: Do you have a runny nose or cough?     Sore throat last night- temple aches on the side of ear pain 5. FEVER: Do you have a fever? If Yes, ask: What is your temperature, how was it measured, and when did it start?     Chills last night but gone now 6. CAUSE: Have you been swimming recently?, How often do you use Q-TIPS?, Have you had any recent air travel or scuba diving?     Denies  7. OTHER SYMPTOMS: Do you have any other symptoms? (e.g., decreased hearing, dizziness,  headache, stiff neck, vomiting)     Soreness on the side of ear pain  Protocols used: Earache-A-AH

## 2024-01-30 NOTE — Telephone Encounter (Signed)
 FYI noted

## 2024-02-05 ENCOUNTER — Encounter: Payer: Self-pay | Admitting: Family Medicine

## 2024-02-07 ENCOUNTER — Ambulatory Visit

## 2024-02-07 ENCOUNTER — Encounter: Payer: Self-pay | Admitting: Family Medicine

## 2024-02-07 ENCOUNTER — Ambulatory Visit: Payer: Self-pay | Admitting: Internal Medicine

## 2024-02-07 VITALS — BP 155/62 | HR 63 | Temp 97.2°F | Ht 65.0 in | Wt 217.6 lb

## 2024-02-07 DIAGNOSIS — H9201 Otalgia, right ear: Secondary | ICD-10-CM

## 2024-02-07 DIAGNOSIS — H6991 Unspecified Eustachian tube disorder, right ear: Secondary | ICD-10-CM

## 2024-02-07 NOTE — Progress Notes (Signed)
 Subjective:  Patient ID: Brooke [name], female    DOB: 05-26-1955, 68 y.o.   MRN: [id number]  Patient Care Team: Severa Rock HERO, FNP as PCP - General (Family Medicine) Billee Mliss BIRCH, RPH-CPP (Pharmacist) Vicci Mcardle, OD (Optometry) Octavia, Charlie Hamilton, MD as Consulting Physician (Ophthalmology) Rachele Gaynell RAMAN, MD as Referring Physician (Nephrology)   Chief Complaint:  Ear Pain (Right/) and Dizziness (Started yesterday )   HPI: Brooke [name] is a 68 y.o. female presenting on 02/07/2024 for Ear Pain (Right/) and Dizziness (Started yesterday )   Brooke [name] is a 68 year old female who presents with persistent right ear pain.  She has been experiencing excruciating pain in her right ear since last Tuesday, which has persisted despite treatment. The pain was first noticed upon waking, prompting her to seek care at urgent care, where fluid in her ear was identified. She was prescribed a nasal spray and later received augmentin  and prednisone .  She underwent an ear cleaning procedure at urgent care involving a spray bottle, which caused significant discomfort and dizziness.   She hears her heartbeat in her ear, which she finds 'annoying' and states it has made sleeping difficult, although she is now able to sleep despite the discomfort.  Her current medications include Xyzal  (levocetirizine) taken nightly for allergies, and she mentions needing a refill for her nasal spray, flonase          Relevant past medical, surgical, family, and social history reviewed and updated as indicated.  Allergies and medications reviewed and updated. Data reviewed: Chart in Epic.   Past Medical History:  Diagnosis Date   Anemia    Arthritis    Bronchitis    frequent bronchitis   Diabetes mellitus    Diabetic nephropathy (HCC)    Hepatic steatosis    History of renal cell carcinoma 08/04/2021   Clear cell, nuclear grade 4, size 5.3 cm, S/P nephrectomy   History of right  nephrectomy 09/22/2021   Hypertension    Hyperuricemia 11/27/2020   Obesity (BMI 35.0-39.9 without comorbidity)    Renal mass 08/04/2021   Vertigo    Vitamin D  insufficiency 11/27/2020    Past Surgical History:  Procedure Laterality Date   BREAST SURGERY Right 1974   OPERATIVE ULTRASOUND N/A 08/04/2021   Procedure: OPERATIVE ULTRASOUND;  Surgeon: Alvaro Hummer, MD;  Location: [facility];  Service: Urology;  Laterality: N/A;   ROBOTIC ASSITED PARTIAL NEPHRECTOMY Right 08/04/2021   Procedure: XI ROBOTIC ASSITED RADICAL NEPHRECTOMY;  Surgeon: Alvaro Hummer, MD;  Location: [facility];  Service: Urology;  Laterality: Right;    Social History   Socioeconomic History   Marital status: Married    Spouse name: Donnie   Number of children: 1   Years of education: Not on file   Highest education level: Not on file  Occupational History   Not on file  Tobacco Use   Smoking status: Never   Smokeless tobacco: Never  Vaping Use   Vaping status: Never Used  Substance and Sexual Activity   Alcohol use: No   Drug use: No   Sexual activity: Never  Other Topics Concern   Not on file  Social History Narrative   1 daughter    1 grandson      Lives with husband    Retired    Chief Executive Officer Drivers of Corporate Investment Banker 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  (04/28/2023)   Overall Financial Resource Strain (CARDIA)    Difficulty of Paying Living Expenses: Not  hard at all  Food Insecurity: No Food Insecurity (04/28/2023)   Hunger Vital Sign    Worried About Running Out of Food in the Last Year: Never true    Ran Out of Food in the Last Year: Never true  Transportation Needs: No Transportation Needs (04/28/2023)   PRAPARE - Administrator, Civil Service (Medical): No    Lack of Transportation (Non-Medical): No  Physical Activity: Inactive (04/28/2023)   Exercise Vital Sign    Days of Exercise per Week: 0 days    Minutes of Exercise per Session: 0 min  Stress: No Stress Concern Present (04/28/2023)    [employer] of Occupational Health - Occupational Stress Questionnaire    Feeling of Stress : Not at all  Social Connections: Moderately Integrated (04/28/2023)   Social Connection and Isolation Panel    Frequency of Communication with Friends and Family: More than three times a week    Frequency of Social Gatherings with Friends and Family: Three times a week    Attends Religious Services: More than 4 times per year    Active Member of Clubs or Organizations: No    Attends Banker Meetings: Never    Marital Status: Married  Catering Manager Violence: Not At Risk (04/28/2023)   Humiliation, Afraid, Rape, and Kick questionnaire    Fear of Current or Ex-Partner: No    Emotionally Abused: No    Physically Abused: No    Sexually Abused: No    Outpatient Encounter Medications as of 02/07/2024  Medication Sig   acetaminophen  (TYLENOL ) 500 MG tablet Take 1,000 mg by mouth every 8 (eight) hours as needed for moderate pain.   albuterol  (PROVENTIL ) (2.5 MG/3ML) 0.083% nebulizer solution Take 3 mLs (2.5 mg total) by nebulization every 6 (six) hours as needed for wheezing or shortness of breath.   albuterol  (VENTOLIN  HFA) 108 (90 Base) MCG/ACT inhaler Inhale 2 puffs into the lungs every 6 (six) hours as needed for wheezing or shortness of breath.   amLODipine  (NORVASC ) 10 MG tablet Take 1 tablet (10 mg total) by mouth daily.   Ascorbic Acid (VITA-C PO) Take 1 tablet by mouth 2 (two) times a week.   aspirin  EC 81 MG tablet Take 1 tablet (81 mg total) by mouth daily. Swallow whole.   atenolol  (TENORMIN ) 100 MG tablet Take 1 tablet (100 mg total) by mouth every morning.   atorvastatin  (LIPITOR) 80 MG tablet Take 1 tablet (80 mg total) by mouth daily.   budesonide -glycopyrrolate-formoterol  (BREZTRI  AEROSPHERE) 160-9-4.8 MCG/ACT AERO inhaler Inhale 2 puffs into the lungs 2 (two) times daily.   calcitRIOL (ROCALTROL) 0.25 MCG capsule Take 0.25 mcg by mouth daily.   Cholecalciferol 25  MCG (1000 UT) capsule Take 1,000 Units by mouth every other day.   Continuous Glucose Receiver (FREESTYLE LIBRE 3 READER) DEVI USE AS DIRECTED   Continuous Glucose Sensor (FREESTYLE LIBRE 3 PLUS SENSOR) MISC Change sensor every 15 days. DX: E11.65   Dulaglutide  (TRULICITY ) 3 MG/0.5ML SOAJ Inject 3 mg into the skin once a week.   fluticasone  (FLONASE ) 50 MCG/ACT nasal spray Place 2 sprays into both nostrils daily.   hydrALAZINE  (APRESOLINE ) 25 MG tablet Take 1 tablet (25 mg total) by mouth 3 (three) times daily. (Patient taking differently: Take 50 mg by mouth 3 (three) times daily.)   Insulin  Glargine (BASAGLAR  KWIKPEN) 100 UNIT/ML Inject 30 Units into the skin daily.   insulin  lispro (HUMALOG  KWIKPEN) 100 UNIT/ML KwikPen Inject 5-10 units 3 times daily WITH  BREAKFAST AND LUNCH and DINNER   Insulin  Pen Needle 31G X 6 MM MISC Use to inject insulin  4 times daily as directed. DX: E11.65   Lancet Device MISC Check BG prn   levocetirizine (XYZAL ) 5 MG tablet Take 1 tablet (5 mg total) by mouth every evening.   lisinopril  (ZESTRIL ) 40 MG tablet Take 1 tablet (40 mg total) by mouth daily.   Multiple Vitamin (MULTIVITAMIN ADULT PO) Take 1 tablet by mouth every other day.   Nebulizer MISC USE AS DIRECTED   No facility-administered encounter medications on file as of 02/07/2024.    Allergies  Allergen Reactions   Jardiance  [Empagliflozin ]     Holding per nephro, pt reports no allergy, Dr just didn't want pt taking it   Kerendia  [Finerenone ]     Holding per nephro, pt reports no allergy, Dr just didn't want pt taking it   Metformin      Contraindicated due to GFR; GI side effects Pt states not a real allergy   Metformin  And Related     Contraindicated due to GFR; GI side effects Pt states not a real allergy    Pertinent ROS per HPI, otherwise unremarkable      Objective:  BP (!) 155/62   Pulse 63   Temp (!) 97.2 F (36.2 C)   Ht 5' 5 (1.651 m)   Wt 217 lb 9.6 oz (98.7 kg)   LMP  03/16/2012   SpO2 97%   BMI 36.21 kg/m    Wt Readings from Last 3 Encounters:  02/07/24 217 lb 9.6 oz (98.7 kg)  12/13/23 210 lb 3.2 oz (95.3 kg)  11/02/23 216 lb (98 kg)    Physical Exam Vitals and nursing note reviewed.  Constitutional:      General: She is not in acute distress.    Appearance: Normal appearance. She is obese. She is not ill-appearing, toxic-appearing or diaphoretic.  HENT:     Head: Normocephalic and atraumatic.     Right Ear: A middle ear effusion is present. Tympanic membrane is not erythematous or bulging.     Left Ear: Hearing, tympanic membrane, ear canal and external ear normal.     Nose: Nose normal.     Mouth/Throat:     Mouth: Mucous membranes are moist.  Eyes:     Conjunctiva/sclera: Conjunctivae normal.     Pupils: Pupils are equal, round, and reactive to light.  Cardiovascular:     Rate and Rhythm: Normal rate and regular rhythm.     Heart sounds: Normal heart sounds.  Pulmonary:     Effort: Pulmonary effort is normal.     Breath sounds: Normal breath sounds.  Musculoskeletal:     Right lower leg: No edema.     Left lower leg: No edema.  Skin:    General: Skin is warm and dry.     Capillary Refill: Capillary refill takes less than 2 seconds.  Neurological:     General: No focal deficit present.     Mental Status: She is alert and oriented to person, place, and time.  Psychiatric:        Mood and Affect: Mood normal.        Behavior: Behavior normal.        Thought Content: Thought content normal.        Judgment: Judgment normal.    Physical Exam   HEENT: Fluid in right ear        Results for orders placed or performed in visit on  01/29/24  CUP PACEART REMOTE DEVICE CHECK   Collection Time: 01/29/24 12:28 AM  Result Value Ref Range   Date Time Interrogation Session [id number]    Pulse Generator Manufacturer MERM    Pulse Gen Model [id number] GARR HEATH    Pulse Gen Serial Number [id number]    Clinic Name [facility]     Implantable Pulse Generator Type ICM/ILR    Implantable Pulse Generator Implant Date [id number]        Pertinent labs & imaging results that were available during my care of the patient were reviewed by me and considered in my medical decision making.  Assessment & Plan:  Brooke [name] was seen today for ear pain and dizziness.  Diagnoses and all orders for this visit:  Otalgia of right ear -     Ambulatory referral to ENT  Dysfunction of right eustachian tube -     Ambulatory referral to ENT     Right eustachian tube dysfunction with otalgia Chronic right eustachian tube dysfunction with persistent otalgia since last Monday. Previous treatment with amoxicillin  and prednisone . Symptoms include excruciating ear pain, dizziness, and autophony due to fluid accumulation. No signs of infection. Current management includes antihistamines and nasal spray to reduce swelling and facilitate fluid drainage. - Referred to ENT specialist in [facility] for further evaluation and management. - Refilled Flonase  nasal spray and instructed to use daily. - Continue Xyzal  (levocetirizine) nightly for allergy management. - Advised to contact if no ENT appointment is scheduled within 7-10 days.          Continue all other maintenance medications.  Follow up plan: Return if symptoms worsen or fail to improve.   Continue healthy lifestyle choices, including diet (rich in fruits, vegetables, and lean proteins, and low in salt and simple carbohydrates) and exercise (at least 30 minutes of moderate physical activity daily).   The above assessment and management plan was discussed with the patient. The patient verbalized understanding of and has agreed to the management plan. Patient is aware to call the clinic if they develop any new symptoms or if symptoms persist or worsen. Patient is aware when to return to the clinic for a follow-up visit. Patient educated on when it is appropriate to go to the emergency  department.   Rosaline Bruns, FNP-C [facility] [phone]

## 2024-02-08 ENCOUNTER — Encounter (INDEPENDENT_AMBULATORY_CARE_PROVIDER_SITE_OTHER): Payer: Self-pay

## 2024-02-09 ENCOUNTER — Ambulatory Visit: Admitting: Family Medicine

## 2024-02-15 ENCOUNTER — Ambulatory Visit: Payer: Self-pay | Admitting: Family Medicine

## 2024-02-20 NOTE — Telephone Encounter (Signed)
 PAP: Patient assistance RE-ENROLLMENT application for Breztri  has been approved by PAP Companies: [employer] from 03/07/24 to 03/06/25.   Medication should be delivered to: Home.   For further shipping updates, please contact [employer] ([employer]) at [phone].   Patient ID is: [id number]_[id number]

## 2024-02-21 ENCOUNTER — Encounter: Payer: Self-pay | Admitting: Family Medicine

## 2024-02-29 ENCOUNTER — Ambulatory Visit

## 2024-02-29 ENCOUNTER — Encounter

## 2024-02-29 DIAGNOSIS — I639 Cerebral infarction, unspecified: Secondary | ICD-10-CM | POA: Diagnosis not present

## 2024-03-01 LAB — CUP PACEART REMOTE DEVICE CHECK
Date Time Interrogation Session: 20251224234236
Implantable Pulse Generator Implant Date: 20250519

## 2024-03-01 NOTE — Progress Notes (Signed)
 Remote Loop Recorder Transmission

## 2024-03-03 ENCOUNTER — Ambulatory Visit: Payer: Self-pay | Admitting: Internal Medicine

## 2024-03-05 NOTE — Telephone Encounter (Signed)
 Patient informed. LS

## 2024-03-11 ENCOUNTER — Institutional Professional Consult (permissible substitution) (INDEPENDENT_AMBULATORY_CARE_PROVIDER_SITE_OTHER)

## 2024-03-12 ENCOUNTER — Encounter: Payer: Self-pay | Admitting: Family Medicine

## 2024-03-12 ENCOUNTER — Ambulatory Visit: Admitting: Family Medicine

## 2024-03-12 VITALS — BP 161/66 | HR 76 | Temp 97.1°F | Ht 65.0 in | Wt 214.4 lb

## 2024-03-12 DIAGNOSIS — M25561 Pain in right knee: Secondary | ICD-10-CM | POA: Diagnosis not present

## 2024-03-12 DIAGNOSIS — G8929 Other chronic pain: Secondary | ICD-10-CM | POA: Diagnosis not present

## 2024-03-12 MED ORDER — METHYLPREDNISOLONE ACETATE 80 MG/ML IJ SUSP
80.0000 mg | Freq: Once | INTRAMUSCULAR | Status: AC
  Administered 2024-03-12: 60 mg via INTRAMUSCULAR

## 2024-03-12 NOTE — Progress Notes (Signed)
 "    Subjective:  Patient ID: Brooke [name], female    DOB: Mar 12, 1955, 69 y.o.   MRN: [id number]  Patient Care Team: Severa Rock HERO, FNP as PCP - General (Family Medicine) Billee Mliss BIRCH, RPH-CPP (Pharmacist) Vicci Mcardle, OD (Optometry) Octavia, Charlie Hamilton, MD as Consulting Physician (Ophthalmology) Rachele Gaynell RAMAN, MD as Referring Physician (Nephrology)   Chief Complaint:  Knee Pain (Right ongoing knee pain - worse in the last 3-4 months )   HPI: Brooke [name] is a 69 y.o. female presenting on 03/12/2024 for Knee Pain (Right ongoing knee pain - worse in the last 3-4 months )   Brooke [name] is a 69 year old female with osteoarthritis who presents with right knee pain.  She has been experiencing right knee pain for about three months. She reports that x-rays were taken previously and that she was told she has osteoarthritis in the knee. She has consulted an orthopedic specialist three to four times, during which fluid was aspirated from the knee once, and she received three injections. The last injection was in 2024, and she recalls another injection given by Marry, though the exact date is unclear.  Recently, she was on prednisone  around November, which caused her blood sugar levels to rise temporarily for six days before returning to normal. She denies any recent joint injections and has not been on any blood thinners, only blood pressure medication.  No allergies to lidocaine  or steroids. She reports that she has not had any recent injections for a long time.          Relevant past medical, surgical, family, and social history reviewed and updated as indicated.  Allergies and medications reviewed and updated. Data reviewed: Chart in Epic.   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   Hypertension    Hyperuricemia 11/27/2020   Obesity (BMI 35.0-39.9 without comorbidity)    Renal mass 08/04/2021   Vertigo    Vitamin D  insufficiency 11/27/2020    Past Surgical History:  Procedure Laterality Date   BREAST SURGERY Right 1974   OPERATIVE ULTRASOUND N/A 08/04/2021   Procedure: OPERATIVE ULTRASOUND;  Surgeon: Alvaro Hummer, MD;  Location: [facility];  Service: Urology;  Laterality: N/A;   ROBOTIC ASSITED PARTIAL NEPHRECTOMY Right 08/04/2021   Procedure: XI ROBOTIC ASSITED RADICAL NEPHRECTOMY;  Surgeon: Alvaro Hummer, MD;  Location: [facility];  Service: Urology;  Laterality: Right;    Social History   Socioeconomic History   Marital status: Married    Spouse name: Donnie   Number of children: 1   Years of education: Not on file   Highest education level: Not on file  Occupational History   Not on file  Tobacco Use   Smoking status: Never   Smokeless tobacco: Never  Vaping Use   Vaping status: Never Used  Substance and Sexual Activity   Alcohol use: No   Drug use: No   Sexual activity: Never  Other Topics Concern   Not on file  Social History Narrative   1 daughter    1 grandson      Lives with husband    Retired    [name]   Tobacco Use: Low Risk (03/12/2024)   Patient History    Smoking Tobacco Use: Never    Smokeless Tobacco Use: Never    Passive Exposure: Not on file  Financial Resource 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 (04/28/2023)   Overall Financial Resource Strain (CARDIA)    Difficulty of Paying Living Expenses: Not hard at all  Food Insecurity: No Food Insecurity (04/28/2023)   Hunger Vital Sign    Worried About Running Out of Food in the Last Year: Never true    Ran Out of Food in the Last Year: Never true  Transportation Needs: No Transportation Needs (04/28/2023)   PRAPARE - Administrator, Civil Service (Medical): No    Lack  of Transportation (Non-Medical): No  Physical Activity: Inactive (04/28/2023)   Exercise Vital Sign    Days of Exercise per Week: 0 days    Minutes of Exercise per Session: 0 min  Stress: No Stress Concern Present (04/28/2023)   Brooke [name] - Occupational Stress Questionnaire    Feeling of Stress : Not at all  Social Connections: Moderately Integrated (04/28/2023)   Social Connection and Isolation Panel    Frequency of Communication with Friends and Family: More than three times a week    Frequency of Social Gatherings with Friends and Family: Three times a week    Attends Religious Services: More than 4 times per year    Active Member of Clubs or Organizations: No    Attends Banker Meetings: Never    Marital Status: Married  Catering Manager Violence: Not At Risk (04/28/2023)   Humiliation, Afraid, Rape, and Kick questionnaire    Fear of Current or Ex-Partner: No    Emotionally Abused: No    Physically Abused: No    Sexually Abused: No  Depression (PHQ2-9): Low Risk (03/12/2024)   Depression (PHQ2-9)    PHQ-2 Score: 0  Alcohol Screen: Low Risk (04/28/2023)   Alcohol Screen    Last Alcohol Screening Score (AUDIT): 0  Housing: Unknown (04/28/2023)   Housing Stability Vital Sign    Unable to Pay for Housing in the Last Year: No    Number of Times Moved in the Last Year: Not on file    Homeless in the Last Year: No  Utilities: Not At Risk (04/28/2023)   [employer] Utilities    Threatened with loss of utilities: No  [name] Literacy: Adequate [name] Literacy (04/28/2023)   B1300 [name] Literacy    Frequency of need for help with medical instructions: Never    Outpatient Encounter Medications as of 03/12/2024  Medication Sig   acetaminophen  (TYLENOL ) 500 MG tablet Take 1,000 mg by mouth every 8 (eight) hours as needed for moderate pain.   albuterol  (PROVENTIL ) (2.5 MG/3ML) 0.083% nebulizer solution Take 3 mLs (2.5 mg total) by nebulization every 6 (six)  hours as needed for wheezing or shortness of breath.   albuterol  (VENTOLIN  HFA) 108 (90 Base) MCG/ACT inhaler Inhale 2 puffs into the lungs every 6 (six) hours as needed for wheezing or shortness of breath.   amLODipine  (NORVASC ) 10 MG tablet Take 1 tablet (10 mg total) by mouth daily.   Ascorbic Acid (VITA-C PO) Take 1 tablet by mouth 2 (two) times a week.   aspirin  EC 81 MG tablet Take 1 tablet (81 mg total) by mouth daily. Swallow whole.   atenolol  (TENORMIN ) 100 MG tablet Take 1 tablet (100 mg total) by mouth every morning.   atorvastatin  (LIPITOR) 80 MG  tablet Take 1 tablet (80 mg total) by mouth daily.   budesonide -glycopyrrolate-formoterol  (BREZTRI  AEROSPHERE) 160-9-4.8 MCG/ACT AERO inhaler Inhale 2 puffs into the lungs 2 (two) times daily.   calcitRIOL (ROCALTROL) 0.25 MCG capsule Take 0.25 mcg by mouth daily.   Cholecalciferol 25 MCG (1000 UT) capsule Take 1,000 Units by mouth every other day.   Continuous Glucose Receiver (FREESTYLE LIBRE 3 READER) DEVI USE AS DIRECTED   Continuous Glucose Sensor (FREESTYLE LIBRE 3 PLUS SENSOR) MISC Change sensor every 15 days. DX: E11.65   Dulaglutide  (TRULICITY ) 3 MG/0.5ML SOAJ Inject 3 mg into the skin once a week.   fluticasone  (FLONASE ) 50 MCG/ACT nasal spray Place 2 sprays into both nostrils daily.   hydrALAZINE  (APRESOLINE ) 25 MG tablet Take 1 tablet (25 mg total) by mouth 3 (three) times daily. (Patient taking differently: Take 50 mg by mouth 3 (three) times daily.)   Insulin  Glargine (BASAGLAR  KWIKPEN) 100 UNIT/ML Inject 30 Units into the skin daily.   insulin  lispro (HUMALOG  KWIKPEN) 100 UNIT/ML KwikPen Inject 5-10 units 3 times daily WITH BREAKFAST AND LUNCH and DINNER   Insulin  Pen Needle 31G X 6 MM MISC Use to inject insulin  4 times daily as directed. DX: E11.65   Lancet Device MISC Check BG prn   levocetirizine (XYZAL ) 5 MG tablet Take 1 tablet (5 mg total) by mouth every evening.   lisinopril  (ZESTRIL ) 40 MG tablet Take 1 tablet (40 mg  total) by mouth daily.   Multiple Vitamin (MULTIVITAMIN ADULT PO) Take 1 tablet by mouth every other day.   Nebulizer MISC USE AS DIRECTED   No facility-administered encounter medications on file as of 03/12/2024.    Allergies[1]  Pertinent ROS per HPI, otherwise unremarkable      Objective:  BP (!) 161/66   Pulse 76   Temp (!) 97.1 F (36.2 C)   Ht 5' 5 (1.651 m)   Wt 214 lb 6.4 oz (97.3 kg)   LMP 03/16/2012   SpO2 98%   BMI 35.68 kg/m    Wt Readings from Last 3 Encounters:  03/12/24 214 lb 6.4 oz (97.3 kg)  02/07/24 217 lb 9.6 oz (98.7 kg)  12/13/23 210 lb 3.2 oz (95.3 kg)    Physical Exam Vitals reviewed.  Constitutional:      General: She is not in acute distress.    Appearance: Normal appearance. She is obese. She is not ill-appearing, toxic-appearing or diaphoretic.  HENT:     Head: Normocephalic and atraumatic.     Nose: Nose normal.     Mouth/Throat:     Mouth: Mucous membranes are moist.  Eyes:     Pupils: Pupils are equal, round, and reactive to light.  Cardiovascular:     Rate and Rhythm: Normal rate and regular rhythm.     Heart sounds: Normal heart sounds.  Pulmonary:     Effort: Pulmonary effort is normal.     Breath sounds: Normal breath sounds.  Musculoskeletal:     Cervical back: Neck supple.     Right upper leg: Normal.     Right knee: Swelling and effusion present. No deformity, erythema, ecchymosis or lacerations. Normal range of motion. Tenderness present.     Instability Tests: Anterior drawer test negative. Posterior drawer test negative. Anterior Lachman test negative. Medial McMurray test negative and lateral McMurray test negative.     Right lower leg: Normal.  Skin:    General: Skin is warm and dry.     Capillary Refill: Capillary refill takes less  than 2 seconds.  Neurological:     General: No focal deficit present.     Mental Status: She is alert and oriented to person, place, and time.     Gait: Gait abnormal (antalgic).     Joint Injection/Arthrocentesis  Date/Time: 03/12/2024 2:29 PM  Performed by: Severa Rock HERO, FNP Authorized by: Severa Rock HERO, FNP  Indications: joint swelling and pain  Body area: knee Joint: right knee Local anesthesia used: yes  Anesthesia: Local anesthesia used: yes Local Anesthetic: co-phenylcaine spray  Sedation: Patient sedated: no  Preparation: Patient was prepped and draped in the usual sterile fashion. Needle size: 18 G Ultrasound guidance: no Approach: medial Aspirate amount: 0 mL Methylprednisolone  amount: 60 mg Lidocaine  2% amount: 4.5 mL Patient tolerance: patient tolerated the procedure well with no immediate complications     Results for orders placed or performed in visit on 02/29/24  CUP PACEART REMOTE DEVICE CHECK   Collection Time: 02/28/24 11:42 PM  Result Value Ref Range   Date Time Interrogation Session [id number]    Pulse Generator Manufacturer MERM    Pulse Gen Model LNQ22 LINQ II    Pulse Gen Serial Number [id number]    Clinic Name [facility]    Implantable Pulse Generator Type ICM/ILR    Implantable Pulse Generator Implant Date [id number]        Pertinent labs & imaging results that were available during my care of the patient were reviewed by me and considered in my medical decision making.  Assessment & Plan:  Brooke [name] was seen today for knee pain.  Diagnoses and all orders for this visit:  Chronic pain of right knee -     Joint Injection/Arthrocentesis        Unilateral primary osteoarthritis of right knee Chronic right knee pain for approximately three months, previously diagnosed with osteoarthritis. Previous treatments include fluid aspiration and multiple steroid injections, with the last injection in 2024. Current symptoms include significant swelling and pain. No recent joint injections or steroid use since November 2024. Blood sugar levels increased during previous steroid use but normalized after cessation. Risks  of injection include infection, bruising, bleeding, and increased pain. - Administered steroid injection to right knee - Monitor response to injection; if symptoms persist, will consider referral to orthopedics for potential knee replacement          Continue all other maintenance medications.  Follow up plan: Return if symptoms worsen or fail to improve.   Continue healthy lifestyle choices, including diet (rich in fruits, vegetables, and lean proteins, and low in salt and simple carbohydrates) and exercise (at least 30 minutes of moderate physical activity daily).  Educational handout given for knee injection   The above assessment and management plan was discussed with the patient. The patient verbalized understanding of and has agreed to the management plan. Patient is aware to call the clinic if they develop any new symptoms or if symptoms persist or worsen. Patient is aware when to return to the clinic for a follow-up visit. Patient educated on when it is appropriate to go to the emergency department.   Rosaline Severa, FNP-C [facility] [phone]     [1]  Allergies Allergen Reactions   Jardiance  [Empagliflozin ]     Holding per nephro, pt reports no allergy, Dr just didn't want pt taking it   Kerendia  [Finerenone ]     Holding per nephro, pt reports no allergy, Dr just didn't want pt taking it   Metformin   Contraindicated due to GFR; GI side effects Pt states not a real allergy   Metformin  And Related     Contraindicated due to GFR; GI side effects Pt states not a real allergy   "

## 2024-03-12 NOTE — Addendum Note (Signed)
 Addended by: OLENA RAISIN C on: 03/12/2024 02:40 PM   Modules accepted: Orders

## 2024-03-21 ENCOUNTER — Ambulatory Visit: Admitting: Family Medicine

## 2024-03-21 ENCOUNTER — Encounter: Payer: Self-pay | Admitting: Family Medicine

## 2024-03-21 VITALS — BP 152/84 | HR 67 | Temp 97.3°F | Ht 65.0 in | Wt 211.4 lb

## 2024-03-21 DIAGNOSIS — E785 Hyperlipidemia, unspecified: Secondary | ICD-10-CM

## 2024-03-21 DIAGNOSIS — N184 Chronic kidney disease, stage 4 (severe): Secondary | ICD-10-CM | POA: Diagnosis not present

## 2024-03-21 DIAGNOSIS — Z794 Long term (current) use of insulin: Secondary | ICD-10-CM

## 2024-03-21 DIAGNOSIS — I10 Essential (primary) hypertension: Secondary | ICD-10-CM

## 2024-03-21 DIAGNOSIS — E559 Vitamin D deficiency, unspecified: Secondary | ICD-10-CM

## 2024-03-21 DIAGNOSIS — E1165 Type 2 diabetes mellitus with hyperglycemia: Secondary | ICD-10-CM

## 2024-03-21 DIAGNOSIS — Z7985 Long-term (current) use of injectable non-insulin antidiabetic drugs: Secondary | ICD-10-CM

## 2024-03-21 DIAGNOSIS — C641 Malignant neoplasm of right kidney, except renal pelvis: Secondary | ICD-10-CM | POA: Insufficient documentation

## 2024-03-21 DIAGNOSIS — Z905 Acquired absence of kidney: Secondary | ICD-10-CM

## 2024-03-21 DIAGNOSIS — E1159 Type 2 diabetes mellitus with other circulatory complications: Secondary | ICD-10-CM

## 2024-03-21 DIAGNOSIS — Z85528 Personal history of other malignant neoplasm of kidney: Secondary | ICD-10-CM | POA: Insufficient documentation

## 2024-03-21 DIAGNOSIS — E1169 Type 2 diabetes mellitus with other specified complication: Secondary | ICD-10-CM

## 2024-03-21 DIAGNOSIS — I152 Hypertension secondary to endocrine disorders: Secondary | ICD-10-CM | POA: Diagnosis not present

## 2024-03-21 DIAGNOSIS — E1121 Type 2 diabetes mellitus with diabetic nephropathy: Secondary | ICD-10-CM

## 2024-03-21 LAB — BAYER DCA HB A1C WAIVED: HB A1C (BAYER DCA - WAIVED): 6.8 % — ABNORMAL HIGH (ref 4.8–5.6)

## 2024-03-21 NOTE — Patient Instructions (Addendum)

## 2024-03-21 NOTE — Progress Notes (Signed)
 "    Subjective:  Patient ID: Brooke [name], female    DOB: Jan 20, 1956, 69 y.o.   MRN: [id number]  Patient Care Team: Severa Rock HERO, FNP as PCP - General (Family Medicine) Billee Mliss BIRCH, RPH-CPP (Pharmacist) Vicci Mcardle, OD (Optometry) Octavia, Charlie Hamilton, MD as Consulting Physician (Ophthalmology) Rachele Gaynell RAMAN, MD as Referring Physician (Nephrology)   Chief Complaint:  Diabetes (3 month follow up )   HPI: Brooke [name] is a 69 y.o. female presenting on 03/21/2024 for Diabetes (3 month follow up )  Brooke [name] is a 69 year old female with diabetes who presents for diabetes management.  Her blood sugars have been stable without significant spikes following a recent steroid injection in her knee. Her blood sugars did not spike as she typically does when she is on prednisone . No increased hunger, thirst, or urination. She is currently using Ozempic  and has two doses remaining before transitioning to Trulicity . She is also taking Basaglar , having reduced her dose from 37 units to 24 units at times. Her morning blood sugars have improved, with no highs over 200 in the past year, and she experiences morning lows. She reports that she tries not to snack or eat after 8 PM and has also lost some weight.  Her blood pressure remains elevated despite taking lisinopril  and hydralazine . She has reduced her salt intake but still experiences high readings, particularly after consuming candy. No headaches, chest pain, or leg swelling. She has a follow up in 2 weeks with nephrology.   She mentions a constant pain in her right flank that has persisted for two to three weeks but is lightening up. She describes it as soreness rather than severe pain and denies any bladder infection symptoms. She has been drinking cranberry juice and plans to have a urine test soon. She declines a urine test today.          Relevant past medical, surgical, family, and social history reviewed and  updated as indicated.  Allergies and medications reviewed and updated. Data reviewed: Chart in Epic.   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   Hypertension    Hyperuricemia 11/27/2020   Obesity (BMI 35.0-39.9 without comorbidity)    Renal mass 08/04/2021   Vertigo    Vitamin D  insufficiency 11/27/2020    Past Surgical History:  Procedure Laterality Date   BREAST SURGERY Right 1974   OPERATIVE ULTRASOUND N/A 08/04/2021   Procedure: OPERATIVE ULTRASOUND;  Surgeon: Alvaro Hummer, MD;  Location: [facility];  Service: Urology;  Laterality: N/A;   ROBOTIC ASSITED PARTIAL NEPHRECTOMY Right 08/04/2021   Procedure: XI ROBOTIC ASSITED RADICAL NEPHRECTOMY;  Surgeon: Alvaro Hummer, MD;  Location: [facility];  Service: Urology;  Laterality: Right;    Social History   Socioeconomic History   Marital status: Married    Spouse name: Donnie   Number of children: 1   Years of education: Not on file   Highest education level: Not on file  Occupational History   Not on file  Tobacco Use   Smoking status: Never   Smokeless tobacco: Never  Vaping Use   Vaping status: Never Used  Substance and Sexual  Activity   Alcohol use: No   Drug use: No   Sexual activity: Never  Other Topics Concern   Not on file  Social History Narrative   1 daughter    1 grandson      Lives with husband    Retired    [employer]   Tobacco Use: Low Risk (03/21/2024)   Patient History    Smoking Tobacco Use: Never    Smokeless Tobacco Use: Never    Passive Exposure: Not on file  Financial Resource 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 (04/28/2023)   Overall Financial Resource Strain (CARDIA)    Difficulty of Paying Living Expenses: Not hard  at all  Food Insecurity: No Food Insecurity (04/28/2023)   Hunger Vital Sign    Worried About Running Out of Food in the Last Year: Never true    Ran Out of Food in the Last Year: Never true  Transportation Needs: No Transportation Needs (04/28/2023)   PRAPARE - Administrator, Civil Service (Medical): No    Lack of Transportation (Non-Medical): No  Physical Activity: Inactive (04/28/2023)   Exercise Vital Sign    Days of Exercise per Week: 0 days    Minutes of Exercise per Session: 0 min  Stress: No Stress Concern Present (04/28/2023)   [employer] of Occupational Health - Occupational Stress Questionnaire    Feeling of Stress : Not at all  Social Connections: Moderately Integrated (04/28/2023)   Social Connection and Isolation Panel    Frequency of Communication with Friends and Family: More than three times a week    Frequency of Social Gatherings with Friends and Family: Three times a week    Attends Religious Services: More than 4 times per year    Active Member of Clubs or Organizations: No    Attends Banker Meetings: Never    Marital Status: Married  Catering Manager Violence: Not At Risk (04/28/2023)   Humiliation, Afraid, Rape, and Kick questionnaire    Fear of Current or Ex-Partner: No    Emotionally Abused: No    Physically Abused: No    Sexually Abused: No  Depression (PHQ2-9): Low Risk (03/21/2024)   Depression (PHQ2-9)    PHQ-2 Score: 0  Alcohol Screen: Low Risk (04/28/2023)   Alcohol Screen    Last Alcohol Screening Score (AUDIT): 0  Housing: Unknown (04/28/2023)   Housing Stability Vital Sign    Unable to Pay for Housing in the Last Year: No    Number of Times Moved in the Last Year: Not on file    Homeless in the Last Year: No  Utilities: Not At Risk (04/28/2023)   [employer] Utilities    Threatened with loss of utilities: No  Health Literacy: Adequate Health Literacy (04/28/2023)   B1300 Health Literacy    Frequency of need for help with  medical instructions: Never    Outpatient Encounter Medications as of 03/21/2024  Medication Sig   acetaminophen  (TYLENOL ) 500 MG tablet Take 1,000 mg by mouth every 8 (eight) hours as needed for moderate pain.   albuterol  (PROVENTIL ) (2.5 MG/3ML) 0.083% nebulizer solution Take 3 mLs (2.5 mg total) by nebulization every 6 (six) hours as needed for wheezing or shortness of breath.   albuterol  (VENTOLIN  HFA) 108 (90 Base) MCG/ACT inhaler Inhale 2 puffs into the lungs every 6 (six) hours as needed for wheezing or shortness of breath.   amLODipine  (NORVASC ) 10 MG tablet Take 1 tablet (10 mg total) by mouth daily.  Ascorbic Acid (VITA-C PO) Take 1 tablet by mouth 2 (two) times a week.   aspirin  EC 81 MG tablet Take 1 tablet (81 mg total) by mouth daily. Swallow whole.   atenolol  (TENORMIN ) 100 MG tablet Take 1 tablet (100 mg total) by mouth every morning.   atorvastatin  (LIPITOR) 80 MG tablet Take 1 tablet (80 mg total) by mouth daily.   budesonide -glycopyrrolate-formoterol  (BREZTRI  AEROSPHERE) 160-9-4.8 MCG/ACT AERO inhaler Inhale 2 puffs into the lungs 2 (two) times daily.   calcitRIOL (ROCALTROL) 0.25 MCG capsule Take 0.25 mcg by mouth daily.   Cholecalciferol 25 MCG (1000 UT) capsule Take 1,000 Units by mouth every other day.   Continuous Glucose Receiver (FREESTYLE LIBRE 3 READER) DEVI USE AS DIRECTED   Continuous Glucose Sensor (FREESTYLE LIBRE 3 PLUS SENSOR) MISC Change sensor every 15 days. DX: E11.65   Dulaglutide  (TRULICITY ) 3 MG/0.5ML SOAJ Inject 3 mg into the skin once a week.   fluticasone  (FLONASE ) 50 MCG/ACT nasal spray Place 2 sprays into both nostrils daily.   hydrALAZINE  (APRESOLINE ) 25 MG tablet Take 1 tablet (25 mg total) by mouth 3 (three) times daily. (Patient taking differently: Take 50 mg by mouth 3 (three) times daily.)   Insulin  Glargine (BASAGLAR  KWIKPEN) 100 UNIT/ML Inject 30 Units into the skin daily.   insulin  lispro (HUMALOG  KWIKPEN) 100 UNIT/ML KwikPen Inject 5-10  units 3 times daily WITH BREAKFAST AND LUNCH and DINNER   Insulin  Pen Needle 31G X 6 MM MISC Use to inject insulin  4 times daily as directed. DX: E11.65   Lancet Device MISC Check BG prn   levocetirizine (XYZAL ) 5 MG tablet Take 1 tablet (5 mg total) by mouth every evening.   lisinopril  (ZESTRIL ) 40 MG tablet Take 1 tablet (40 mg total) by mouth daily.   Multiple Vitamin (MULTIVITAMIN ADULT PO) Take 1 tablet by mouth every other day.   Nebulizer MISC USE AS DIRECTED   No facility-administered encounter medications on file as of 03/21/2024.    Allergies[1]  Pertinent ROS per HPI, otherwise unremarkable      Objective:  BP (!) 152/84   Pulse 67   Temp (!) 97.3 F (36.3 C)   Ht 5' 5 (1.651 m)   Wt 211 lb 6.4 oz (95.9 kg)   LMP 03/16/2012   SpO2 95%   BMI 35.18 kg/m    Wt Readings from Last 3 Encounters:  03/21/24 211 lb 6.4 oz (95.9 kg)  03/12/24 214 lb 6.4 oz (97.3 kg)  02/07/24 217 lb 9.6 oz (98.7 kg)    Physical Exam Vitals and nursing note reviewed.  Constitutional:      General: She is not in acute distress.    Appearance: Normal appearance. She is well-developed and well-groomed. She is morbidly obese. She is not ill-appearing, toxic-appearing or diaphoretic.  HENT:     Head: Normocephalic and atraumatic.     Jaw: There is normal jaw occlusion.     Right Ear: Hearing normal.     Left Ear: Hearing normal.     Nose: Nose normal.     Mouth/Throat:     Lips: Pink.     Mouth: Mucous membranes are moist.     Pharynx: Oropharynx is clear. Uvula midline.  Eyes:     General: Lids are normal.     Extraocular Movements: Extraocular movements intact.     Conjunctiva/sclera: Conjunctivae normal.     Pupils: Pupils are equal, round, and reactive to light.  Neck:     Thyroid : No thyroid   mass, thyromegaly or thyroid  tenderness.     Vascular: No carotid bruit or JVD.     Trachea: Trachea and phonation normal.  Cardiovascular:     Rate and Rhythm: Normal rate and regular  rhythm.     Chest Wall: PMI is not displaced.     Pulses: Normal pulses.     Heart sounds: Normal heart sounds. No murmur heard.    No friction rub. No gallop.  Pulmonary:     Effort: Pulmonary effort is normal. No respiratory distress.     Breath sounds: Normal breath sounds. No wheezing.  Abdominal:     General: Bowel sounds are normal. There is no distension or abdominal bruit.     Palpations: Abdomen is soft. There is no hepatomegaly, splenomegaly or mass.     Tenderness: There is no abdominal tenderness. There is no right CVA tenderness, left CVA tenderness, guarding or rebound.     Hernia: No hernia is present.  Musculoskeletal:        General: Normal range of motion.     Cervical back: Normal range of motion and neck supple.     Right lower leg: No edema.     Left lower leg: No edema.  Lymphadenopathy:     Cervical: No cervical adenopathy.  Skin:    General: Skin is warm and dry.     Capillary Refill: Capillary refill takes less than 2 seconds.     Coloration: Skin is not cyanotic, jaundiced or pale.     Findings: No rash.  Neurological:     General: No focal deficit present.     Mental Status: She is alert and oriented to person, place, and time.     Sensory: Sensation is intact.     Motor: Motor function is intact.     Coordination: Coordination is intact.     Gait: Gait is intact.     Deep Tendon Reflexes: Reflexes are normal and symmetric.  Psychiatric:        Attention and Perception: Attention and perception normal.        Mood and Affect: Mood and affect normal.        Speech: Speech normal.        Behavior: Behavior normal. Behavior is cooperative.        Thought Content: Thought content normal.        Cognition and Memory: Cognition and memory normal.        Judgment: Judgment normal.       Results for orders placed or performed in visit on 02/29/24  CUP PACEART REMOTE DEVICE CHECK   Collection Time: 02/28/24 11:42 PM  Result Value Ref Range   Date  Time Interrogation Session [id number]    Pulse Generator Manufacturer MERM    Pulse Gen Model LNQ22 LINQ II    Pulse Gen Serial Number [id number]    Clinic Name [facility]    Implantable Pulse Generator Type ICM/ILR    Implantable Pulse Generator Implant Date [id number]        Pertinent labs & imaging results that were available during my care of the patient were reviewed by me and considered in my medical decision making.  Assessment & Plan:  Brooke [name] was seen today for diabetes.  Diagnoses and all orders for this visit:  Type 2 diabetes mellitus with hyperglycemia, with long-term current use of insulin  (HCC) -     Bayer DCA Hb A1c Waived -     CMP14+EGFR -  Lipid panel -     Microalbumin / creatinine urine ratio  Vitamin D  insufficiency -     VITAMIN D  25 Hydroxy (Vit-D Deficiency, Fractures) -     CMP14+EGFR  Hyperlipidemia associated with type 2 diabetes mellitus (HCC) -     Bayer DCA Hb A1c Waived -     CMP14+EGFR -     Lipid panel -     Microalbumin / creatinine urine ratio  Long-term (current) use of injectable non-insulin  antidiabetic drugs -     Bayer DCA Hb A1c Waived -     VITAMIN D  25 Hydroxy (Vit-D Deficiency, Fractures) -     CMP14+EGFR -     Lipid panel -     Microalbumin / creatinine urine ratio  Hypertension associated with diabetes (HCC) -     Bayer DCA Hb A1c Waived -     VITAMIN D  25 Hydroxy (Vit-D Deficiency, Fractures) -     CMP14+EGFR -     Lipid panel -     Microalbumin / creatinine urine ratio  Diabetic nephropathy associated with type 2 diabetes mellitus (HCC) -     Bayer DCA Hb A1c Waived -     VITAMIN D  25 Hydroxy (Vit-D Deficiency, Fractures) -     CMP14+EGFR -     Lipid panel -     Microalbumin / creatinine urine ratio  Obesity, morbid (HCC) -     Bayer DCA Hb A1c Waived -     VITAMIN D  25 Hydroxy (Vit-D Deficiency, Fractures) -     CMP14+EGFR -     Lipid panel -     Microalbumin / creatinine urine ratio  CKD (chronic  kidney disease) stage 4, GFR 15-29 ml/min (HCC) -     Bayer DCA Hb A1c Waived -     VITAMIN D  25 Hydroxy (Vit-D Deficiency, Fractures) -     CMP14+EGFR -     Microalbumin / creatinine urine ratio  History of right nephrectomy -     VITAMIN D  25 Hydroxy (Vit-D Deficiency, Fractures) -     CMP14+EGFR  History of renal cell carcinoma -     VITAMIN D  25 Hydroxy (Vit-D Deficiency, Fractures) -     CMP14+EGFR      Type 2 diabetes mellitus Well-controlled with an A1c of 6.8, improved from previous levels in the 7s. No significant blood sugar spikes after recent steroid injection. No increased hunger, thirst, or urination. Transitioning from Ozempic  to Trulicity  due to insurance coverage issues. No side effects anticipated with Trulicity  as she tolerated it well previously. - Finish remaining doses of Ozempic . - Start Trulicity  after completing Ozempic . - Monitor for any side effects with Trulicity . - Continue Basaglar  30 units daily.  Essential hypertension Blood pressure remains elevated despite current medication regimen. No symptoms such as headaches, chest pain, or leg swelling. Recent dietary changes include reduced salt intake and avoidance of late-night snacks. - Continue current antihypertensive medications (lisinopril  and hydralazine ). - Follow up with Betadine for further evaluation.  Morbid obesity Weight loss efforts are ongoing with some success. - Continue weight loss efforts.          Continue all other maintenance medications.  Follow up plan: Return in 3 months (on 06/19/2024), or if symptoms worsen or fail to improve, for DM.   Continue healthy lifestyle choices, including diet (rich in fruits, vegetables, and lean proteins, and low in salt and simple carbohydrates) and exercise (at least 30 minutes of moderate physical activity daily).  Educational handout given for DM  The above assessment and management plan was discussed with the patient. The patient  verbalized understanding of and has agreed to the management plan. Patient is aware to call the clinic if they develop any new symptoms or if symptoms persist or worsen. Patient is aware when to return to the clinic for a follow-up visit. Patient educated on when it is appropriate to go to the emergency department.   Brooke Bruns, FNP-C [facility] [phone]     [1]  Allergies Allergen Reactions   Jardiance  [Empagliflozin ]     Holding per nephro, pt reports no allergy, Dr just didn't want pt taking it   Kerendia  [Finerenone ]     Holding per nephro, pt reports no allergy, Dr just didn't want pt taking it   Metformin      Contraindicated due to GFR; GI side effects Pt states not a real allergy   Metformin  And Related     Contraindicated due to GFR; GI side effects Pt states not a real allergy   "

## 2024-03-22 ENCOUNTER — Other Ambulatory Visit (HOSPITAL_COMMUNITY): Payer: Self-pay

## 2024-03-22 ENCOUNTER — Ambulatory Visit: Payer: Self-pay | Admitting: Family Medicine

## 2024-03-22 LAB — LIPID PANEL
Chol/HDL Ratio: 3.9 ratio (ref 0.0–4.4)
Cholesterol, Total: 155 mg/dL (ref 100–199)
HDL: 40 mg/dL
LDL Chol Calc (NIH): 83 mg/dL (ref 0–99)
Triglycerides: 185 mg/dL — ABNORMAL HIGH (ref 0–149)
VLDL Cholesterol Cal: 32 mg/dL (ref 5–40)

## 2024-03-22 LAB — CMP14+EGFR
ALT: 14 IU/L (ref 0–32)
AST: 12 IU/L (ref 0–40)
Albumin: 3.9 g/dL (ref 3.9–4.9)
Alkaline Phosphatase: 110 IU/L (ref 49–135)
BUN/Creatinine Ratio: 16 (ref 12–28)
BUN: 37 mg/dL — ABNORMAL HIGH (ref 8–27)
Bilirubin Total: 0.2 mg/dL (ref 0.0–1.2)
CO2: 21 mmol/L (ref 20–29)
Calcium: 8.6 mg/dL — ABNORMAL LOW (ref 8.7–10.3)
Chloride: 105 mmol/L (ref 96–106)
Creatinine, Ser: 2.32 mg/dL — ABNORMAL HIGH (ref 0.57–1.00)
Globulin, Total: 2.5 g/dL (ref 1.5–4.5)
Glucose: 165 mg/dL — ABNORMAL HIGH (ref 70–99)
Potassium: 4.9 mmol/L (ref 3.5–5.2)
Sodium: 140 mmol/L (ref 134–144)
Total Protein: 6.4 g/dL (ref 6.0–8.5)
eGFR: 22 mL/min/1.73 — ABNORMAL LOW

## 2024-03-22 LAB — VITAMIN D 25 HYDROXY (VIT D DEFICIENCY, FRACTURES): Vit D, 25-Hydroxy: 28.3 ng/mL — ABNORMAL LOW (ref 30.0–100.0)

## 2024-03-31 ENCOUNTER — Ambulatory Visit: Attending: Cardiology

## 2024-03-31 DIAGNOSIS — I639 Cerebral infarction, unspecified: Secondary | ICD-10-CM | POA: Diagnosis not present

## 2024-04-01 ENCOUNTER — Encounter

## 2024-04-02 LAB — CUP PACEART REMOTE DEVICE CHECK
Date Time Interrogation Session: 20260124235009
Implantable Pulse Generator Implant Date: 20250519

## 2024-04-04 NOTE — Progress Notes (Signed)
 Remote Loop Recorder Transmission

## 2024-04-07 ENCOUNTER — Ambulatory Visit: Payer: Self-pay | Admitting: Cardiology

## 2024-04-30 ENCOUNTER — Ambulatory Visit: Payer: Self-pay

## 2024-05-01 ENCOUNTER — Ambulatory Visit

## 2024-06-01 ENCOUNTER — Ambulatory Visit

## 2024-06-20 ENCOUNTER — Ambulatory Visit: Admitting: Family Medicine
